# Patient Record
Sex: Female | Born: 1955 | Race: White | Hispanic: No | Marital: Single | Smoking: Never smoker
Health system: Southern US, Community
[De-identification: ages and names within clinical notes are randomized; demographics above are authoritative.]

## PROBLEM LIST (undated history)

## (undated) DIAGNOSIS — G4700 Insomnia, unspecified: Secondary | ICD-10-CM

## (undated) DIAGNOSIS — M8580 Other specified disorders of bone density and structure, unspecified site: Secondary | ICD-10-CM

## (undated) DIAGNOSIS — G039 Meningitis, unspecified: Secondary | ICD-10-CM

## (undated) DIAGNOSIS — E119 Type 2 diabetes mellitus without complications: Secondary | ICD-10-CM

## (undated) DIAGNOSIS — I10 Essential (primary) hypertension: Secondary | ICD-10-CM

## (undated) DIAGNOSIS — L409 Psoriasis, unspecified: Secondary | ICD-10-CM

## (undated) DIAGNOSIS — B20 Human immunodeficiency virus [HIV] disease: Secondary | ICD-10-CM

## (undated) DIAGNOSIS — R7303 Prediabetes: Secondary | ICD-10-CM

## (undated) DIAGNOSIS — E559 Vitamin D deficiency, unspecified: Secondary | ICD-10-CM

## (undated) DIAGNOSIS — IMO0002 Reserved for concepts with insufficient information to code with codable children: Secondary | ICD-10-CM

## (undated) DIAGNOSIS — Z21 Asymptomatic human immunodeficiency virus [HIV] infection status: Secondary | ICD-10-CM

## (undated) HISTORY — DX: Insomnia, unspecified: G47.00

## (undated) HISTORY — DX: Asymptomatic human immunodeficiency virus (hiv) infection status: Z21

## (undated) HISTORY — PX: HERNIA REPAIR: SHX51

## (undated) HISTORY — DX: Type 2 diabetes mellitus without complications: E11.9

## (undated) HISTORY — DX: Vitamin D deficiency, unspecified: E55.9

## (undated) HISTORY — DX: Human immunodeficiency virus (HIV) disease: B20

## (undated) HISTORY — DX: Psoriasis, unspecified: L40.9

## (undated) HISTORY — DX: Other specified disorders of bone density and structure, unspecified site: M85.80

## (undated) HISTORY — DX: Reserved for concepts with insufficient information to code with codable children: IMO0002

## (undated) HISTORY — DX: Essential (primary) hypertension: I10

## (undated) HISTORY — PX: CHOLECYSTECTOMY: SHX55

## (undated) HISTORY — DX: Meningitis, unspecified: G03.9

---

## 1989-06-28 ENCOUNTER — Encounter (INDEPENDENT_AMBULATORY_CARE_PROVIDER_SITE_OTHER): Payer: Self-pay | Admitting: *Deleted

## 1989-06-28 LAB — CONVERTED CEMR LAB
CD4 Count: 560 microliters
CD4 T Cell Abs: 560

## 1991-02-23 HISTORY — PX: CHOLECYSTECTOMY: SHX55

## 1997-06-24 ENCOUNTER — Encounter: Admission: RE | Admit: 1997-06-24 | Discharge: 1997-06-24 | Payer: Self-pay | Admitting: Infectious Diseases

## 1997-08-19 ENCOUNTER — Encounter: Admission: RE | Admit: 1997-08-19 | Discharge: 1997-08-19 | Payer: Self-pay | Admitting: Infectious Diseases

## 1997-10-16 ENCOUNTER — Encounter: Admission: RE | Admit: 1997-10-16 | Discharge: 1997-10-16 | Payer: Self-pay | Admitting: Infectious Diseases

## 1997-10-30 ENCOUNTER — Encounter: Admission: RE | Admit: 1997-10-30 | Discharge: 1997-10-30 | Payer: Self-pay | Admitting: Infectious Diseases

## 1997-12-25 ENCOUNTER — Ambulatory Visit (HOSPITAL_COMMUNITY): Admission: RE | Admit: 1997-12-25 | Discharge: 1997-12-25 | Payer: Self-pay | Admitting: Infectious Diseases

## 1997-12-25 ENCOUNTER — Encounter: Admission: RE | Admit: 1997-12-25 | Discharge: 1997-12-25 | Payer: Self-pay | Admitting: Infectious Diseases

## 1998-02-05 ENCOUNTER — Encounter: Admission: RE | Admit: 1998-02-05 | Discharge: 1998-02-05 | Payer: Self-pay | Admitting: Infectious Diseases

## 1998-03-24 ENCOUNTER — Encounter: Admission: RE | Admit: 1998-03-24 | Discharge: 1998-03-24 | Payer: Self-pay | Admitting: Infectious Diseases

## 1998-05-21 ENCOUNTER — Ambulatory Visit (HOSPITAL_COMMUNITY): Admission: RE | Admit: 1998-05-21 | Discharge: 1998-05-21 | Payer: Self-pay | Admitting: Infectious Diseases

## 1998-05-21 ENCOUNTER — Other Ambulatory Visit: Admission: RE | Admit: 1998-05-21 | Discharge: 1998-05-21 | Payer: Self-pay | Admitting: Infectious Diseases

## 1998-05-21 ENCOUNTER — Encounter: Admission: RE | Admit: 1998-05-21 | Discharge: 1998-05-21 | Payer: Self-pay | Admitting: Infectious Diseases

## 1998-06-30 ENCOUNTER — Encounter: Admission: RE | Admit: 1998-06-30 | Discharge: 1998-06-30 | Payer: Self-pay | Admitting: Infectious Diseases

## 1998-09-16 ENCOUNTER — Ambulatory Visit (HOSPITAL_COMMUNITY): Admission: RE | Admit: 1998-09-16 | Discharge: 1998-09-16 | Payer: Self-pay | Admitting: Infectious Diseases

## 1998-09-16 ENCOUNTER — Encounter: Admission: RE | Admit: 1998-09-16 | Discharge: 1998-09-16 | Payer: Self-pay | Admitting: Infectious Diseases

## 1998-10-15 ENCOUNTER — Encounter: Admission: RE | Admit: 1998-10-15 | Discharge: 1998-10-15 | Payer: Self-pay | Admitting: Infectious Diseases

## 1998-12-31 ENCOUNTER — Ambulatory Visit (HOSPITAL_COMMUNITY): Admission: RE | Admit: 1998-12-31 | Discharge: 1998-12-31 | Payer: Self-pay | Admitting: Infectious Diseases

## 1998-12-31 ENCOUNTER — Encounter: Admission: RE | Admit: 1998-12-31 | Discharge: 1998-12-31 | Payer: Self-pay | Admitting: Infectious Diseases

## 1999-01-12 ENCOUNTER — Encounter: Admission: RE | Admit: 1999-01-12 | Discharge: 1999-01-12 | Payer: Self-pay | Admitting: Infectious Diseases

## 1999-05-04 ENCOUNTER — Ambulatory Visit (HOSPITAL_COMMUNITY): Admission: RE | Admit: 1999-05-04 | Discharge: 1999-05-04 | Payer: Self-pay | Admitting: Infectious Diseases

## 1999-05-04 ENCOUNTER — Encounter: Admission: RE | Admit: 1999-05-04 | Discharge: 1999-05-04 | Payer: Self-pay | Admitting: Infectious Diseases

## 1999-06-10 ENCOUNTER — Encounter: Admission: RE | Admit: 1999-06-10 | Discharge: 1999-06-10 | Payer: Self-pay | Admitting: Infectious Diseases

## 1999-06-23 ENCOUNTER — Encounter: Admission: RE | Admit: 1999-06-23 | Discharge: 1999-06-23 | Payer: Self-pay

## 1999-07-07 ENCOUNTER — Ambulatory Visit (HOSPITAL_BASED_OUTPATIENT_CLINIC_OR_DEPARTMENT_OTHER): Admission: RE | Admit: 1999-07-07 | Discharge: 1999-07-07 | Payer: Self-pay

## 1999-07-17 ENCOUNTER — Inpatient Hospital Stay (HOSPITAL_COMMUNITY): Admission: AD | Admit: 1999-07-17 | Discharge: 1999-07-19 | Payer: Self-pay

## 1999-07-17 ENCOUNTER — Encounter (INDEPENDENT_AMBULATORY_CARE_PROVIDER_SITE_OTHER): Payer: Self-pay

## 1999-08-31 ENCOUNTER — Ambulatory Visit (HOSPITAL_COMMUNITY): Admission: RE | Admit: 1999-08-31 | Discharge: 1999-08-31 | Payer: Self-pay | Admitting: Infectious Diseases

## 1999-08-31 ENCOUNTER — Encounter: Admission: RE | Admit: 1999-08-31 | Discharge: 1999-08-31 | Payer: Self-pay | Admitting: Infectious Diseases

## 1999-09-14 ENCOUNTER — Encounter: Admission: RE | Admit: 1999-09-14 | Discharge: 1999-09-14 | Payer: Self-pay | Admitting: Infectious Diseases

## 1999-12-08 ENCOUNTER — Ambulatory Visit (HOSPITAL_COMMUNITY): Admission: RE | Admit: 1999-12-08 | Discharge: 1999-12-08 | Payer: Self-pay | Admitting: Infectious Diseases

## 1999-12-08 ENCOUNTER — Encounter: Admission: RE | Admit: 1999-12-08 | Discharge: 1999-12-08 | Payer: Self-pay | Admitting: Infectious Diseases

## 1999-12-28 ENCOUNTER — Encounter: Admission: RE | Admit: 1999-12-28 | Discharge: 1999-12-28 | Payer: Self-pay | Admitting: Infectious Diseases

## 2000-04-25 ENCOUNTER — Ambulatory Visit (HOSPITAL_COMMUNITY): Admission: RE | Admit: 2000-04-25 | Discharge: 2000-04-25 | Payer: Self-pay | Admitting: Infectious Diseases

## 2000-04-25 ENCOUNTER — Encounter: Admission: RE | Admit: 2000-04-25 | Discharge: 2000-04-25 | Payer: Self-pay | Admitting: Infectious Diseases

## 2000-05-09 ENCOUNTER — Encounter: Admission: RE | Admit: 2000-05-09 | Discharge: 2000-05-09 | Payer: Self-pay | Admitting: Internal Medicine

## 2000-08-24 ENCOUNTER — Ambulatory Visit (HOSPITAL_COMMUNITY): Admission: RE | Admit: 2000-08-24 | Discharge: 2000-08-24 | Payer: Self-pay | Admitting: Infectious Diseases

## 2000-08-24 ENCOUNTER — Encounter: Admission: RE | Admit: 2000-08-24 | Discharge: 2000-08-24 | Payer: Self-pay | Admitting: Infectious Diseases

## 2000-09-12 ENCOUNTER — Encounter: Admission: RE | Admit: 2000-09-12 | Discharge: 2000-09-12 | Payer: Self-pay | Admitting: Infectious Diseases

## 2000-11-07 ENCOUNTER — Encounter: Admission: RE | Admit: 2000-11-07 | Discharge: 2000-11-07 | Payer: Self-pay | Admitting: Infectious Diseases

## 2001-01-30 ENCOUNTER — Encounter: Admission: RE | Admit: 2001-01-30 | Discharge: 2001-01-30 | Payer: Self-pay | Admitting: Infectious Diseases

## 2001-01-30 ENCOUNTER — Ambulatory Visit (HOSPITAL_COMMUNITY): Admission: RE | Admit: 2001-01-30 | Discharge: 2001-01-30 | Payer: Self-pay | Admitting: Infectious Diseases

## 2001-03-13 ENCOUNTER — Encounter: Admission: RE | Admit: 2001-03-13 | Discharge: 2001-03-13 | Payer: Self-pay | Admitting: Infectious Diseases

## 2001-04-17 ENCOUNTER — Encounter: Admission: RE | Admit: 2001-04-17 | Discharge: 2001-04-17 | Payer: Self-pay | Admitting: Infectious Diseases

## 2001-06-26 ENCOUNTER — Encounter: Admission: RE | Admit: 2001-06-26 | Discharge: 2001-06-26 | Payer: Self-pay | Admitting: Infectious Diseases

## 2001-06-26 ENCOUNTER — Ambulatory Visit (HOSPITAL_COMMUNITY): Admission: RE | Admit: 2001-06-26 | Discharge: 2001-06-26 | Payer: Self-pay | Admitting: Infectious Diseases

## 2001-07-17 ENCOUNTER — Encounter: Admission: RE | Admit: 2001-07-17 | Discharge: 2001-07-17 | Payer: Self-pay | Admitting: Infectious Diseases

## 2001-08-29 ENCOUNTER — Encounter: Admission: RE | Admit: 2001-08-29 | Discharge: 2001-08-29 | Payer: Self-pay | Admitting: Internal Medicine

## 2001-09-18 ENCOUNTER — Encounter: Admission: RE | Admit: 2001-09-18 | Discharge: 2001-09-18 | Payer: Self-pay | Admitting: Infectious Diseases

## 2001-12-11 ENCOUNTER — Ambulatory Visit (HOSPITAL_COMMUNITY): Admission: RE | Admit: 2001-12-11 | Discharge: 2001-12-11 | Payer: Self-pay | Admitting: Infectious Diseases

## 2001-12-11 ENCOUNTER — Encounter: Admission: RE | Admit: 2001-12-11 | Discharge: 2001-12-11 | Payer: Self-pay | Admitting: Infectious Diseases

## 2001-12-25 ENCOUNTER — Encounter: Admission: RE | Admit: 2001-12-25 | Discharge: 2001-12-25 | Payer: Self-pay | Admitting: Infectious Diseases

## 2002-04-12 ENCOUNTER — Encounter: Admission: RE | Admit: 2002-04-12 | Discharge: 2002-04-12 | Payer: Self-pay | Admitting: Infectious Diseases

## 2002-04-30 ENCOUNTER — Encounter: Admission: RE | Admit: 2002-04-30 | Discharge: 2002-04-30 | Payer: Self-pay | Admitting: Infectious Diseases

## 2002-07-06 ENCOUNTER — Encounter: Admission: RE | Admit: 2002-07-06 | Discharge: 2002-07-06 | Payer: Self-pay | Admitting: Infectious Diseases

## 2002-07-06 ENCOUNTER — Encounter: Payer: Self-pay | Admitting: Infectious Diseases

## 2002-07-17 ENCOUNTER — Encounter: Admission: RE | Admit: 2002-07-17 | Discharge: 2002-07-17 | Payer: Self-pay | Admitting: Infectious Diseases

## 2002-07-17 ENCOUNTER — Encounter (INDEPENDENT_AMBULATORY_CARE_PROVIDER_SITE_OTHER): Payer: Self-pay | Admitting: Infectious Diseases

## 2002-07-30 ENCOUNTER — Encounter: Admission: RE | Admit: 2002-07-30 | Discharge: 2002-07-30 | Payer: Self-pay | Admitting: Infectious Diseases

## 2002-08-18 ENCOUNTER — Inpatient Hospital Stay (HOSPITAL_COMMUNITY): Admission: EM | Admit: 2002-08-18 | Discharge: 2002-08-20 | Payer: Self-pay

## 2002-10-30 ENCOUNTER — Encounter (INDEPENDENT_AMBULATORY_CARE_PROVIDER_SITE_OTHER): Payer: Self-pay | Admitting: Infectious Diseases

## 2002-10-30 ENCOUNTER — Ambulatory Visit (HOSPITAL_COMMUNITY): Admission: RE | Admit: 2002-10-30 | Discharge: 2002-10-30 | Payer: Self-pay | Admitting: Infectious Diseases

## 2002-10-30 ENCOUNTER — Encounter: Admission: RE | Admit: 2002-10-30 | Discharge: 2002-10-30 | Payer: Self-pay | Admitting: Infectious Diseases

## 2002-11-19 ENCOUNTER — Encounter: Admission: RE | Admit: 2002-11-19 | Discharge: 2002-11-19 | Payer: Self-pay | Admitting: Infectious Diseases

## 2003-03-01 ENCOUNTER — Encounter: Admission: RE | Admit: 2003-03-01 | Discharge: 2003-03-01 | Payer: Self-pay | Admitting: Infectious Diseases

## 2003-03-18 ENCOUNTER — Encounter: Admission: RE | Admit: 2003-03-18 | Discharge: 2003-03-18 | Payer: Self-pay | Admitting: Infectious Diseases

## 2003-07-08 ENCOUNTER — Encounter: Admission: RE | Admit: 2003-07-08 | Discharge: 2003-07-08 | Payer: Self-pay | Admitting: Infectious Diseases

## 2003-07-22 ENCOUNTER — Encounter: Admission: RE | Admit: 2003-07-22 | Discharge: 2003-07-22 | Payer: Self-pay | Admitting: Infectious Diseases

## 2003-11-18 ENCOUNTER — Ambulatory Visit: Payer: Self-pay | Admitting: Infectious Diseases

## 2003-11-18 ENCOUNTER — Ambulatory Visit (HOSPITAL_COMMUNITY): Admission: RE | Admit: 2003-11-18 | Discharge: 2003-11-18 | Payer: Self-pay | Admitting: Infectious Diseases

## 2003-12-16 ENCOUNTER — Ambulatory Visit: Payer: Self-pay | Admitting: Infectious Diseases

## 2004-01-15 ENCOUNTER — Ambulatory Visit: Payer: Self-pay | Admitting: Infectious Diseases

## 2004-03-16 ENCOUNTER — Ambulatory Visit (HOSPITAL_COMMUNITY): Admission: RE | Admit: 2004-03-16 | Discharge: 2004-03-16 | Payer: Self-pay | Admitting: Infectious Diseases

## 2004-03-16 ENCOUNTER — Ambulatory Visit: Payer: Self-pay | Admitting: Infectious Diseases

## 2004-04-06 ENCOUNTER — Ambulatory Visit: Payer: Self-pay | Admitting: Infectious Diseases

## 2004-06-23 ENCOUNTER — Ambulatory Visit: Payer: Self-pay | Admitting: Infectious Diseases

## 2004-07-21 ENCOUNTER — Ambulatory Visit: Payer: Self-pay | Admitting: Infectious Diseases

## 2004-07-21 ENCOUNTER — Ambulatory Visit (HOSPITAL_COMMUNITY): Admission: RE | Admit: 2004-07-21 | Discharge: 2004-07-21 | Payer: Self-pay | Admitting: Infectious Diseases

## 2004-08-10 ENCOUNTER — Ambulatory Visit: Payer: Self-pay | Admitting: Infectious Diseases

## 2004-12-14 ENCOUNTER — Ambulatory Visit: Payer: Self-pay | Admitting: Infectious Diseases

## 2004-12-14 ENCOUNTER — Ambulatory Visit (HOSPITAL_COMMUNITY): Admission: RE | Admit: 2004-12-14 | Discharge: 2004-12-14 | Payer: Self-pay | Admitting: Infectious Diseases

## 2004-12-14 ENCOUNTER — Encounter (INDEPENDENT_AMBULATORY_CARE_PROVIDER_SITE_OTHER): Payer: Self-pay | Admitting: *Deleted

## 2004-12-14 LAB — CONVERTED CEMR LAB
CD4 Count: 930 microliters
HIV 1 RNA Quant: 49 copies/mL

## 2004-12-28 ENCOUNTER — Ambulatory Visit: Payer: Self-pay | Admitting: Infectious Diseases

## 2005-05-31 ENCOUNTER — Encounter (INDEPENDENT_AMBULATORY_CARE_PROVIDER_SITE_OTHER): Payer: Self-pay | Admitting: *Deleted

## 2005-05-31 ENCOUNTER — Encounter: Admission: RE | Admit: 2005-05-31 | Discharge: 2005-05-31 | Payer: Self-pay | Admitting: Infectious Diseases

## 2005-05-31 ENCOUNTER — Ambulatory Visit: Payer: Self-pay | Admitting: Infectious Diseases

## 2005-05-31 LAB — CONVERTED CEMR LAB
CD4 Count: 760 microliters
HIV 1 RNA Quant: 49 copies/mL

## 2005-06-21 ENCOUNTER — Ambulatory Visit: Payer: Self-pay | Admitting: Infectious Diseases

## 2005-11-01 ENCOUNTER — Encounter: Admission: RE | Admit: 2005-11-01 | Discharge: 2005-11-01 | Payer: Self-pay | Admitting: Infectious Diseases

## 2005-11-01 ENCOUNTER — Ambulatory Visit: Payer: Self-pay | Admitting: Infectious Diseases

## 2005-11-01 ENCOUNTER — Encounter (INDEPENDENT_AMBULATORY_CARE_PROVIDER_SITE_OTHER): Payer: Self-pay | Admitting: *Deleted

## 2005-11-01 LAB — CONVERTED CEMR LAB
CD4 Count: 730 microliters
HIV 1 RNA Quant: 91 copies/mL

## 2005-11-22 ENCOUNTER — Ambulatory Visit: Payer: Self-pay | Admitting: Infectious Diseases

## 2005-11-24 DIAGNOSIS — B20 Human immunodeficiency virus [HIV] disease: Secondary | ICD-10-CM | POA: Insufficient documentation

## 2005-11-25 DIAGNOSIS — B372 Candidiasis of skin and nail: Secondary | ICD-10-CM | POA: Insufficient documentation

## 2005-11-25 DIAGNOSIS — M549 Dorsalgia, unspecified: Secondary | ICD-10-CM | POA: Insufficient documentation

## 2005-11-25 DIAGNOSIS — E669 Obesity, unspecified: Secondary | ICD-10-CM | POA: Insufficient documentation

## 2006-04-18 ENCOUNTER — Ambulatory Visit: Payer: Self-pay | Admitting: Infectious Diseases

## 2006-04-18 ENCOUNTER — Encounter: Admission: RE | Admit: 2006-04-18 | Discharge: 2006-04-18 | Payer: Self-pay | Admitting: Infectious Diseases

## 2006-04-18 ENCOUNTER — Encounter (INDEPENDENT_AMBULATORY_CARE_PROVIDER_SITE_OTHER): Payer: Self-pay | Admitting: *Deleted

## 2006-04-18 LAB — CONVERTED CEMR LAB
ALT: 17 units/L (ref 0–35)
AST: 15 units/L (ref 0–37)
Albumin: 4.3 g/dL (ref 3.5–5.2)
Alkaline Phosphatase: 114 units/L (ref 39–117)
BUN: 18 mg/dL (ref 6–23)
Basophils Absolute: 0 10*3/uL (ref 0.0–0.1)
Basophils Relative: 0 % (ref 0–1)
Bilirubin Urine: NEGATIVE
CD4 % Helper T Cell: 45 %
CD4 Count: 790 microliters
CO2: 28 meq/L (ref 19–32)
Calcium: 9.2 mg/dL (ref 8.4–10.5)
Chloride: 103 meq/L (ref 96–112)
Cholesterol: 200 mg/dL (ref 0–200)
Creatinine, Ser: 0.71 mg/dL (ref 0.40–1.20)
Eosinophils Absolute: 0.2 10*3/uL (ref 0.0–0.7)
Eosinophils Relative: 3 % (ref 0–5)
Glucose, Bld: 104 mg/dL — ABNORMAL HIGH (ref 70–99)
HCT: 40.2 % (ref 36.0–46.0)
HDL: 59 mg/dL (ref >39)
HIV 1 RNA Quant: 93 copies/mL — ABNORMAL HIGH (ref <50)
HIV-1 RNA Quant, Log: 1.97 — ABNORMAL HIGH (ref <1.70)
Hemoglobin, Urine: NEGATIVE
Hemoglobin: 12.7 g/dL (ref 12.0–15.0)
Ketones, ur: NEGATIVE mg/dL
LDL Cholesterol: 131 mg/dL — ABNORMAL HIGH (ref 0–99)
Leukocytes, UA: NEGATIVE
Lymphocytes Relative: 27 % (ref 12–46)
Lymphs Abs: 1.8 10*3/uL (ref 0.7–3.3)
MCHC: 31.6 g/dL (ref 30.0–36.0)
MCV: 92.8 fL (ref 78.0–100.0)
Monocytes Absolute: 0.5 10*3/uL (ref 0.2–0.7)
Monocytes Relative: 8 % (ref 3–11)
Neutro Abs: 4.3 10*3/uL (ref 1.7–7.7)
Neutrophils Relative %: 62 % (ref 43–77)
Nitrite: NEGATIVE
Pap Smear: NORMAL
Platelets: 298 10*3/uL (ref 150–400)
Potassium: 4.7 meq/L (ref 3.5–5.3)
Protein, ur: NEGATIVE mg/dL
RBC: 4.33 M/uL (ref 3.87–5.11)
RDW: 13.3 % (ref 11.5–14.0)
Sodium: 139 meq/L (ref 135–145)
Specific Gravity, Urine: 1.002 — ABNORMAL LOW (ref 1.005–1.03)
Total Bilirubin: 0.3 mg/dL (ref 0.3–1.2)
Total CHOL/HDL Ratio: 3.4
Total Protein: 7.3 g/dL (ref 6.0–8.3)
Triglycerides: 50 mg/dL (ref <150)
Urine Glucose: NEGATIVE mg/dL
Urobilinogen, UA: 0.2 (ref 0.0–1.0)
VLDL: 10 mg/dL (ref 0–40)
WBC: 6.9 10*3/uL (ref 4.0–10.5)
pH: 7 (ref 5.0–8.0)

## 2006-05-01 ENCOUNTER — Encounter (INDEPENDENT_AMBULATORY_CARE_PROVIDER_SITE_OTHER): Payer: Self-pay | Admitting: *Deleted

## 2006-05-02 ENCOUNTER — Ambulatory Visit: Payer: Self-pay | Admitting: Infectious Diseases

## 2006-08-08 ENCOUNTER — Ambulatory Visit: Payer: Self-pay | Admitting: Infectious Diseases

## 2006-11-11 ENCOUNTER — Encounter: Admission: RE | Admit: 2006-11-11 | Discharge: 2006-11-11 | Payer: Self-pay | Admitting: Internal Medicine

## 2006-11-11 ENCOUNTER — Ambulatory Visit: Payer: Self-pay | Admitting: Internal Medicine

## 2006-11-11 LAB — CONVERTED CEMR LAB
ALT: 19 units/L (ref 0–35)
AST: 16 units/L (ref 0–37)
Albumin: 3.7 g/dL (ref 3.5–5.2)
Alkaline Phosphatase: 100 units/L (ref 39–117)
BUN: 16 mg/dL (ref 6–23)
Basophils Absolute: 0 10*3/uL (ref 0.0–0.1)
Basophils Relative: 0 % (ref 0–1)
CO2: 24 meq/L (ref 19–32)
Calcium: 8.9 mg/dL (ref 8.4–10.5)
Chloride: 104 meq/L (ref 96–112)
Creatinine, Ser: 0.62 mg/dL (ref 0.40–1.20)
Eosinophils Absolute: 0.2 10*3/uL (ref 0.0–0.7)
Eosinophils Relative: 2 % (ref 0–5)
Glucose, Bld: 130 mg/dL — ABNORMAL HIGH (ref 70–99)
HCT: 39.3 % (ref 36.0–46.0)
HIV 1 RNA Quant: <50 copies/mL (ref <50)
HIV-1 RNA Quant, Log: <1.7 (ref <1.70)
Hemoglobin: 12.6 g/dL (ref 12.0–15.0)
Lymphocytes Relative: 21 % (ref 12–46)
Lymphs Abs: 1.7 10*3/uL (ref 0.7–3.3)
MCHC: 32.1 g/dL (ref 30.0–36.0)
MCV: 92 fL (ref 78.0–100.0)
Monocytes Absolute: 0.6 10*3/uL (ref 0.2–0.7)
Monocytes Relative: 7 % (ref 3–11)
Neutro Abs: 5.5 10*3/uL (ref 1.7–7.7)
Neutrophils Relative %: 69 % (ref 43–77)
Platelets: 280 10*3/uL (ref 150–400)
Potassium: 4.3 meq/L (ref 3.5–5.3)
RBC: 4.27 M/uL (ref 3.87–5.11)
RDW: 14.5 % — ABNORMAL HIGH (ref 11.5–14.0)
Sodium: 140 meq/L (ref 135–145)
Total Bilirubin: 0.3 mg/dL (ref 0.3–1.2)
Total Protein: 6.7 g/dL (ref 6.0–8.3)
WBC: 7.9 10*3/uL (ref 4.0–10.5)

## 2006-11-28 ENCOUNTER — Ambulatory Visit: Payer: Self-pay | Admitting: Infectious Disease

## 2006-11-28 DIAGNOSIS — L408 Other psoriasis: Secondary | ICD-10-CM | POA: Insufficient documentation

## 2006-12-02 ENCOUNTER — Encounter: Payer: Self-pay | Admitting: Infectious Disease

## 2006-12-22 ENCOUNTER — Encounter: Payer: Self-pay | Admitting: Infectious Disease

## 2007-02-06 ENCOUNTER — Encounter (INDEPENDENT_AMBULATORY_CARE_PROVIDER_SITE_OTHER): Payer: Self-pay | Admitting: *Deleted

## 2007-02-21 ENCOUNTER — Encounter: Payer: Self-pay | Admitting: Infectious Disease

## 2007-02-23 DIAGNOSIS — IMO0002 Reserved for concepts with insufficient information to code with codable children: Secondary | ICD-10-CM

## 2007-02-23 DIAGNOSIS — R87619 Unspecified abnormal cytological findings in specimens from cervix uteri: Secondary | ICD-10-CM

## 2007-02-23 HISTORY — DX: Unspecified abnormal cytological findings in specimens from cervix uteri: R87.619

## 2007-02-23 HISTORY — DX: Reserved for concepts with insufficient information to code with codable children: IMO0002

## 2007-05-16 ENCOUNTER — Encounter: Admission: RE | Admit: 2007-05-16 | Discharge: 2007-05-16 | Payer: Self-pay | Admitting: Infectious Disease

## 2007-05-16 ENCOUNTER — Ambulatory Visit: Payer: Self-pay | Admitting: Infectious Disease

## 2007-05-16 LAB — CONVERTED CEMR LAB
ALT: 25 units/L (ref 0–35)
AST: 23 units/L (ref 0–37)
Albumin: 4.3 g/dL (ref 3.5–5.2)
Alkaline Phosphatase: 121 units/L — ABNORMAL HIGH (ref 39–117)
BUN: 11 mg/dL (ref 6–23)
CO2: 25 meq/L (ref 19–32)
Calcium: 9.3 mg/dL (ref 8.4–10.5)
Chloride: 104 meq/L (ref 96–112)
Cholesterol: 173 mg/dL (ref 0–200)
Creatinine, Ser: 0.58 mg/dL (ref 0.40–1.20)
Glucose, Bld: 103 mg/dL — ABNORMAL HIGH (ref 70–99)
HCT: 39.3 % (ref 36.0–46.0)
HDL: 49 mg/dL (ref >39)
HIV 1 RNA Quant: 156 copies/mL — ABNORMAL HIGH (ref <50)
HIV-1 RNA Quant, Log: 2.19 — ABNORMAL HIGH (ref <1.70)
Hemoglobin: 13 g/dL (ref 12.0–15.0)
LDL Cholesterol: 114 mg/dL — ABNORMAL HIGH (ref 0–99)
MCHC: 33.1 g/dL (ref 30.0–36.0)
MCV: 92.7 fL (ref 78.0–100.0)
Platelets: 310 10*3/uL (ref 150–400)
Potassium: 4.7 meq/L (ref 3.5–5.3)
RBC: 4.24 M/uL (ref 3.87–5.11)
RDW: 14.3 % (ref 11.5–15.5)
Sodium: 142 meq/L (ref 135–145)
Total Bilirubin: 0.3 mg/dL (ref 0.3–1.2)
Total CHOL/HDL Ratio: 3.5
Total Protein: 6.9 g/dL (ref 6.0–8.3)
Triglycerides: 48 mg/dL (ref <150)
VLDL: 10 mg/dL (ref 0–40)
WBC: 7.3 10*3/uL (ref 4.0–10.5)

## 2007-05-29 ENCOUNTER — Ambulatory Visit: Payer: Self-pay | Admitting: Infectious Disease

## 2007-06-09 ENCOUNTER — Encounter: Payer: Self-pay | Admitting: Infectious Disease

## 2007-06-09 ENCOUNTER — Ambulatory Visit: Payer: Self-pay | Admitting: Infectious Disease

## 2007-06-20 DIAGNOSIS — R87612 Low grade squamous intraepithelial lesion on cytologic smear of cervix (LGSIL): Secondary | ICD-10-CM | POA: Insufficient documentation

## 2007-06-23 ENCOUNTER — Encounter: Payer: Self-pay | Admitting: Infectious Disease

## 2007-06-30 ENCOUNTER — Ambulatory Visit: Payer: Self-pay | Admitting: *Deleted

## 2007-06-30 ENCOUNTER — Other Ambulatory Visit: Admission: RE | Admit: 2007-06-30 | Discharge: 2007-06-30 | Payer: Self-pay | Admitting: Obstetrics & Gynecology

## 2007-06-30 ENCOUNTER — Encounter: Payer: Self-pay | Admitting: Infectious Disease

## 2007-07-14 ENCOUNTER — Ambulatory Visit: Payer: Self-pay | Admitting: Gynecology

## 2007-11-20 ENCOUNTER — Ambulatory Visit: Payer: Self-pay | Admitting: Infectious Disease

## 2007-11-20 LAB — CONVERTED CEMR LAB
ALT: 37 units/L — ABNORMAL HIGH (ref 0–35)
AST: 31 units/L (ref 0–37)
Albumin: 4.6 g/dL (ref 3.5–5.2)
Alkaline Phosphatase: 130 units/L — ABNORMAL HIGH (ref 39–117)
BUN: 14 mg/dL (ref 6–23)
Basophils Absolute: 0 10*3/uL (ref 0.0–0.1)
Basophils Relative: 1 % (ref 0–1)
CO2: 26 meq/L (ref 19–32)
Calcium: 9.6 mg/dL (ref 8.4–10.5)
Chloride: 102 meq/L (ref 96–112)
Cholesterol: 191 mg/dL (ref 0–200)
Creatinine, Ser: 0.9 mg/dL (ref 0.40–1.20)
Eosinophils Absolute: 0.1 10*3/uL (ref 0.0–0.7)
Eosinophils Relative: 2 % (ref 0–5)
Glucose, Bld: 108 mg/dL — ABNORMAL HIGH (ref 70–99)
HCT: 41.8 % (ref 36.0–46.0)
HDL: 49 mg/dL (ref >39)
HIV 1 RNA Quant: 141 copies/mL — ABNORMAL HIGH (ref <50)
HIV-1 RNA Quant, Log: 2.15 — ABNORMAL HIGH (ref <1.70)
Hemoglobin: 14 g/dL (ref 12.0–15.0)
Hep A Total Ab: NEGATIVE
Hgb A1c MFr Bld: 5.2 %
LDL Cholesterol: 129 mg/dL — ABNORMAL HIGH (ref 0–99)
Lymphocytes Relative: 29 % (ref 12–46)
Lymphs Abs: 1.8 10*3/uL (ref 0.7–4.0)
MCHC: 33.5 g/dL (ref 30.0–36.0)
MCV: 95 fL (ref 78.0–100.0)
Monocytes Absolute: 0.3 10*3/uL (ref 0.1–1.0)
Monocytes Relative: 6 % (ref 3–12)
Neutro Abs: 3.8 10*3/uL (ref 1.7–7.7)
Neutrophils Relative %: 62 % (ref 43–77)
Platelets: 318 10*3/uL (ref 150–400)
Potassium: 4.3 meq/L (ref 3.5–5.3)
RBC: 4.4 M/uL (ref 3.87–5.11)
RDW: 13.4 % (ref 11.5–15.5)
Sodium: 140 meq/L (ref 135–145)
Total Bilirubin: 0.4 mg/dL (ref 0.3–1.2)
Total CHOL/HDL Ratio: 3.9
Total Protein: 7.5 g/dL (ref 6.0–8.3)
Triglycerides: 63 mg/dL (ref <150)
VLDL: 13 mg/dL (ref 0–40)
WBC: 6.1 10*3/uL (ref 4.0–10.5)

## 2007-12-05 ENCOUNTER — Ambulatory Visit: Payer: Self-pay | Admitting: Infectious Diseases

## 2008-01-26 ENCOUNTER — Ambulatory Visit: Payer: Self-pay | Admitting: Obstetrics and Gynecology

## 2008-01-26 ENCOUNTER — Encounter (INDEPENDENT_AMBULATORY_CARE_PROVIDER_SITE_OTHER): Payer: Self-pay | Admitting: Family Medicine

## 2008-03-07 ENCOUNTER — Ambulatory Visit (HOSPITAL_COMMUNITY): Admission: RE | Admit: 2008-03-07 | Discharge: 2008-03-07 | Payer: Self-pay | Admitting: Obstetrics & Gynecology

## 2008-03-07 IMAGING — MG MM DIGITAL SCREENING BILAT
5 series · 5 of 5 positions shown · non-contrast
Comparison: none

DG SCREEN MAMMOGRAM BILATERAL
Bilateral CC and MLO view(s) were taken.
Technologist: NATLVLDAD.(NATLVLDAD)(M)

DIGITAL SCREENING MAMMOGRAM WITH CAD:
There are scattered fibroglandular densities.  No masses or malignant type calcifications are 
identified.

[R CC]
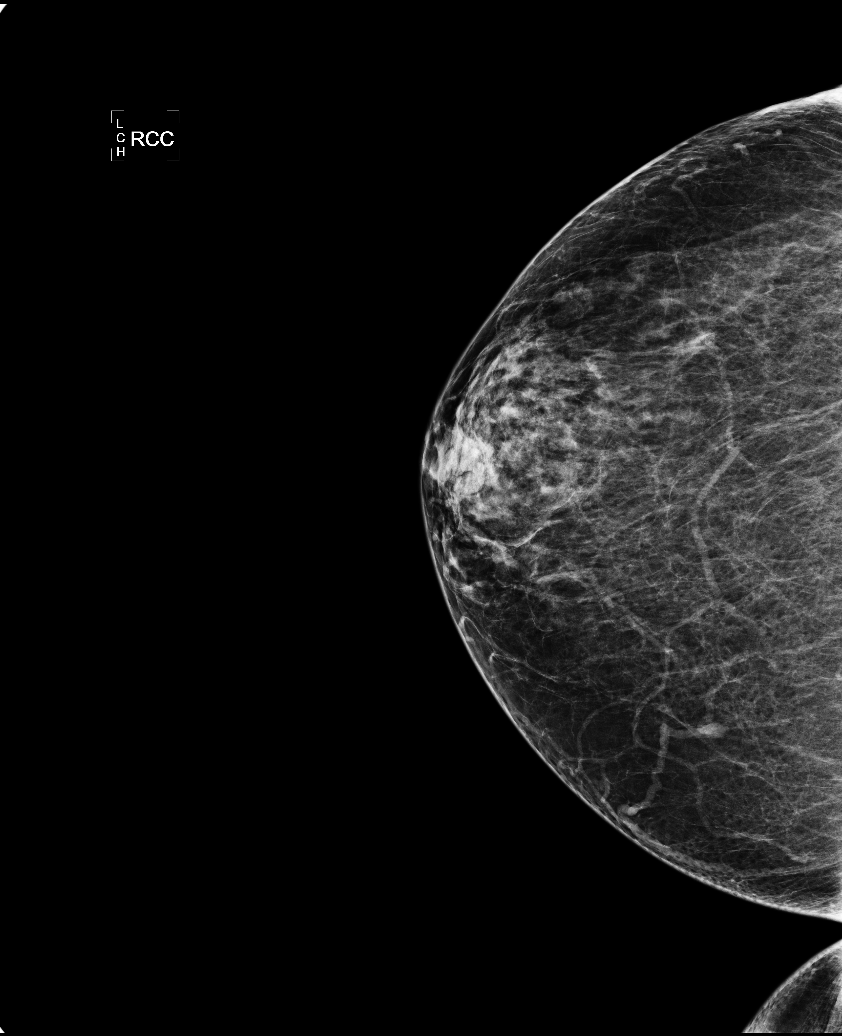

[R MLO (1 of 2)]
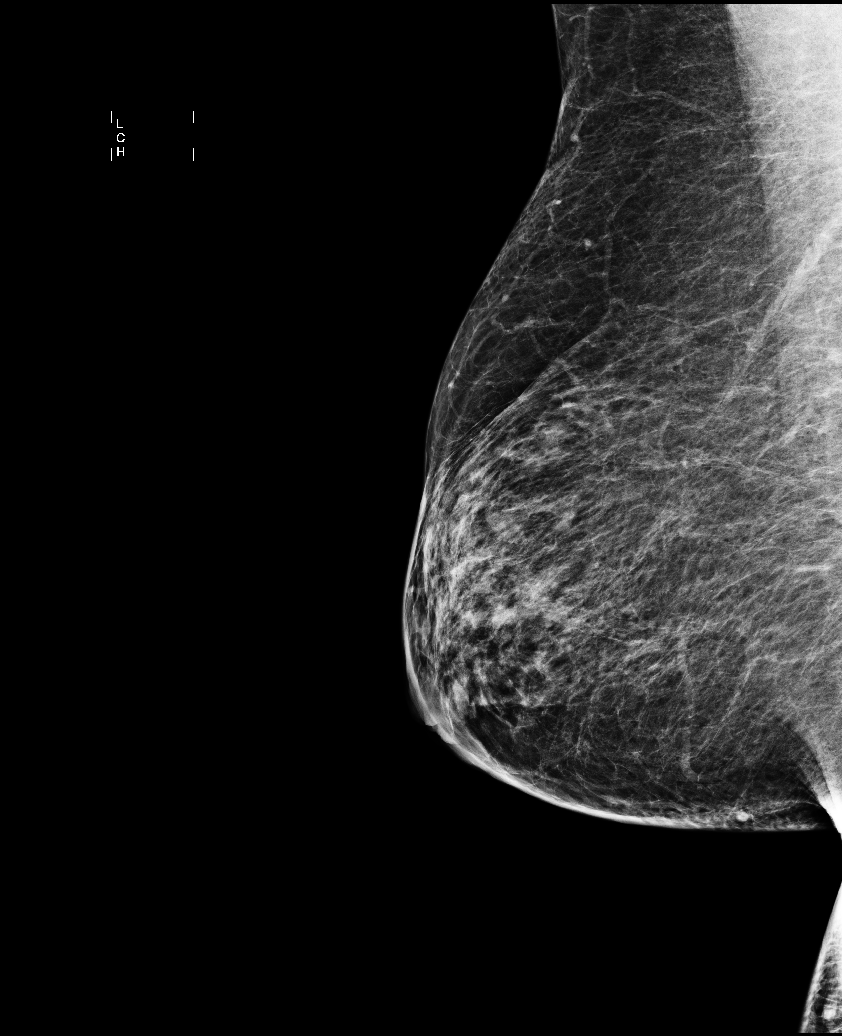

[L CC]
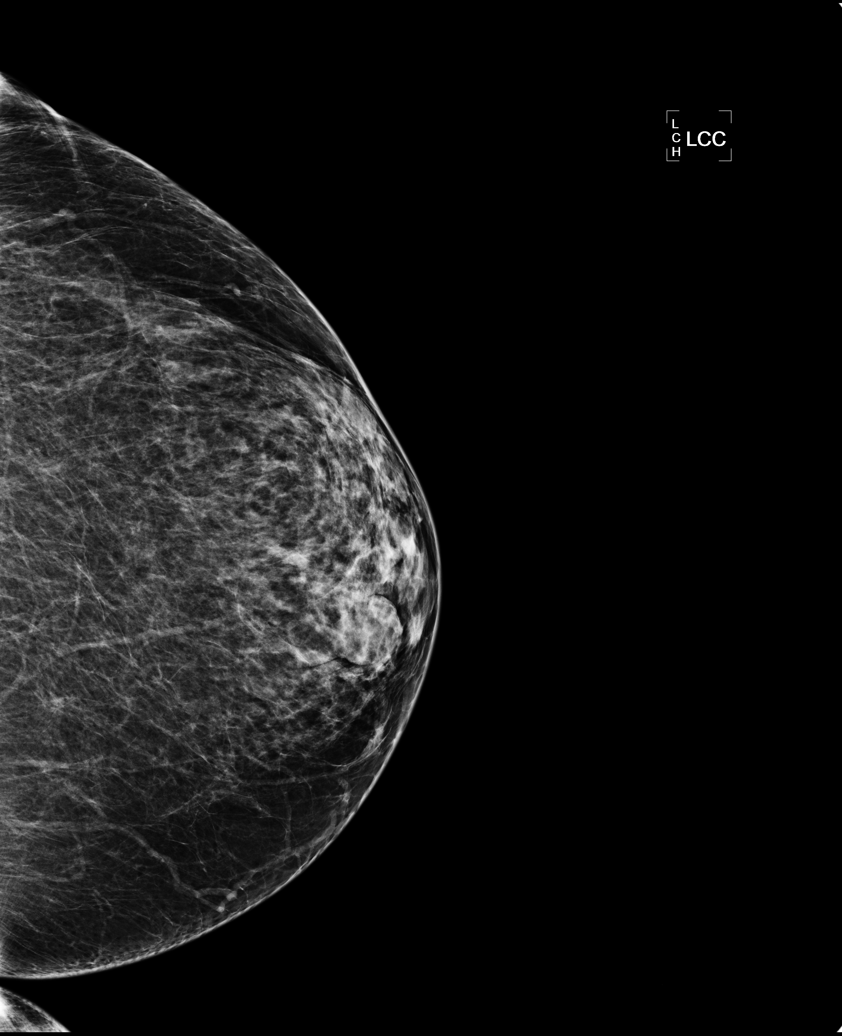

[L MLO]
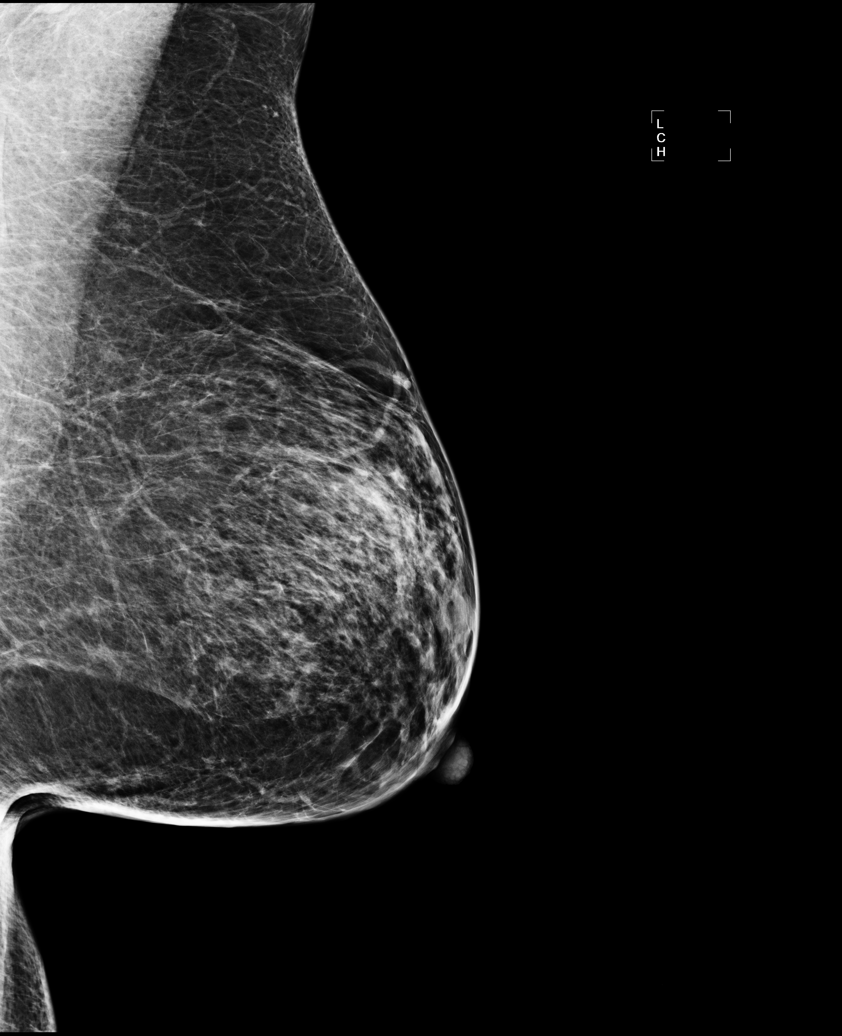

[R MLO (2 of 2)]
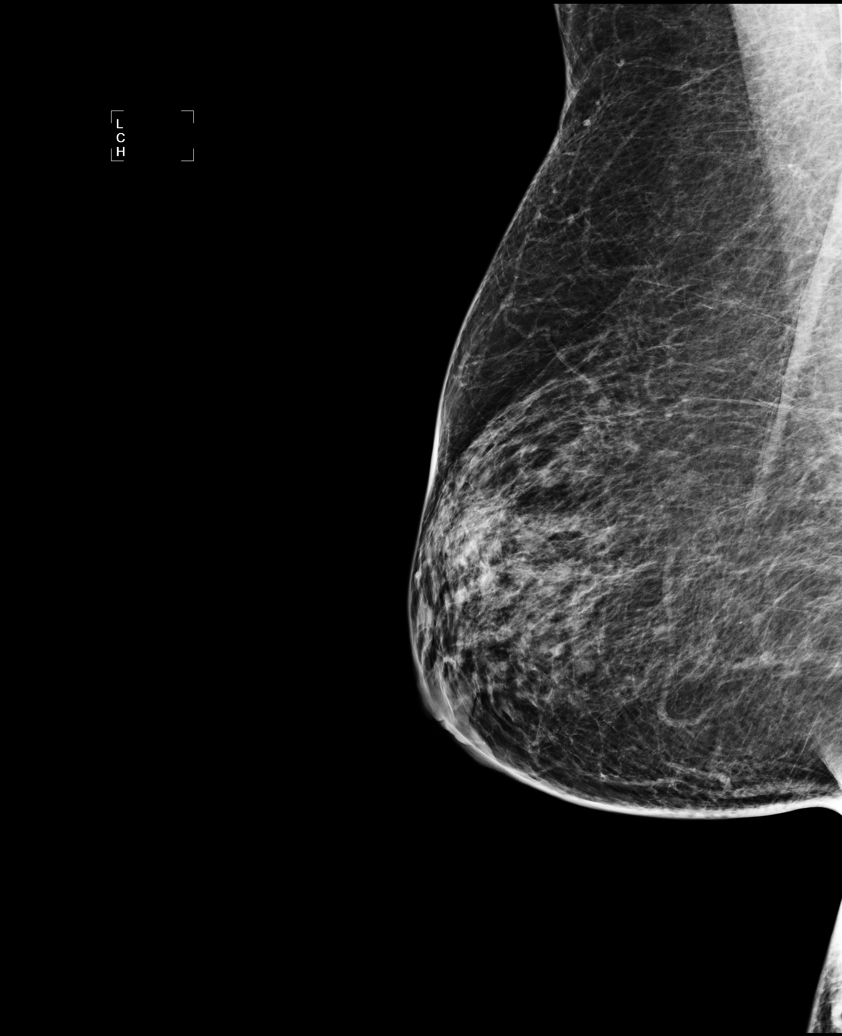

[5 of 5 positions shown; findings below may reference images not displayed]

IMPRESSION: No specific mammographic evidence of malignancy.  Next screening mammogram is recommended in one 
year.

ASSESSMENT: Negative - BI-RADS 1

Screening mammogram in 1 year.
ANALYZED BY COMPUTER AIDED DETECTION. , THIS PROCEDURE WAS A DIGITAL MAMMOGRAM.

## 2008-05-28 ENCOUNTER — Ambulatory Visit: Payer: Self-pay | Admitting: Infectious Diseases

## 2008-05-28 LAB — CONVERTED CEMR LAB
ALT: 44 units/L — ABNORMAL HIGH (ref 0–35)
AST: 36 units/L (ref 0–37)
Albumin: 4.4 g/dL (ref 3.5–5.2)
Alkaline Phosphatase: 97 units/L (ref 39–117)
BUN: 8 mg/dL (ref 6–23)
Basophils Absolute: 0 10*3/uL (ref 0.0–0.1)
Basophils Relative: 1 % (ref 0–1)
CO2: 27 meq/L (ref 19–32)
Calcium: 9.5 mg/dL (ref 8.4–10.5)
Chloride: 101 meq/L (ref 96–112)
Creatinine, Ser: 0.76 mg/dL (ref 0.40–1.20)
Eosinophils Absolute: 0.1 10*3/uL (ref 0.0–0.7)
Eosinophils Relative: 2 % (ref 0–5)
GFR calc Af Amer: >60 mL/min (ref >60)
GFR calc non Af Amer: >60 mL/min (ref >60)
Glucose, Bld: 90 mg/dL (ref 70–99)
HCT: 39.3 % (ref 36.0–46.0)
HIV 1 RNA Quant: 180 copies/mL — ABNORMAL HIGH (ref <48)
HIV-1 RNA Quant, Log: 2.26 — ABNORMAL HIGH (ref <1.68)
Hemoglobin: 12.8 g/dL (ref 12.0–15.0)
Lymphocytes Relative: 26 % (ref 12–46)
Lymphs Abs: 1.2 10*3/uL (ref 0.7–4.0)
MCHC: 32.6 g/dL (ref 30.0–36.0)
MCV: 93.8 fL (ref 78.0–100.0)
Monocytes Absolute: 0.2 10*3/uL (ref 0.1–1.0)
Monocytes Relative: 5 % (ref 3–12)
Neutro Abs: 3.1 10*3/uL (ref 1.7–7.7)
Neutrophils Relative %: 67 % (ref 43–77)
Platelets: 271 10*3/uL (ref 150–400)
Potassium: 4.7 meq/L (ref 3.5–5.3)
RBC: 4.19 M/uL (ref 3.87–5.11)
RDW: 13.1 % (ref 11.5–15.5)
Sodium: 138 meq/L (ref 135–145)
Total Bilirubin: 0.4 mg/dL (ref 0.3–1.2)
Total Protein: 7 g/dL (ref 6.0–8.3)
WBC: 4.7 10*3/uL (ref 4.0–10.5)

## 2008-06-11 ENCOUNTER — Ambulatory Visit: Payer: Self-pay | Admitting: Infectious Diseases

## 2008-06-11 LAB — CONVERTED CEMR LAB
Cholesterol, target level: 200 mg/dL
HDL goal, serum: 40 mg/dL
LDL Goal: 160 mg/dL

## 2008-06-27 ENCOUNTER — Encounter: Payer: Self-pay | Admitting: Infectious Disease

## 2008-07-05 ENCOUNTER — Telehealth: Payer: Self-pay | Admitting: Infectious Diseases

## 2008-08-15 ENCOUNTER — Encounter (INDEPENDENT_AMBULATORY_CARE_PROVIDER_SITE_OTHER): Payer: Self-pay | Admitting: *Deleted

## 2008-08-15 ENCOUNTER — Encounter: Payer: Self-pay | Admitting: Obstetrics & Gynecology

## 2008-08-15 ENCOUNTER — Ambulatory Visit: Payer: Self-pay | Admitting: Obstetrics and Gynecology

## 2008-08-15 LAB — CONVERTED CEMR LAB: Pap Smear: NEGATIVE

## 2009-02-24 ENCOUNTER — Ambulatory Visit: Payer: Self-pay | Admitting: Infectious Diseases

## 2009-02-24 LAB — CONVERTED CEMR LAB
ALT: 27 units/L (ref 0–35)
AST: 24 units/L (ref 0–37)
Albumin: 4.4 g/dL (ref 3.5–5.2)
Alkaline Phosphatase: 105 units/L (ref 39–117)
BUN: 13 mg/dL (ref 6–23)
Basophils Absolute: 0 10*3/uL (ref 0.0–0.1)
Basophils Relative: 0 % (ref 0–1)
CO2: 26 meq/L (ref 19–32)
Calcium: 9.5 mg/dL (ref 8.4–10.5)
Chloride: 101 meq/L (ref 96–112)
Cholesterol: 201 mg/dL — ABNORMAL HIGH (ref 0–200)
Creatinine, Ser: 0.68 mg/dL (ref 0.40–1.20)
Eosinophils Absolute: 0.1 10*3/uL (ref 0.0–0.7)
Eosinophils Relative: 3 % (ref 0–5)
Glucose, Bld: 103 mg/dL — ABNORMAL HIGH (ref 70–99)
HCT: 38.6 % (ref 36.0–46.0)
HDL: 61 mg/dL (ref >39)
HIV 1 RNA Quant: 108 copies/mL — ABNORMAL HIGH (ref <48)
HIV-1 RNA Quant, Log: 2.03 — ABNORMAL HIGH (ref <1.68)
Hemoglobin: 12.9 g/dL (ref 12.0–15.0)
LDL Cholesterol: 125 mg/dL — ABNORMAL HIGH (ref 0–99)
Lymphocytes Relative: 26 % (ref 12–46)
Lymphs Abs: 1.3 10*3/uL (ref 0.7–4.0)
MCHC: 33.4 g/dL (ref 30.0–36.0)
MCV: 95.3 fL (ref >=78.0)
Monocytes Absolute: 0.4 10*3/uL (ref 0.1–1.0)
Monocytes Relative: 7 % (ref 3–12)
Neutro Abs: 3.2 10*3/uL (ref 1.7–7.7)
Neutrophils Relative %: 64 % (ref 43–77)
Platelets: 270 10*3/uL (ref 150–400)
Potassium: 4.3 meq/L (ref 3.5–5.3)
RBC: 4.05 M/uL (ref 3.87–5.11)
RDW: 13.1 % (ref 11.5–15.5)
Sodium: 142 meq/L (ref 135–145)
Total Bilirubin: 0.4 mg/dL (ref 0.3–1.2)
Total CHOL/HDL Ratio: 3.3
Total Protein: 7.2 g/dL (ref 6.0–8.3)
Triglycerides: 75 mg/dL (ref <150)
VLDL: 15 mg/dL (ref 0–40)
WBC: 5 10*3/uL (ref 4.0–10.5)

## 2009-03-10 ENCOUNTER — Ambulatory Visit (HOSPITAL_COMMUNITY): Admission: RE | Admit: 2009-03-10 | Discharge: 2009-03-10 | Payer: Self-pay | Admitting: Infectious Diseases

## 2009-03-10 IMAGING — MG MM DIGITAL SCREENING
5 series · 5 of 5 positions shown · non-contrast
Comparison: Prior studies.

DG SCREEN MAMMOGRAM BILATERAL
Bilateral CC and MLO view(s) were taken.
Technologist: TIGER, RT, RM

DIGITAL SCREENING MAMMOGRAM WITH CAD:

[R CC]
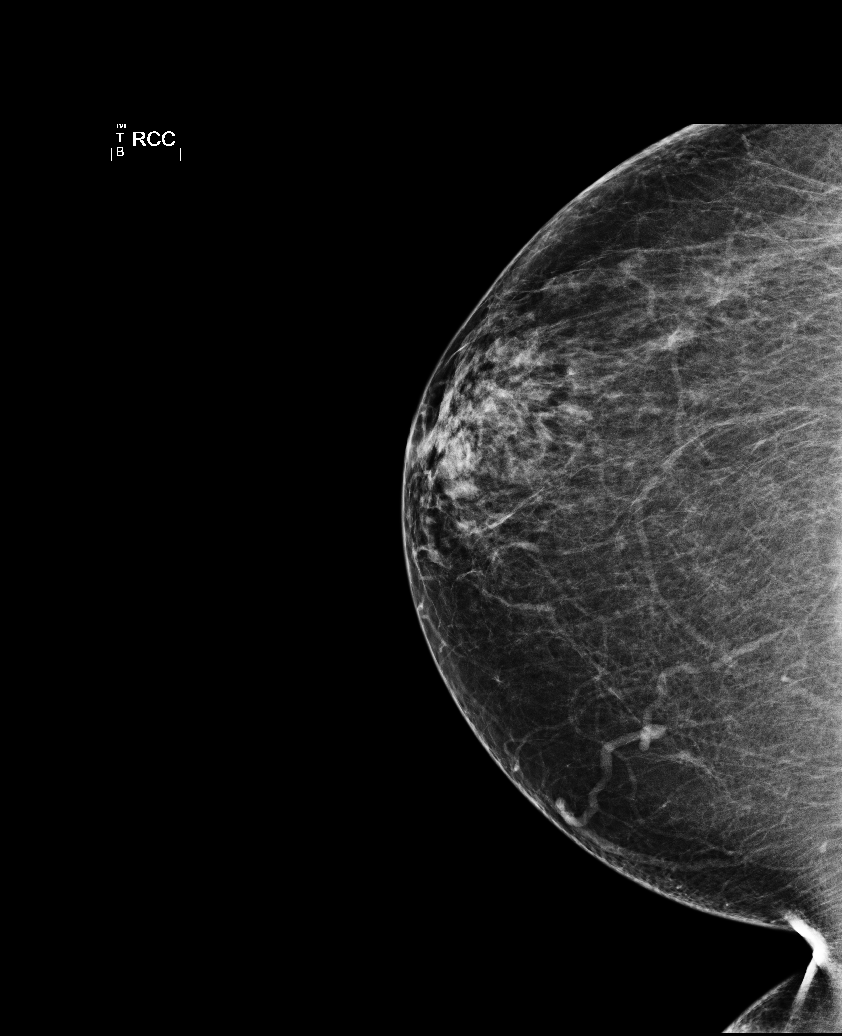

[R MLO]
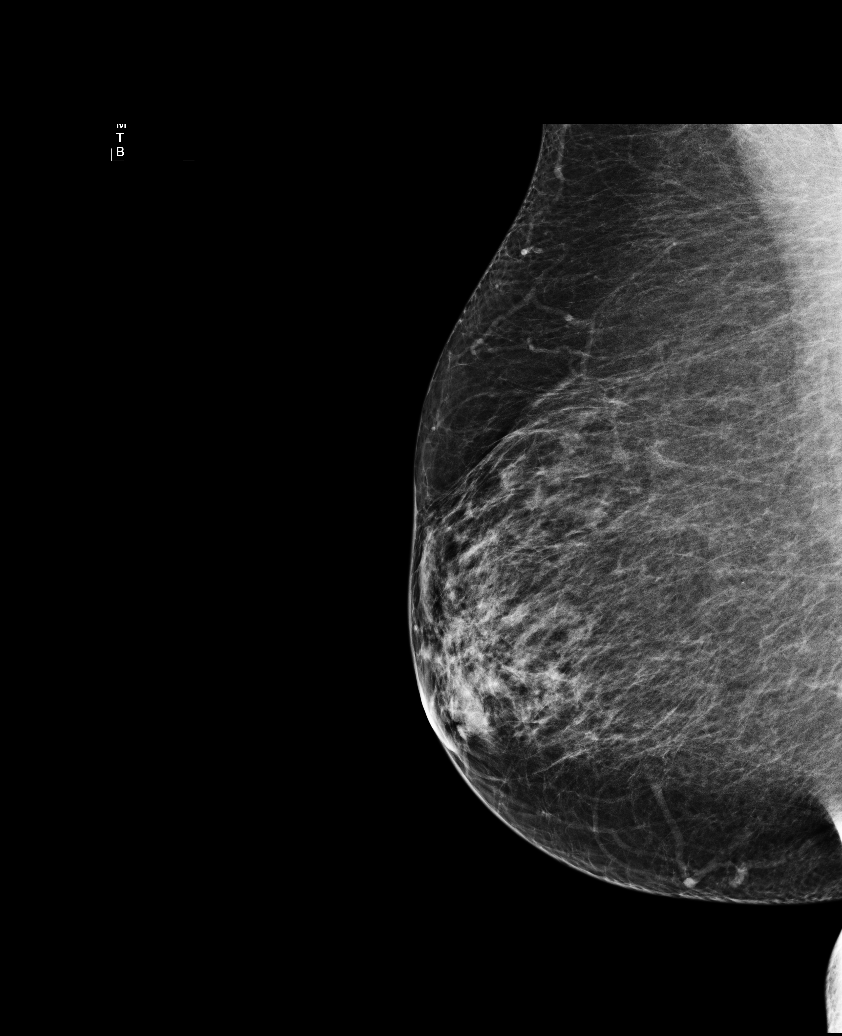

[L CC]
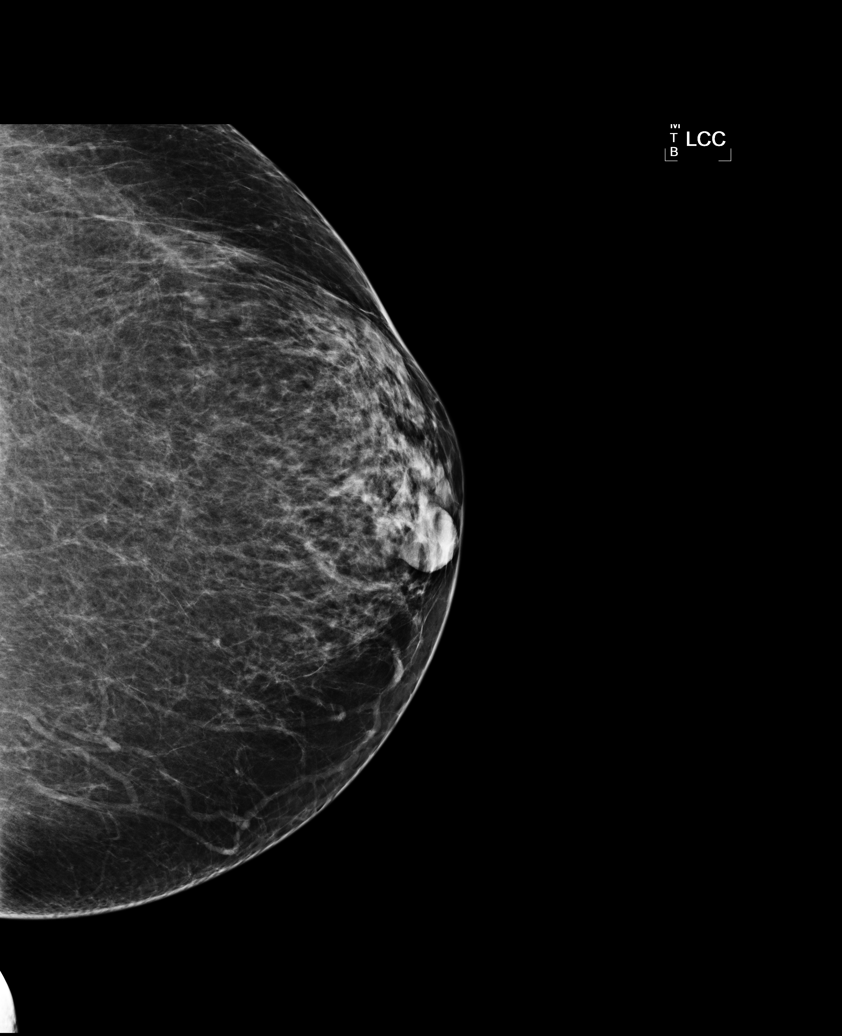

[L MLO (1 of 2)]
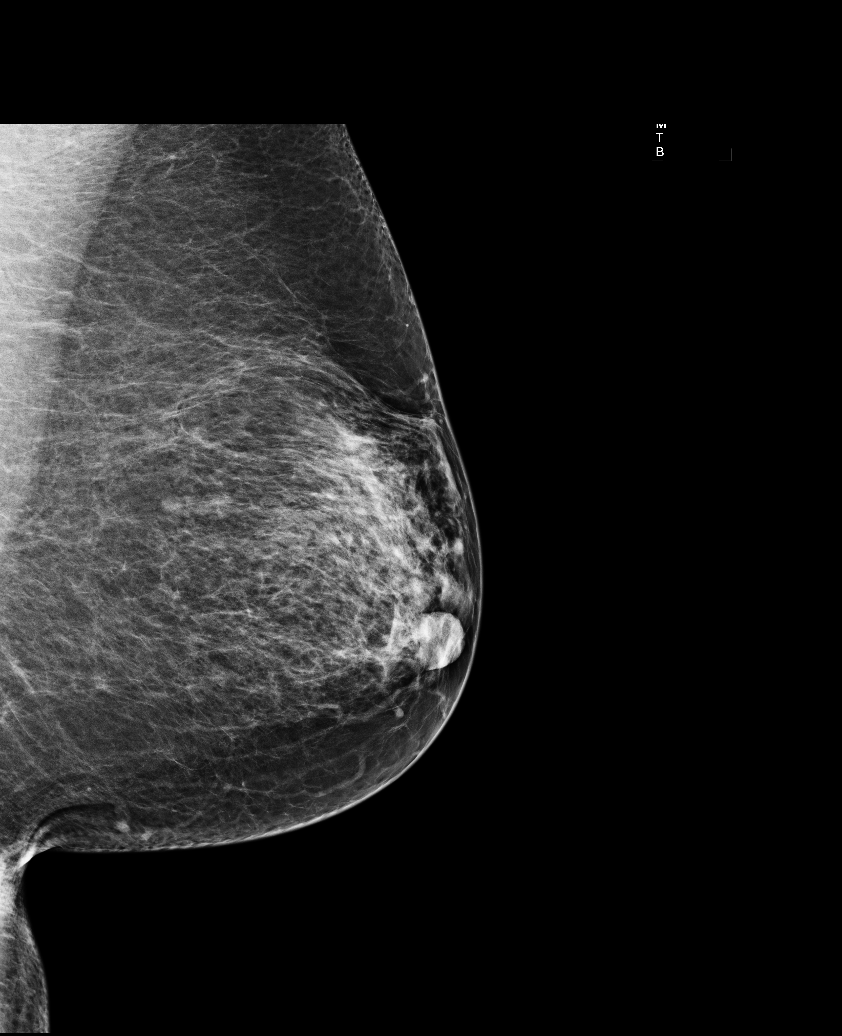

[L MLO (2 of 2)]
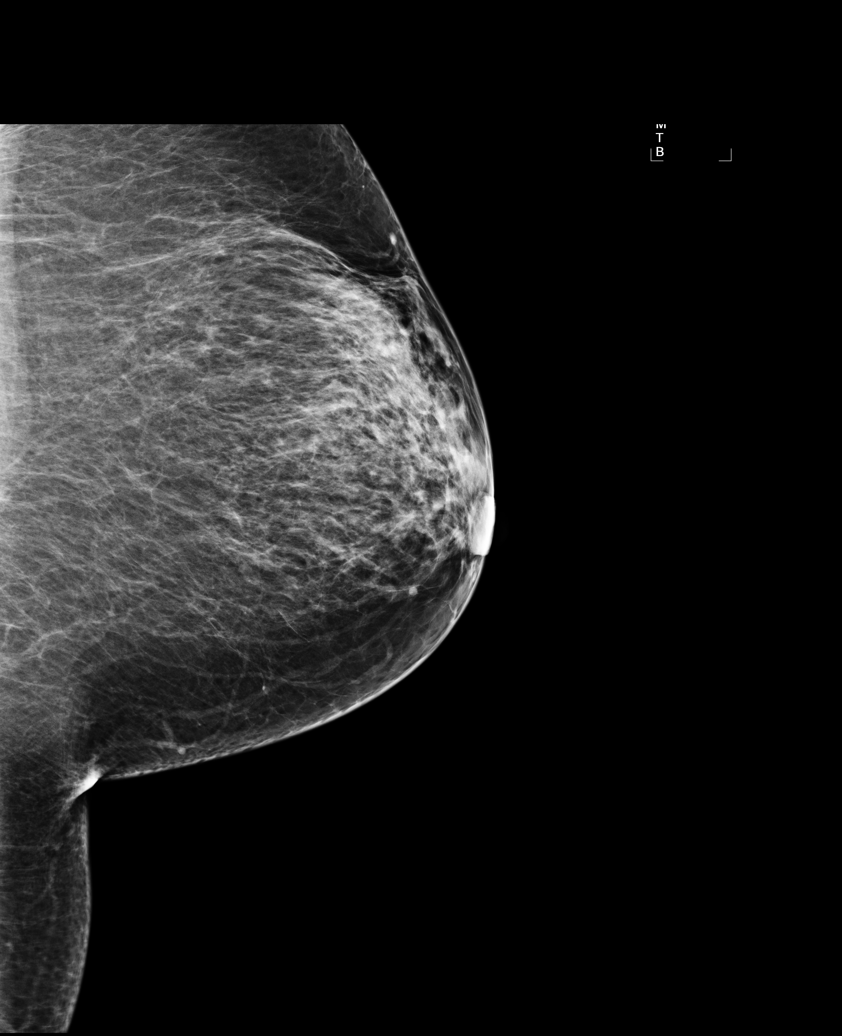

[5 of 5 positions shown; findings below may reference images not displayed]

There are scattered fibroglandular densities.  There is no dominant mass, architectural distortion 
or calcification to suggest malignancy.

Images were processed with CAD.
IMPRESSION: No mammographic evidence of malignancy.  Suggest yearly screening mammography.

A result letter of this screening mammogram will be mailed directly to the patient.

ASSESSMENT: Negative - BI-RADS 1

Screening mammogram in 1 year.
,

## 2009-03-12 ENCOUNTER — Ambulatory Visit: Payer: Self-pay | Admitting: Infectious Diseases

## 2009-08-20 ENCOUNTER — Ambulatory Visit: Payer: Self-pay | Admitting: Obstetrics & Gynecology

## 2009-08-20 ENCOUNTER — Encounter (INDEPENDENT_AMBULATORY_CARE_PROVIDER_SITE_OTHER): Payer: Self-pay | Admitting: *Deleted

## 2009-08-20 LAB — CONVERTED CEMR LAB: Pap Smear: NEGATIVE

## 2009-08-29 ENCOUNTER — Encounter (INDEPENDENT_AMBULATORY_CARE_PROVIDER_SITE_OTHER): Payer: Self-pay | Admitting: *Deleted

## 2009-10-17 ENCOUNTER — Ambulatory Visit: Payer: Self-pay | Admitting: Internal Medicine

## 2009-10-17 DIAGNOSIS — L0291 Cutaneous abscess, unspecified: Secondary | ICD-10-CM | POA: Insufficient documentation

## 2009-10-17 DIAGNOSIS — L0390 Cellulitis, unspecified: Secondary | ICD-10-CM | POA: Insufficient documentation

## 2010-03-02 ENCOUNTER — Ambulatory Visit
Admission: RE | Admit: 2010-03-02 | Discharge: 2010-03-02 | Payer: Self-pay | Source: Home / Self Care | Attending: Infectious Diseases | Admitting: Infectious Diseases

## 2010-03-02 ENCOUNTER — Encounter: Payer: Self-pay | Admitting: Infectious Diseases

## 2010-03-02 LAB — CONVERTED CEMR LAB
ALT: 21 units/L (ref 0–35)
AST: 19 units/L (ref 0–37)
Albumin: 4.4 g/dL (ref 3.5–5.2)
Alkaline Phosphatase: 137 units/L — ABNORMAL HIGH (ref 39–117)
BUN: 18 mg/dL (ref 6–23)
Basophils Absolute: 0 10*3/uL (ref 0.0–0.1)
Basophils Relative: 1 % (ref 0–1)
CO2: 29 meq/L (ref 19–32)
Calcium: 9.6 mg/dL (ref 8.4–10.5)
Chloride: 103 meq/L (ref 96–112)
Cholesterol: 216 mg/dL — ABNORMAL HIGH (ref 0–200)
Creatinine, Ser: 0.76 mg/dL (ref 0.40–1.20)
Eosinophils Absolute: 0.2 10*3/uL (ref 0.0–0.7)
Eosinophils Relative: 4 % (ref 0–5)
Glucose, Bld: 96 mg/dL (ref 70–99)
HCT: 38.5 % (ref 36.0–46.0)
HDL: 69 mg/dL (ref >39)
HIV 1 RNA Quant: <20 copies/mL (ref <20)
HIV-1 RNA Quant, Log: <1.3 (ref <1.30)
Hemoglobin: 12.4 g/dL (ref 12.0–15.0)
LDL Cholesterol: 137 mg/dL — ABNORMAL HIGH (ref 0–99)
Lymphocytes Relative: 30 % (ref 12–46)
Lymphs Abs: 2 10*3/uL (ref 0.7–4.0)
MCHC: 32.2 g/dL (ref 30.0–36.0)
MCV: 92.8 fL (ref 78.0–100.0)
Monocytes Absolute: 0.4 10*3/uL (ref 0.1–1.0)
Monocytes Relative: 6 % (ref 3–12)
Neutro Abs: 4 10*3/uL (ref 1.7–7.7)
Neutrophils Relative %: 60 % (ref 43–77)
Platelets: 274 10*3/uL (ref 150–400)
Potassium: 4.9 meq/L (ref 3.5–5.3)
RBC: 4.15 M/uL (ref 3.87–5.11)
RDW: 13.8 % (ref 11.5–15.5)
Sodium: 140 meq/L (ref 135–145)
Total Bilirubin: 0.3 mg/dL (ref 0.3–1.2)
Total CHOL/HDL Ratio: 3.1
Total Protein: 7.4 g/dL (ref 6.0–8.3)
Triglycerides: 50 mg/dL (ref <150)
VLDL: 10 mg/dL (ref 0–40)
WBC: 6.6 10*3/uL (ref 4.0–10.5)

## 2010-03-09 LAB — T-HELPER CELL (CD4) - (RCID CLINIC ONLY)
CD4 % Helper T Cell: 43 % (ref 33–55)
CD4 T Cell Abs: 840 uL (ref 400–2700)

## 2010-03-12 ENCOUNTER — Ambulatory Visit (HOSPITAL_COMMUNITY)
Admission: RE | Admit: 2010-03-12 | Discharge: 2010-03-12 | Payer: Self-pay | Source: Home / Self Care | Attending: Infectious Diseases | Admitting: Infectious Diseases

## 2010-03-12 IMAGING — MG MM DIGITAL SCREENING
5 series · 5 of 5 positions shown · non-contrast
Comparison: none

DG SCREEN MAMMOGRAM BILATERAL
Bilateral CC and MLO view(s) were taken.
Technologist: EDUCATIONORGANIZATION.(EDUCATIONORGANIZATION)(M)

DIGITAL SCREENING MAMMOGRAM WITH CAD:
There are scattered fibroglandular densities.  No masses or malignant type calcifications are 
identified.  Compared with prior studies.
Images were processed with CAD.

[R CC]
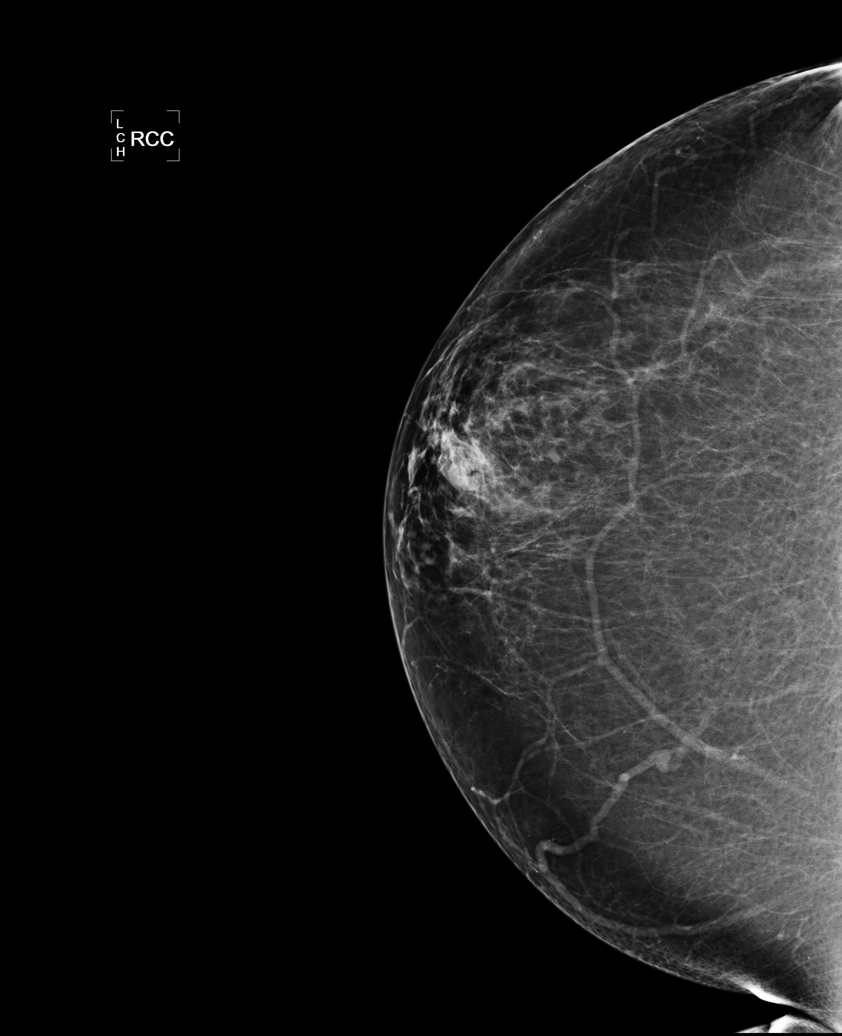

[R MLO (1 of 2)]
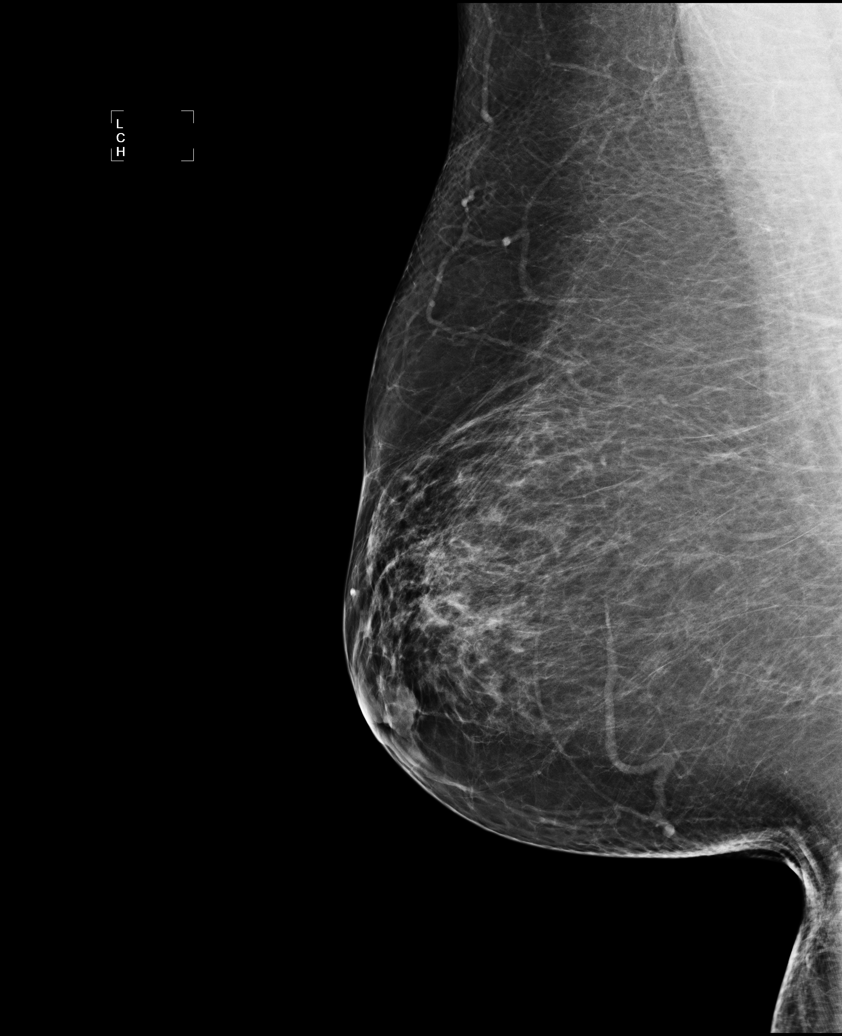

[L CC]
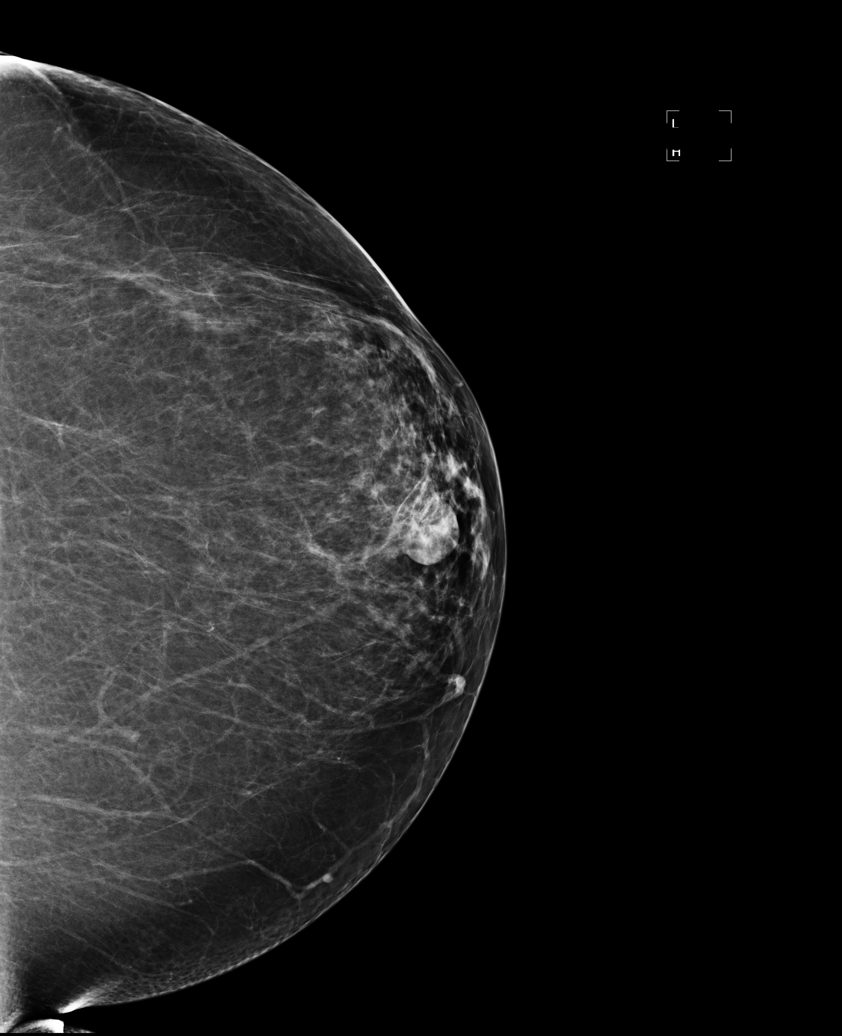

[L MLO]
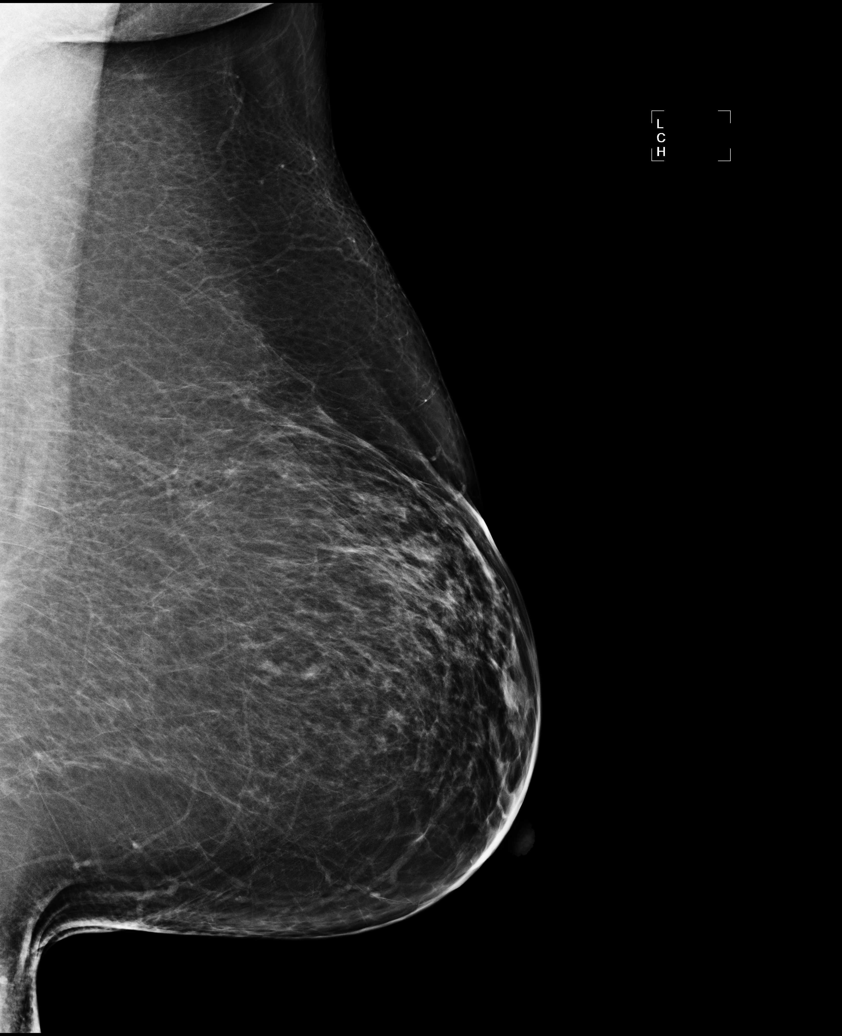

[R MLO (2 of 2)]
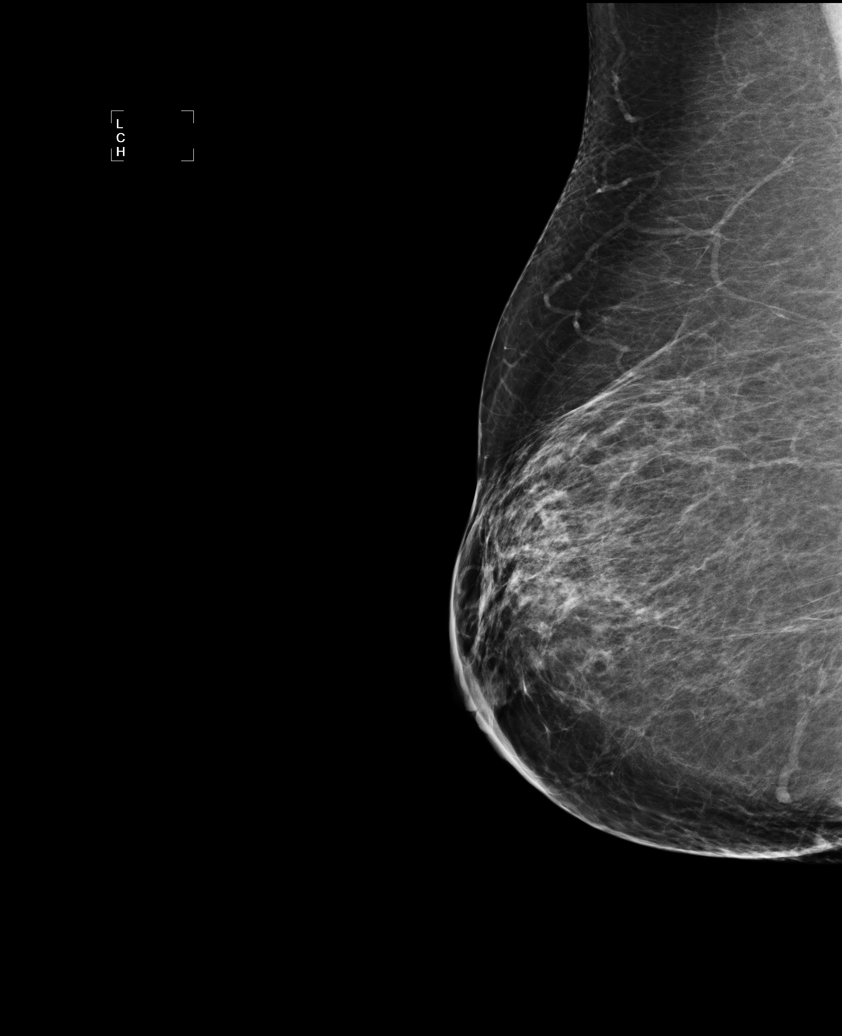

[5 of 5 positions shown; findings below may reference images not displayed]

IMPRESSION: No specific mammographic evidence of malignancy.  Next screening mammogram is recommended in one 
year.

A result letter of this screening mammogram will be mailed directly to the patient.

ASSESSMENT: Negative - BI-RADS 1

Screening mammogram in 1 year.
,

## 2010-03-16 ENCOUNTER — Ambulatory Visit
Admission: RE | Admit: 2010-03-16 | Discharge: 2010-03-16 | Payer: Self-pay | Source: Home / Self Care | Attending: Infectious Diseases | Admitting: Infectious Diseases

## 2010-03-24 NOTE — Letter (Signed)
Summary: Letter of Care  Letter of Care   Imported By: America Brown 12/14/2006 13:11:49  _____________________________________________________________________  External Attachment:    Type:   Image     Comment:   External Document

## 2010-03-24 NOTE — Miscellaneous (Signed)
Summary: [facility] Medications  [facility] Medications   Imported By: Florinda Marker 07/08/2008 14:04:53  _____________________________________________________________________  External Attachment:    Type:   Image     Comment:   External Document

## 2010-03-24 NOTE — Assessment & Plan Note (Signed)
Summary: FU/VS   Visit Type:  Follow-up Primary Provider:  Sampson Goon  CC:  6 month follow up visit and Lipid Management.  History of Present Illness: 55 yo pleasant woman with well controlled HIV doing great.  No intercurrent illnesses since last seen in Oct 2009.  Had pap and mmg done since then both normal  Lipid Management History:      Positive NCEP/ATP III risk factors include hypertension.  Negative NCEP/ATP III risk factors include female age less than 38 years old, no history of early menopause without estrogen hormone replacement, non-diabetic, no family history for ischemic heart disease, non-tobacco-user status, no ASHD (atherosclerotic heart disease), no prior stroke/TIA, no peripheral vascular disease, and no history of aortic aneurysm.     Preventive Screening-Counseling & Management     Alcohol drinks/day: rarely     Smoking Status: never     Caffeine use/day: occassional ice tea     Does Patient Exercise: yes     Type of exercise: treadmill and walking     Exercise (avg: min/session): 30-60     Times/week: 5     Seat Belt Use: yes   Prior Medication List:  ATRIPLA 600-200-300 MG TABS (EFAVIRENZ-EMTRICITAB-TENOFOVIR) One pill a day CLOBETASOL PROPIONATE 0.05 %  OINT (CLOBETASOL PROPIONATE) Apply ointment every day as needed CLOBETASOL PROPIONATE 0.05 %  SOLN (CLOBETASOL PROPIONATE) Apply every day as needed   Current Allergies (reviewed today): ! CODEINE Past History:  Past Medical History:    HIV disease - dxed 55 - infected workplace exposure.  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  NO OIs    Cellulitis in legs - post traumatic    Cholecystectomy     Abnormal fasting glucose     Hyperlipidemia    Hypertension - ? white coat.  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)  Risk Factors:    Alcohol Use: rarely (06/11/2008)    >5 drinks/d w/in last 3 months: N/A    Caffeine Use: occassional ice tea (06/11/2008)    Diet: N/A    Exercise: yes (06/11/2008)  Risk Factors:    Smoking Status: never (06/11/2008)    Packs/Day: N/A    Cigars/wk: N/A    Pipe Use/wk: N/A    Cans of tobacco/wk: N/A    Passive Smoke Exposure: no (05/02/2006)  Review of Systems       11 systems reviewed and negative except per HPI   Vital Signs:  Patient profile:   55 year old female Height:      66 inches (167.64 cm) Weight:      167.6 pounds (76.18 kg) BMI:     27.15 Temp:     96.6 degrees F (35.89 degrees C) oral Pulse rate:   60 / minute BP sitting:   125 / 67  (right arm)  Vitals Entered By: Baxter Hire) (June 11, 2008 8:54 AM) CC: 6 month follow up visit, Lipid Management Is Patient Diabetic? No Pain Assessment Patient in pain? no      Nutritional Status BMI of 25 - 29 = overweight Nutritional Status Detail good  Have you ever been in a relationship where you felt threatened, hurt or afraid?No   Does patient need assistance? Functional Status  Self care Ambulation Normal        Medication Adherence: 06/11/2008   Adherence to medications reviewed with patient. Counseling to provide adequate adherence provided    Prevention For Positives: 06/11/2008   Safe sex practices discussed with patient. Condoms offered.                            Physical Exam  General:  alert, well-developed, and well-nourished.   Head:  normocephalic.   Mouth:  good dentition.   Neck:  supple.   Lungs:  normal respiratory effort and normal breath sounds.   Heart:  normal rate and regular rhythm.   Skin:  no rashes.   Psych:  Oriented X3 and memory intact for recent and remote.     Impression & Recommendations:  Problem # 1:  HIV DISEASE (ICD-042) Doing great. Cont atripl.a refill given. SHe is not sexually active. Discussed HIV  prevention, disclosure of HIV status to partners, and use of condoms for all genital contact with the patient.  Diagnostics Reviewed:  HIV: HIV positive - not AIDS (12/05/2007)   CD4: 510 (05/29/2008)   CD4 %: 45 (04/18/2006) WBC: 4.7 (05/28/2008)   Hgb: 12.8 (05/28/2008)   HCT: 39.3 (05/28/2008)   Platelets: 271 (05/28/2008) HIV-1 RNA: 180 (05/28/2008)   HBSAg: No (04/18/2006)  Problem # 2:  HYPERTENSION, ESSENTIAL NOS (ICD-401.9) Assessment: Improved BP normal.  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 Patient Level II (10272) Future Orders: T-CD4 ([id number]) ... 03/08/2009 T-HIV Viral Load [phone]) ... 03/08/2009 T-CBC w/Diff ([id number]) ... 03/08/2009 T-Comprehensive Metabolic Panel [phone]) ... 03/08/2009 T-RPR (Syphilis) [phone]) ... 03/08/2009 T-Lipid Profile [phone]) ... 03/08/2009  Lipid Assessment/Plan:      Based on NCEP/ATP III, the patient's risk factor category is "0-1 risk factors".  From this information, the patient's calculated lipid goals are as follows: Total cholesterol goal is 200; LDL cholesterol goal is 160; HDL cholesterol goal is 40; Triglyceride goal is 150.  Her LDL cholesterol goal has been met.    Patient Instructions: 1)  Please schedule a follow-up appointment in 9 months.  Prescriptions: CLOBETASOL PROPIONATE 0.05 %  OINT (CLOBETASOL PROPIONATE) Apply ointment every day as needed  #60 x 11   Entered and Authorized by:   Clydie Braun MD   Signed by:   Clydie Braun MD on 06/11/2008   Method used:   Handwritten   RxID:   [id number] ATRIPLA 600-200-300 MG TABS (EFAVIRENZ-EMTRICITAB-TENOFOVIR) One pill a day  #90 x 12   Entered and Authorized by:   Clydie Braun MD   Signed by:   Clydie Braun MD on  06/11/2008   Method used:   Print then Give to Patient   RxID:   [id number]

## 2010-03-24 NOTE — Miscellaneous (Signed)
Summary: [facility] Flowsheet update - PAP smears  Clinical Lists Changes  Observations: Added new observation of PAP SMEAR: NEGATIVE (08/20/2009 11:18) Added new observation of LAST PAP DAT: 08/20/2009 (08/20/2009 11:18) Added new observation of PAP SMEAR: NEGATIVE (08/15/2008 11:18) Added new observation of LAST PAP DAT: 08/15/2008 (08/15/2008 11:18)

## 2010-03-24 NOTE — Assessment & Plan Note (Signed)
Summary: transfer from VD/tkk   Visit Type:  Follow-up PCP:  Sampson Goon  Chief Complaint:  PATIENT NEW TO DR. /  WANT COPY OF LAB / NEED FORM FILLED OUT.  History of Present Illness: 55 yo WF with history HIV well controlled    Current Allergies: ! CODEINE  Past Medical History:    HIV disease - dxed 75 - infected workplace exposure.  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  NO OIs    Cellulitis in legs - post traumatic    Cholecystectomy     Abnormal fasting glucose     Hyperlipidemia    Hypertension - ? white coat.  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  PAP Smear History:    Date of Last PAP Smear:  06/30/2007   Review of Systems       11 systems reviewed and negative except per HPI    Vital Signs:  Patient Profile:   55 Years Old Female Height:     66 inches (167.64 cm) Weight:      177.5 pounds (80.68 kg) BMI:     28.75 Temp:     96.5 degrees F (35.83 degrees C) oral Pulse rate:   66 / minute BP sitting:   148 / 85  (left arm) Cuff size:   regular  Pt. in pain?   no  Vitals Entered By: Theotis Barrio NT II (December 05, 2007 9:09 AM)              Is Patient Diabetic? No Nutritional Status BMI of > 30 = obese  Have you ever been in a relationship where you felt threatened, hurt or afraid?No   Does patient need assistance? Functional Status Self care Ambulation Normal     Physical Exam  General:     alert and well-developed.   Head:     normocephalic.   Mouth:     good dentition.   Neck:     supple.   Lungs:     normal respiratory effort and normal breath sounds.   Heart:     normal rate, regular rhythm, and no murmur.     Abdomen:     soft, non-tender, no hepatomegaly, and no splenomegaly.   Msk:     normal ROM.   Extremities:     no cce Neurologic:     alert & oriented X3, cranial nerves II-XII intact, and strength normal in all extremities.   Skin:     no rashes.   Psych:     Oriented X3, memory intact for recent and remote, and normally interactive.      Impression & Recommendations:  Problem # 1:  HIV DISEASE (ICD-042) Doing great on her atripla.  Continue current meds and f/u in 6 months Her updated medication list for this problem includes:    Atripla 600-200-300 Mg Tabs (Efavirenz-emtricitab-tenofovir) ..... One pill a day   Problem # 2:  HYPERTENSION, ESSENTIAL NOS (ICD-401.9) I suspect white coat hypertension since she takes it at home and it is always  normal.  Will follow for now.  Problem # 3:  PREVENTIVE HEALTH CARE (ICD-V70.0) up to date on vaccines, pap and colonoscopy  Problem # 4:  OBESITY (ICD-278.00) She has successfully lost 100 pounds over last year  on weight watchers.  COngratulated her on the success and I filled out a form so she can seek reimbursement from her insurance for flex spending.  Other Orders: Est. Patient Level II (14782)  Future Orders: T-CD4 ([id number]) ... 06/02/2008 T-HIV Viral Load [phone]) ... 06/02/2008 T-CBC w/Diff ([id number]) ... 06/02/2008 T-Comprehensive Metabolic Panel [id number]) ... 06/02/2008      Patient Instructions: 1)  Please schedule a follow-up appointment in 6 months.   ]

## 2010-03-24 NOTE — Assessment & Plan Note (Signed)
Summary: f/u [mkj]   Primary Provider:  Sampson Goon  CC:  follow-up visit.  History of Present Illness: 55 yo pleasant woman with well controlled HIV doing great.  No intercurrent illnesses since last seen in Oct 2009.  SHe is upto date on her pap and mmg. Declines colonoscopy.  Preventive Screening-Counseling & Management  Alcohol-Tobacco     Alcohol drinks/day: rarely     Smoking Status: never     Passive Smoke Exposure: no  Caffeine-Diet-Exercise     Caffeine use/day: occassional ice tea     Does Patient Exercise: yes     Type of exercise: treadmill and walking     Exercise (avg: min/session): 30-60     Times/week: 5  Safety-Violence-Falls     Seat Belt Use: yes   Updated Prior Medication List: ATRIPLA 600-200-300 MG TABS (EFAVIRENZ-EMTRICITAB-TENOFOVIR) One pill a day CLOBETASOL PROPIONATE 0.05 %  OINT (CLOBETASOL PROPIONATE) Apply ointment every day as needed CLOBETASOL PROPIONATE 0.05 %  SOLN (CLOBETASOL PROPIONATE) Apply every day as needed  Current Allergies (reviewed today): ! CODEINE Past History:  Past Medical History: Last updated: 12/05/2007 HIV disease - dxed 1990 - infected workplace exposure.  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  NO OIs Cellulitis in legs - post traumatic Cholecystectomy  Abnormal fasting glucose  Hyperlipidemia Hypertension - ? white coat.  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[employer]  Risk Factors: Alcohol Use: rarely (03/12/2009) Caffeine Use: occassional ice tea (03/12/2009) Exercise: yes (03/12/2009)  Risk Factors: Smoking Status: never (03/12/2009) Passive Smoke Exposure: no (03/12/2009)  Review of Systems       11 systems reviewed and negative except per HPI   Vital Signs:  Patient profile:   55 year old female Height:      66  inches (167.64 cm) Weight:      199.2 pounds (90.55 kg) BMI:     32.27 Temp:     97.2 degrees F (36.22 degrees C) oral Pulse rate:   73 / minute BP sitting:   126 / 70  (right arm)  Vitals Entered By: Baxter Hire) (March 12, 2009 10:04 AM) CC: follow-up visit Is Patient Diabetic? No Pain Assessment Patient in pain? no      Nutritional Status BMI of > 30 = obese Nutritional Status Detail appetite is good per patient  Does patient need assistance? Functional Status Self care Ambulation Normal   Physical Exam  General:  alert.      Impression & Recommendations:  Problem # 1:  HIV DISEASE (ICD-042)  Doing great. WIll reassign to Dr Ninetta Lights and f/u one year.  Diagnostics Reviewed:  HIV: HIV positive - not AIDS (12/05/2007)   CD4: 580 (02/25/2009)   CD4 %: 45 (04/18/2006) WBC: 5.0 (02/24/2009)   Hgb: 12.9 (02/24/2009)   HCT: 38.6 (02/24/2009)   Platelets: 270 (02/24/2009) HIV-1 RNA: 108 (02/24/2009)   HBSAg: No (04/18/2006)  Problem # 2:  PREVENTIVE HEALTH CARE (ICD-V70.0) Up todate on mmg and pap.    Problem # 3:  ABNORMAL GLUCOSE NEC (ICD-790.29)  aic was wnl.  Will remove this as problem  Labs Reviewed: Creat: 0.68 (02/24/2009)     Problem # 4:  HYPERTENSION, ESSENTIAL NOS (ICD-401.9) BO decent on last several check .  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 Patient Level IV (16109) Future Orders: T-CD4SP ([facility]) (CD4SP) ... 03/07/2010 T-HIV Viral Load [phone]) ... 03/07/2010 T-CBC w/Diff ([id number]) ... 03/07/2010 T-Comprehensive Metabolic Panel [id number]) ... 03/07/2010 T-Lipid  Profile [id number]) ... 03/07/2010  Patient Instructions: 1)  Follow up with Dr Ninetta Lights in 12 months. 2)  Call for sooner apptment if needed.  Prescriptions: CLOBETASOL PROPIONATE 0.05 %  SOLN (CLOBETASOL PROPIONATE) Apply every day as needed  #2 x 12   Entered and Authorized by:   Clydie Braun MD   Signed by:   Clydie Braun MD on 03/12/2009   Method used:   Print then Give to Patient   RxID:   [id number] CLOBETASOL PROPIONATE 0.05 %  SOLN (CLOBETASOL PROPIONATE) Apply every day as needed  #1 x 12   Entered and Authorized by:   Clydie Braun MD   Signed by:   Clydie Braun MD on 03/12/2009   Method used:   Print then Give to Patient   RxID:   [id number] ATRIPLA 600-200-300 MG TABS (EFAVIRENZ-EMTRICITAB-TENOFOVIR) One pill a day  #90 x 12   Entered and Authorized by:   Clydie Braun MD   Signed by:   Clydie Braun MD on 03/12/2009   Method used:   Print then Give to Patient   RxID:   [id number]  Process Orders Check Orders Results:     [facility]: ABN not required for this insurance Tests Sent for requisitioning (March 12, 2009 12:43 PM):     03/07/2010: [facility] -- T-HIV Viral Load [phone] (signed)     03/07/2010: [facility] -- T-CBC w/Diff [[id number]] (signed)     03/07/2010: [facility] -- T-Comprehensive Metabolic Panel [[id number]] (signed)     03/07/2010: [facility] -- T-Lipid Profile [phone] (signed)    Influenza Immunization History:    Influenza # 1:  Historical (12/06/2008)

## 2010-03-24 NOTE — Miscellaneous (Signed)
Summary: RW - HIV/AIDS Status  Clinical Lists Changes  Observations: Added new observation of YEARAIDSPOS: HIV2008, c (02/21/2007 11:30)                                                               Hep C Result:  No

## 2010-03-24 NOTE — Miscellaneous (Signed)
Summary: Orders Update - labs  Clinical Lists Changes  Orders: Added new Test order of T-RPR (Syphilis) ([id number]) - Signed 

## 2010-03-24 NOTE — Progress Notes (Signed)
Summary: Change ointment to solution, 64-month supply, mail-order  Phone Note Call from Patient Call back at [facility] Phone [phone]   Caller: Patient Reason for Call: Refill Medication Summary of Call: Pt. would appreciate the rx for clobetasol propionate 0.05% ointment be changed to 0.005% solution with a 64-month supply using [employer], [employer].  [name]  Jul 05, 2008 3:17 PM  Initial call taken by: Clydie Braun MD,  Jul 08, 2008 3:31 PM  Follow-up for Phone Call        I have refilled and sent to the pharmacy listed on her refill list. Thanks Theodoro Grist Follow-up by: Clydie Braun MD,  Jul 08, 2008 3:23 PM      Prescriptions: CLOBETASOL PROPIONATE 0.05 %  SOLN (CLOBETASOL PROPIONATE) Apply every day as needed  #1 x 6   Entered and Authorized by:   Clydie Braun MD   Signed by:   Clydie Braun MD on 07/08/2008   Method used:   Print then Give to Patient   RxID:   [id number]   Appended Document: Change ointment to solution, 51-month supply, mail-order pharmacy notified as well as patient.

## 2010-03-24 NOTE — Progress Notes (Signed)
Summary: OV 05/02/2006  OV 05/02/2006   Imported By: Tomasita Morrow RN 05/04/2006 09:59:31  _____________________________________________________________________  External Attachment:    Type:   Image     Comment:   External Document

## 2010-03-24 NOTE — Progress Notes (Signed)
Summary: Office Visit  Office Visit   Imported By: Randon Goldsmith 08/17/2006 13:10:49  _____________________________________________________________________  External Attachment:    Type:   Image     Comment:   External Document

## 2010-03-24 NOTE — Assessment & Plan Note (Signed)
Summary: [facility]   Chief Complaint:  check-up.  Current Allergies (reviewed today): ! CODEINE    Risk Factors:  Tobacco use:  never Passive smoke exposure:  no Caffeine use:  2 drinks per day Alcohol use:  no Exercise:  yes    Times per week:  5    Type:  treadmill, 30 min.  PAP Smear History:    Date of Last PAP Smear:  04/18/2006    Vital Signs:  Patient Profile:   55 Years Old Female Weight:      264.4 pounds (120.18 kg) Temp:     97.7 degrees F (36.50 degrees C) oral Pulse rate:   78 / minute BP sitting:   150 / 86  (left arm)  Pt. in pain?   no  Vitals Entered By: [name] (August 08, 2006 3:04 PM)              Is Patient Diabetic? No Nutritional Status Detail appetite "way too good"  Have you ever been in a relationship where you felt threatened, hurt or afraid?No   Does patient need assistance? Functional Status Self care Ambulation Normal     Impression & Recommendations:  Problem # 1:  HIV DISEASE (ICD-042) Assessment: Unchanged  Her updated medication list for this problem includes:    Atripla 600-200-300 Mg Tabs (Efavirenz-emtricitab-tenofovir) ..... One pill a day  Orders: No Charge Patient Arrived ([id number])   Here to discuss options for new doctor and to say goodbye. Will refer to new [facility] docs in fall.  Please see scanned note from dictation [phone]

## 2010-03-24 NOTE — Miscellaneous (Signed)
Summary: Lab Rpt: CD4  Clinical Lists Changes  Observations: Added new observation of CD4 COUNT: 790 microliters (04/18/2006 15:51) Added new observation of CD4 %: 45 % (04/18/2006 15:51)

## 2010-03-24 NOTE — Assessment & Plan Note (Signed)
Summary: Waiting on PAP SMEAR results   Vital Signs:  Patient Profile:   55 Years Old Female CC:      PAP Smear Visit LMP:     05/24/2007  Vitals Entered By: [name] (June 09, 2007 3:48 PM)  Menstrual History: LMP (date): 05/24/2007 LMP - Character: normal               Last PAP Date 06/09/2007                                                           Hep C Result:  No                                     Medical History/Family History    Infection History  Patient has been diagnosed with the following opportunistic infections: Previous Viral Load: 560 Previous CD4: 560   Prior Medications: ATRIPLA 600-200-300 MG TABS (EFAVIRENZ-EMTRICITAB-TENOFOVIR) One pill a day CLOBETASOL PROPIONATE 0.05 %  OINT (CLOBETASOL PROPIONATE) Apply ointment every day as needed CLOBETASOL PROPIONATE 0.05 %  SOLN (CLOBETASOL PROPIONATE) Apply every day as needed Current Allergies: ! CODEINE   Orders Added: 1)  T-Pap Smear [88150] 2)  Est. Patient Level I [16109]    Prescriptions: CLOBETASOL PROPIONATE 0.05 %  SOLN (CLOBETASOL PROPIONATE) Apply every day as needed  #75 x 4   Entered by:   [name]   Authorized by:   Acey Lav MD   Signed by:   [name] on 06/09/2007   Method used:   Print then Give to Patient   RxID:   [id number]  ]

## 2010-03-24 NOTE — Letter (Signed)
Summary: OV  OV   Imported By: Dorice Lamas 05/10/2006 12:37:54  _____________________________________________________________________  External Attachment:    Type:   Image     Comment:   External Document

## 2010-03-24 NOTE — Miscellaneous (Signed)
Summary: [facility]PS  SHPS   Imported By: Randon Goldsmith 03/17/2007 12:11:47  _____________________________________________________________________  External Attachment:    Type:   Image     Comment:   External Document

## 2010-03-24 NOTE — Assessment & Plan Note (Signed)
Summary: CHECKUP/ SB.   Chief Complaint:  f/u.  History of Present Illness: 55 year old Caucasian lady with HIV, obesity, borderline blood sugars, borderline BP, presents to clinic today. She had been fully supressed on atripla, and still claims 100% adherence. Her most recent viral load was 156 and cd4 count of 770  . SHe has lost >40 pound of weight through weight watcher's program.  BP is much better at 122 systolic today. She is not sexually active. She denies depression.. She otherwise has no complaints today. She is widowed and not sexually active at present.    Current Allergies (reviewed today): ! CODEINE  Past Medical History:    HIV disease    Cellulitis:     Cholecystectomy    Abnormal fasting glucose    Hyperlipidemia    Hypertension    Risk Factors: Tobacco use:  never Passive smoke exposure:  no Drug use:  no Caffeine use:  2 drinks per day Alcohol use:  no Exercise:  yes    Times per week:  5    Type:  treadmill, 30 min.  PAP Smear History:    Date of Last PAP Smear:  04/18/2006   Review of Systems       The patient complains of weight loss.  The patient denies anorexia, fever, chest pain, dyspnea on exhertion, prolonged cough, hemoptysis, abdominal pain, melena, hematochezia, and muscle weakness.     Vital Signs:  Patient Profile:   55 Years Old Female Height:     66 inches (167.64 cm) Weight:      219.19 pounds (99.63 kg) BMI:     35.51 Temp:     97.1 degrees F (36.17 degrees C) oral Pulse rate:   73 / minute BP sitting:   123 / 78  (left arm)  Pt. in pain?   no  Vitals Entered By: Starleen Arms CMA (May 29, 2007 9:34 AM)              Is Patient Diabetic? No Nutritional Status BMI of > 30 = obese  Does patient need assistance? Functional Status Self care Ambulation Normal Comments no missed doses     Physical Exam  General:     alert, well-developed, well-nourished, and overweight-appearing.   Head:     normocephalic.     Eyes:     vision grossly intact.   Mouth:     pharynx pink and moist, no erythema, and no exudates.   Lungs:     normal respiratory effort, normal breath sounds, no crackles, and no wheezes.   Heart:     normal rate, regular rhythm, no murmur, and no gallop.   Abdomen:     soft and non-tender.      Impression & Recommendations:  Problem # 1:  HIV DISEASE (ICD-042) Seems highly adherent. Her updated medication list for this problem includes:    Atripla 600-200-300 Mg Tabs (Efavirenz-emtricitab-tenofovir) ..... One pill a day  Orders: Est. Patient Level IV (04540)  Future Orders: T-CBC w/Diff ([id number]) ... 10/09/2007 T-CD4 (95621-30865) ... 10/09/2007 T-HIV Viral Load [phone]) ... 10/09/2007 T-Lipid Profile [phone]) ... 10/09/2007 T-Hgb A1C (in-house) [phone]) ... 10/09/2007 T-Hepatitis A Antibody ([id number]) ... 10/09/2007   Problem # 2:  HYPERTENSION, ESSENTIAL NOS (ICD-401.9) Much improved with weight loss. Orders: Est. Patient Level IV (56387)  Future Orders: T-Hgb A1C (in-house) (56433IR) ... 10/09/2007   Problem # 3:  ABNORMAL GLUCOSE NEC (ICD-790.29) Still with fasting glucose of 103. suspect she is preDM/DM. She  wishes to continue with weight loss efforts Orders: Est. Patient Level IV (54270)   Problem # 4:  PREVENTIVE HEALTH CARE (ICD-V70.0) Her LDL is not at goal for diabetic. AGain will let her continue her weight loss goals and then if proves to be diabetic will institue pravachol Check hep a ab next visit. Denise to check pap smear status. Orders: Est. Patient Level IV (62376)    Patient Instructions: 1)  Please schedule a follow-up appointment in 5 months. 2)  Come in fasting for your labs in August.    ]

## 2010-03-24 NOTE — Letter (Signed)
Summary: Results Follow-up Letter  [facility]  [address]   [place], [place] [address]   Phone: [phone]  Fax: [phone]        Jun 23, 2007  [address] [place], [place]  [address]  Dear [name],   The following are the results of your recent test(s):  Test     Result     Pap Smear    Normal_XXX__  Not Normal_____       Comments:  Dr. Daiva Eves has referred you for follow-up of this abnormal PAP smear.  An appointment has been made for you at [facility] [place], [place] [facility].  Apppointment information:    Thursday, Jul 20, 2007   12:45 PM   Please be there at 12:30 for registration    GYN Clinic @ [facility]   [address]   Tel. # [phone]  If you have insurance please be prepared to pay you co-pay at registration.  If you are unable to keep this scheduled appointment, please call the number above to cancel or reschedule.  Thank you.  Sincerely,    [name] [name]  Appended Document: Results Follow-up Letter Wrong information placed in letter.  It was NOT sent to the patient.  This PAP smeat was abnormal not normal as was checked in the letter.  A new letter will be typed and sent to the patient.

## 2010-03-24 NOTE — Miscellaneous (Signed)
Summary: HIPAA Restrictions  HIPAA Restrictions   Imported By: Florinda Marker 11/21/2007 14:04:10  _____________________________________________________________________  External Attachment:    Type:   Image     Comment:   External Document

## 2010-03-24 NOTE — Assessment & Plan Note (Signed)
Summary: fukam   Vital Signs:  Patient Profile:   55 Years Old Female LMP:     04/06/2006 Weight:      262.0 pounds (119.09 kg) Temp:     97.7 degrees F (36.50 degrees C) Pulse rate:   84 / minute BP sitting:   157 / 97  (left arm)  Pt. in pain?   no  Vitals Entered By: [name] (May 02, 2006 9:18 AM)  Menstrual History: LMP (date): 04/06/2006 LMP - Character: normal              Nutritional Status Obese Nutritional Status Detail appetite, "too good"  Have you ever been in a relationship where you felt threatened, hurt or afraid?No   Does patient need assistance? Functional Status Self care Ambulation Normal  Prescriptions: ATRIPLA 600-200-300 MG TABS (EFAVIRENZ-EMTRICITAB-TENOFOVIR) One pill a day  #100 x 12   Entered and Authorized by:   Lenn Sink MD   Signed by:   Lenn Sink MD on 05/02/2006   Method used:   Print then Give to Patient   RxID:   [id number]    Chief Complaint:  f/u visit.  Prior Medications: ATRIPLA 600-200-300 MG TABS (EFAVIRENZ-EMTRICITAB-TENOFOVIR) One pill a day Current Allergies: ! CODEINE    Risk Factors:  Tobacco use:  never Passive smoke exposure:  no Caffeine use:  2 drinks per day Alcohol use:  no Exercise:  yes    Times per week:  5    Type:  treadmill, 30 min.      Impression & Recommendations:  Problem # 1:  HIV DISEASE (ICD-042)  Her updated medication list for this problem includes:    Atripla 600-200-300 Mg Tabs (Efavirenz-emtricitab-tenofovir) ..... One pill a day  Orders: Est. Patient Level III (14782)  Future Orders: T-Comprehensive Metabolic Panel ([id number]) ... 10/24/2006 T-CBC w/Diff ([id number]) ... 10/24/2006 T-CD4 (84132-44010) ... 10/24/2006 T-HIV Viral Load [phone]) ... 10/24/2006    Patient Instructions: 1)  Please schedule a follow-up appointment in 6 months. 2)  see scanned dictation from [phone]  ]

## 2010-03-24 NOTE — Letter (Signed)
Summary: Results Follow-up Letter  [facility]  [address]   [place], [place] [address]   Phone: [phone]  Fax: [phone]       Jun 23, 2007  [address] [place], [place]  [address]  Dear [name],   The following are the results of your recent test(s):  Test     Result     Pap Smear    Normal_______  Not Normal  XXX___       Comments:  Dr. Daiva Eves has referred you for follow-up of this abnormal PAP smear.  An appointment has been made for you at [facility] [place] [place] [facility].  Appointment information:    Thursday, Jul 20, 2007   12:45 PM   Please be there at 12:30 PM for registration.   GYN Clinic at [facility]   [address]   Tel. # [phone]  If you have insurance please be prepared to pay your co-pay at registration.  If you are unable to keep this scheduled appointment, please call the number above to cancel or reschedule.  Thank you.  Sincerely,    [name] [name]

## 2010-03-24 NOTE — Miscellaneous (Signed)
Summary: clinical update/ryan white  Clinical Lists Changes  Observations: Added new observation of INFECTDIS MD: Daiva Eves MD (02/06/2007 8:22) Added new observation of PCTFPL: 528.85  (02/06/2007 8:22) Added new observation of HOUSEINCOME: 04540  (02/06/2007 8:22) Added new observation of MARITAL STAT: Widowed  (02/06/2007 8:22) Added new observation of FINASSESSDT: 11/28/2006  (02/06/2007 8:22) Added new observation of YEARLYEXPEN: 1909  (02/06/2007 8:22)

## 2010-03-24 NOTE — Assessment & Plan Note (Signed)
Summary: rash, chills   Primary Provider:  Sampson Goon  CC:  pt. c/o rash on abdomen that burns since yesterday.  History of Present Illness: Pt noted some redness around her umbilical area yesterday and today it spread.  It burns and is warm to the touch.  no drainage.  She has felt chilled  Preventive Screening-Counseling & Management  Alcohol-Tobacco     Alcohol drinks/day: rarely     Smoking Status: never     Passive Smoke Exposure: no  Caffeine-Diet-Exercise     Caffeine use/day: occassional ice tea     Does Patient Exercise: yes     Type of exercise: treadmill and walking     Exercise (avg: min/session): 30-60     Times/week: 5  Safety-Violence-Falls     Seat Belt Use: yes      Sexual History:  currently monogamous.        Drug Use:  no.    Comments: pt. declined condoms   Updated Prior Medication List: ATRIPLA 600-200-300 MG TABS (EFAVIRENZ-EMTRICITAB-TENOFOVIR) One pill a day CLOBETASOL PROPIONATE 0.05 %  OINT (CLOBETASOL PROPIONATE) Apply ointment every day as needed CLOBETASOL PROPIONATE 0.05 %  SOLN (CLOBETASOL PROPIONATE) Apply every day as needed DOXYCYCLINE HYCLATE 100 MG CAPS (DOXYCYCLINE HYCLATE) Take 1 capsule by mouth two times a day  Current Allergies (reviewed today): ! CODEINE Past History:  Past Medical History: Last updated: 12/05/2007 HIV disease - dxed 1991 - infected workplace exposure.  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  NO OIs Cellulitis in legs - post traumatic Cholecystectomy  Abnormal fasting glucose  Hyperlipidemia Hypertension - ? white coat.  When she checks it at home it is 120/76  Social History: Sexual History:  currently monogamous  Review of Systems  The patient denies anorexia and abdominal pain.    Vital Signs:  Patient profile:   55 year old female Height:      66 inches (167.64 cm) Weight:      204.2 pounds (92.82 kg) BMI:     33.08 Temp:     98.0 degrees F (36.67 degrees C) oral Pulse rate:   70 /  minute BP sitting:   129 / 84  (left arm)  Vitals Entered By: Wendall Mola CMA Duncan Dull) (October 17, 2009 3:19 PM) CC: pt. c/o rash on abdomen that burns since yesterday Is Patient Diabetic? No Pain Assessment Patient in pain? yes     Location: abdomen Intensity: 4 Type: burning Onset of pain  Constant Nutritional Status BMI of > 30 = obese Nutritional Status Detail appetite "normal"  Have you ever been in a relationship where you felt threatened, hurt or afraid?No   Does patient need assistance? Functional Status Self care Ambulation Normal Comments no missed doses of meds per patient   Physical Exam  General:  alert, well-developed, well-nourished, and well-hydrated.   Head:  normocephalic and atraumatic.   Abdomen:  soft and non-tender.  erythema around umbilicus no purulent drainage.  area is warm to the touch   Impression & Recommendations:  Problem # 1:  CELLULITIS AND ABSCESS OF UNSPECIFIED SITE (ICD-682.9) will treat with ceftriaxone 1 gm IM then doxycycline 100mg  two times a day for 10 days pt to go to Ed if infection spreads or if she develops a high fever Her updated medication list for this problem includes:    Doxycycline Hyclate 100 Mg Caps (Doxycycline hyclate) .Marland Kitchen... Take 1 capsule by mouth two times a day  Orders: Est. Patient Level III (14782)  Rocephin  250mg  (Z6109)  Medications Added to Medication List This Visit: 1)  Doxycycline Hyclate 100 Mg Caps (Doxycycline hyclate) .... Take 1 capsule by mouth two times a day  Other Orders: Admin of Therapeutic Inj  intramuscular or subcutaneous (60454) Prescriptions: DOXYCYCLINE HYCLATE 100 MG CAPS (DOXYCYCLINE HYCLATE) Take 1 capsule by mouth two times a day  #20 x 0   Entered and Authorized by:   Yisroel Ramming MD   Signed by:   Yisroel Ramming MD on 10/17/2009   Method used:   Print then Give to Patient   RxID:   [id number]    Medication Administration  Injection # 1:    Medication:  Rocephin  250mg     Diagnosis: CELLULITIS AND ABSCESS OF UNSPECIFIED SITE (ICD-682.9)    Route: IM    Site: LUOQ gluteus    Exp Date: 12/24/2011    Lot #: [id number]    Mfr: Sandoz    Comments: pt. given 1 gram    Patient tolerated injection without complications    Given by: Wendall Mola CMA Duncan Dull) (October 17, 2009 3:52 PM)  Orders Added: 1)  Est. Patient Level III [84696] 2)  Rocephin  250mg  [J0696] 3)  Admin of Therapeutic Inj  intramuscular or subcutaneous [29528]

## 2010-03-24 NOTE — Assessment & Plan Note (Signed)
Summary: FU FOR [facility] PAT[facility]/NBS   Chief Complaint:  routine 042 ck.  History of Present Illness: 55 year old Caucasian lady with HIV, obesity, borderline blood sugars, borderline BP, presents to clinic today. She is fully suppressed on her atripla <50 with cd4 count of 700. She was a bit hypertensive today in clinic and I offered her to iniate an anti-hypertensive but she preferred to continue with her diet and exercise program that she has just begun in last few weeks. She otherwise has no complaints today.  Current Allergies (reviewed today): ! CODEINE   Family History:    no hx of early Cad  Social History:    Never Smoked    Drug use-no    Regular exercise-yes   Risk Factors:  Tobacco use:  never Passive smoke exposure:  no Drug use:  no Caffeine use:  2 drinks per day Alcohol use:  no Exercise:  yes    Times per week:  5    Type:  treadmill, 30 min.  PAP Smear History:    Date of Last PAP Smear:  04/18/2006   Review of Systems  The patient denies anorexia, fever, weight loss, vision loss, chest pain, syncope, dyspnea on exhertion, peripheral edema, prolonged cough, abdominal pain, melena, severe indigestion/heartburn, and hematuria.     Vital Signs:  Patient Profile:   55 Years Old Female Weight:      258.2 pounds (117.36 kg) Temp:     97.3 degrees F (36.28 degrees C) oral Pulse rate:   80 / minute BP sitting:   150 / 97  (left arm)  Pt. in pain?   no  Vitals Entered By: Krystal Eaton Duncan Dull) (November 28, 2006 9:00 AM)              Is Patient Diabetic? No  Have you ever been in a relationship where you felt threatened, hurt or afraid?No   Does patient need assistance? Functional Status Self care Ambulation Normal     Physical Exam  General:     alert, well-developed, well-nourished, and overweight-appearing.   Head:     normocephalic.   Eyes:     vision grossly intact.   Ears:     no external deformities.   Nose:     no  external deformity.   Mouth:     pharynx pink and moist, no erythema, and no exudates.   Lungs:     normal respiratory effort, normal breath sounds, no crackles, and no wheezes.   Heart:     normal rate, regular rhythm, no murmur, and no gallop.   Abdomen:     soft and non-tender.   Extremities:     No clubbing, cyanosis, edema, or deformity noted with normal full range of motion of all joints.   Neurologic:     alert & oriented X3.      Impression & Recommendations:  Problem # 1:  HIV DISEASE (ICD-042)  Her updated medication list for this problem includes:    Atripla 600-200-300 Mg Tabs (Efavirenz-emtricitab-tenofovir) ..... One pill a day  Orders: T-RPR (Syphilis) [phone]) T-Comprehensive Metabolic Panel [phone]) T-Lipid Profile [phone]) T-CBC No Diff ([id number]) T-CD4 (28413-24401) T-HIV Viral Load (02725-36644) Est. Patient Level IV (03474)   Problem # 2:  PSORIASIS NEC (ICD-696.1)  Orders: Est. Patient Level IV (25956)   Problem # 3:  HYPERTENSION, ESSENTIAL NOS (ICD-401.9) AS above patient wishes to continue her diet and exdrcise for now before starting anti-HTN medication.  Orders:H T-Comprehensive Metabolic  Panel [phone]) T-Lipid Profile [phone]) T-CBC No Diff (30865-78469) Est. Patient Level IV (62952)  Orders: T-Comprehensive Metabolic Panel ([id number]) T-Lipid Profile (27253-66440) T-CBC No Diff (85027-10000) Est. Patient Level IV (34742)   Problem # 4:  ABNORMAL GLUCOSE NEC (ICD-790.29) Recheck whether or not this was fasting. My suspicion is that she has developed DM II. Add glycosylated hemoglobin tonext lab. Orders: Hemoglobin A1C (83036)   Medications Added to Medication List This Visit: 1)  Clobetasol Propionate 0.05 % Oint (Clobetasol propionate) .... Apply ointment every day as needed 2)  Clobetasol Propionate 0.05 % Soln (Clobetasol propionate) .... Apply every day as  needed     Prescriptions: ATRIPLA 600-200-300 MG TABS (EFAVIRENZ-EMTRICITAB-TENOFOVIR) One pill a day  #100 x 12   Entered and Authorized by:   Acey Lav MD   Signed by:   Paulette Blanch Dam MD on 11/28/2006   Method used:   Print then Give to Patient   RxID:   [id number] CLOBETASOL PROPIONATE 0.05 %  SOLN (CLOBETASOL PROPIONATE) Apply every day as needed  #25 x 11   Entered and Authorized by:   Acey Lav MD   Signed by:   Paulette Blanch Dam MD on 11/28/2006   Method used:   Print then Give to Patient   RxID:   [id number] CLOBETASOL PROPIONATE 0.05 %  OINT (CLOBETASOL PROPIONATE) Apply ointment every day as needed  #60 x 11   Entered and Authorized by:   Acey Lav MD   Signed by:   Paulette Blanch Dam MD on 11/28/2006   Method used:   Print then Give to Patient   RxID:   [id number]  ]

## 2010-03-26 NOTE — Assessment & Plan Note (Signed)
Summary: F/U OV/DR [id number] PT/VS   Primary Provider:  Sampson Goon  CC:  follow-up visit, lab results, Dr. Tresa Garter. pt. c/o mild sorethroat x 1 week, and trouble sleeping.  History of Present Illness: 55 yo F with HIV+ since May 1991. Has been doing well since. On atripla for at least 5 years. CD4 840 and VL <20, CHol 216 (03-02-10). SHe feels great and is without complaint. Had mammo that was nl (03-12-10). Has PAP sched for June 2012.   Preventive Screening-Counseling & Management  Alcohol-Tobacco     Alcohol drinks/day: rarely     Alcohol type: spirits     Smoking Status: never     Passive Smoke Exposure: no  Caffeine-Diet-Exercise     Caffeine use/day: occassional ice tea     Does Patient Exercise: yes     Type of exercise: treadmill and walking     Exercise (avg: min/session): 30-60     Times/week: 5  Safety-Violence-Falls     Seat Belt Use: yes      Sexual History:  currently monogamous.        Drug Use:  no.    Comments: pt. declined condoms   Updated Prior Medication List: ATRIPLA 600-200-300 MG TABS (EFAVIRENZ-EMTRICITAB-TENOFOVIR) One pill a day CLOBETASOL PROPIONATE 0.05 %  OINT (CLOBETASOL PROPIONATE) Apply ointment every day as needed CLOBETASOL PROPIONATE 0.05 %  SOLN (CLOBETASOL PROPIONATE) Apply every day as needed  Current Allergies (reviewed today): ! CODEINE Past History:  Past medical, surgical, family and social histories (including risk factors) reviewed, and no changes noted (except as noted below).  Past Medical History: Reviewed history from 12/05/2007 and no changes required. HIV disease - dxed 67 - infected workplace exposure.  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  NO OIs Cellulitis in legs - post traumatic Cholecystectomy  Abnormal fasting glucose  Hyperlipidemia Hypertension - ? white coat.  When she checks it at home it is 120/76  Current Medications (verified): 1)  Atripla 600-200-300 Mg Tabs (Efavirenz-Emtricitab-Tenofovir) ....  One Pill A Day 2)  Clobetasol Propionate 0.05 %  Oint (Clobetasol Propionate) .... Apply Ointment Every Day As Needed 3)  Clobetasol Propionate 0.05 %  Soln (Clobetasol Propionate) .... Apply Every Day As Needed 4)  Sleep Aid 25 Mg Tabs (Doxylamine Succinate (Sleep)) .Marland Kitchen.. 1-2 At Bedtime As Needed Insomnia  Allergies (verified): 1)  ! Codeine   Family History: Reviewed history from 11/28/2006 and no changes required. no hx of early Cad  Social History: Reviewed history from 12/05/2007 and no changes required. Never Smoked Drug use-no Regular exercise-yes Works as Insurance account manager - [employer]  Review of Systems  The patient denies headaches.         gained 35# since last visit. "likes to eat, likes to cook". no CP. slight sore throat.   Vital Signs:  Patient profile:   55 year old female Height:      66 inches (167.64 cm) Weight:      239.12 pounds (108.69 kg) BMI:     38.73 Temp:     97.7 degrees F (36.50 degrees C) oral Pulse rate:   83 / minute BP sitting:   148 / 85  (left arm)  Vitals Entered By: Wendall Mola CMA Duncan Dull) (March 16, 2010 1:47 PM) CC: follow-up visit, lab results, Dr. Tresa Garter. pt. c/o mild sorethroat x 1 week, trouble sleeping Is Patient Diabetic? No Pain Assessment Patient in pain? no      Nutritional Status BMI of > 30 = obese  Nutritional Status Detail appetite "good"  Have you ever been in a relationship where you felt threatened, hurt or afraid?No   Does patient need assistance? Functional Status Self care Ambulation Normal Comments no missed doses of meds. per pt.   Physical Exam  General:  well-developed, well-nourished, well-hydrated, and overweight-appearing.   Eyes:  pupils equal, pupils round, and pupils reactive to light.   Mouth:  pharynx pink and moist and no exudates.   Neck:  no masses.   Lungs:  normal respiratory effort and normal breath sounds.   Heart:  normal rate, regular rhythm, and no murmur.   Abdomen:  soft,  non-tender, and normal bowel sounds.     Impression & Recommendations:  Problem # 1:  HIV DISEASE (ICD-042) she is doing very well. will add RPR to her labs. She is taking meds well. Will await results of her PAP. She is goign to work on losing wt. Hopefully this will improve her BP as well. WIll continue to monitor this. she will return to clinic in 1 year.   Problem # 2:  OBESITY (ICD-278.00) a note is signed for to attend wt watchers. she will watch diet, exercise, try to improve BP and Chol.   Medications Added to Medication List This Visit: 1)  Sleep Aid 25 Mg Tabs (Doxylamine succinate (sleep)) .Marland Kitchen.. 1-2 at bedtime as needed insomnia  Other Orders: Est. Patient Level III ([address]) Est. Patient Level IV (60454) T-RPR (Syphilis) (09811-91478) Future Orders: T-CD4SP ([facility]) (CD4SP) ... 12/11/2010 T-HIV Viral Load [id number]) ... 12/11/2010 T-Comprehensive Metabolic Panel [phone]) ... 12/11/2010 T-CBC w/Diff ([id number]) ... 12/11/2010 T-RPR (Syphilis) [phone]) ... 12/11/2010 T-Lipid Profile [phone]) ... 12/11/2010  Prescriptions: CLOBETASOL PROPIONATE 0.05 %  OINT (CLOBETASOL PROPIONATE) Apply ointment every day as needed  #60 x 11   Entered and Authorized by:   Johny Sax MD   Signed by:   Johny Sax MD on 03/16/2010   Method used:   Faxed to ...       [facility] Environmental education officer)       [address] [address]       [place], [place]  [address]       Ph: [phone]       Fax: [phone]   RxID:   [id number] ATRIPLA 600-200-300 MG TABS (EFAVIRENZ-EMTRICITAB-TENOFOVIR) One pill a day  #90 x 12   Entered and Authorized by:   Johny Sax MD   Signed by:   Johny Sax MD on 03/16/2010   Method used:   Faxed to ...       [facility] education [facility])       [address] [address]       [place], [place]  [address]       Ph: [phone]       Fax: [phone]   RxID:   [id number] SLEEP AID 25 MG TABS (DOXYLAMINE SUCCINATE (SLEEP)) 1-2 at bedtime as needed  insomnia  #30 x 3   Entered and Authorized by:   Johny Sax MD   Signed by:   Johny Sax MD on 03/16/2010   Method used:   Faxed to ...       [facility] education officer)       [address] [address]       [place], [place]  [address]       Ph: [phone]       Fax: [phone]   RxID:   [id number]    Immunization History:  Influenza Immunization History:    Influenza:  historical (12/03/2009)  Medication Adherence: 03/16/2010   Adherence to medications reviewed with patient. Counseling to provide adequate adherence provided   Prevention For Positives: 03/16/2010   Safe sex practices discussed with patient. Condoms offered.

## 2010-05-10 LAB — T-HELPER CELL (CD4) - (RCID CLINIC ONLY)
CD4 % Helper T Cell: 45 % (ref 33–55)
CD4 T Cell Abs: 580 uL (ref 400–2700)

## 2010-06-03 LAB — T-HELPER CELL (CD4) - (RCID CLINIC ONLY)
CD4 % Helper T Cell: 45 % (ref 33–55)
CD4 T Cell Abs: 510 uL (ref 400–2700)

## 2010-07-01 ENCOUNTER — Ambulatory Visit (INDEPENDENT_AMBULATORY_CARE_PROVIDER_SITE_OTHER): Payer: 59

## 2010-07-01 ENCOUNTER — Telehealth: Payer: Self-pay | Admitting: *Deleted

## 2010-07-01 ENCOUNTER — Inpatient Hospital Stay (INDEPENDENT_AMBULATORY_CARE_PROVIDER_SITE_OTHER)
Admission: RE | Admit: 2010-07-01 | Discharge: 2010-07-01 | Disposition: A | Discharge status: Home / Self Care | Payer: 59 | Source: Ambulatory Visit | Attending: Family Medicine | Admitting: Family Medicine

## 2010-07-01 DIAGNOSIS — S60229A Contusion of unspecified hand, initial encounter: Secondary | ICD-10-CM

## 2010-07-01 DIAGNOSIS — S60219A Contusion of unspecified wrist, initial encounter: Secondary | ICD-10-CM

## 2010-07-01 IMAGING — CR DG HAND COMPLETE 3+V*L*
3 series · 3 of 3 positions shown · non-contrast
Comparison: None.

CLINICAL DATA: History of injury from fall.  Injury 3 days
previously.  Pain involving the palm area in the first metacarpal
region.  Soft tissue swelling.

LEFT HAND - COMPLETE 3+ VIEW

[view not recorded (1 of 3)]
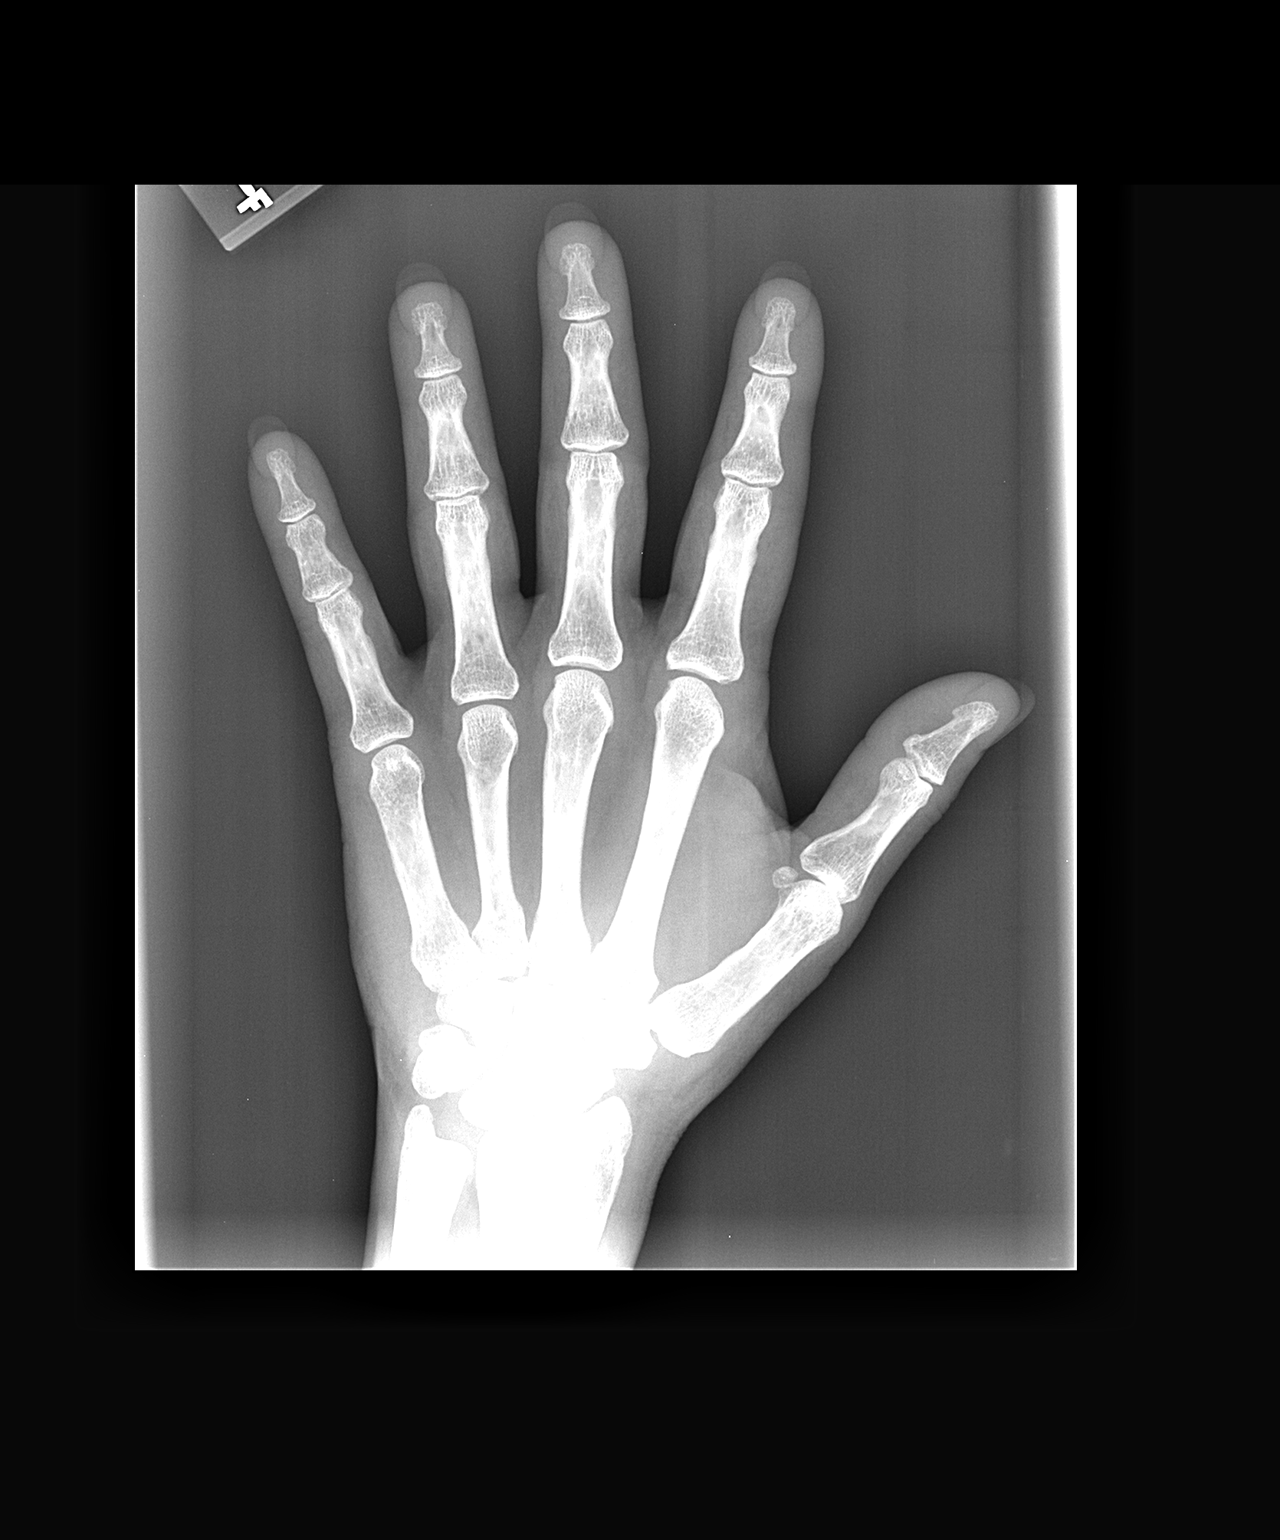

[view not recorded (2 of 3)]
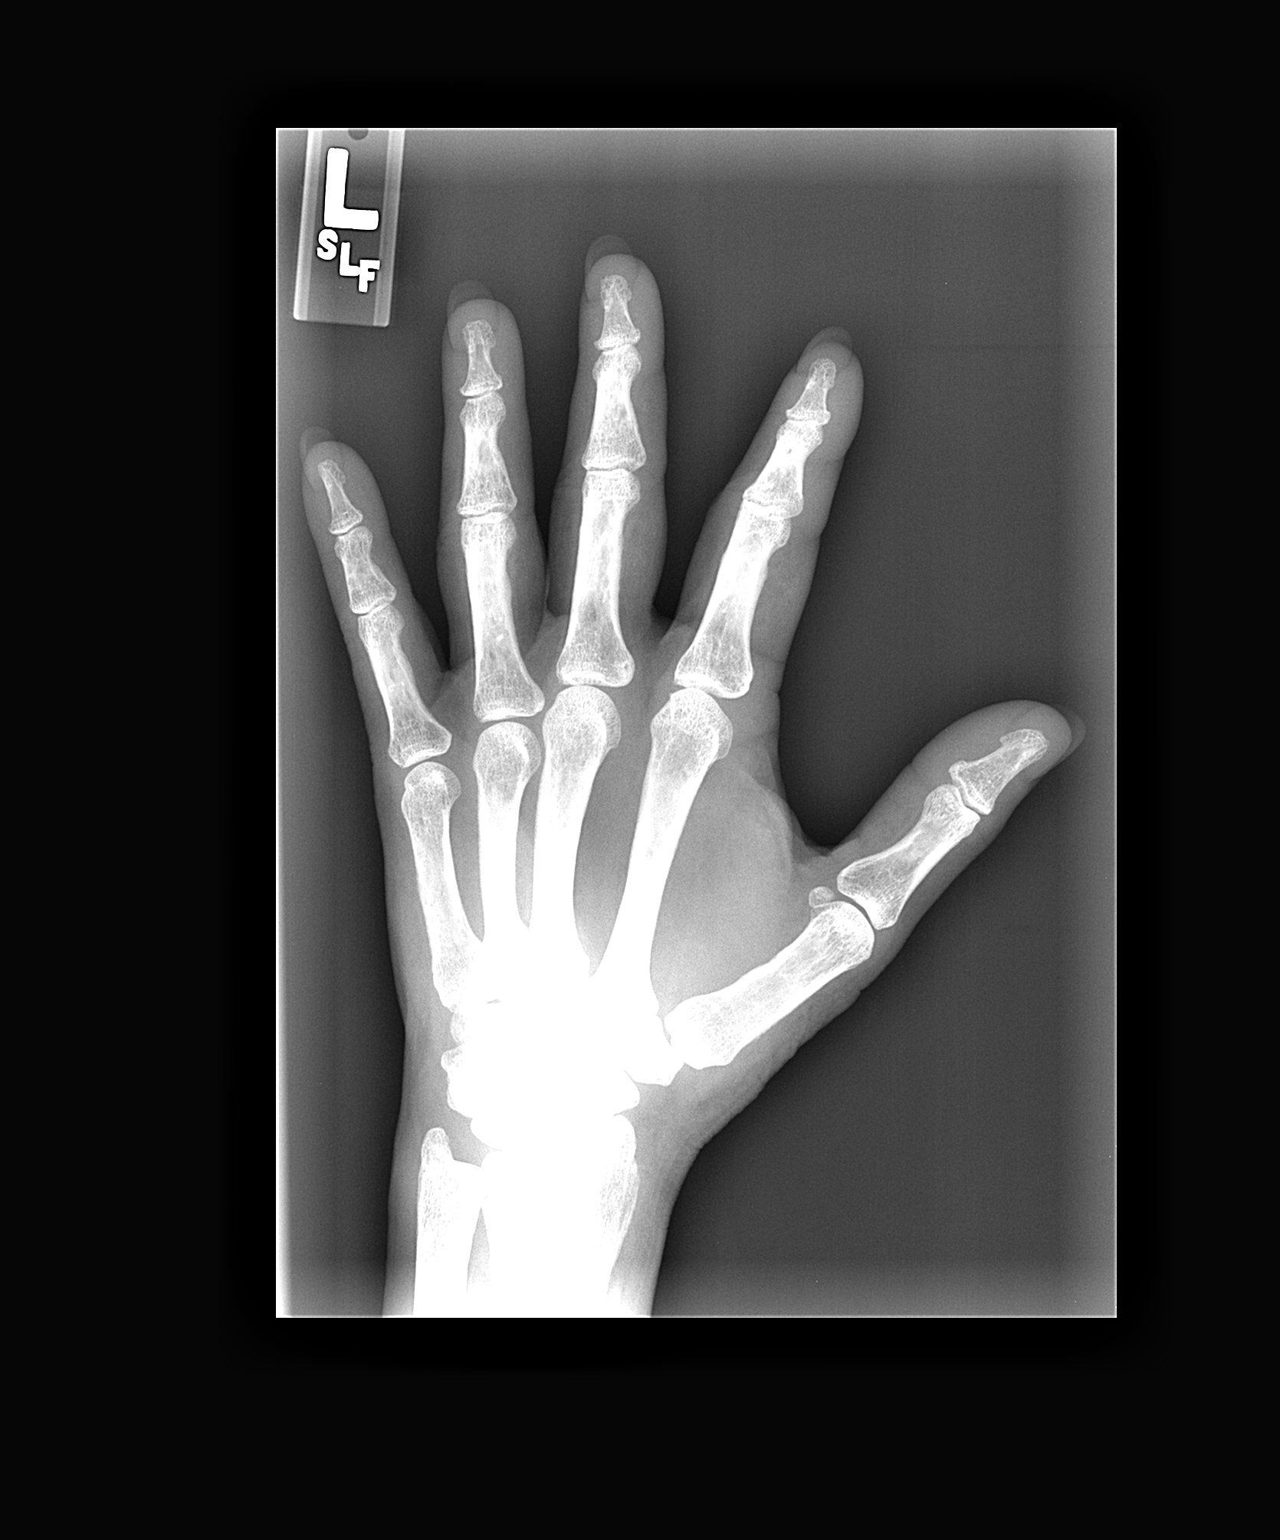

[view not recorded (3 of 3)]
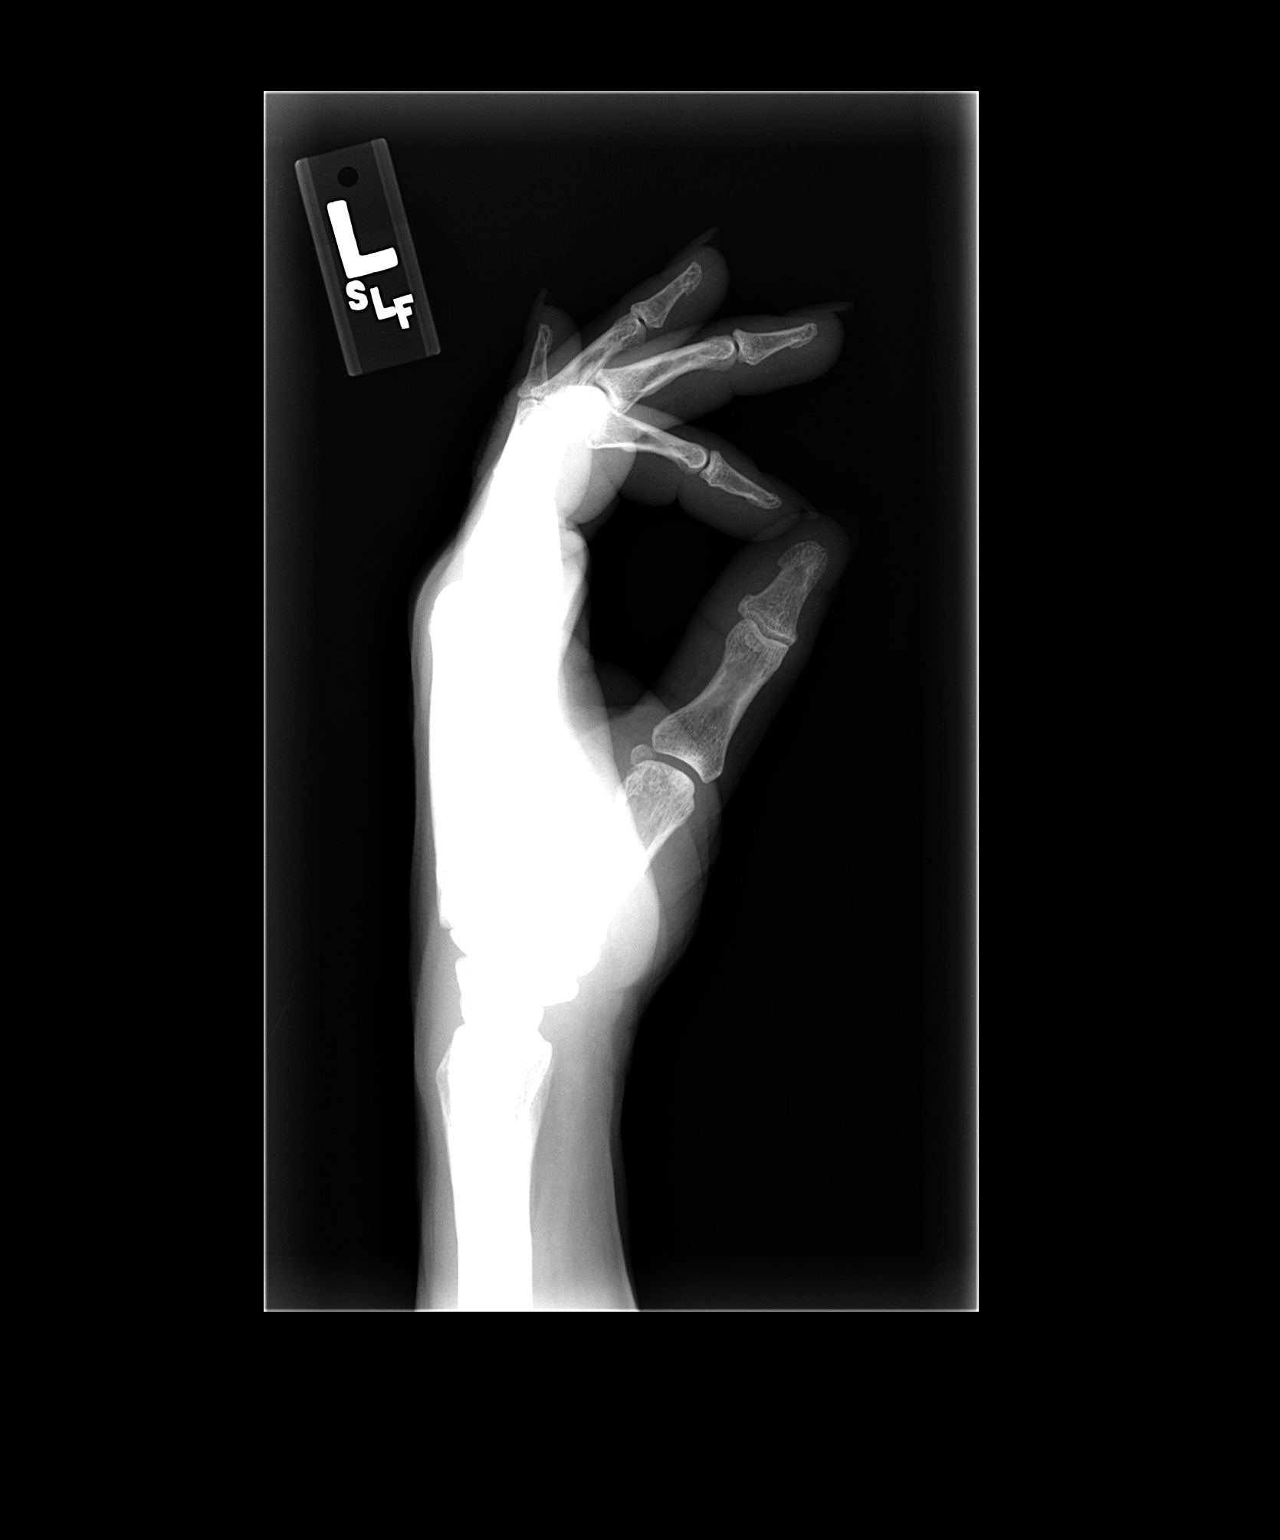

[3 of 3 positions shown; findings below may reference images not displayed]

FINDINGS: Alignment is normal.  Joint spaces are preserved.  No
fracture or dislocation is evident.  No soft tissue lesions are
seen.
IMPRESSION: No fracture or dislocation.

## 2010-07-01 NOTE — Telephone Encounter (Signed)
Message left by pt indicating fall on wrist 06/28/10.  Painful and bruised.  Wanting to know what to do.  RN returned call and advised pt to go to [facility] for evaluation of the problem.  Pt verbalized understanding and stated that the would go to [facility].  Jennet Maduro, RN

## 2010-07-07 NOTE — Group Therapy Note (Signed)
NAME:  [name], [name] NO.:  [id number]   MEDICAL RECORD NO.:  [id number]          PATIENT TYPE:  WOC   LOCATION:  [facility]                   FACILITY:  [facility]   PHYSICIAN:  Ginger Carne, MD DATE OF BIRTH:  03-17-1955   DATE OF SERVICE:  07/14/2007                                  CLINIC NOTE   Ms. Gail Edwards returns today for followup after colposcopy performed for low-  grade squamous intraepithelial lesion on Jun 30, 2007.  At that time, the  colposcopic findings were satisfactory.  ECC demonstrates benign  endocervical squamous mucosa.   At this point, the patient will be followed with 53-month Pap smears x2  and then after if all is normal yearly exam.  The patient satisfied with  all questions answered appropriately and will follow up on the above  regimen.           ______________________________  Ginger Carne, MD     SHB/MEDQ  D:  07/14/2007  T:  07/14/2007  Job:  [id number]

## 2010-07-07 NOTE — Group Therapy Note (Signed)
NAME:  Gail Edwards, GULLATT                ACCOUNT NO.:  [id number]   MEDICAL RECORD NO.:  [id number]          PATIENT TYPE:  WOC   LOCATION:  [facility]                   FACILITY:  [facility]   PHYSICIAN:  Odie Sera, D.O.    DATE OF BIRTH:  [birth year]   DATE OF SERVICE:  01/26/2008                                  CLINIC NOTE   REASON FOR VISIT:  Followup Pap smear after colposcopy.   HISTORY OF PRESENT ILLNESS:  Mr. Kerri Perches had a low-grade squamous  intraepithelial lesion Pap in April 2009.  Colposcopy was performed on  Jun 30, 2007.  Colposcopy results revealed no areas of acetowhite  epithelium and endocervical curettage revealed benign endocervical  mucosa.  She presents today for her 31-month followup Pap smear.   PHYSICAL FINDINGS:  VITAL SIGNS:  Blood pressure 118/69, heart rate 67,  temp 96.7, weight 177 pounds.  She is 5 feet and 6 inches tall.  GU:  Her external genitalia is moderately atrophic.  The vaginal mucosa  is normal at discharge.  Cervix is easily seen and appears to be  nulliparous and this is consistent with her history of a previous C-  section.  A Pap smear was collected.   ASSESSMENT AND PLAN:  Low-grade squamous intraepithelial lesion cervical  Pap smear with normal colposcopy.  Pap smear was repeated today at a 6-  month interval from her colposcopy.  I discussed the current plan with  the patient to include; if this Pap smear is normal, repeat one in 6  months.  If this Pap smear is abnormal, she will proceed to another  colposcopy.  We will contact the patient by telephone with her results  in approximately 2 weeks.  The patient is to make an appointment for  another Pap smear in 6 months on her way out today.           ______________________________  Odie Sera, D.O.     MC/MEDQ  D:  01/26/2008  T:  01/26/2008  Job:  [id number]

## 2010-07-10 NOTE — Discharge Summary (Signed)
NAME:  Gail Edwards, Gail Edwards                          ACCOUNT NO.:  [id number]   MEDICAL RECORD NO.:  [id number]                   PATIENT TYPE:  INP   LOCATION:  5007                                 FACILITY:  [facility]   PHYSICIAN:  Alvester Morin, M.D.               DATE OF BIRTH:  May 27, 1955   DATE OF ADMISSION:  08/18/2002  DATE OF DISCHARGE:  08/20/2002                                 DISCHARGE SUMMARY   DISCHARGE DIAGNOSES:  1. Right lower extremity cellulitis with lymphangitis.  2. Saphenous vein superficial thrombosis on the right lower extremity.  3. Human immunodeficiency virus positive.  4. History of psoriasis.  5. History of zoster.  6. Lipodystrophy.  7. Status post cholecystectomy.   DISCHARGE MEDICATIONS:  1. Keflex 500 mg p.o. q.i.d. x1 week.  2. V______ 300 mg at bedtime.  3. Sustiva 600 mg at bedtime.  4. Epivir 200 mg at bedtime.  5. Vicodin 5/500 one or two tablets p.r.n. for pain.   CONDITION ON DISCHARGE:  The patient was discharged in good condition.  She  was instructed to keep her leg elevated as much as possible, to watch  closely the erythema and edema subside, to abstain from flying to [place]  for at least a week, and to report back to the emergency room or the [facility] if symptoms worsen.   FOLLOWUP:  Follow up with Dr. Wayland Salinas as previously scheduled in  the outpatient clinic.   PROCEDURES:  No procedures were done.   CONSULTATIONS:  No consultations were obtained.   LABORATORY AND ACCESSORY DATA:  Please see H&P for admission labs.   HISTORY OF PRESENT ILLNESS:  This is a 55 year old white female with a  history of HIV, and a 24 hour history of right lower extremity ankle  swelling which increased over the past 24 hours and with erythema, warmth,  and pain.  Ten days prior to admission, the patient extracted a small piece  of glass from the sole of her foot with a non-sterilized sewing needle.  She  had a  prior episode of cellulitis in the past in 1994.  Never smoked  tobacco, occasional alcohol, denies IV drug use.   REVIEW OF SYSTEMS:  Positive for fever, chills, intentional 23 pound weight  loss, occasional headaches.   PHYSICAL EXAMINATION:  VITAL SIGNS:  Temperature 99.2, pulse 102, blood  pressure 150/86, respirations 20, saturating 100% on room air.  GENERAL:  Cushingoid body habitus, no apparent distress.  LUNGS:  Clear.  HEART:  Regular with a 2/6 systolic murmur best heard at the right upper  sternal border.  ABDOMEN:  Obese, soft, nontender.  EXTREMITIES:  The right lower extremity with +1 edema, redness, erythema up  to the knee with lymphangitis up to the middle thigh.  Pulses were good  bilaterally.   ADMISSION LABORATORY DATA:  She had normal BMET, normal  CBC.  Lower  extremity Doppler showed superficial thrombophlebitis of the right greater  saphenous vein.  No profound thrombophlebitis.   HOSPITAL COURSE:  PROBLEM #1 -  CELLULITIS:  The patient was admitted to the  hospital.  She was placed on intravenous antibiotics on third generation  cephalosporins.  We felt that this cellulitis was pretty obviously gram  positive related episode with bouts of _____ cocci related episode.  The leg  was elevated and she was treated symptomatically for the pain.  By the  second day of admission, the erythema was already get better, lymphangitis  disappeared, and she was complaining of less pain.  The patient postponed a  trip to [facility] and she is going to continue using oral antibiotics,  Keflex by mouth for a week at home, and she is going to report back in case  there is any worsening of her symptoms.  PROBLEM #2 -  HUMAN IMMUNODEFICIENCY VIRUS:  Last CD4 count and viral load  level were all within acceptable range.  Dr. Roxan Hockey is following these  very closely.  We continued her medications while in the hospital.     Lonia Blood, M.D.                          Alvester Morin, M.D.    SL/MEDQ  D:  08/20/2002  T:  08/20/2002  Job:  [id number]   cc:   Rockey Situ. Flavia Shipper., M.D.  [address]  [place]  [place] [address]  Fax: [phone]   [name]    cc:   Rockey Situ. Flavia Shipper., M.D.  [address]  [place]  [place] [address]  Fax: [phone]   [name]

## 2010-07-10 NOTE — Op Note (Signed)
[place]  Patient:    CROSBY, BEVAN                       MRN: [id number] Proc. Date: 07/17/99 Adm. Date:  [id number] Disc. 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  OPERATION PERFORMED:  Repair of ventral incisional hernia with mesh.  SURGEON:  Milus Mallick, M.D.  ASSISTANT:  Adolph Pollack, M.D.  ANESTHESIA:  General endotracheal.  DESCRIPTION OF PROCEDURE:  Under adequate general endotracheal anesthesia, the patients abdomen was prepped and draped in the usual fashion.  There was a palpable mass to the right of the umbilicus that was the size of a lemon.  The patient was moderately obese and had a thick panniculus.  There was a transverse scar approximately four inches in length below the umbilicus which was the site of previous repair of ventral incisional hernia.  An incision was made through the previous scar and extended to about six inches in length. Bleeders were electrocoagulated.  Using Bovie electrocoagulation, the hernia sac was encountered.  It was found, however, that this was a much more extensive hernia than was appreciated preoperatively.  There were multiple sacs and multiple defects, several of which were incarcerated with omentum.  A Balfour self-retaining retractor was inserted into the wound.  The incision was deepened circumferentially around the entire process until fascia was exposed that was normal.  We then dissected all of the hernia sacs out for a total of approximately four different sacs that did not communicate with each other but communicated with the peritoneal cavity.  The entire area was then excised by excising the fascia all around this entire process.   The omentum that was adherent to the sacs was divided over Kelly clamps which were ligated with 3-0 Vicryl.  The specimen was then removed from the operative field. This left the patient with a defect that measured 8 cm in vertical direction and 10 cm in horizontal direction.  It was repaired by covering the defect with the omentum that was present and then inserting a portion of Marlex mesh that measured approximately 12 cm x 12 cm into the peritoneal cavity and placed in a position which was posterior to the abdominal wall.  It was then sewed in place with interrupted vertical mattress sutures of 0 Prolene.  A secure closure was felt to be present.  Hemostasis ascertained.  Bleeders were electrocoagulated.  A 10 mm Jackson-Pratt drain was placed in the wound through the inferior flap and sewed to the skin with 3-0 nylon.  The umbilicus was fixed down to the fascia with a single suture of 4-0 Vicryl.  The skin was reapproximated with the generic skin stapler 35-W.  Sterile dressing was applied.  Estimated blood loss for this procedure was approximately 150 cc. The patient tolerated the procedure well and left the operating room in satisfactory condition. DD:  07/17/99 TD:  07/22/99 Job: [id number]

## 2010-09-25 ENCOUNTER — Ambulatory Visit: Payer: 59 | Admitting: Physician Assistant

## 2010-10-02 ENCOUNTER — Ambulatory Visit: Payer: 59 | Admitting: Obstetrics & Gynecology

## 2010-10-16 ENCOUNTER — Ambulatory Visit (INDEPENDENT_AMBULATORY_CARE_PROVIDER_SITE_OTHER): Payer: 59 | Admitting: Obstetrics and Gynecology

## 2010-10-16 ENCOUNTER — Encounter: Payer: Self-pay | Admitting: Obstetrics and Gynecology

## 2010-10-16 VITALS — BP 147/95 | HR 86 | Temp 97.0°F | Ht 66.0 in | Wt 244.9 lb

## 2010-10-16 DIAGNOSIS — B20 Human immunodeficiency virus [HIV] disease: Secondary | ICD-10-CM

## 2010-10-16 DIAGNOSIS — Z21 Asymptomatic human immunodeficiency virus [HIV] infection status: Secondary | ICD-10-CM

## 2010-10-16 DIAGNOSIS — Z124 Encounter for screening for malignant neoplasm of cervix: Secondary | ICD-10-CM

## 2010-10-16 NOTE — Progress Notes (Signed)
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 I've told her after 3 Pap smears normally we go 3 years between Pap smears but in her case I think it would be better to start every other year. As no complaints no bleeding for several years. Her medications and not changed the only thing she takes is a crypt of once a day for her HIV. Examination the external genitalia was normal BUS within normal limits vagina was clean with loss of rotations. Cervix is clean the signs of atrophy Pap smear was taken. The uterus and adnexa could not be well outlined because of the habitus of the patient.

## 2010-11-17 LAB — T-HELPER CELL (CD4) - (RCID CLINIC ONLY)
CD4 % Helper T Cell: 48
CD4 T Cell Abs: 770

## 2010-11-23 LAB — T-HELPER CELL (CD4) - (RCID CLINIC ONLY)
CD4 % Helper T Cell: 43
CD4 T Cell Abs: 730

## 2010-12-03 LAB — T-HELPER CELL (CD4) - (RCID CLINIC ONLY)
CD4 % Helper T Cell: 43
CD4 T Cell Abs: 700

## 2011-02-22 ENCOUNTER — Other Ambulatory Visit: Payer: 59

## 2011-02-24 ENCOUNTER — Other Ambulatory Visit: Payer: Self-pay | Admitting: Infectious Diseases

## 2011-02-24 DIAGNOSIS — Z79899 Other long term (current) drug therapy: Secondary | ICD-10-CM

## 2011-02-24 DIAGNOSIS — Z113 Encounter for screening for infections with a predominantly sexual mode of transmission: Secondary | ICD-10-CM

## 2011-02-24 DIAGNOSIS — B20 Human immunodeficiency virus [HIV] disease: Secondary | ICD-10-CM

## 2011-02-25 ENCOUNTER — Other Ambulatory Visit: Payer: Self-pay | Admitting: Infectious Diseases

## 2011-02-25 ENCOUNTER — Other Ambulatory Visit (INDEPENDENT_AMBULATORY_CARE_PROVIDER_SITE_OTHER): Payer: 59

## 2011-02-25 DIAGNOSIS — Z79899 Other long term (current) drug therapy: Secondary | ICD-10-CM

## 2011-02-25 DIAGNOSIS — Z21 Asymptomatic human immunodeficiency virus [HIV] infection status: Secondary | ICD-10-CM

## 2011-02-25 DIAGNOSIS — Z113 Encounter for screening for infections with a predominantly sexual mode of transmission: Secondary | ICD-10-CM

## 2011-02-25 DIAGNOSIS — B20 Human immunodeficiency virus [HIV] disease: Secondary | ICD-10-CM

## 2011-02-25 LAB — CBC WITH DIFFERENTIAL/PLATELET
Basophils Absolute: 0 10*3/uL (ref 0.0–0.1)
Basophils Relative: 0 % (ref 0–1)
Eosinophils Absolute: 0.2 10*3/uL (ref 0.0–0.7)
Eosinophils Relative: 3 % (ref 0–5)
HCT: 40.4 % (ref 36.0–46.0)
Hemoglobin: 13.1 g/dL (ref 12.0–15.0)
Lymphocytes Relative: 26 % (ref 12–46)
Lymphs Abs: 1.8 10*3/uL (ref 0.7–4.0)
MCH: 30 pg (ref 26.0–34.0)
MCHC: 32.4 g/dL (ref 30.0–36.0)
MCV: 92.4 fL (ref 78.0–100.0)
Monocytes Absolute: 0.5 10*3/uL (ref 0.1–1.0)
Monocytes Relative: 7 % (ref 3–12)
Neutro Abs: 4.4 10*3/uL (ref 1.7–7.7)
Neutrophils Relative %: 64 % (ref 43–77)
Platelets: 283 10*3/uL (ref 150–400)
RBC: 4.37 MIL/uL (ref 3.87–5.11)
RDW: 14 % (ref 11.5–15.5)
WBC: 6.9 10*3/uL (ref 4.0–10.5)

## 2011-02-25 LAB — COMPREHENSIVE METABOLIC PANEL
ALT: 20 U/L (ref 0–35)
AST: 19 U/L (ref 0–37)
Albumin: 4.6 g/dL (ref 3.5–5.2)
Alkaline Phosphatase: 131 U/L — ABNORMAL HIGH (ref 39–117)
BUN: 19 mg/dL (ref 6–23)
CO2: 29 mEq/L (ref 19–32)
Calcium: 9.5 mg/dL (ref 8.4–10.5)
Chloride: 104 mEq/L (ref 96–112)
Creat: 0.8 mg/dL (ref 0.50–1.10)
Glucose, Bld: 105 mg/dL — ABNORMAL HIGH (ref 70–99)
Potassium: 4.5 mEq/L (ref 3.5–5.3)
Sodium: 140 mEq/L (ref 135–145)
Total Bilirubin: 0.3 mg/dL (ref 0.3–1.2)
Total Protein: 7.2 g/dL (ref 6.0–8.3)

## 2011-02-25 LAB — LIPID PANEL
Cholesterol: 230 mg/dL — ABNORMAL HIGH (ref 0–200)
HDL: 82 mg/dL (ref >39)
LDL Cholesterol: 140 mg/dL — ABNORMAL HIGH (ref 0–99)
Total CHOL/HDL Ratio: 2.8 Ratio
Triglycerides: 41 mg/dL (ref <150)
VLDL: 8 mg/dL (ref 0–40)

## 2011-02-25 LAB — URINALYSIS, ROUTINE W REFLEX MICROSCOPIC
Bilirubin Urine: NEGATIVE
Glucose, UA: NEGATIVE mg/dL
Hgb urine dipstick: NEGATIVE
Ketones, ur: NEGATIVE mg/dL
Leukocytes, UA: NEGATIVE
Nitrite: NEGATIVE
Protein, ur: NEGATIVE mg/dL
Specific Gravity, Urine: 1.006 (ref 1.005–1.030)
Urobilinogen, UA: 0.2 mg/dL (ref 0.0–1.0)
pH: 6.5 (ref 5.0–8.0)

## 2011-02-25 LAB — RPR

## 2011-02-26 LAB — GC/CHLAMYDIA PROBE AMP, URINE
Chlamydia, Swab/Urine, PCR: NEGATIVE
GC Probe Amp, Urine: NEGATIVE

## 2011-02-26 LAB — T-HELPER CELL (CD4) - (RCID CLINIC ONLY)
CD4 % Helper T Cell: 42 % (ref 33–55)
CD4 T Cell Abs: 780 uL (ref 400–2700)

## 2011-03-01 LAB — HIV-1 RNA QUANT-NO REFLEX-BLD
HIV 1 RNA Quant: <20 copies/mL (ref <20)
HIV-1 RNA Quant, Log: <1.3 {Log} (ref <1.30)

## 2011-03-08 ENCOUNTER — Ambulatory Visit (INDEPENDENT_AMBULATORY_CARE_PROVIDER_SITE_OTHER): Payer: 59 | Admitting: Infectious Diseases

## 2011-03-08 ENCOUNTER — Encounter: Payer: Self-pay | Admitting: Infectious Diseases

## 2011-03-08 DIAGNOSIS — Z118 Encounter for screening for other infectious and parasitic diseases: Secondary | ICD-10-CM

## 2011-03-08 DIAGNOSIS — E669 Obesity, unspecified: Secondary | ICD-10-CM

## 2011-03-08 DIAGNOSIS — Z79899 Other long term (current) drug therapy: Secondary | ICD-10-CM

## 2011-03-08 DIAGNOSIS — R87612 Low grade squamous intraepithelial lesion on cytologic smear of cervix (LGSIL): Secondary | ICD-10-CM

## 2011-03-08 DIAGNOSIS — Z23 Encounter for immunization: Secondary | ICD-10-CM

## 2011-03-08 DIAGNOSIS — L408 Other psoriasis: Secondary | ICD-10-CM

## 2011-03-08 DIAGNOSIS — B20 Human immunodeficiency virus [HIV] disease: Secondary | ICD-10-CM

## 2011-03-08 MED ORDER — EFAVIRENZ-EMTRICITAB-TENOFOVIR 600-200-300 MG PO TABS: 1.0000 | ORAL_TABLET | Freq: Every day | ORAL | Status: DC

## 2011-03-08 MED ORDER — CLOBETASOL PROPIONATE 0.05 % EX SOLN: 1.0000 "application " | Freq: Two times a day (BID) | CUTANEOUS | Status: DC

## 2011-03-08 NOTE — Progress Notes (Signed)
Addended by: Wendall Mola A on: 03/08/2011 09:30 AM   Modules accepted: Orders

## 2011-03-08 NOTE — Assessment & Plan Note (Signed)
Refill her topical steroid .

## 2011-03-08 NOTE — Assessment & Plan Note (Addendum)
She is doing very well. Has significant lipodystrophy. PNVX updated.  Is offered condoms (refused), will rtc 1 year. Has regular PAP and mammo scheduled. meds refilled.

## 2011-03-08 NOTE — Progress Notes (Signed)
  Subjective:    Patient ID: Gail Edwards, female    DOB: August 06, 1955, 56 y.o.   MRN: [id number]  HPI 56 yo F with HIV+ since May 1991. Has been doing well since. On atripla for at least 5 years. CD4 780 and VL <20, Chol 230 (02-25-11). (03-12-10). Has PAP (10-16-10) nl. Mammo 03-05-10 (nl).   Without complaints.     Review of Systems  Constitutional: Negative for appetite change and unexpected weight change.  Gastrointestinal: Negative for diarrhea and constipation.  Genitourinary: Negative for difficulty urinating.       Objective:   Physical Exam  Constitutional: She appears well-developed and well-nourished.  HENT:  Head: Normocephalic.  Mouth/Throat: No oropharyngeal exudate.  Eyes: EOM are normal. Pupils are equal, round, and reactive to light.  Neck: Neck supple.  Cardiovascular: Normal rate, regular rhythm and normal heart sounds.   Pulmonary/Chest: Effort normal and breath sounds normal.  Abdominal: Soft. Bowel sounds are normal. There is no tenderness. There is no rebound.  Lymphadenopathy:    She has no cervical adenopathy.          Assessment & Plan:

## 2011-03-08 NOTE — Assessment & Plan Note (Signed)
Signed note for her to start going to [employer]

## 2011-03-08 NOTE — Assessment & Plan Note (Signed)
Has yearly exam, states her last was NL.

## 2011-08-16 ENCOUNTER — Encounter: Payer: Self-pay | Admitting: *Deleted

## 2011-08-16 ENCOUNTER — Other Ambulatory Visit: Payer: Self-pay | Admitting: Infectious Diseases

## 2011-08-16 DIAGNOSIS — Z1231 Encounter for screening mammogram for malignant neoplasm of breast: Secondary | ICD-10-CM

## 2011-09-08 ENCOUNTER — Ambulatory Visit (HOSPITAL_COMMUNITY)
Admission: RE | Admit: 2011-09-08 | Discharge: 2011-09-08 | Disposition: A | Discharge status: Home / Self Care | Payer: 59 | Source: Ambulatory Visit | Attending: Infectious Diseases | Admitting: Infectious Diseases

## 2011-09-08 DIAGNOSIS — Z1231 Encounter for screening mammogram for malignant neoplasm of breast: Secondary | ICD-10-CM | POA: Insufficient documentation

## 2011-10-18 ENCOUNTER — Encounter: Payer: Self-pay | Admitting: Obstetrics & Gynecology

## 2011-10-18 ENCOUNTER — Ambulatory Visit (INDEPENDENT_AMBULATORY_CARE_PROVIDER_SITE_OTHER): Payer: 59 | Admitting: Obstetrics & Gynecology

## 2011-10-18 VITALS — BP 166/93 | HR 91 | Temp 98.2°F | Ht 66.0 in | Wt 261.0 lb

## 2011-10-18 DIAGNOSIS — Z01419 Encounter for gynecological examination (general) (routine) without abnormal findings: Secondary | ICD-10-CM

## 2011-10-18 NOTE — Patient Instructions (Signed)
Pap Test A Pap test is a sampling of cells from a woman's cervix. The cervix is the opening between the vagina (birth canal) and the uterus (the bottom part of the womb). The cells are scraped from the cervix during a pelvic exam. These cells are then looked at under a microscope to see if the cells are normal or to see if a cancer is developing or there are changes that suggest a cancer will develop. Cervical dysplasia is a condition in which a woman has abnormal changes in the top layer of cells of her cervix. These changes are an early sign that cervical cancer may develop. Pap tests also look for the human papilloma virus (HPV) because it has 4 types that are responsible for 70% of cervical cancer. Infections can also be found during a Pap test such as bacteria, fungus, protozoa and viruses.  Cervical cancer is harder to treat and less likely to have a good outcome if left untreated. Catching the disease at an early stage leads to a better outcome. Since the Pap test was introduced 60 years ago, deaths from cervical cancer have decreased by 70%. Every woman should keep up to date with Pap tests. RISK FACTORS FOR CERVICAL CANCER INCLUDE:   Becoming sexually active before age 18.   Being the daughter of a woman who took diethylstilbestrol (DES) during pregnancy.   Having a sexual partner who has or has had cancer of the penis.   Having a sexual partner whose past partner had cervical cancer or cervical dysplasia (early cell changes which suggest a cancer may develop).   Having a weakened immune system. An example would be HIV or other immunodeficiency disorder.   Having had a sexually transmitted infection such as chlamydia, gonorrhea or HPV.   Having had an abnormal Pap or cancer of the vagina or vulva.   Having had more than one sexual partner.   A history of cervical cancer in a woman's sister or mother.   Not using condoms with new sexual partners.   Smoking.  WHO SHOULD HAVE PAP  TESTS  A Pap test is done to screen for cervical cancer.   The first Pap test should be done at age 21.   Between ages 21 and 29, Pap tests are repeated every 2 years.   Beginning at age 30, you are advised to have a Pap test every 3 years as long as your past 3 Pap tests have been normal.   Some women have medical problems that increase the chance of getting cervical cancer. Talk to your caregiver about these problems. It is especially important to talk to your caregiver if a new problem develops soon after your last Pap test. In these cases, your caregiver may recommend more frequent screening and Pap tests.   The above recommendations are the same for women who have or have not gotten the vaccine for HPV (Human Papillomavirus).   If you had a hysterectomy for a problem that was not a cancer or a condition that could lead to cancer, then you no longer need Pap tests. However, even if you no longer need a Pap test, a regular exam is a good idea to make sure no other problems are starting.    If you are between ages 65 and 70, and you have had normal Pap tests going back 10 years, you no longer need Pap tests. However, even if you no longer need a Pap test, a regular exam is a good idea   to make sure no other problems are starting.    If you have had past treatment for cervical cancer or a condition that could lead to cancer, you need Pap tests and screening for cancer for at least 20 years after your treatment.   If Pap tests have been discontinued, risk factors (such as a new sexual partner) need to be re-assessed to determine if screening should be resumed.   Some women may need screenings more often if they are at high risk for cervical cancer.  PREPARATION FOR A PAP TEST A Pap test should be performed during the weeks before the start of menstruation. Women should not douche or have sexual intercourse for 24 hours before the test. No vaginal creams, diaphragms, or tampons should be  used for 24 hours before the test. To minimize discomfort, a woman should empty her bladder just before the exam. TAKING THE PAP TEST The caregiver will perform a pelvic exam. A metal or plastic instrument (speculum) is placed in the vagina. This is done before your caregiver does a bimanual exam of your internal female organs. This instrument allows your caregiver to see the inside of the vagina and look at the cervix. A small, sterile brush is used to take a sample of cells from the internal opening of the cervix. A small wooden spatula is used to scrape the outside of the cervix. Neither of these two methods to collect cells will cause you pain. These two scrapings are placed on a glass slide or in a small bottle filled with a special liquid. The cells are looked at later under a microscope in a lab. A specialist will look at these cells and determine if the cells are normal. RESULTS OF YOUR PAP TEST  A healthy Pap test shows no abnormal cells or evidence of inflammation.   The presence of abnormally growing cells on the surface of the cervix may be reported as an abnormal Pap test. Different categories of findings are used to describe your Pap test. Your caregiver will go over the importance of these findings with you. The caregiver will then determine what follow-up is needed or when you should have your next pap test.   If you have had two or more abnormal Pap tests:   You may be asked to have a colposcopy. This is a test in which the cervix is viewed with a special lighted microscope.   A cervical tissue sample (biopsy) may also be needed. This involves taking a small tissue sample from the cervix. The sample is looked at under a microscope to find the cause of the abnormal cells. Make sure you find out the results of the Pap test. If you have not received the results within two weeks, contact your caregiver's office for the results. Do not assume everything is normal if you have not heard from  your caregiver or medical facility. It is important to follow up on all of your test results.  Document Released: 05/01/2002 Document Revised: 01/28/2011 Document Reviewed: 02/02/2011 [employer] Patient Information 2012 [employer]. 

## 2011-10-19 NOTE — Progress Notes (Signed)
Patient ID: Gail Edwards, female   DOB: [birth year], 56 y.o.   MRN: [id number]  Chief Complaint  Patient presents with  . Gynecologic Exam    HPI Gail Edwards is a 56 y.o. female. Pt presents for annual GYN exam.  No GYN problems at present.   HPI  Past Medical History  Diagnosis Date  . HIV infection   . Abnormal Pap smear 2009    [id number]    Past Surgical History  Procedure Date  . Cesarean section   . Cholecystectomy   . Hernia repair     Family History  Problem Relation Age of Onset  . Diabetes Mother   . Cancer Father     brain    Social History History  Substance Use Topics  . Smoking status: Never Smoker   . Smokeless tobacco: Never Used  . Alcohol Use: No    Allergies  Allergen Reactions  . Codeine     Current Outpatient Prescriptions  Medication Sig Dispense Refill  . clobetasol (TEMOVATE) 0.05 % external solution Apply 1 application topically 2 (two) times daily. Disp 90 days  50 mL  12  . efavirenz-emtrictabine-tenofovir (ATRIPLA) 600-200-300 MG per tablet Take 1 tablet by mouth daily.  90 tablet  12    Review of Systems Review of Systems  Mammogram earlier this summer  Blood pressure 166/93, pulse 91, temperature 98.2 F (36.8 C), temperature source Oral, height 5\' 6"  (1.676 m), weight 261 lb (118.389 kg).  Physical Exam Physical Exam Lungs: CTA CV: RRR Abd: obese, NT, ND GU: EGBUS: no lesions Vagina: no blood in vault Cervix: no lesion; no mucopurulent d/c. PAP obtained Uterus: small, mobile Adnexa: no masses; difficult to assess due to body habitus  Rectal: no masses  Assessment    Annual GYN exam- no problems identified    Plan    F/u PAP and HPV F/u cx F/u 1 year or sooner prn  Daesha Insco L. Harraway-Smith, M.D., [facility], [name] 10/19/2011, 11:32 AM

## 2011-11-09 ENCOUNTER — Other Ambulatory Visit: Payer: 59

## 2011-11-09 DIAGNOSIS — Z79899 Other long term (current) drug therapy: Secondary | ICD-10-CM

## 2011-11-09 DIAGNOSIS — B20 Human immunodeficiency virus [HIV] disease: Secondary | ICD-10-CM

## 2011-11-09 DIAGNOSIS — Z118 Encounter for screening for other infectious and parasitic diseases: Secondary | ICD-10-CM

## 2011-11-09 LAB — LIPID PANEL
Cholesterol: 181 mg/dL (ref 0–200)
HDL: 56 mg/dL (ref >39)
LDL Cholesterol: 116 mg/dL — ABNORMAL HIGH (ref 0–99)
Total CHOL/HDL Ratio: 3.2 Ratio
Triglycerides: 46 mg/dL (ref <150)
VLDL: 9 mg/dL (ref 0–40)

## 2011-11-09 LAB — COMPREHENSIVE METABOLIC PANEL
ALT: 19 U/L (ref 0–35)
AST: 14 U/L (ref 0–37)
Albumin: 4.1 g/dL (ref 3.5–5.2)
Alkaline Phosphatase: 146 U/L — ABNORMAL HIGH (ref 39–117)
BUN: 12 mg/dL (ref 6–23)
CO2: 27 mEq/L (ref 19–32)
Calcium: 9.4 mg/dL (ref 8.4–10.5)
Chloride: 104 mEq/L (ref 96–112)
Creat: 0.82 mg/dL (ref 0.50–1.10)
Glucose, Bld: 113 mg/dL — ABNORMAL HIGH (ref 70–99)
Potassium: 4.7 mEq/L (ref 3.5–5.3)
Sodium: 140 mEq/L (ref 135–145)
Total Bilirubin: 0.3 mg/dL (ref 0.3–1.2)
Total Protein: 6.7 g/dL (ref 6.0–8.3)

## 2011-11-09 LAB — CBC
HCT: 38.6 % (ref 36.0–46.0)
Hemoglobin: 12.8 g/dL (ref 12.0–15.0)
MCH: 28.8 pg (ref 26.0–34.0)
MCHC: 33.2 g/dL (ref 30.0–36.0)
MCV: 86.7 fL (ref 78.0–100.0)
Platelets: 332 10*3/uL (ref 150–400)
RBC: 4.45 MIL/uL (ref 3.87–5.11)
RDW: 13.8 % (ref 11.5–15.5)
WBC: 6.9 10*3/uL (ref 4.0–10.5)

## 2011-11-10 LAB — HIV-1 RNA QUANT-NO REFLEX-BLD
HIV 1 RNA Quant: <20 copies/mL (ref <20)
HIV-1 RNA Quant, Log: <1.3 {Log} (ref <1.30)

## 2011-11-10 LAB — T-HELPER CELL (CD4) - (RCID CLINIC ONLY)
CD4 % Helper T Cell: 41 % (ref 33–55)
CD4 T Cell Abs: 760 uL (ref 400–2700)

## 2011-11-10 LAB — RPR

## 2011-11-23 ENCOUNTER — Ambulatory Visit: Payer: 59 | Admitting: Infectious Diseases

## 2011-11-25 ENCOUNTER — Encounter: Payer: Self-pay | Admitting: Infectious Diseases

## 2011-11-25 ENCOUNTER — Ambulatory Visit (INDEPENDENT_AMBULATORY_CARE_PROVIDER_SITE_OTHER): Payer: 59 | Admitting: Infectious Diseases

## 2011-11-25 VITALS — BP 168/112 | HR 91 | Temp 97.6°F | Wt 266.0 lb

## 2011-11-25 DIAGNOSIS — Z113 Encounter for screening for infections with a predominantly sexual mode of transmission: Secondary | ICD-10-CM

## 2011-11-25 DIAGNOSIS — Z79899 Other long term (current) drug therapy: Secondary | ICD-10-CM

## 2011-11-25 DIAGNOSIS — E669 Obesity, unspecified: Secondary | ICD-10-CM

## 2011-11-25 DIAGNOSIS — B20 Human immunodeficiency virus [HIV] disease: Secondary | ICD-10-CM

## 2011-11-25 DIAGNOSIS — I10 Essential (primary) hypertension: Secondary | ICD-10-CM | POA: Insufficient documentation

## 2011-11-25 MED ORDER — EFAVIRENZ-EMTRICITAB-TENOFOVIR 600-200-300 MG PO TABS: 1.0000 | ORAL_TABLET | Freq: Every day | ORAL | Status: DC

## 2011-11-25 MED ORDER — CLOBETASOL PROPIONATE 0.05 % EX SOLN: 1.0000 "application " | Freq: Two times a day (BID) | CUTANEOUS | Status: DC

## 2011-11-25 NOTE — Assessment & Plan Note (Signed)
She is doing very well. Will cont her current meds. Offered condoms/refused. Got flu shot at [facility]. rtc 6 months

## 2011-11-25 NOTE — Assessment & Plan Note (Signed)
She is asx, normal Cr. Hopefully wt loss will help. Again, she has her BP checked at work and it is normal. Will watch, probably "wihte coat hypertension".

## 2011-11-25 NOTE — Assessment & Plan Note (Addendum)
She is going to go to [employer] and start yoga. Her personal spending acct form is signed.

## 2011-11-25 NOTE — Progress Notes (Signed)
  Subjective:    Patient ID: Gail Edwards, female    DOB: Jul 18, 1955, 56 y.o.   MRN: [id number]  HPI 56 yo F with HIV+ since May 1991. Has been doing well since. On atripla for at least 5 years. No problems with meds. Had PAP recently, nl. Gyn told that she is to have every other year.   HIV 1 RNA Quant (copies/mL)  Date Value  11/09/2011 <20   02/25/2011 <20   03/02/2010 <20 copies/mL      CD4 T Cell Abs (cmm)  Date Value  11/09/2011 760   02/25/2011 780   03/02/2010 840    States she had biometric screen at work and had 120/70.    Review of Systems  Constitutional: Negative for appetite change and unexpected weight change.  Respiratory: Negative for chest tightness and shortness of breath.   Cardiovascular: Negative for chest pain and leg swelling.  Gastrointestinal: Negative for diarrhea and constipation.  Genitourinary: Negative for dysuria.  Neurological: Negative for headaches.       Objective:   Physical Exam  Constitutional: She appears well-developed and well-nourished.  HENT:  Mouth/Throat: No oropharyngeal exudate.  Eyes: EOM are normal. Pupils are equal, round, and reactive to light.  Neck: Neck supple.  Cardiovascular: Normal rate, regular rhythm and normal heart sounds.   Pulmonary/Chest: Effort normal and breath sounds normal.  Abdominal: Soft. Bowel sounds are normal. There is no tenderness. There is no rebound.  Musculoskeletal: She exhibits no edema.  Lymphadenopathy:    She has no cervical adenopathy.          Assessment & Plan:

## 2012-05-15 ENCOUNTER — Other Ambulatory Visit: Payer: 59

## 2012-05-29 ENCOUNTER — Ambulatory Visit (INDEPENDENT_AMBULATORY_CARE_PROVIDER_SITE_OTHER): Payer: 59 | Admitting: Infectious Diseases

## 2012-05-29 ENCOUNTER — Encounter: Payer: Self-pay | Admitting: Infectious Diseases

## 2012-05-29 VITALS — BP 136/89 | HR 80 | Temp 97.6°F | Ht 66.0 in | Wt 251.0 lb

## 2012-05-29 DIAGNOSIS — R7309 Other abnormal glucose: Secondary | ICD-10-CM

## 2012-05-29 DIAGNOSIS — I10 Essential (primary) hypertension: Secondary | ICD-10-CM

## 2012-05-29 DIAGNOSIS — B20 Human immunodeficiency virus [HIV] disease: Secondary | ICD-10-CM

## 2012-05-29 DIAGNOSIS — G4700 Insomnia, unspecified: Secondary | ICD-10-CM

## 2012-05-29 DIAGNOSIS — R739 Hyperglycemia, unspecified: Secondary | ICD-10-CM | POA: Insufficient documentation

## 2012-05-29 MED ORDER — TEMAZEPAM 15 MG PO CAPS: 15.0000 mg | ORAL_CAPSULE | Freq: Every evening | ORAL | Status: DC | PRN

## 2012-05-29 NOTE — Progress Notes (Signed)
  Subjective:    Patient ID: Gail Edwards, female    DOB: 1955-11-16, 57 y.o.   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 Has been doing well since. On atripla. No problems with meds. Had PAP nl (10-18-11).  Physically feels well, mentally has been taxed- her son/daughter in law, 53 year old have moved back in after their 47 month old died. Husband has been out of work due to fall. Has been having difficulty with finances as well (he may loose his job).  Having trouble sleeping, worrying. Has taken ambien before, would like benzo. CD4 854, HIV RNA und (05-17-12) Glc 123   HIV 1 RNA Quant (copies/mL)  Date Value  11/09/2011 <20   02/25/2011 <20   03/02/2010 <20 copies/mL      CD4 T Cell Abs (cmm)  Date Value  11/09/2011 760   02/25/2011 780   03/02/2010 840      Review of Systems  Constitutional: Negative for appetite change and unexpected weight change.  Gastrointestinal: Negative for diarrhea and constipation.  Genitourinary: Negative for difficulty urinating.  Neurological: Negative for numbness.  Psychiatric/Behavioral: Positive for sleep disturbance.       Objective:   Physical Exam  Constitutional: She appears well-developed and well-nourished.  HENT:  Mouth/Throat: No oropharyngeal exudate.  Eyes: EOM are normal. Pupils are equal, round, and reactive to light.  Neck: Neck supple.  Cardiovascular: Normal rate, regular rhythm and normal heart sounds.   Pulmonary/Chest: Effort normal and breath sounds normal.  Abdominal: Soft. Bowel sounds are normal. There is no tenderness. There is no rebound.  Lymphadenopathy:    She has no cervical adenopathy.          Assessment & Plan:

## 2012-05-29 NOTE — Assessment & Plan Note (Signed)
She is doing very well. She will get her pap and mammo repeat this year (she will set up). i offered to set her up for colonoscopy but she defers. Will see her back in 6 months.

## 2012-05-29 NOTE — Assessment & Plan Note (Signed)
Will try restoril.

## 2012-05-29 NOTE — Assessment & Plan Note (Signed)
Will cont to watch, she is aware of diet and exercise.

## 2012-05-29 NOTE — Assessment & Plan Note (Signed)
BP good today off medications. wil continue to monitor.

## 2012-06-15 ENCOUNTER — Encounter: Payer: Self-pay | Admitting: Infectious Diseases

## 2012-09-20 ENCOUNTER — Other Ambulatory Visit: Payer: Self-pay | Admitting: Infectious Diseases

## 2012-09-20 DIAGNOSIS — Z1231 Encounter for screening mammogram for malignant neoplasm of breast: Secondary | ICD-10-CM

## 2012-10-20 ENCOUNTER — Ambulatory Visit (HOSPITAL_COMMUNITY)
Admission: RE | Admit: 2012-10-20 | Discharge: 2012-10-20 | Disposition: A | Discharge status: Home / Self Care | Payer: 59 | Source: Ambulatory Visit | Attending: Infectious Diseases | Admitting: Infectious Diseases

## 2012-10-20 DIAGNOSIS — Z1231 Encounter for screening mammogram for malignant neoplasm of breast: Secondary | ICD-10-CM | POA: Insufficient documentation

## 2012-11-29 ENCOUNTER — Other Ambulatory Visit: Payer: Self-pay | Admitting: Infectious Diseases

## 2012-11-29 DIAGNOSIS — B20 Human immunodeficiency virus [HIV] disease: Secondary | ICD-10-CM

## 2012-12-06 ENCOUNTER — Encounter: Payer: Self-pay | Admitting: Infectious Diseases

## 2012-12-06 ENCOUNTER — Other Ambulatory Visit: Payer: Self-pay | Admitting: *Deleted

## 2012-12-06 ENCOUNTER — Ambulatory Visit (INDEPENDENT_AMBULATORY_CARE_PROVIDER_SITE_OTHER): Payer: 59 | Admitting: Infectious Diseases

## 2012-12-06 VITALS — BP 157/89 | HR 90 | Temp 98.0°F | Ht 66.0 in | Wt 271.0 lb

## 2012-12-06 DIAGNOSIS — E669 Obesity, unspecified: Secondary | ICD-10-CM

## 2012-12-06 DIAGNOSIS — R7309 Other abnormal glucose: Secondary | ICD-10-CM

## 2012-12-06 DIAGNOSIS — R87612 Low grade squamous intraepithelial lesion on cytologic smear of cervix (LGSIL): Secondary | ICD-10-CM

## 2012-12-06 DIAGNOSIS — G4700 Insomnia, unspecified: Secondary | ICD-10-CM

## 2012-12-06 DIAGNOSIS — B20 Human immunodeficiency virus [HIV] disease: Secondary | ICD-10-CM

## 2012-12-06 DIAGNOSIS — R739 Hyperglycemia, unspecified: Secondary | ICD-10-CM

## 2012-12-06 DIAGNOSIS — I10 Essential (primary) hypertension: Secondary | ICD-10-CM

## 2012-12-06 DIAGNOSIS — E785 Hyperlipidemia, unspecified: Secondary | ICD-10-CM

## 2012-12-06 MED ORDER — TEMAZEPAM 15 MG PO CAPS: 15.0000 mg | ORAL_CAPSULE | Freq: Every evening | ORAL | Status: DC | PRN

## 2012-12-06 MED ORDER — EFAVIRENZ-EMTRICITAB-TENOFOVIR 600-200-300 MG PO TABS: 1.0000 | ORAL_TABLET | Freq: Every day | ORAL | Status: DC

## 2012-12-06 NOTE — Assessment & Plan Note (Signed)
Last exam normal, her Gyn only wants to do every 2 years. Will allow (we discussed that this is off guidelines).

## 2012-12-06 NOTE — Assessment & Plan Note (Signed)
Encouraged her to diet and exercise.  

## 2012-12-06 NOTE — Assessment & Plan Note (Signed)
Encouraged diet and exercise.  

## 2012-12-06 NOTE — Assessment & Plan Note (Signed)
restoril refilled.  

## 2012-12-06 NOTE — Assessment & Plan Note (Signed)
She is doing very well. Getting her labs at work. Offered/refused condoms. She will get flu shot at work. atripla refilled. Offered/refused colonoscopy. Will see her back in 6 months.

## 2012-12-06 NOTE — Progress Notes (Signed)
  Subjective:    Patient ID: Gail Edwards, female    DOB: 1955/05/04, 57 y.o.   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 Has been doing well since. On atripla. No problems with meds. Had PAP nl (10-18-11).  Physically feels well, mentally has been better.  CD4 901, HIV RNA (11-21-12)  GLC 124 Chol 211, LDL 132. Would like refill of sleeping rx.  Has not had PAP this year, would like to do every 2 years. Prev were normal.  Has had mammo this year. Getting flu at work. Defers colon  Review of Systems  Constitutional: Negative for fever, chills, appetite change and unexpected weight change.  Gastrointestinal: Negative for diarrhea and constipation.  Genitourinary: Negative for difficulty urinating and menstrual problem.       Ammenorrheic       Objective:   Physical Exam  Constitutional: She appears well-developed and well-nourished.  HENT:  Mouth/Throat: No oropharyngeal exudate.  Eyes: EOM are normal. Pupils are equal, round, and reactive to light.  Neck: Neck supple.  Cardiovascular: Normal rate, regular rhythm and normal heart sounds.   Pulmonary/Chest: Effort normal and breath sounds normal.  Abdominal: Soft. Bowel sounds are normal. There is no tenderness. There is no rebound.  Musculoskeletal: She exhibits no edema.  Lymphadenopathy:    She has no cervical adenopathy.          Assessment & Plan:

## 2012-12-06 NOTE — Assessment & Plan Note (Signed)
Encourage diet and exercise. 

## 2012-12-08 ENCOUNTER — Encounter: Payer: Self-pay | Admitting: *Deleted

## 2012-12-28 ENCOUNTER — Other Ambulatory Visit: Payer: Self-pay

## 2012-12-29 ENCOUNTER — Encounter: Payer: Self-pay | Admitting: Infectious Diseases

## 2013-02-20 ENCOUNTER — Telehealth: Payer: Self-pay | Admitting: *Deleted

## 2013-02-20 NOTE — Telephone Encounter (Signed)
Left message requesting pt call [facility] for annual exam and pap smear appt.

## 2013-03-13 ENCOUNTER — Telehealth: Payer: Self-pay | Admitting: Licensed Clinical Social Worker

## 2013-03-13 NOTE — Telephone Encounter (Signed)
Patient needs lab orders on prescription paper for upcoming appointment.

## 2013-06-11 ENCOUNTER — Ambulatory Visit (INDEPENDENT_AMBULATORY_CARE_PROVIDER_SITE_OTHER): Payer: 59 | Admitting: Infectious Diseases

## 2013-06-11 ENCOUNTER — Telehealth: Payer: Self-pay | Admitting: *Deleted

## 2013-06-11 ENCOUNTER — Encounter: Payer: Self-pay | Admitting: Infectious Diseases

## 2013-06-11 VITALS — BP 179/111 | HR 101 | Temp 98.0°F | Ht 66.0 in | Wt 270.0 lb

## 2013-06-11 DIAGNOSIS — Z23 Encounter for immunization: Secondary | ICD-10-CM

## 2013-06-11 DIAGNOSIS — R739 Hyperglycemia, unspecified: Secondary | ICD-10-CM

## 2013-06-11 DIAGNOSIS — E785 Hyperlipidemia, unspecified: Secondary | ICD-10-CM

## 2013-06-11 DIAGNOSIS — E669 Obesity, unspecified: Secondary | ICD-10-CM

## 2013-06-11 DIAGNOSIS — I10 Essential (primary) hypertension: Secondary | ICD-10-CM

## 2013-06-11 DIAGNOSIS — R7309 Other abnormal glucose: Secondary | ICD-10-CM

## 2013-06-11 DIAGNOSIS — B20 Human immunodeficiency virus [HIV] disease: Secondary | ICD-10-CM

## 2013-06-11 DIAGNOSIS — G4700 Insomnia, unspecified: Secondary | ICD-10-CM

## 2013-06-11 MED ORDER — EFAVIRENZ-EMTRICITAB-TENOFOVIR 600-200-300 MG PO TABS: 1.0000 | ORAL_TABLET | Freq: Every day | ORAL | Status: DC

## 2013-06-11 MED ORDER — TEMAZEPAM 15 MG PO CAPS: 15.0000 mg | ORAL_CAPSULE | Freq: Every evening | ORAL | Status: DC | PRN

## 2013-06-11 MED ORDER — CLOBETASOL PROPIONATE 0.05 % EX SOLN: 1.0000 "application " | Freq: Two times a day (BID) | CUTANEOUS | Status: DC

## 2013-06-11 NOTE — Telephone Encounter (Signed)
Notified patient of appt with [facility] for PCP with Dr. Ihor GullyJarella on 06/18/13 at 10:00 pm. Office note faxed. Wendall MolaJacqueline Spruha Weight

## 2013-06-11 NOTE — Assessment & Plan Note (Signed)
She is aware she needs to lose wt. She will meet with PCP to see if we can improve this.

## 2013-06-11 NOTE — Assessment & Plan Note (Signed)
She is hep B S Ab (-), was vaccinated in 2003. Will repeat her series. She is doing very well. Will PAP later this year. Still defers colonoscopy. Offered/refused condoms. See her back in 6 months.

## 2013-06-11 NOTE — Progress Notes (Signed)
   Subjective:    Patient ID: Gail Edwards, female    DOB: 1955/11/08, 58 y.o.   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 Has been doing well since. On atripla. No problems with meds. Had PAP nl (10-18-11), she wants to do every other year.  Worried about her insurance co-pay going up due to her wt. Wants to get PCP. Also worried about her BP today.  CD4 957, HIV RNA und, Chol 230 (LDL 151), Glc 126 (05-29-13).  Hep B S Ab negative.  Feels well. Has started walking on treadmill 20 min 3-4/week. Diet is irregular.   Review of Systems  Constitutional: Negative for appetite change and unexpected weight change.  Respiratory: Negative for shortness of breath.   Cardiovascular: Negative for chest pain.  Gastrointestinal: Negative for diarrhea and constipation.  Genitourinary: Negative for difficulty urinating.  Neurological: Negative for dizziness and headaches.       Objective:   Physical Exam  Constitutional: She appears well-developed and well-nourished.  HENT:  Mouth/Throat: No oropharyngeal exudate.  Eyes: EOM are normal. Pupils are equal, round, and reactive to light.  Neck: Neck supple.  Cardiovascular: Normal rate, regular rhythm and normal heart sounds.   Pulmonary/Chest: Effort normal and breath sounds normal.  Abdominal: Soft. Bowel sounds are normal. She exhibits no distension. There is no tenderness.  Musculoskeletal: She exhibits no edema.  Lymphadenopathy:    She has no cervical adenopathy.          Assessment & Plan:

## 2013-06-11 NOTE — Addendum Note (Signed)
Addended by: Wendall MolaOCKERHAM, Tari Lecount A on: 06/11/2013 09:56 AM   Modules accepted: Orders

## 2013-06-11 NOTE — Assessment & Plan Note (Signed)
Will get PCP.

## 2013-06-11 NOTE — Assessment & Plan Note (Signed)
I rechecked her BP- 138/81.  WIll get her in with PCP.

## 2013-06-11 NOTE — Assessment & Plan Note (Signed)
Will refill her restoril.  

## 2013-06-11 NOTE — Assessment & Plan Note (Signed)
Again, will meet with PCP. Would consider low dose lipitor.

## 2013-06-21 ENCOUNTER — Other Ambulatory Visit: Payer: Self-pay

## 2013-06-21 DIAGNOSIS — G4700 Insomnia, unspecified: Secondary | ICD-10-CM

## 2013-06-21 DIAGNOSIS — B20 Human immunodeficiency virus [HIV] disease: Secondary | ICD-10-CM

## 2013-06-21 MED ORDER — TEMAZEPAM 15 MG PO CAPS: 15.0000 mg | ORAL_CAPSULE | Freq: Every evening | ORAL | Status: DC | PRN

## 2013-06-21 MED ORDER — EFAVIRENZ-EMTRICITAB-TENOFOVIR 600-200-300 MG PO TABS: 1.0000 | ORAL_TABLET | Freq: Every day | ORAL | Status: DC

## 2013-06-21 NOTE — Telephone Encounter (Signed)
Patient calling stating Restoril and Stribild were not received by pharmacy .   She will need a 90 day supply on Restoril .  Medications resubmitted to pharmacy.    Laurell Josephsammy K Ayaat Jansma, RN

## 2013-06-26 ENCOUNTER — Other Ambulatory Visit: Payer: Self-pay | Admitting: Licensed Clinical Social Worker

## 2013-06-26 DIAGNOSIS — G4700 Insomnia, unspecified: Secondary | ICD-10-CM

## 2013-06-26 MED ORDER — TEMAZEPAM 15 MG PO CAPS: 15.0000 mg | ORAL_CAPSULE | Freq: Every evening | ORAL | Status: DC | PRN

## 2013-06-27 ENCOUNTER — Other Ambulatory Visit: Payer: Self-pay | Admitting: *Deleted

## 2013-06-27 DIAGNOSIS — G4700 Insomnia, unspecified: Secondary | ICD-10-CM

## 2013-06-27 MED ORDER — TEMAZEPAM 15 MG PO CAPS: 15.0000 mg | ORAL_CAPSULE | Freq: Every evening | ORAL | Status: DC | PRN

## 2013-07-02 ENCOUNTER — Encounter: Payer: Self-pay | Admitting: Infectious Diseases

## 2013-09-17 ENCOUNTER — Other Ambulatory Visit: Payer: Self-pay | Admitting: Infectious Diseases

## 2013-09-17 DIAGNOSIS — Z1231 Encounter for screening mammogram for malignant neoplasm of breast: Secondary | ICD-10-CM

## 2013-10-01 ENCOUNTER — Other Ambulatory Visit: Payer: Self-pay | Admitting: Internal Medicine

## 2013-10-01 ENCOUNTER — Other Ambulatory Visit (HOSPITAL_COMMUNITY)
Admission: RE | Admit: 2013-10-01 | Discharge: 2013-10-01 | Disposition: A | Discharge status: Home / Self Care | Payer: 59 | Source: Ambulatory Visit | Attending: Internal Medicine | Admitting: Internal Medicine

## 2013-10-01 DIAGNOSIS — Z01419 Encounter for gynecological examination (general) (routine) without abnormal findings: Secondary | ICD-10-CM | POA: Insufficient documentation

## 2013-10-03 LAB — CYTOLOGY - PAP

## 2013-10-19 ENCOUNTER — Encounter: Payer: Self-pay | Admitting: Infectious Diseases

## 2013-10-26 ENCOUNTER — Ambulatory Visit (HOSPITAL_COMMUNITY)
Admission: RE | Admit: 2013-10-26 | Discharge: 2013-10-26 | Disposition: A | Discharge status: Home / Self Care | Payer: 59 | Source: Ambulatory Visit | Attending: Infectious Diseases | Admitting: Infectious Diseases

## 2013-10-26 DIAGNOSIS — Z1231 Encounter for screening mammogram for malignant neoplasm of breast: Secondary | ICD-10-CM | POA: Insufficient documentation

## 2013-12-12 ENCOUNTER — Ambulatory Visit: Payer: 59 | Admitting: Infectious Diseases

## 2013-12-17 ENCOUNTER — Ambulatory Visit: Payer: 59 | Admitting: Infectious Diseases

## 2013-12-17 ENCOUNTER — Encounter: Payer: Self-pay | Admitting: Infectious Diseases

## 2013-12-17 ENCOUNTER — Ambulatory Visit (INDEPENDENT_AMBULATORY_CARE_PROVIDER_SITE_OTHER): Payer: 59 | Admitting: Infectious Diseases

## 2013-12-17 VITALS — BP 169/94 | HR 92 | Temp 97.6°F | Wt 242.0 lb

## 2013-12-17 DIAGNOSIS — I10 Essential (primary) hypertension: Secondary | ICD-10-CM

## 2013-12-17 DIAGNOSIS — Z23 Encounter for immunization: Secondary | ICD-10-CM

## 2013-12-17 DIAGNOSIS — G4700 Insomnia, unspecified: Secondary | ICD-10-CM

## 2013-12-17 DIAGNOSIS — B20 Human immunodeficiency virus [HIV] disease: Secondary | ICD-10-CM

## 2013-12-17 DIAGNOSIS — E669 Obesity, unspecified: Secondary | ICD-10-CM

## 2013-12-17 MED ORDER — TEMAZEPAM 15 MG PO CAPS: 15.0000 mg | ORAL_CAPSULE | Freq: Every evening | ORAL | Status: DC | PRN

## 2013-12-17 MED ORDER — EFAVIRENZ-EMTRICITAB-TENOFOVIR 600-200-300 MG PO TABS
1.0000 | ORAL_TABLET | Freq: Every day | ORAL | Status: DC
Start: 2013-12-17 — End: 2014-11-25

## 2013-12-17 NOTE — Assessment & Plan Note (Signed)
Will refill her restoril.  

## 2013-12-17 NOTE — Progress Notes (Signed)
   Subjective:    Patient ID: Gail Edwards, female    DOB: 1955/08/01, 58 y.o.   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 Has been doing well since. On atripla. No problems with meds. Had PAP nl (Sept 2015), she wants to do every other year.  Has gotten IM at [facility], PCP.  CD4 753, HIV RNA und, Chol 195 (LDL 123) (12-05-13).  Has lost some wt. No sx from high bp. On no anti-htn rx.   Review of Systems  Constitutional: Negative for appetite change and unexpected weight change.  Respiratory: Negative for shortness of breath.   Cardiovascular: Negative for chest pain.  Gastrointestinal: Negative for diarrhea and constipation.  Genitourinary: Negative for difficulty urinating.  Neurological: Negative for headaches.       Objective:   Physical Exam  Constitutional: She appears well-developed and well-nourished.  HENT:  Mouth/Throat: No oropharyngeal exudate.  Eyes: EOM are normal. Pupils are equal, round, and reactive to light.  Neck: Neck supple.  Cardiovascular: Normal rate and regular rhythm.   Pulmonary/Chest: Effort normal and breath sounds normal.  Abdominal: Soft. Bowel sounds are normal. There is no tenderness.  Lymphadenopathy:    She has no cervical adenopathy.          Assessment & Plan:

## 2013-12-17 NOTE — Assessment & Plan Note (Signed)
Is doing very well. Has gotten flu shot. Hep b #2. Offered/refused condoms. Will see her back in 1 year.

## 2013-12-17 NOTE — Assessment & Plan Note (Signed)
Asx. Will f/u with her PCP

## 2013-12-17 NOTE — Addendum Note (Signed)
Addended by: Wendall MolaOCKERHAM, Kennedey Digilio A on: 12/17/2013 10:30 AM   Modules accepted: Orders

## 2013-12-17 NOTE — Assessment & Plan Note (Addendum)
Working on diet and exercise. Has lost 30#!

## 2013-12-21 ENCOUNTER — Telehealth: Payer: Self-pay | Admitting: *Deleted

## 2013-12-21 DIAGNOSIS — G4700 Insomnia, unspecified: Secondary | ICD-10-CM

## 2013-12-21 MED ORDER — TEMAZEPAM 15 MG PO CAPS: 15.0000 mg | ORAL_CAPSULE | Freq: Every evening | ORAL | Status: DC | PRN

## 2013-12-21 NOTE — Telephone Encounter (Signed)
Rx already filled on 10/262015.  Pharmacy did not receive the 12/17/13 phone in rx.  Restoril is limited to a 2375-month rx.  Changed number of refills to 1 with a 90-day quantity.

## 2013-12-24 ENCOUNTER — Encounter: Payer: Self-pay | Admitting: Infectious Diseases

## 2013-12-27 ENCOUNTER — Encounter: Payer: Self-pay | Admitting: Infectious Diseases

## 2014-05-02 ENCOUNTER — Other Ambulatory Visit: Payer: Self-pay | Admitting: *Deleted

## 2014-05-02 DIAGNOSIS — Z113 Encounter for screening for infections with a predominantly sexual mode of transmission: Secondary | ICD-10-CM

## 2014-08-22 ENCOUNTER — Telehealth: Payer: Self-pay | Admitting: *Deleted

## 2014-08-22 NOTE — Telephone Encounter (Signed)
Rx for labs sent to patient's home per her request. Gail Edwards, Sharae Zappulla M, RN

## 2014-09-23 ENCOUNTER — Other Ambulatory Visit (HOSPITAL_COMMUNITY): Payer: Self-pay | Admitting: Internal Medicine

## 2014-09-23 DIAGNOSIS — Z1231 Encounter for screening mammogram for malignant neoplasm of breast: Secondary | ICD-10-CM

## 2014-10-30 ENCOUNTER — Ambulatory Visit (HOSPITAL_COMMUNITY): Payer: 59

## 2014-11-13 ENCOUNTER — Ambulatory Visit (HOSPITAL_COMMUNITY)
Admission: RE | Admit: 2014-11-13 | Discharge: 2014-11-13 | Disposition: A | Discharge status: Home / Self Care | Payer: 59 | Source: Ambulatory Visit | Attending: Internal Medicine | Admitting: Internal Medicine

## 2014-11-13 DIAGNOSIS — Z1231 Encounter for screening mammogram for malignant neoplasm of breast: Secondary | ICD-10-CM | POA: Diagnosis present

## 2014-11-25 ENCOUNTER — Ambulatory Visit (INDEPENDENT_AMBULATORY_CARE_PROVIDER_SITE_OTHER): Payer: 59 | Admitting: Infectious Diseases

## 2014-11-25 ENCOUNTER — Encounter: Payer: Self-pay | Admitting: Infectious Diseases

## 2014-11-25 VITALS — BP 145/84 | HR 79 | Temp 97.5°F | Wt 241.0 lb

## 2014-11-25 DIAGNOSIS — R87612 Low grade squamous intraepithelial lesion on cytologic smear of cervix (LGSIL): Secondary | ICD-10-CM | POA: Diagnosis not present

## 2014-11-25 DIAGNOSIS — G4700 Insomnia, unspecified: Secondary | ICD-10-CM

## 2014-11-25 DIAGNOSIS — B20 Human immunodeficiency virus [HIV] disease: Secondary | ICD-10-CM | POA: Diagnosis not present

## 2014-11-25 DIAGNOSIS — L408 Other psoriasis: Secondary | ICD-10-CM | POA: Diagnosis not present

## 2014-11-25 DIAGNOSIS — I10 Essential (primary) hypertension: Secondary | ICD-10-CM

## 2014-11-25 DIAGNOSIS — E785 Hyperlipidemia, unspecified: Secondary | ICD-10-CM

## 2014-11-25 MED ORDER — CLOBETASOL PROPIONATE 0.05 % EX SOLN: 1.0000 "application " | Freq: Two times a day (BID) | CUTANEOUS | Status: DC

## 2014-11-25 MED ORDER — EFAVIRENZ-EMTRICITAB-TENOFOVIR 600-200-300 MG PO TABS: 1.0000 | ORAL_TABLET | Freq: Every day | ORAL | Status: DC

## 2014-11-25 MED ORDER — TEMAZEPAM 15 MG PO CAPS: 15.0000 mg | ORAL_CAPSULE | Freq: Every evening | ORAL | Status: DC | PRN

## 2014-11-25 NOTE — Assessment & Plan Note (Signed)
Borderline today. Asx.  Will f/u at next visit,  Also f/u with pcp

## 2014-11-25 NOTE — Assessment & Plan Note (Signed)
We spoke at length about updating her and her husbands ART (odefsy). She has concerns about taking with food.  Will defer til next year.  She has gotten flu shot.  She is doing very well.  Offered/refused condoms.  rtc 1 year.

## 2014-11-25 NOTE — Assessment & Plan Note (Signed)
encourage diet and exercise.  Will f/u with PCP

## 2014-11-25 NOTE — Assessment & Plan Note (Signed)
Will f/u with PCP, pcp directing her for q3 yr pap

## 2014-11-25 NOTE — Assessment & Plan Note (Signed)
Will refill her topical steriods

## 2014-11-25 NOTE — Progress Notes (Signed)
   Subjective:    Patient ID: Gail Edwards, female    DOB: 09-25-1955, 59 y.o.   MRN: [id number]  HPI  59 yo F with HIV+ since May 1991. Has been doing well since. On atripla. No problems with meds. Had PAP nl (10-01-13), PCP wants to do every 3 years. Mammo 11-13-14.  Has gotten IM at [facility], PCP.  CD4 755, HIV RNA und, Chol 212 (LDL 145) (11-01-14).  Has lost a little more wt. Feels great.    Review of Systems  Constitutional: Negative for appetite change and unexpected weight change.  Respiratory: Negative for shortness of breath.   Cardiovascular: Negative for chest pain and leg swelling.  Gastrointestinal: Negative for diarrhea and constipation.  Genitourinary: Negative for difficulty urinating and menstrual problem.  Neurological: Negative for headaches.       Objective:   Physical Exam  Constitutional: She appears well-developed and well-nourished.  HENT:  Mouth/Throat: No oropharyngeal exudate.  Eyes: EOM are normal. Pupils are equal, round, and reactive to light.  Neck: Neck supple.  Cardiovascular: Normal rate, regular rhythm and normal heart sounds.   Pulmonary/Chest: Effort normal and breath sounds normal.  Abdominal: Soft. Bowel sounds are normal. There is no tenderness.  Musculoskeletal: She exhibits no edema.  Lymphadenopathy:    She has no cervical adenopathy.          Assessment & Plan:

## 2014-12-03 ENCOUNTER — Other Ambulatory Visit: Payer: Self-pay | Admitting: Infectious Diseases

## 2014-12-03 DIAGNOSIS — G4700 Insomnia, unspecified: Secondary | ICD-10-CM

## 2014-12-03 MED ORDER — TEMAZEPAM 15 MG PO CAPS: 15.0000 mg | ORAL_CAPSULE | Freq: Every evening | ORAL | Status: DC | PRN

## 2015-09-23 ENCOUNTER — Other Ambulatory Visit: Payer: Self-pay | Admitting: Infectious Diseases

## 2015-09-23 DIAGNOSIS — Z1231 Encounter for screening mammogram for malignant neoplasm of breast: Secondary | ICD-10-CM

## 2015-10-10 ENCOUNTER — Other Ambulatory Visit: Payer: Self-pay | Admitting: Internal Medicine

## 2015-10-10 ENCOUNTER — Other Ambulatory Visit (HOSPITAL_COMMUNITY)
Admission: RE | Admit: 2015-10-10 | Discharge: 2015-10-10 | Disposition: A | Discharge status: Home / Self Care | Payer: 59 | Source: Ambulatory Visit | Attending: Internal Medicine | Admitting: Internal Medicine

## 2015-10-10 DIAGNOSIS — Z01419 Encounter for gynecological examination (general) (routine) without abnormal findings: Secondary | ICD-10-CM | POA: Insufficient documentation

## 2015-10-10 DIAGNOSIS — Z1151 Encounter for screening for human papillomavirus (HPV): Secondary | ICD-10-CM | POA: Diagnosis present

## 2015-10-13 LAB — CYTOLOGY - PAP

## 2015-10-28 ENCOUNTER — Telehealth: Payer: Self-pay | Admitting: *Deleted

## 2015-10-28 NOTE — Telephone Encounter (Signed)
Patient calling, requesting lab orders to be drawn at work Gail Edwards([employer]).  Asking for the lab slip to be mailed to her home address - confirmed. Seeing Dr. Ninetta LightsHatcher 10/11 [name], [name], RN

## 2015-11-14 ENCOUNTER — Ambulatory Visit
Admission: RE | Admit: 2015-11-14 | Discharge: 2015-11-14 | Disposition: A | Discharge status: Home / Self Care | Payer: 59 | Source: Ambulatory Visit | Attending: Infectious Diseases | Admitting: Infectious Diseases

## 2015-11-14 DIAGNOSIS — Z1231 Encounter for screening mammogram for malignant neoplasm of breast: Secondary | ICD-10-CM

## 2015-11-14 NOTE — Telephone Encounter (Signed)
Patient called to check if her lab request have been mailed to her as she works for [employer] and gets them there she usually has them written out and sent to her and brings the results with her to the visit. She has a visit 12/03/15. She advised she called earlier and spoke with Marcelino DusterMichelle and has not received them yet and was just checking. Advised her will check into it and give her a call as soon as I can or just drop them in the mail as soon as I verify what labs he wants for her to have drawn.

## 2015-11-14 NOTE — Telephone Encounter (Signed)
CBC, CMP, CD4, HIV RNA, lipids thanks

## 2015-12-03 ENCOUNTER — Other Ambulatory Visit: Payer: Self-pay | Admitting: *Deleted

## 2015-12-03 ENCOUNTER — Encounter: Payer: Self-pay | Admitting: Infectious Diseases

## 2015-12-03 ENCOUNTER — Ambulatory Visit (INDEPENDENT_AMBULATORY_CARE_PROVIDER_SITE_OTHER): Payer: 59 | Admitting: Infectious Diseases

## 2015-12-03 VITALS — BP 165/79 | HR 74 | Temp 98.1°F | Ht 66.0 in | Wt 227.0 lb

## 2015-12-03 DIAGNOSIS — B20 Human immunodeficiency virus [HIV] disease: Secondary | ICD-10-CM

## 2015-12-03 DIAGNOSIS — G4709 Other insomnia: Secondary | ICD-10-CM | POA: Diagnosis not present

## 2015-12-03 DIAGNOSIS — I10 Essential (primary) hypertension: Secondary | ICD-10-CM | POA: Diagnosis not present

## 2015-12-03 MED ORDER — CLOBETASOL PROPIONATE 0.05 % EX SOLN: 1.0000 "application " | Freq: Two times a day (BID) | CUTANEOUS | 12 refills | Status: DC

## 2015-12-03 MED ORDER — EMTRICITAB-RILPIVIR-TENOFOV AF 200-25-25 MG PO TABS: 1.0000 | ORAL_TABLET | Freq: Every day | ORAL | 3 refills | Status: DC

## 2015-12-03 MED ORDER — CLOBETASOL PROPIONATE 0.05 % EX OINT: 1.0000 "application " | TOPICAL_OINTMENT | Freq: Two times a day (BID) | CUTANEOUS | 12 refills | Status: DC

## 2015-12-03 MED ORDER — TEMAZEPAM 15 MG PO CAPS: 15.0000 mg | ORAL_CAPSULE | Freq: Every evening | ORAL | 1 refills | Status: DC | PRN

## 2015-12-03 NOTE — Assessment & Plan Note (Signed)
She is doing very well.  Will change her to odfesy.  vax up to date.  Check HIV RNA in 3 months with med change.  rtc 1 year.

## 2015-12-03 NOTE — Assessment & Plan Note (Signed)
Has PCP.  working on wt loss.

## 2015-12-03 NOTE — Assessment & Plan Note (Signed)
restoril refilled.

## 2015-12-03 NOTE — Progress Notes (Signed)
   Subjective:    Patient ID: Gail [facility], female    DOB: 03/17/1955, 60 y.o.   MRN: [id number]  HPI 60 yo F with HIV+ since May 1991. Has been doing well since.  Has gotten IM at [facility], PCP.  CD4 755 --> 637, HIV RNA und,  (11-25-15).   Has had health maintenance updates- Td 10/10/15, flu 11/21/15, PNVx 8/18, Mammo 9/22, Pap 8/18, Bone density 9/28.   Has been doing well.  Needs med refills.  Wants to change to odefsy.   Review of Systems  Constitutional: Negative for appetite change and unexpected weight change.  Respiratory: Negative for shortness of breath.   Gastrointestinal: Negative for constipation and diarrhea.  Genitourinary: Negative for difficulty urinating.       Objective:   Physical Exam  Constitutional: She appears well-developed and well-nourished.  HENT:  Mouth/Throat: No oropharyngeal exudate.  Eyes: EOM are normal. Pupils are equal, round, and reactive to light.  Neck: Neck supple.  Cardiovascular: Normal rate, regular rhythm and normal heart sounds.   Pulmonary/Chest: Effort normal and breath sounds normal.  Abdominal: Soft. Bowel sounds are normal. There is no tenderness. There is no rebound.  Musculoskeletal: She exhibits no edema.  Lymphadenopathy:    She has no cervical adenopathy.      Assessment & Plan:

## 2015-12-11 ENCOUNTER — Telehealth: Payer: Self-pay | Admitting: *Deleted

## 2015-12-11 ENCOUNTER — Other Ambulatory Visit: Payer: Self-pay | Admitting: *Deleted

## 2015-12-11 DIAGNOSIS — G4709 Other insomnia: Secondary | ICD-10-CM

## 2015-12-11 MED ORDER — TEMAZEPAM 15 MG PO CAPS: 15.0000 mg | ORAL_CAPSULE | Freq: Every evening | ORAL | 1 refills | Status: DC | PRN

## 2015-12-11 NOTE — Telephone Encounter (Signed)
Dr Ninetta LightsHatcher approved refill for Restoril.

## 2015-12-15 ENCOUNTER — Other Ambulatory Visit: Payer: Self-pay | Admitting: *Deleted

## 2015-12-15 DIAGNOSIS — G4709 Other insomnia: Secondary | ICD-10-CM

## 2015-12-15 MED ORDER — TEMAZEPAM 15 MG PO CAPS: 15.0000 mg | ORAL_CAPSULE | Freq: Every evening | ORAL | 2 refills | Status: DC | PRN

## 2015-12-15 MED ORDER — TEMAZEPAM 15 MG PO CAPS: 15.0000 mg | ORAL_CAPSULE | Freq: Every evening | ORAL | 1 refills | Status: DC | PRN

## 2015-12-16 ENCOUNTER — Encounter: Payer: Self-pay | Admitting: Internal Medicine

## 2016-02-26 ENCOUNTER — Encounter: Payer: Self-pay | Admitting: Infectious Diseases

## 2016-02-26 DIAGNOSIS — Z7689 Persons encountering health services in other specified circumstances: Secondary | ICD-10-CM | POA: Diagnosis not present

## 2016-09-14 ENCOUNTER — Encounter: Payer: Self-pay | Admitting: Infectious Diseases

## 2016-09-29 ENCOUNTER — Other Ambulatory Visit: Payer: Self-pay | Admitting: Infectious Diseases

## 2016-09-29 DIAGNOSIS — Z1231 Encounter for screening mammogram for malignant neoplasm of breast: Secondary | ICD-10-CM

## 2016-10-07 ENCOUNTER — Other Ambulatory Visit: Payer: Self-pay | Admitting: Infectious Diseases

## 2016-10-07 DIAGNOSIS — B20 Human immunodeficiency virus [HIV] disease: Secondary | ICD-10-CM

## 2016-10-13 ENCOUNTER — Other Ambulatory Visit: Payer: Self-pay | Admitting: Infectious Diseases

## 2016-10-13 DIAGNOSIS — B20 Human immunodeficiency virus [HIV] disease: Secondary | ICD-10-CM

## 2016-10-26 ENCOUNTER — Encounter: Payer: Self-pay | Admitting: Infectious Diseases

## 2016-10-27 ENCOUNTER — Encounter: Payer: Self-pay | Admitting: *Deleted

## 2016-11-09 ENCOUNTER — Encounter: Payer: Self-pay | Admitting: Infectious Diseases

## 2016-11-09 DIAGNOSIS — E559 Vitamin D deficiency, unspecified: Secondary | ICD-10-CM | POA: Diagnosis not present

## 2016-11-09 DIAGNOSIS — E785 Hyperlipidemia, unspecified: Secondary | ICD-10-CM | POA: Diagnosis not present

## 2016-11-09 DIAGNOSIS — I10 Essential (primary) hypertension: Secondary | ICD-10-CM | POA: Diagnosis not present

## 2016-11-16 ENCOUNTER — Ambulatory Visit
Admission: RE | Admit: 2016-11-16 | Discharge: 2016-11-16 | Disposition: A | Discharge status: Home / Self Care | Payer: 59 | Source: Ambulatory Visit | Attending: Infectious Diseases | Admitting: Infectious Diseases

## 2016-11-16 DIAGNOSIS — Z1231 Encounter for screening mammogram for malignant neoplasm of breast: Secondary | ICD-10-CM

## 2016-11-16 DIAGNOSIS — Z23 Encounter for immunization: Secondary | ICD-10-CM | POA: Diagnosis not present

## 2016-11-16 DIAGNOSIS — Z0000 Encounter for general adult medical examination without abnormal findings: Secondary | ICD-10-CM | POA: Diagnosis not present

## 2016-11-16 IMAGING — MG DIGITAL SCREENING BILATERAL MAMMOGRAM WITH CAD
7 series · 7 of 7 positions shown · non-contrast
Comparison: Previous exam(s).

CLINICAL DATA: Screening.

EXAM:
DIGITAL SCREENING BILATERAL MAMMOGRAM WITH CAD

[R MLO (1 of 3)]
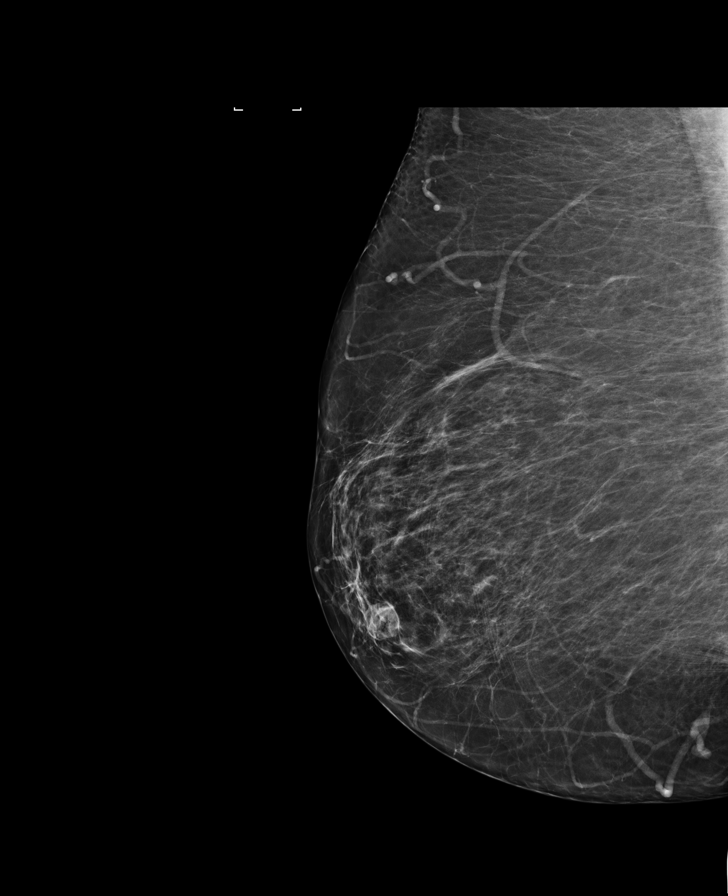

[L MLO (1 of 2)]
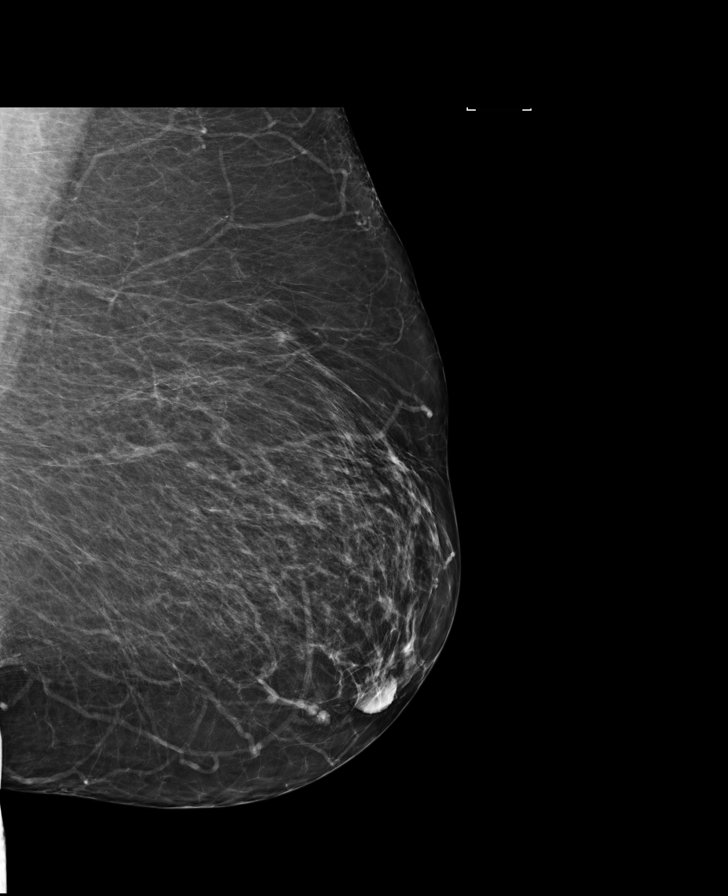

[L MLO (2 of 2)]
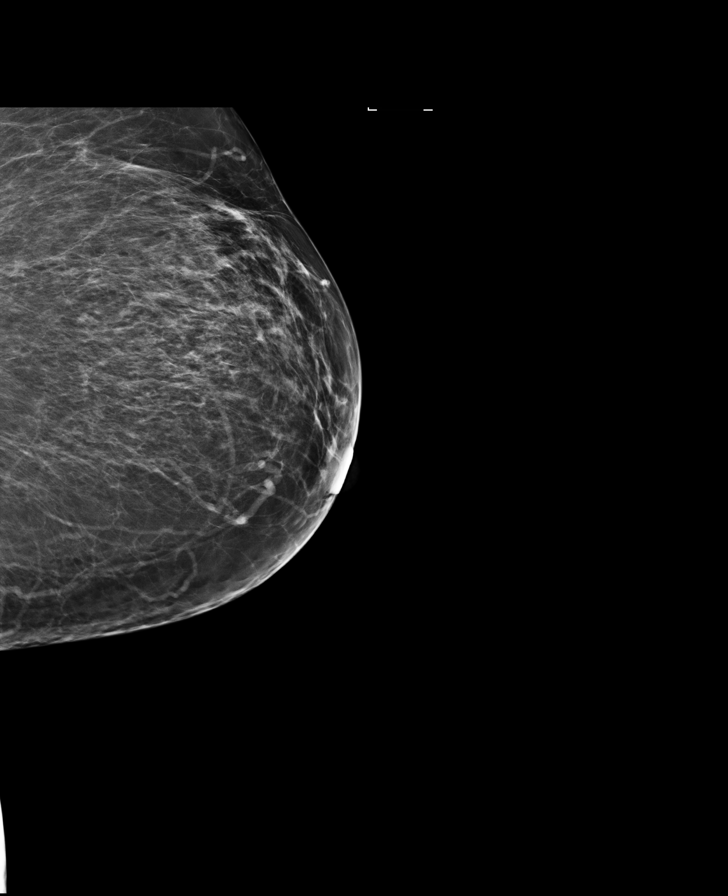

[L CC]
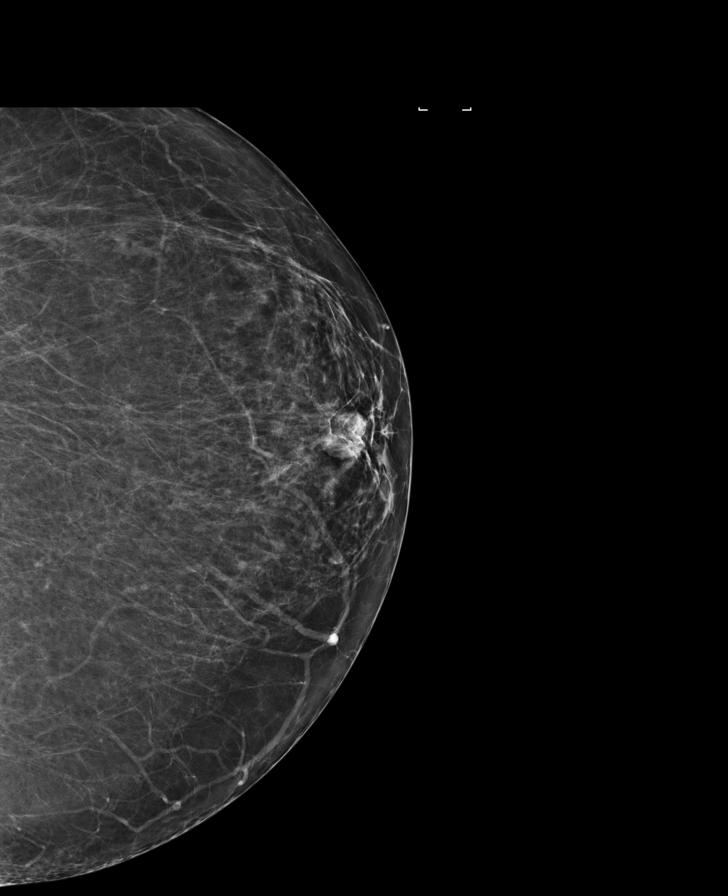

[R MLO (2 of 3)]
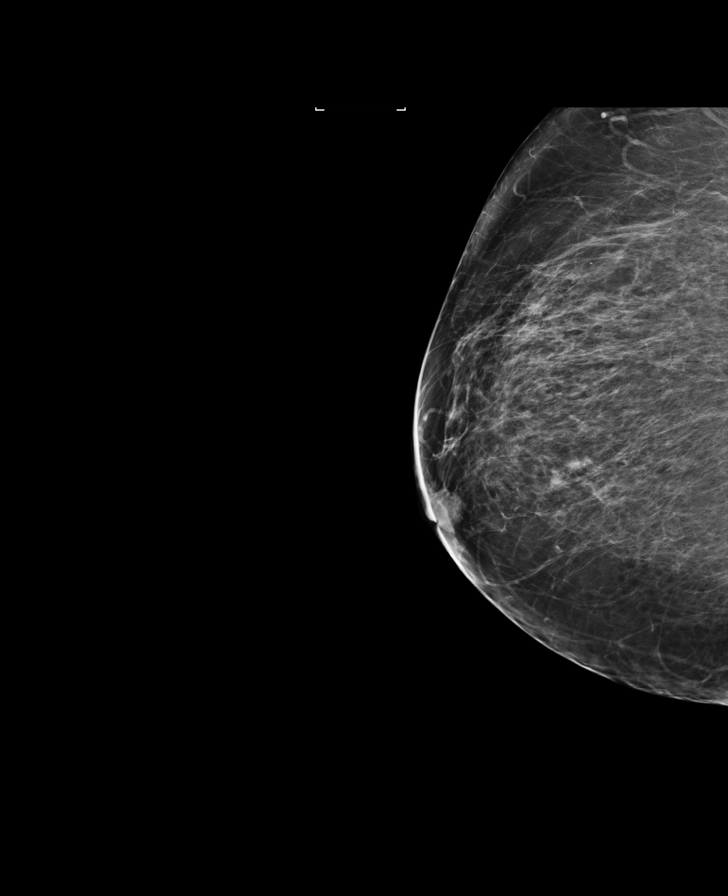

[R MLO (3 of 3)]
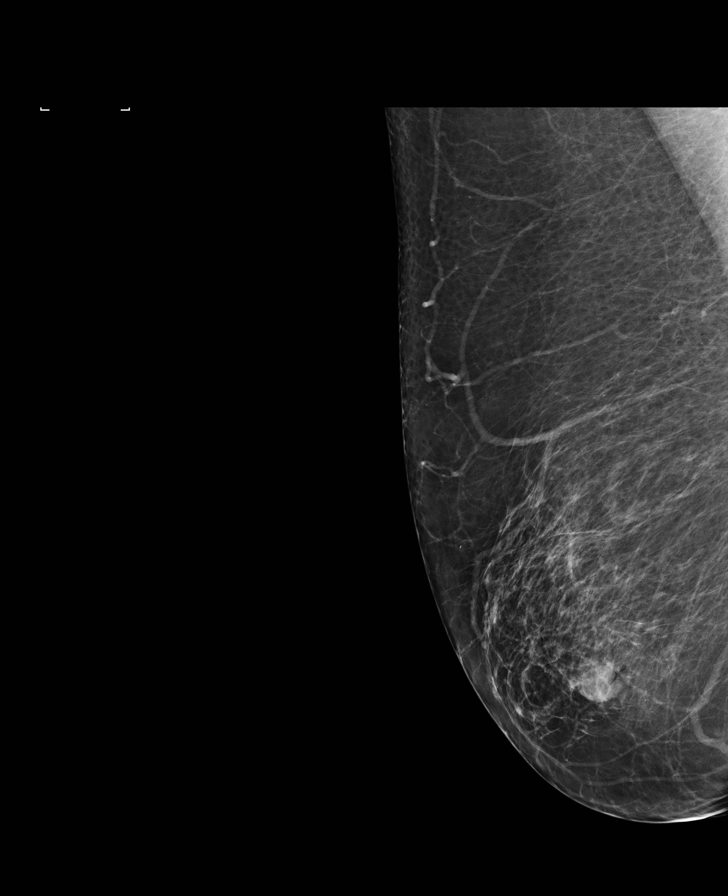

[R CC]
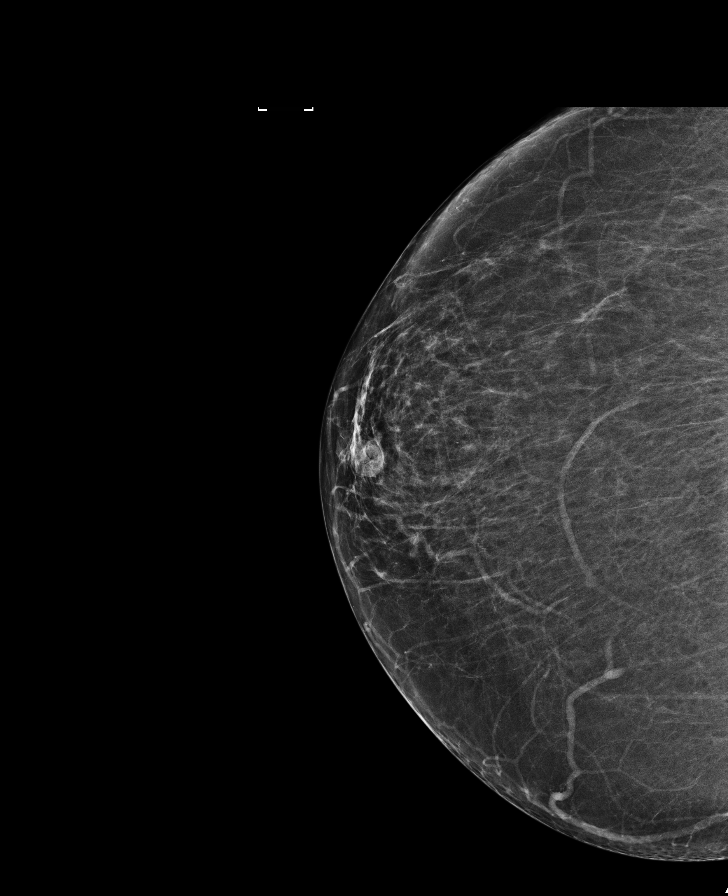

[7 of 7 positions shown; findings below may reference images not displayed]

ACR Breast Density Category b: There are scattered areas of
fibroglandular density.
FINDINGS: There are no findings suspicious for malignancy. Images were
processed with CAD.
IMPRESSION: No mammographic evidence of malignancy. A result letter of this
screening mammogram will be mailed directly to the patient.

RECOMMENDATION:
Screening mammogram in one year. (Code:[US])

BI-RADS CATEGORY  1: Negative.

## 2016-11-22 ENCOUNTER — Ambulatory Visit: Payer: 59 | Admitting: Infectious Diseases

## 2016-12-03 DIAGNOSIS — S42294A Other nondisplaced fracture of upper end of right humerus, initial encounter for closed fracture: Secondary | ICD-10-CM | POA: Diagnosis not present

## 2016-12-06 ENCOUNTER — Other Ambulatory Visit: Payer: Self-pay | Admitting: Orthopedic Surgery

## 2016-12-06 DIAGNOSIS — G8929 Other chronic pain: Secondary | ICD-10-CM

## 2016-12-06 DIAGNOSIS — M25511 Pain in right shoulder: Principal | ICD-10-CM

## 2016-12-06 DIAGNOSIS — S42294D Other nondisplaced fracture of upper end of right humerus, subsequent encounter for fracture with routine healing: Secondary | ICD-10-CM | POA: Diagnosis not present

## 2016-12-07 ENCOUNTER — Ambulatory Visit
Admission: RE | Admit: 2016-12-07 | Discharge: 2016-12-07 | Disposition: A | Discharge status: Home / Self Care | Payer: 59 | Source: Ambulatory Visit | Attending: Orthopedic Surgery | Admitting: Orthopedic Surgery

## 2016-12-07 ENCOUNTER — Telehealth: Payer: Self-pay | Admitting: *Deleted

## 2016-12-07 DIAGNOSIS — S42211A Unspecified displaced fracture of surgical neck of right humerus, initial encounter for closed fracture: Secondary | ICD-10-CM | POA: Diagnosis not present

## 2016-12-07 DIAGNOSIS — B20 Human immunodeficiency virus [HIV] disease: Secondary | ICD-10-CM

## 2016-12-07 DIAGNOSIS — G8929 Other chronic pain: Secondary | ICD-10-CM

## 2016-12-07 DIAGNOSIS — M25511 Pain in right shoulder: Principal | ICD-10-CM

## 2016-12-07 DIAGNOSIS — G4709 Other insomnia: Secondary | ICD-10-CM

## 2016-12-07 IMAGING — CT CT SHOULDER*R* W/O CM
2 series · 10 of 14 positions shown, 12 images · non-contrast
Comparison: None.

CLINICAL DATA: Proximal right humerus fracture due to a fall
[DATE]. Initial encounter.

EXAM:
CT OF THE UPPER RIGHT EXTREMITY WITHOUT CONTRAST
TECHNIQUE: Multidetector CT imaging of the upper right extremity was performed
according to the standard protocol.

[Series 2: soft tissue · axial · 0.49mm/px · z∈[-239,-75]mm · 5 of 124 slices shown]
[im 21/124  soft-tissue]
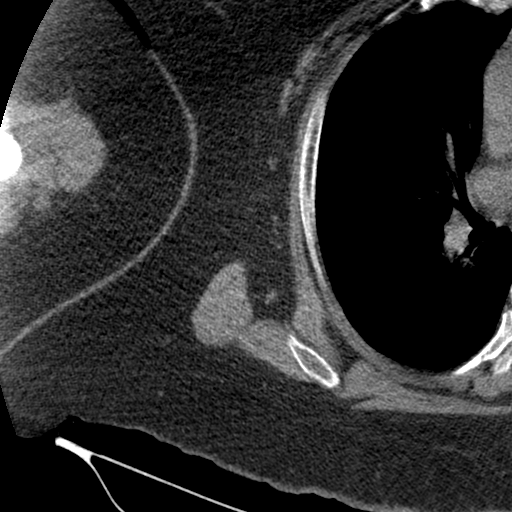
[im 42/124  soft-tissue]
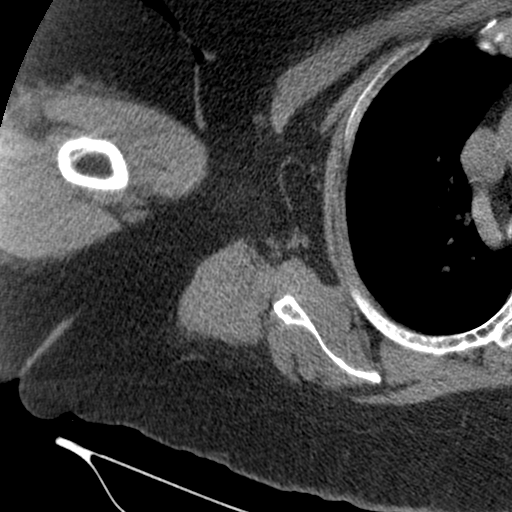
[im 62/124  soft-tissue]
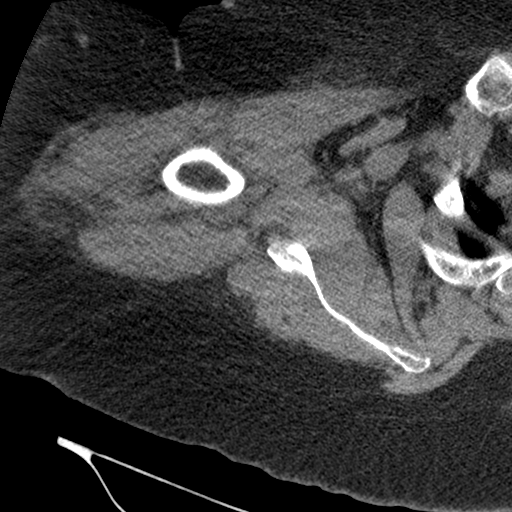
[im 83/124  soft-tissue]
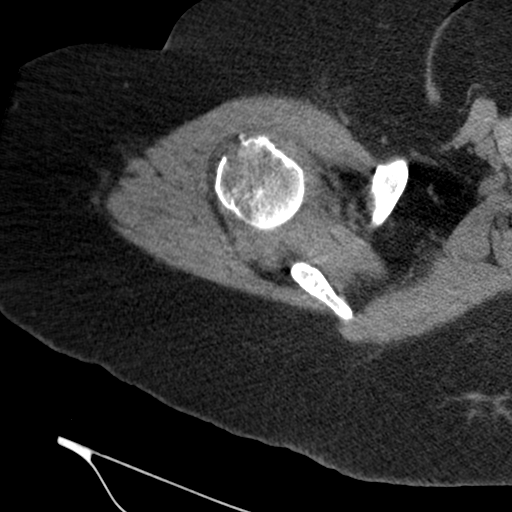
[im 103/124  soft-tissue]
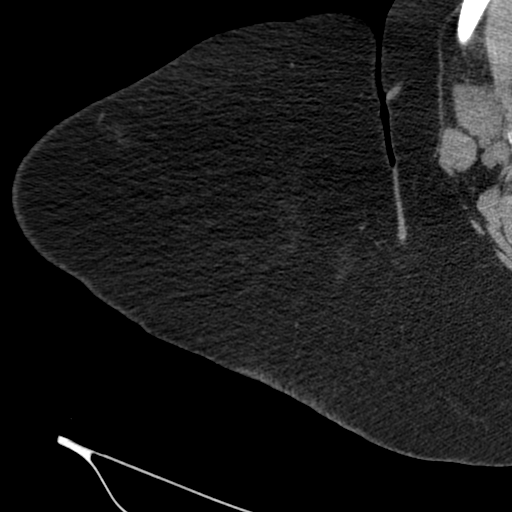

[Series 3: bone · axial · 0.49mm/px · z∈[-235,-75]mm · 5 of 122 slices shown, 7 images]
[im 21/122  soft-tissue]
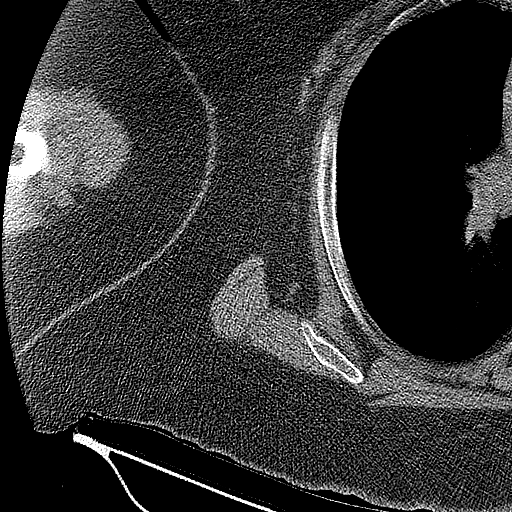
[im 21/122  bone]
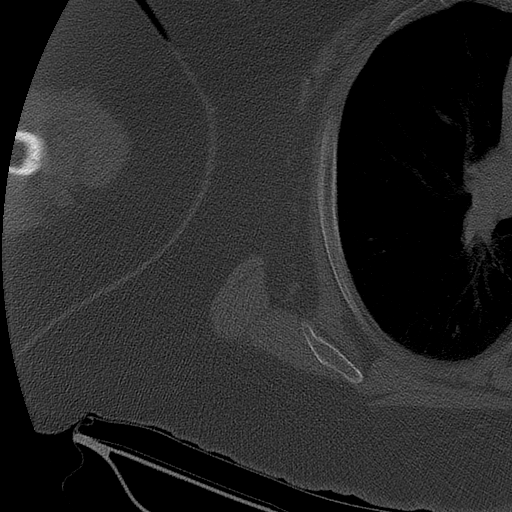
[im 41/122  bone]
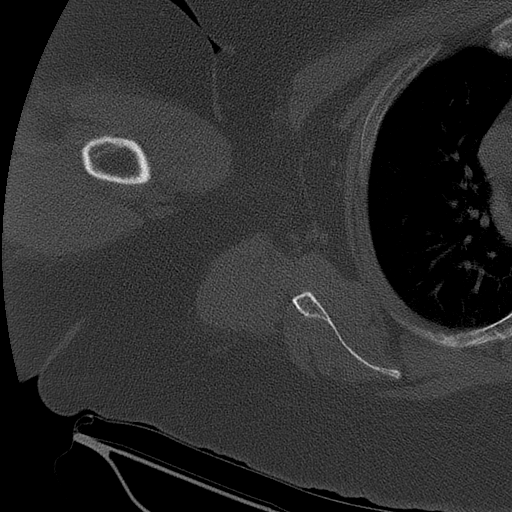
[im 61/122  bone]
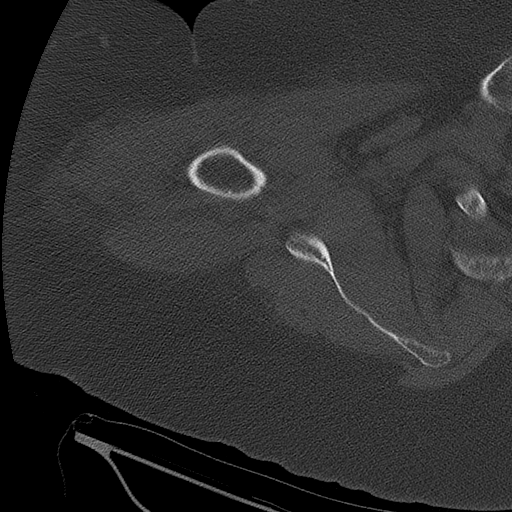
[im 81/122  bone]
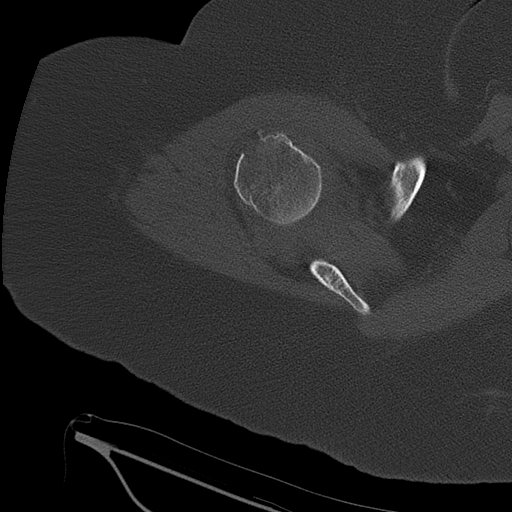
[im 101/122  soft-tissue]
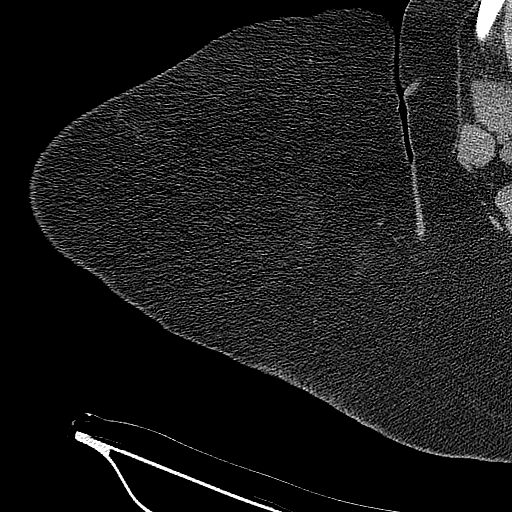
[im 101/122  bone]
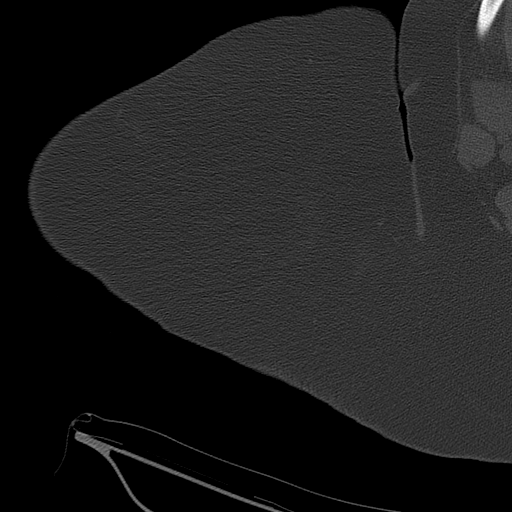

[10 of 14 positions shown; findings below may reference images not displayed]

FINDINGS: Bones/Joint/Cartilage

The patient has a transverse, mildly comminuted surgical neck
fracture of the right humerus. The distal fragment shows slight
medial displacement and minimal impaction. The fracture extends into
the greater tuberosity which is a separate fragment. The greater
tuberosity demonstrates superior displacement of approximately
cm and slight lateral displacement. The lesser tuberosity is spared.
No other fracture is identified. The acromioclavicular joint is
intact with mild to moderate degenerative change is seen.

Ligaments

Suboptimally assessed by CT.

Muscles and Tendons

Musculature about the shoulder is preserved. No rotator cuff tear is
identified.

Soft tissues

Imaged lung parenchyma is clear.
IMPRESSION: Minimally impacted and displaced transverse surgical neck fracture
of the right humerus extends into the greater tuberosity which is a
separate fragment with mild displacement as described above.

Intact acromioclavicular joint with mild to moderate degenerative
change seen.

The rotator cuff appears intact.

## 2016-12-07 MED ORDER — EMTRICITAB-RILPIVIR-TENOFOV AF 200-25-25 MG PO TABS: 1.0000 | ORAL_TABLET | Freq: Every day | ORAL | 5 refills | Status: DC

## 2016-12-07 MED ORDER — TEMAZEPAM 15 MG PO CAPS: 15.0000 mg | ORAL_CAPSULE | Freq: Every evening | ORAL | 0 refills | Status: DC | PRN

## 2016-12-07 NOTE — Telephone Encounter (Signed)
Rescheduled patient's appointment due to broken shoulder.  Patient needs refills for Odefsey and temazepam.

## 2016-12-08 ENCOUNTER — Ambulatory Visit: Payer: 59 | Admitting: Infectious Diseases

## 2016-12-10 ENCOUNTER — Encounter (HOSPITAL_BASED_OUTPATIENT_CLINIC_OR_DEPARTMENT_OTHER): Payer: Self-pay | Admitting: *Deleted

## 2016-12-10 NOTE — H&P (Signed)
[facility] [address]   [address] [place], [place] [id number] [phone]  A Division of [facility]  RE: Gail [facility], Gail Edwards                                  [id number]         DOB: 08-Feb-1956 INITIAL EVALUATION 12/06/2016  Reason for visit:  Right shoulder injury.   HPI: She is 61 years old.  She suffered a fall on 12/03/2016.  She was seen in urgent care and referred to me.  She has a proximal humerus fracture with significant displacement.  She has a history of pre-diabetes, obesity and she is HIV positive.  Pain has been well controlled.  She has been in a sling.    OBJECTIVE: The patient is a well appearing female, in no apparent distress.  Pain with range of motion.  She is neurovascularly intact.    IMAGES: X-rays show a four-part proximal humerus fracture with significant valgus impaction of the humeral head.    ASSESSMENT/PLAN:  We had a long talk about her options.  I think she will have poor function if we allow it to heal where it is.  I think likely she will be amenable to open reduction and internal fixation with this.  I would like to obtain a CAT scan to confirm that, and then we will set her up for that surgery versus arthroplasty if needed.  I discussed the possibility of intraoperative conversion to arthroplasty.      Jewel Baizeimothy D.  Eulah PontMurphy, M.D.  Electronically verified by Jewel Baizeimothy D. Eulah PontMurphy, M.D. TDM:pmw Cc:  Lorenda Ishiharaupashree Varadarajan MD  fax [phone]  D 12/08/16 T 12/09/16

## 2016-12-13 ENCOUNTER — Encounter (HOSPITAL_BASED_OUTPATIENT_CLINIC_OR_DEPARTMENT_OTHER)
Admission: RE | Admit: 2016-12-13 | Discharge: 2016-12-13 | Disposition: A | Discharge status: Home / Self Care | Payer: 59 | Source: Ambulatory Visit | Attending: Orthopedic Surgery | Admitting: Orthopedic Surgery

## 2016-12-13 DIAGNOSIS — Z7984 Long term (current) use of oral hypoglycemic drugs: Secondary | ICD-10-CM | POA: Insufficient documentation

## 2016-12-13 DIAGNOSIS — E119 Type 2 diabetes mellitus without complications: Secondary | ICD-10-CM | POA: Insufficient documentation

## 2016-12-13 DIAGNOSIS — I10 Essential (primary) hypertension: Secondary | ICD-10-CM | POA: Diagnosis not present

## 2016-12-13 DIAGNOSIS — Z01812 Encounter for preprocedural laboratory examination: Secondary | ICD-10-CM | POA: Diagnosis not present

## 2016-12-13 LAB — BASIC METABOLIC PANEL
Anion gap: 7 (ref 5–15)
BUN: 18 mg/dL (ref 6–20)
CO2: 28 mmol/L (ref 22–32)
Calcium: 9 mg/dL (ref 8.9–10.3)
Chloride: 102 mmol/L (ref 101–111)
Creatinine, Ser: 0.84 mg/dL (ref 0.44–1.00)
GFR calc Af Amer: >60 mL/min (ref >60)
GFR calc non Af Amer: >60 mL/min (ref >60)
Glucose, Bld: 149 mg/dL — ABNORMAL HIGH (ref 65–99)
Potassium: 4.3 mmol/L (ref 3.5–5.1)
Sodium: 137 mmol/L (ref 135–145)

## 2016-12-13 NOTE — Progress Notes (Signed)
PT in for PAT appt. BMET drawn, Dr Sherron AlesK. Jackson in to review pt's anatomy and airway. [facility] 10[facility] at [facility].

## 2016-12-17 ENCOUNTER — Encounter (HOSPITAL_BASED_OUTPATIENT_CLINIC_OR_DEPARTMENT_OTHER): Payer: Self-pay | Admitting: Emergency Medicine

## 2016-12-17 ENCOUNTER — Ambulatory Visit (HOSPITAL_BASED_OUTPATIENT_CLINIC_OR_DEPARTMENT_OTHER)
Admission: RE | Admit: 2016-12-17 | Discharge: 2016-12-17 | Disposition: A | Discharge status: Home / Self Care | Payer: 59 | Source: Ambulatory Visit | Attending: Orthopedic Surgery | Admitting: Orthopedic Surgery

## 2016-12-17 ENCOUNTER — Ambulatory Visit (HOSPITAL_COMMUNITY): Payer: 59

## 2016-12-17 ENCOUNTER — Ambulatory Visit (HOSPITAL_BASED_OUTPATIENT_CLINIC_OR_DEPARTMENT_OTHER): Payer: 59 | Admitting: Certified Registered"

## 2016-12-17 ENCOUNTER — Encounter (HOSPITAL_BASED_OUTPATIENT_CLINIC_OR_DEPARTMENT_OTHER)
Admission: RE | Disposition: A | Discharge status: Home / Self Care | Payer: Self-pay | Source: Ambulatory Visit | Attending: Orthopedic Surgery

## 2016-12-17 DIAGNOSIS — Z6841 Body Mass Index (BMI) 40.0 and over, adult: Secondary | ICD-10-CM | POA: Insufficient documentation

## 2016-12-17 DIAGNOSIS — W19XXXA Unspecified fall, initial encounter: Secondary | ICD-10-CM | POA: Diagnosis not present

## 2016-12-17 DIAGNOSIS — S42291A Other displaced fracture of upper end of right humerus, initial encounter for closed fracture: Secondary | ICD-10-CM

## 2016-12-17 DIAGNOSIS — E119 Type 2 diabetes mellitus without complications: Secondary | ICD-10-CM | POA: Insufficient documentation

## 2016-12-17 DIAGNOSIS — I10 Essential (primary) hypertension: Secondary | ICD-10-CM | POA: Insufficient documentation

## 2016-12-17 DIAGNOSIS — S42201A Unspecified fracture of upper end of right humerus, initial encounter for closed fracture: Secondary | ICD-10-CM | POA: Insufficient documentation

## 2016-12-17 DIAGNOSIS — B20 Human immunodeficiency virus [HIV] disease: Secondary | ICD-10-CM | POA: Diagnosis not present

## 2016-12-17 DIAGNOSIS — Z7984 Long term (current) use of oral hypoglycemic drugs: Secondary | ICD-10-CM | POA: Insufficient documentation

## 2016-12-17 DIAGNOSIS — Z419 Encounter for procedure for purposes other than remedying health state, unspecified: Secondary | ICD-10-CM

## 2016-12-17 DIAGNOSIS — G8918 Other acute postprocedural pain: Secondary | ICD-10-CM | POA: Diagnosis not present

## 2016-12-17 HISTORY — DX: Prediabetes: R73.03

## 2016-12-17 HISTORY — PX: FRACTURE SURGERY: SHX138

## 2016-12-17 HISTORY — PX: ORIF HUMERUS FRACTURE: SHX2126

## 2016-12-17 LAB — GLUCOSE, CAPILLARY
Glucose-Capillary: 97 mg/dL (ref 65–99)
Glucose-Capillary: 123 mg/dL — ABNORMAL HIGH (ref 65–99)

## 2016-12-17 IMAGING — RF DG C-ARM 61-120 MIN
1 series · 2 of 2 positions shown · non-contrast
Comparison: Right shoulder CT dated [DATE].

CLINICAL DATA: Right humerus fracture fixation.

EXAM:
RIGHT HUMERUS - 2+ VIEW; DG C-ARM 61-120 MIN

[Series 1: run · 2 of 2 slices shown]
[im 1/2]
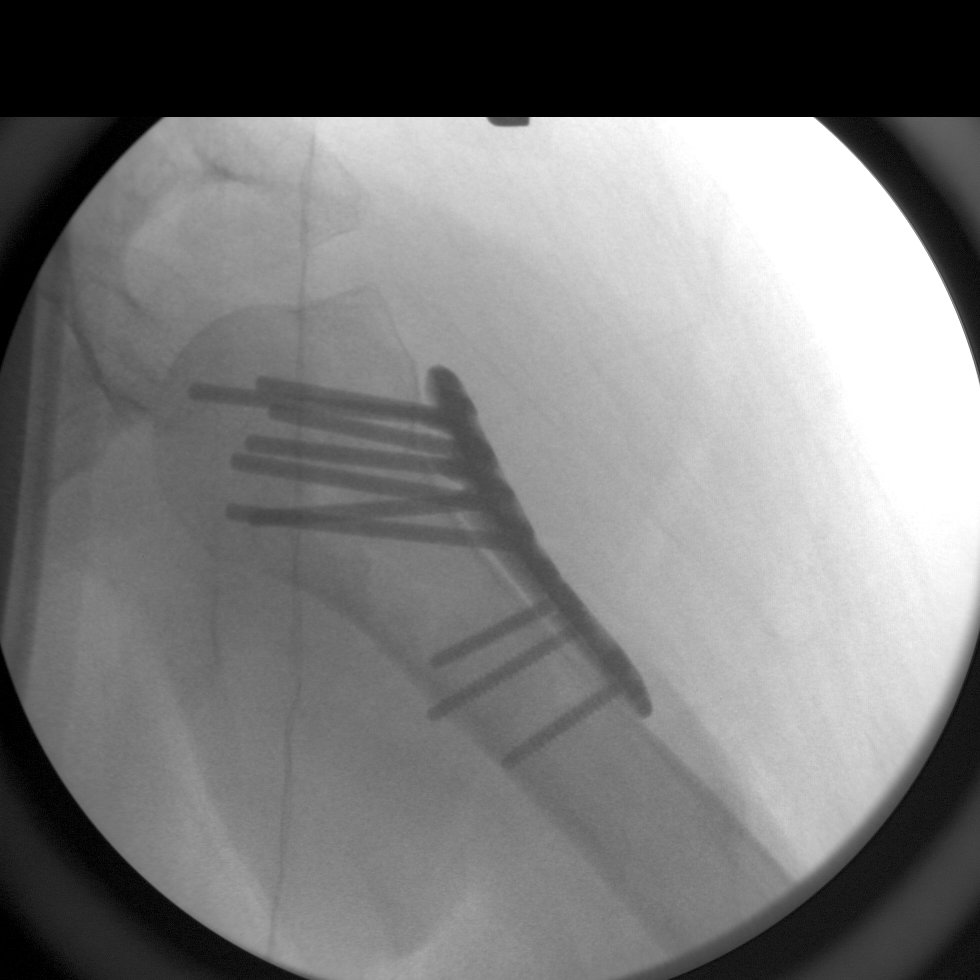
[im 2/2]
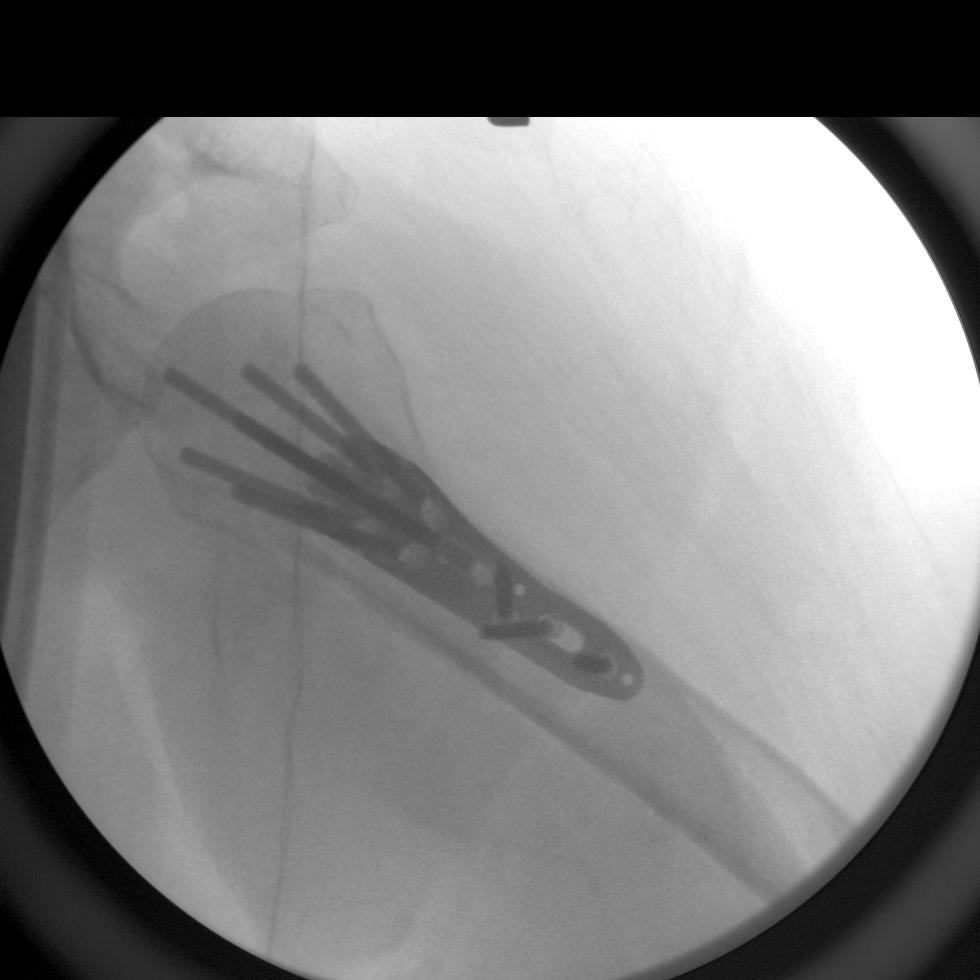

[2 of 2 positions shown; findings below may reference images not displayed]

FINDINGS: Two C-arm views of the proximal right humerus demonstrate screw and
plate fixation of the previously demonstrated comminuted surgical
neck and greater tuberosity fracture. Anatomic position and
alignment.
IMPRESSION: Anatomic position and alignment following hardware fixation of the
previously demonstrated proximal humerus fracture.

## 2016-12-17 SURGERY — OPEN REDUCTION INTERNAL FIXATION (ORIF) PROXIMAL HUMERUS FRACTURE
Anesthesia: General | Site: Arm Upper | Laterality: Right

## 2016-12-17 MED ORDER — DEXTROSE 5 % IV SOLN
3000.0000 mg | Freq: Once | INTRAVENOUS | Status: AC
  Administered 2016-12-17: 3000 mg via INTRAVENOUS

## 2016-12-17 MED ORDER — CEFAZOLIN SODIUM-DEXTROSE 2-4 GM/100ML-% IV SOLN: 2.0000 g | INTRAVENOUS | Status: DC

## 2016-12-17 MED ORDER — SUGAMMADEX SODIUM 200 MG/2ML IV SOLN
INTRAVENOUS | Status: DC | PRN
  Administered 2016-12-17: 200 mg via INTRAVENOUS

## 2016-12-17 MED ORDER — ACETAMINOPHEN 500 MG PO TABS
ORAL_TABLET | ORAL | Status: AC
  Filled 2016-12-17: qty 2

## 2016-12-17 MED ORDER — OXYCODONE-ACETAMINOPHEN 5-325 MG PO TABS: 1.0000 | ORAL_TABLET | ORAL | 0 refills | Status: DC | PRN

## 2016-12-17 MED ORDER — DEXAMETHASONE SODIUM PHOSPHATE 10 MG/ML IJ SOLN
INTRAMUSCULAR | Status: AC
  Filled 2016-12-17: qty 1

## 2016-12-17 MED ORDER — LACTATED RINGERS IV SOLN
INTRAVENOUS | Status: DC
  Administered 2016-12-17 (×2): via INTRAVENOUS

## 2016-12-17 MED ORDER — MIDAZOLAM HCL 2 MG/2ML IJ SOLN
1.0000 mg | INTRAMUSCULAR | Status: DC | PRN
  Administered 2016-12-17: 2 mg via INTRAVENOUS

## 2016-12-17 MED ORDER — CHLORHEXIDINE GLUCONATE 4 % EX LIQD: 60.0000 mL | Freq: Once | CUTANEOUS | Status: DC

## 2016-12-17 MED ORDER — SCOPOLAMINE 1 MG/3DAYS TD PT72: 1.0000 | MEDICATED_PATCH | Freq: Once | TRANSDERMAL | Status: DC | PRN

## 2016-12-17 MED ORDER — GABAPENTIN 300 MG PO CAPS
300.0000 mg | ORAL_CAPSULE | Freq: Once | ORAL | Status: AC
  Administered 2016-12-17: 300 mg via ORAL

## 2016-12-17 MED ORDER — PROPOFOL 10 MG/ML IV BOLUS
INTRAVENOUS | Status: DC | PRN
  Administered 2016-12-17: 200 mg via INTRAVENOUS

## 2016-12-17 MED ORDER — DOCUSATE SODIUM 100 MG PO CAPS: 100.0000 mg | ORAL_CAPSULE | Freq: Two times a day (BID) | ORAL | 0 refills | Status: DC

## 2016-12-17 MED ORDER — PROPOFOL 10 MG/ML IV BOLUS
INTRAVENOUS | Status: AC
Start: 2016-12-17 — End: ?
  Filled 2016-12-17: qty 20

## 2016-12-17 MED ORDER — MEPERIDINE HCL 25 MG/ML IJ SOLN: 6.2500 mg | INTRAMUSCULAR | Status: DC | PRN

## 2016-12-17 MED ORDER — FENTANYL CITRATE (PF) 100 MCG/2ML IJ SOLN
50.0000 ug | INTRAMUSCULAR | Status: DC | PRN
  Administered 2016-12-17: 100 ug via INTRAVENOUS

## 2016-12-17 MED ORDER — ACETAMINOPHEN 500 MG PO TABS
1000.0000 mg | ORAL_TABLET | Freq: Once | ORAL | Status: AC
  Administered 2016-12-17: 1000 mg via ORAL

## 2016-12-17 MED ORDER — DEXAMETHASONE SODIUM PHOSPHATE 4 MG/ML IJ SOLN
INTRAMUSCULAR | Status: DC | PRN
  Administered 2016-12-17: 10 mg via INTRAVENOUS

## 2016-12-17 MED ORDER — ONDANSETRON HCL 4 MG/2ML IJ SOLN
INTRAMUSCULAR | Status: DC | PRN
  Administered 2016-12-17: 4 mg via INTRAVENOUS

## 2016-12-17 MED ORDER — PROMETHAZINE HCL 25 MG/ML IJ SOLN: 6.2500 mg | INTRAMUSCULAR | Status: DC | PRN

## 2016-12-17 MED ORDER — ONDANSETRON HCL 4 MG PO TABS: 4.0000 mg | ORAL_TABLET | Freq: Three times a day (TID) | ORAL | 0 refills | Status: DC | PRN

## 2016-12-17 MED ORDER — BACLOFEN 10 MG PO TABS: 10.0000 mg | ORAL_TABLET | Freq: Three times a day (TID) | ORAL | 0 refills | Status: DC | PRN

## 2016-12-17 MED ORDER — GABAPENTIN 300 MG PO CAPS
ORAL_CAPSULE | ORAL | Status: AC
  Filled 2016-12-17: qty 1

## 2016-12-17 MED ORDER — HYDROMORPHONE HCL 1 MG/ML IJ SOLN: 0.2500 mg | INTRAMUSCULAR | Status: DC | PRN

## 2016-12-17 MED ORDER — LIDOCAINE 2% (20 MG/ML) 5 ML SYRINGE
INTRAMUSCULAR | Status: DC | PRN
  Administered 2016-12-17: 80 mg via INTRAVENOUS

## 2016-12-17 MED ORDER — MIDAZOLAM HCL 2 MG/2ML IJ SOLN
INTRAMUSCULAR | Status: AC
  Filled 2016-12-17: qty 2

## 2016-12-17 MED ORDER — ROPIVACAINE HCL 5 MG/ML IJ SOLN
INTRAMUSCULAR | Status: DC | PRN
  Administered 2016-12-17: 30 mL via PERINEURAL

## 2016-12-17 MED ORDER — CEFAZOLIN SODIUM-DEXTROSE 1-4 GM/50ML-% IV SOLN
INTRAVENOUS | Status: AC
  Filled 2016-12-17: qty 50

## 2016-12-17 MED ORDER — CEFAZOLIN SODIUM-DEXTROSE 2-4 GM/100ML-% IV SOLN
INTRAVENOUS | Status: AC
  Filled 2016-12-17: qty 100

## 2016-12-17 MED ORDER — OXYCODONE HCL 5 MG PO TABS: 5.0000 mg | ORAL_TABLET | Freq: Once | ORAL | Status: DC | PRN

## 2016-12-17 MED ORDER — EPHEDRINE SULFATE 50 MG/ML IJ SOLN
INTRAMUSCULAR | Status: DC | PRN
  Administered 2016-12-17 (×2): 10 mg via INTRAVENOUS

## 2016-12-17 MED ORDER — ONDANSETRON HCL 4 MG/2ML IJ SOLN
INTRAMUSCULAR | Status: AC
Start: 2016-12-17 — End: ?
  Filled 2016-12-17: qty 2

## 2016-12-17 MED ORDER — ROCURONIUM BROMIDE 100 MG/10ML IV SOLN
INTRAVENOUS | Status: DC | PRN
  Administered 2016-12-17: 40 mg via INTRAVENOUS

## 2016-12-17 MED ORDER — FENTANYL CITRATE (PF) 100 MCG/2ML IJ SOLN
INTRAMUSCULAR | Status: AC
  Filled 2016-12-17: qty 2

## 2016-12-17 MED ORDER — SUCCINYLCHOLINE CHLORIDE 20 MG/ML IJ SOLN
INTRAMUSCULAR | Status: DC | PRN
  Administered 2016-12-17: 120 mg via INTRAVENOUS

## 2016-12-17 MED ORDER — LACTATED RINGERS IV SOLN: INTRAVENOUS | Status: DC

## 2016-12-17 MED ORDER — OXYCODONE HCL 5 MG/5ML PO SOLN: 5.0000 mg | Freq: Once | ORAL | Status: DC | PRN

## 2016-12-17 SURGICAL SUPPLY — 73 items
BIT DRILL 3.2 (BIT) ×1
BIT DRILL 3.2XCALB NS DISP (BIT) ×1 IMPLANT
BIT DRILL CALIBRATED 2.7 (BIT) ×2 IMPLANT
BIT DRL 3.2XCALB NS DISP (BIT) ×1
BLADE CLIPPER SURG (BLADE) IMPLANT
BLADE SURG 15 STRL LF DISP TIS (BLADE) ×1 IMPLANT
BLADE SURG 15 STRL SS (BLADE) ×1
CHLORAPREP W/TINT 26ML (MISCELLANEOUS) ×2 IMPLANT
CLSR STERI-STRIP ANTIMIC 1/2X4 (GAUZE/BANDAGES/DRESSINGS) ×2 IMPLANT
DECANTER SPIKE VIAL GLASS SM (MISCELLANEOUS) IMPLANT
DRAPE C-ARM 42X72 X-RAY (DRAPES) ×2 IMPLANT
DRAPE IMP U-DRAPE 54X76 (DRAPES) ×2 IMPLANT
DRAPE INCISE IOBAN 66X45 STRL (DRAPES) ×2 IMPLANT
DRAPE OEC MINIVIEW 54X84 (DRAPES) IMPLANT
DRAPE U-SHAPE 47X51 STRL (DRAPES) ×2 IMPLANT
DRAPE U-SHAPE 76X120 STRL (DRAPES) ×4 IMPLANT
DRSG MEPILEX BORDER 4X8 (GAUZE/BANDAGES/DRESSINGS) ×2 IMPLANT
ELECT REM PT RETURN 9FT ADLT (ELECTROSURGICAL) ×2
ELECTRODE REM PT RTRN 9FT ADLT (ELECTROSURGICAL) ×1 IMPLANT
GAUZE SPONGE 4X4 12PLY STRL (GAUZE/BANDAGES/DRESSINGS) ×2 IMPLANT
GAUZE XEROFORM 1X8 LF (GAUZE/BANDAGES/DRESSINGS) ×2 IMPLANT
GLOVE BIO SURGEON STRL SZ 6.5 (GLOVE) ×2 IMPLANT
GLOVE BIO SURGEON STRL SZ7.5 (GLOVE) ×6 IMPLANT
GLOVE BIOGEL PI IND STRL 7.0 (GLOVE) ×1 IMPLANT
GLOVE BIOGEL PI IND STRL 8 (GLOVE) ×4 IMPLANT
GLOVE BIOGEL PI INDICATOR 7.0 (GLOVE) ×1
GLOVE BIOGEL PI INDICATOR 8 (GLOVE) ×4
GOWN STRL REUS W/ TWL LRG LVL3 (GOWN DISPOSABLE) ×2 IMPLANT
GOWN STRL REUS W/ TWL XL LVL3 (GOWN DISPOSABLE) ×1 IMPLANT
GOWN STRL REUS W/TWL LRG LVL3 (GOWN DISPOSABLE) ×2
GOWN STRL REUS W/TWL XL LVL3 (GOWN DISPOSABLE) ×1
K-WIRE 2X5 SS THRDED S3 (WIRE)
KWIRE 2X5 SS THRDED S3 (WIRE) IMPLANT
NS IRRIG 1000ML POUR BTL (IV SOLUTION) ×2 IMPLANT
PACK ARTHROSCOPY DSU (CUSTOM PROCEDURE TRAY) ×2 IMPLANT
PACK BASIN DAY SURGERY FS (CUSTOM PROCEDURE TRAY) ×2 IMPLANT
PEG LOCKING 3.2MMX46 (Peg) ×4 IMPLANT
PEG LOCKING 3.2X34 (Screw) ×2 IMPLANT
PEG LOCKING 3.2X36 (Screw) ×2 IMPLANT
PEG LOCKING 3.2X40 (Peg) ×4 IMPLANT
PEG LOCKING 3.2X50 (Screw) ×2 IMPLANT
PENCIL BUTTON HOLSTER BLD 10FT (ELECTRODE) ×2 IMPLANT
PLATE 3HOLE HUMERUS PROX RT (Plate) ×2 IMPLANT
RESTRAINT HEAD UNIVERSAL NS (MISCELLANEOUS) ×2 IMPLANT
SCREW T15 MD 3.5X24MM NS (Screw) ×2 IMPLANT
SCREW T15 MD 3.5X28MM NS (Screw) ×2 IMPLANT
SCREW T15 MD 3.5X30MM NS (Screw) ×2 IMPLANT
SLEEVE SCD COMPRESS KNEE MED (MISCELLANEOUS) ×2 IMPLANT
SLING ARM FOAM STRAP LRG (SOFTGOODS) ×2 IMPLANT
SLING ARM IMMOBILIZER LRG (SOFTGOODS) ×2 IMPLANT
SLING ARM IMMOBILIZER MED (SOFTGOODS) IMPLANT
SLING ARM MED ADULT FOAM STRAP (SOFTGOODS) IMPLANT
SLING ARM XL FOAM STRAP (SOFTGOODS) IMPLANT
SPONGE LAP 18X18 X RAY DECT (DISPOSABLE) ×4 IMPLANT
STAPLER VISISTAT 35W (STAPLE) IMPLANT
SUCTION FRAZIER HANDLE 10FR (MISCELLANEOUS)
SUCTION TUBE FRAZIER 10FR DISP (MISCELLANEOUS) IMPLANT
SUPPORT WRAP ARM LG (MISCELLANEOUS) IMPLANT
SUT ETHILON 3 0 PS 1 (SUTURE) IMPLANT
SUT FIBERWIRE #2 38 T-5 BLUE (SUTURE) ×6
SUT MNCRL AB 4-0 PS2 18 (SUTURE) ×2 IMPLANT
SUT MON AB 2-0 CT1 36 (SUTURE) ×2 IMPLANT
SUT RETRIEVER MED (INSTRUMENTS) IMPLANT
SUT VIC AB 0 CT1 27 (SUTURE) ×2
SUT VIC AB 0 CT1 27XBRD ANBCTR (SUTURE) ×2 IMPLANT
SUT VIC AB 3-0 SH 27 (SUTURE)
SUT VIC AB 3-0 SH 27X BRD (SUTURE) IMPLANT
SUT VICRYL 3-0 CR8 SH (SUTURE) IMPLANT
SUTURE FIBERWR #2 38 T-5 BLUE (SUTURE) ×3 IMPLANT
SYR BULB 3OZ (MISCELLANEOUS) ×2 IMPLANT
TOWEL OR 17X24 6PK STRL BLUE (TOWEL DISPOSABLE) ×2 IMPLANT
TOWEL OR NON WOVEN STRL DISP B (DISPOSABLE) ×2 IMPLANT
YANKAUER SUCT BULB TIP NO VENT (SUCTIONS) ×2 IMPLANT

## 2016-12-17 NOTE — Anesthesia Preprocedure Evaluation (Signed)
Anesthesia Evaluation  Patient identified by MRN, date of birth, ID band Patient awake    Reviewed: Allergy & Precautions, NPO status , Patient's Chart, lab work & pertinent test results  Airway Mallampati: II  TM Distance: >3 FB Neck ROM: Full    Dental no notable dental hx.    Pulmonary neg pulmonary ROS,    Pulmonary exam normal breath sounds clear to auscultation       Cardiovascular hypertension, Pt. on medications negative cardio ROS Normal cardiovascular exam Rhythm:Regular Rate:Normal     Neuro/Psych negative neurological ROS  negative psych ROS   GI/Hepatic negative GI ROS, Neg liver ROS,   Endo/Other  negative endocrine ROSdiabetes, Type 2, Oral Hypoglycemic AgentsMorbid obesity  Renal/GU negative Renal ROS  negative genitourinary   Musculoskeletal negative musculoskeletal ROS (+)   Abdominal (+) + obese,   Peds negative pediatric ROS (+)  Hematology negative hematology ROS (+) HIV,   Anesthesia Other Findings   Reproductive/Obstetrics negative OB ROS                             Anesthesia Physical Anesthesia Plan  ASA: III  Anesthesia Plan: General   Post-op Pain Management: GA combined w/ Regional for post-op pain   Induction: Intravenous  PONV Risk Score and Plan: 3 and Ondansetron, Dexamethasone and Midazolam  Airway Management Planned: Oral ETT  Additional Equipment:   Intra-op Plan:   Post-operative Plan: Extubation in OR  Informed Consent: I have reviewed the patients History and Physical, chart, labs and discussed the procedure including the risks, benefits and alternatives for the proposed anesthesia with the patient or authorized representative who has indicated his/her understanding and acceptance.   Dental advisory given  Plan Discussed with: CRNA  Anesthesia Plan Comments:         Anesthesia Quick Evaluation

## 2016-12-17 NOTE — Discharge Instructions (Signed)
Diet: As you were doing prior to hospitalization   Dressing:  Keep dressings on and dry until follow up.  Activity:  Increase activity slowly as tolerated, but follow the weight bearing instructions below.  The rules on driving is that you can not be taking narcotics while you drive, and you must feel in control of the vehicle.    Weight Bearing:   No weight bearing right arm.  Maintain sling at all times.    To prevent constipation: you may use a stool softener such as -  Colace (over the counter) 100 mg by mouth twice a day  Drink plenty of fluids (prune juice may be helpful) and high fiber foods Miralax (over the counter) for constipation as needed.    Itching:  If you experience itching with your medications, try taking only a single pain pill, or even half a pain pill at a time.  You can also use benadryl over the counter for itching or also to help with sleep.   Precautions:  If you experience chest pain or shortness of breath - call 911 immediately for transfer to the [facility] emergency department!!  If you develop a fever greater that 101 F, purulent drainage from wound, increased redness or drainage from wound, or calf pain -- Call the office at [phone]                                                 Follow- Up Appointment:  Please call for an appointment to be seen in 1-2 weeks [place] - [phone]    Post Anesthesia Home Care Instructions  Activity: Get plenty of rest for the remainder of the day. A responsible individual must stay with you for 24 hours following the procedure.  For the next 24 hours, DO NOT: -Drive a car -Advertising copywriter -Drink alcoholic beverages -Take any medication unless instructed by your physician -Make any legal decisions or sign important papers.  Meals: Start with liquid foods such as gelatin or soup. Progress to regular foods as tolerated. Avoid greasy, spicy, heavy foods. If nausea and/or vomiting occur, drink only clear  liquids until the nausea and/or vomiting subsides. Call your physician if vomiting continues.  Special Instructions/Symptoms: Your throat may feel dry or sore from the anesthesia or the breathing tube placed in your throat during surgery. If this causes discomfort, gargle with warm salt water. The discomfort should disappear within 24 hours.  If you had a scopolamine patch placed behind your ear for the management of post- operative nausea and/or vomiting:  1. The medication in the patch is effective for 72 hours, after which it should be removed.  Wrap patch in a tissue and discard in the trash. Wash hands thoroughly with soap and water. 2. You may remove the patch earlier than 72 hours if you experience unpleasant side effects which may include dry mouth, dizziness or visual disturbances. 3. Avoid touching the patch. Wash your hands with soap and water after contact with the patch.     Regional Anesthesia Blocks  1. Numbness or the inability to move the "blocked" extremity may last from 3-48 hours after placement. The length of time depends on the medication injected and your individual response to the medication. If the numbness is not going away after 48 hours, call your surgeon.  2. The extremity that is blocked will  need to be protected until the numbness is gone and the  Strength has returned. Because you cannot feel it, you will need to take extra care to avoid injury. Because it may be weak, you may have difficulty moving it or using it. You may not know what position it is in without looking at it while the block is in effect.  3. For blocks in the legs and feet, returning to weight bearing and walking needs to be done carefully. You will need to wait until the numbness is entirely gone and the strength has returned. You should be able to move your leg and foot normally before you try and bear weight or walk. You will need someone to be with you when you first try to ensure you do not  fall and possibly risk injury.  4. Bruising and tenderness at the needle site are common side effects and will resolve in a few days.  5. Persistent numbness or new problems with movement should be communicated to the surgeon or the [facility] [phone])/ [facility] [phone]).

## 2016-12-17 NOTE — Anesthesia Procedure Notes (Signed)
Anesthesia Regional Block: Interscalene brachial plexus block   Pre-Anesthetic Checklist: ,, timeout performed, Correct Patient, Correct Site, Correct Laterality, Correct Procedure, Correct Position, site marked, Risks and benefits discussed,  Surgical consent,  Pre-op evaluation,  At surgeon's request and post-op pain management  Laterality: Right  Prep: chloraprep       Needles:  Injection technique: Single-shot  Needle Type: Stimiplex     Needle Length: 9cm  Needle Gauge: 21     Additional Needles:   Procedures:,,,, ultrasound used (permanent image in chart),,,,  Narrative:  Start time: 12/17/2016 11:50 AM End time: 12/17/2016 11:55 AM Injection made incrementally with aspirations every 5 mL.  Performed by: Personally  Anesthesiologist: Anitra LauthMILLER, Greer Koeppen RAY

## 2016-12-17 NOTE — Anesthesia Postprocedure Evaluation (Signed)
Anesthesia Post Note  Patient: Gail MalesHelen B Coffie  Procedure(s) Performed: OPEN REDUCTION INTERNAL FIXATION (ORIF) RIGHT PROXIMAL HUMERUS FRACTURE (Right Arm Upper)     Patient location during evaluation: PACU Anesthesia Type: General Level of consciousness: awake and alert Pain management: pain level controlled Vital Signs Assessment: post-procedure vital signs reviewed and stable Respiratory status: spontaneous breathing, nonlabored ventilation and respiratory function stable Cardiovascular status: blood pressure returned to baseline and stable Postop Assessment: no apparent nausea or vomiting Anesthetic complications: no    Last Vitals:  Vitals:   12/17/16 1441 12/17/16 1445  BP: (!) 133/54 137/77  Pulse: (!) 102 (!) 103  Resp: (!) 21 (!) 21  Temp: 36.5 C   SpO2: 100% 97%    Last Pain:  Vitals:   12/17/16 1515  TempSrc:   PainSc: 0-No pain                 Lowella CurbWarren Ray Amiree No

## 2016-12-17 NOTE — Progress Notes (Signed)
Assisted Dr. Miller with right, interscalene  block. Side rails up, monitors on throughout procedure. See vital signs in flow sheet. Tolerated Procedure well.  

## 2016-12-17 NOTE — Anesthesia Procedure Notes (Signed)
Procedure Name: Intubation Date/Time: 12/17/2016 12:46 PM Performed by: Maryella Shivers Pre-anesthesia Checklist: Patient identified, Emergency Drugs available, Suction available and Patient being monitored Patient Re-evaluated:Patient Re-evaluated prior to induction Oxygen Delivery Method: Circle system utilized Preoxygenation: Pre-oxygenation with 100% oxygen Induction Type: IV induction Ventilation: Mask ventilation without difficulty Laryngoscope Size: Mac and 3 Grade View: Grade I Tube type: Oral Tube size: 7.0 mm Number of attempts: 1 Airway Equipment and Method: Stylet and Oral airway Placement Confirmation: ETT inserted through vocal cords under direct vision,  positive ETCO2 and breath sounds checked- equal and bilateral Secured at: 20 cm Tube secured with: Tape Dental Injury: Teeth and Oropharynx as per pre-operative assessment

## 2016-12-17 NOTE — Interval H&P Note (Signed)
History and Physical Interval Note:  12/17/2016 12:25 PM  Gail [facility]  has presented today for surgery, with the diagnosis of Unspecified fracture of upper end of unspecified humerus  S42.209  The various methods of treatment have been discussed with the patient and family. After consideration of risks, benefits and other options for treatment, the patient has consented to  Procedure(s): OPEN REDUCTION INTERNAL FIXATION (ORIF) RIGHT PROXIMAL HUMERUS FRACTURE (Right) as a surgical intervention .  The patient's history has been reviewed, patient examined, no change in status, stable for surgery.  I have reviewed the patient's chart and labs.  Questions were answered to the patient's satisfaction.     Eshan Trupiano D

## 2016-12-17 NOTE — Op Note (Signed)
12/17/2016  1:56 PM  PATIENT:  Gail [facility]    PRE-OPERATIVE DIAGNOSIS:  Unspecified fracture of upper end of unspecified humerus  S42.209  POST-OPERATIVE DIAGNOSIS:  Same  PROCEDURE:  OPEN REDUCTION INTERNAL FIXATION (ORIF) RIGHT PROXIMAL HUMERUS FRACTURE  SURGEON:  Ailish Prospero, Jewel BaizeIMOTHY D, MD  PHYSICIAN ASSISTANT: Aquilla HackerHenry Martensen, PA-C, he was present and scrubbed throughout the case, critical for completion in a timely fashion, and for retraction, instrumentation, and closure.   ANESTHESIA:   General  PREOPERATIVE INDICATIONS:  Gail [facility] is a  61 y.o. female with a diagnosis of Unspecified fracture of upper end of unspecified humerus  S42.209 who elected for surgical management.    The risks benefits and alternatives were discussed with the patient including but not limited to the risks of nonoperative treatment, versus surgical intervention including infection, bleeding, nerve injury, malunion, nonunion, the need for revision surgery, hardware prominence, hardware failure, the need for hardware removal, blood clots, cardiopulmonary complications, conversion to arthroplasty, morbidity, mortality, among others, and they were willing to proceed.  Predicted outcome is good, although there will be at least a six to nine month expected recovery.    OPERATIVE IMPLANTS: Biomet S3 locking plate  OPERATIVE FINDINGS: Displaced proximal humerus fracture  OPERATIVE PROCEDURE: The patient was brought to the operating room and placed in the supine position. General anesthesia was administered. IV antibiotics were given. She was placed in the beach chair position. All bony prominences were padded. The upper extremity was prepped and draped in usual sterile fashion. Deltopectoral incision was performed.  I exposed the fracture site, and placed deep retractors. I did not tenotomize the biceps tendon. This was left in place. I elevated a small portion of the deltoid off of the shaft, in order to gain  access for the plate. I placed supraspinatus and subscapularis stitches, and then reduced the head onto the shaft. This was maintained in satisfactory position.  I applied the plate and secured it into the sliding hole first. I confirmed position of the reduction and the plate with C-arm, and I placed a total of 2 guidewires into the appropriate position in the head. I was satisfied that the plate was distal appropriately, and then secured the plate proximally with smooth pegs, taking care to prevent penetration into the arch articular surface, using C-arm, as well as manual feel using a hand drill.  I then secured the plate distally using another cortical screw. Once complete fixation and reduction of been achieved, took final C-arm pictures, and irrigated the wounds copiously, and repaired the deltopectoral interval with Vicryl followed by Vicryl for the subcutaneous tissue with Monocryl and Steri-Strips for the skin. She was placed in a sling. She had a preoperative regional block as well. She tolerated the procedure well with no complications.   POST OPERATIVE PLAN: Sling full time, DVT px: Ambulation and foot pumps

## 2016-12-17 NOTE — Transfer of Care (Signed)
Immediate Anesthesia Transfer of Care Note  Patient: Gail MalesHelen B [facility]  Procedure(s) Performed: OPEN REDUCTION INTERNAL FIXATION (ORIF) RIGHT PROXIMAL HUMERUS FRACTURE (Right Arm Upper)  Patient Location: PACU  Anesthesia Type:GA combined with regional for post-op pain  Level of Consciousness: sedated  Airway & Oxygen Therapy: Patient Spontanous Breathing and Patient connected to face mask oxygen  Post-op Assessment: Report given to RN and Post -op Vital signs reviewed and stable  Post vital signs: Reviewed and stable  Last Vitals:  Vitals:   12/17/16 1155 12/17/16 1200  BP: (!) 88/44 (!) 101/51  Pulse: 76 83  Resp: 19 18  Temp:    SpO2: 100% 92%    Last Pain:  Vitals:   12/17/16 1200  TempSrc:   PainSc: 0-No pain         Complications: No apparent anesthesia complications

## 2016-12-21 ENCOUNTER — Encounter (HOSPITAL_BASED_OUTPATIENT_CLINIC_OR_DEPARTMENT_OTHER): Payer: Self-pay | Admitting: Orthopedic Surgery

## 2016-12-27 ENCOUNTER — Other Ambulatory Visit: Payer: Self-pay | Admitting: Infectious Diseases

## 2016-12-27 DIAGNOSIS — B20 Human immunodeficiency virus [HIV] disease: Secondary | ICD-10-CM

## 2016-12-29 DIAGNOSIS — E785 Hyperlipidemia, unspecified: Secondary | ICD-10-CM | POA: Diagnosis not present

## 2016-12-29 DIAGNOSIS — S42201D Unspecified fracture of upper end of right humerus, subsequent encounter for fracture with routine healing: Secondary | ICD-10-CM | POA: Diagnosis not present

## 2017-01-03 ENCOUNTER — Ambulatory Visit (INDEPENDENT_AMBULATORY_CARE_PROVIDER_SITE_OTHER): Payer: 59 | Admitting: Infectious Diseases

## 2017-01-03 ENCOUNTER — Encounter: Payer: Self-pay | Admitting: Infectious Diseases

## 2017-01-03 ENCOUNTER — Other Ambulatory Visit: Payer: Self-pay | Admitting: *Deleted

## 2017-01-03 DIAGNOSIS — G4700 Insomnia, unspecified: Secondary | ICD-10-CM | POA: Diagnosis not present

## 2017-01-03 DIAGNOSIS — R739 Hyperglycemia, unspecified: Secondary | ICD-10-CM | POA: Diagnosis not present

## 2017-01-03 DIAGNOSIS — I10 Essential (primary) hypertension: Secondary | ICD-10-CM | POA: Diagnosis not present

## 2017-01-03 DIAGNOSIS — E559 Vitamin D deficiency, unspecified: Secondary | ICD-10-CM | POA: Diagnosis not present

## 2017-01-03 DIAGNOSIS — B20 Human immunodeficiency virus [HIV] disease: Secondary | ICD-10-CM | POA: Diagnosis not present

## 2017-01-03 DIAGNOSIS — G4709 Other insomnia: Secondary | ICD-10-CM

## 2017-01-03 MED ORDER — CLOBETASOL PROPIONATE 0.05 % EX SOLN: 1.0000 "application " | Freq: Two times a day (BID) | CUTANEOUS | 12 refills | Status: DC

## 2017-01-03 MED ORDER — TEMAZEPAM 15 MG PO CAPS: 15.0000 mg | ORAL_CAPSULE | Freq: Every evening | ORAL | 2 refills | Status: DC | PRN

## 2017-01-03 MED ORDER — EMTRICITAB-RILPIVIR-TENOFOV AF 200-25-25 MG PO TABS: 1.0000 | ORAL_TABLET | Freq: Every day | ORAL | 4 refills | Status: DC

## 2017-01-03 NOTE — Assessment & Plan Note (Signed)
Encouraged her to lose wt, she may be able to come off metformin

## 2017-01-03 NOTE — Assessment & Plan Note (Addendum)
She is doing well. Has gotten flu shot.  Health maintenance up to date. Greatly appreciate PCP rtc in 1 year.
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
## 2017-01-03 NOTE — Assessment & Plan Note (Signed)
Now on metformin Food diary Going to gym.

## 2017-01-03 NOTE — Assessment & Plan Note (Signed)
Will refill restoril 

## 2017-01-03 NOTE — Assessment & Plan Note (Signed)
She will f/u with her PCP, markedly elevated today.

## 2017-01-04 ENCOUNTER — Other Ambulatory Visit: Payer: Self-pay | Admitting: Infectious Diseases

## 2017-01-04 DIAGNOSIS — B20 Human immunodeficiency virus [HIV] disease: Secondary | ICD-10-CM

## 2017-02-09 DIAGNOSIS — S42201D Unspecified fracture of upper end of right humerus, subsequent encounter for fracture with routine healing: Secondary | ICD-10-CM | POA: Diagnosis not present

## 2017-02-17 DIAGNOSIS — R531 Weakness: Secondary | ICD-10-CM | POA: Diagnosis not present

## 2017-02-17 DIAGNOSIS — M25611 Stiffness of right shoulder, not elsewhere classified: Secondary | ICD-10-CM | POA: Diagnosis not present

## 2017-02-17 DIAGNOSIS — M25511 Pain in right shoulder: Secondary | ICD-10-CM | POA: Diagnosis not present

## 2017-02-24 DIAGNOSIS — R531 Weakness: Secondary | ICD-10-CM | POA: Diagnosis not present

## 2017-02-24 DIAGNOSIS — M25511 Pain in right shoulder: Secondary | ICD-10-CM | POA: Diagnosis not present

## 2017-02-24 DIAGNOSIS — M25611 Stiffness of right shoulder, not elsewhere classified: Secondary | ICD-10-CM | POA: Diagnosis not present

## 2017-03-03 DIAGNOSIS — M25511 Pain in right shoulder: Secondary | ICD-10-CM | POA: Diagnosis not present

## 2017-03-03 DIAGNOSIS — R531 Weakness: Secondary | ICD-10-CM | POA: Diagnosis not present

## 2017-03-03 DIAGNOSIS — M25611 Stiffness of right shoulder, not elsewhere classified: Secondary | ICD-10-CM | POA: Diagnosis not present

## 2017-03-08 DIAGNOSIS — M25511 Pain in right shoulder: Secondary | ICD-10-CM | POA: Diagnosis not present

## 2017-03-08 DIAGNOSIS — R531 Weakness: Secondary | ICD-10-CM | POA: Diagnosis not present

## 2017-03-08 DIAGNOSIS — M25611 Stiffness of right shoulder, not elsewhere classified: Secondary | ICD-10-CM | POA: Diagnosis not present

## 2017-03-09 ENCOUNTER — Telehealth: Payer: Self-pay | Admitting: *Deleted

## 2017-03-09 DIAGNOSIS — G4709 Other insomnia: Secondary | ICD-10-CM

## 2017-03-09 MED ORDER — TEMAZEPAM 15 MG PO CAPS: 15.0000 mg | ORAL_CAPSULE | Freq: Every evening | ORAL | 1 refills | Status: DC | PRN

## 2017-03-09 NOTE — Telephone Encounter (Signed)
Requesting new Temazepam rx with refill.

## 2017-03-15 DIAGNOSIS — M25511 Pain in right shoulder: Secondary | ICD-10-CM | POA: Diagnosis not present

## 2017-03-15 DIAGNOSIS — R531 Weakness: Secondary | ICD-10-CM | POA: Diagnosis not present

## 2017-03-15 DIAGNOSIS — M25611 Stiffness of right shoulder, not elsewhere classified: Secondary | ICD-10-CM | POA: Diagnosis not present

## 2017-03-17 DIAGNOSIS — M25611 Stiffness of right shoulder, not elsewhere classified: Secondary | ICD-10-CM | POA: Diagnosis not present

## 2017-03-17 DIAGNOSIS — M25511 Pain in right shoulder: Secondary | ICD-10-CM | POA: Diagnosis not present

## 2017-03-17 DIAGNOSIS — R531 Weakness: Secondary | ICD-10-CM | POA: Diagnosis not present

## 2017-03-22 DIAGNOSIS — R531 Weakness: Secondary | ICD-10-CM | POA: Diagnosis not present

## 2017-03-22 DIAGNOSIS — M25511 Pain in right shoulder: Secondary | ICD-10-CM | POA: Diagnosis not present

## 2017-03-22 DIAGNOSIS — M25611 Stiffness of right shoulder, not elsewhere classified: Secondary | ICD-10-CM | POA: Diagnosis not present

## 2017-03-23 DIAGNOSIS — M25511 Pain in right shoulder: Secondary | ICD-10-CM | POA: Diagnosis not present

## 2017-03-24 DIAGNOSIS — R531 Weakness: Secondary | ICD-10-CM | POA: Diagnosis not present

## 2017-03-24 DIAGNOSIS — M25511 Pain in right shoulder: Secondary | ICD-10-CM | POA: Diagnosis not present

## 2017-03-24 DIAGNOSIS — M25611 Stiffness of right shoulder, not elsewhere classified: Secondary | ICD-10-CM | POA: Diagnosis not present

## 2017-03-29 DIAGNOSIS — M25611 Stiffness of right shoulder, not elsewhere classified: Secondary | ICD-10-CM | POA: Diagnosis not present

## 2017-03-29 DIAGNOSIS — R531 Weakness: Secondary | ICD-10-CM | POA: Diagnosis not present

## 2017-03-29 DIAGNOSIS — M25511 Pain in right shoulder: Secondary | ICD-10-CM | POA: Diagnosis not present

## 2017-03-31 DIAGNOSIS — M25511 Pain in right shoulder: Secondary | ICD-10-CM | POA: Diagnosis not present

## 2017-03-31 DIAGNOSIS — R531 Weakness: Secondary | ICD-10-CM | POA: Diagnosis not present

## 2017-03-31 DIAGNOSIS — M25611 Stiffness of right shoulder, not elsewhere classified: Secondary | ICD-10-CM | POA: Diagnosis not present

## 2017-04-05 DIAGNOSIS — M25611 Stiffness of right shoulder, not elsewhere classified: Secondary | ICD-10-CM | POA: Diagnosis not present

## 2017-04-05 DIAGNOSIS — S42201D Unspecified fracture of upper end of right humerus, subsequent encounter for fracture with routine healing: Secondary | ICD-10-CM | POA: Diagnosis not present

## 2017-04-05 DIAGNOSIS — M25511 Pain in right shoulder: Secondary | ICD-10-CM | POA: Diagnosis not present

## 2017-04-12 DIAGNOSIS — M25611 Stiffness of right shoulder, not elsewhere classified: Secondary | ICD-10-CM | POA: Diagnosis not present

## 2017-04-12 DIAGNOSIS — M25511 Pain in right shoulder: Secondary | ICD-10-CM | POA: Diagnosis not present

## 2017-04-12 DIAGNOSIS — H16141 Punctate keratitis, right eye: Secondary | ICD-10-CM | POA: Diagnosis not present

## 2017-04-12 DIAGNOSIS — H5203 Hypermetropia, bilateral: Secondary | ICD-10-CM | POA: Diagnosis not present

## 2017-04-12 DIAGNOSIS — S42201D Unspecified fracture of upper end of right humerus, subsequent encounter for fracture with routine healing: Secondary | ICD-10-CM | POA: Diagnosis not present

## 2017-04-14 DIAGNOSIS — Z0000 Encounter for general adult medical examination without abnormal findings: Secondary | ICD-10-CM | POA: Diagnosis not present

## 2017-04-14 DIAGNOSIS — S42201D Unspecified fracture of upper end of right humerus, subsequent encounter for fracture with routine healing: Secondary | ICD-10-CM | POA: Diagnosis not present

## 2017-04-14 DIAGNOSIS — M25611 Stiffness of right shoulder, not elsewhere classified: Secondary | ICD-10-CM | POA: Diagnosis not present

## 2017-04-14 DIAGNOSIS — E785 Hyperlipidemia, unspecified: Secondary | ICD-10-CM | POA: Diagnosis not present

## 2017-04-14 DIAGNOSIS — I10 Essential (primary) hypertension: Secondary | ICD-10-CM | POA: Diagnosis not present

## 2017-04-14 DIAGNOSIS — M25511 Pain in right shoulder: Secondary | ICD-10-CM | POA: Diagnosis not present

## 2017-04-19 DIAGNOSIS — M25611 Stiffness of right shoulder, not elsewhere classified: Secondary | ICD-10-CM | POA: Diagnosis not present

## 2017-04-19 DIAGNOSIS — S42201D Unspecified fracture of upper end of right humerus, subsequent encounter for fracture with routine healing: Secondary | ICD-10-CM | POA: Diagnosis not present

## 2017-04-19 DIAGNOSIS — M25511 Pain in right shoulder: Secondary | ICD-10-CM | POA: Diagnosis not present

## 2017-04-21 DIAGNOSIS — S42201D Unspecified fracture of upper end of right humerus, subsequent encounter for fracture with routine healing: Secondary | ICD-10-CM | POA: Diagnosis not present

## 2017-04-21 DIAGNOSIS — M25611 Stiffness of right shoulder, not elsewhere classified: Secondary | ICD-10-CM | POA: Diagnosis not present

## 2017-04-21 DIAGNOSIS — M25511 Pain in right shoulder: Secondary | ICD-10-CM | POA: Diagnosis not present

## 2017-04-26 DIAGNOSIS — M6281 Muscle weakness (generalized): Secondary | ICD-10-CM | POA: Diagnosis not present

## 2017-04-26 DIAGNOSIS — M25611 Stiffness of right shoulder, not elsewhere classified: Secondary | ICD-10-CM | POA: Diagnosis not present

## 2017-04-26 DIAGNOSIS — M25511 Pain in right shoulder: Secondary | ICD-10-CM | POA: Diagnosis not present

## 2017-04-27 DIAGNOSIS — M25511 Pain in right shoulder: Secondary | ICD-10-CM | POA: Diagnosis not present

## 2017-04-29 DIAGNOSIS — M25511 Pain in right shoulder: Secondary | ICD-10-CM | POA: Diagnosis not present

## 2017-04-29 DIAGNOSIS — M25611 Stiffness of right shoulder, not elsewhere classified: Secondary | ICD-10-CM | POA: Diagnosis not present

## 2017-04-29 DIAGNOSIS — S42201D Unspecified fracture of upper end of right humerus, subsequent encounter for fracture with routine healing: Secondary | ICD-10-CM | POA: Diagnosis not present

## 2017-05-03 DIAGNOSIS — M25611 Stiffness of right shoulder, not elsewhere classified: Secondary | ICD-10-CM | POA: Diagnosis not present

## 2017-05-03 DIAGNOSIS — M25511 Pain in right shoulder: Secondary | ICD-10-CM | POA: Diagnosis not present

## 2017-05-03 DIAGNOSIS — S42201D Unspecified fracture of upper end of right humerus, subsequent encounter for fracture with routine healing: Secondary | ICD-10-CM | POA: Diagnosis not present

## 2017-05-04 DIAGNOSIS — E785 Hyperlipidemia, unspecified: Secondary | ICD-10-CM | POA: Diagnosis not present

## 2017-05-05 DIAGNOSIS — S42201D Unspecified fracture of upper end of right humerus, subsequent encounter for fracture with routine healing: Secondary | ICD-10-CM | POA: Diagnosis not present

## 2017-05-05 DIAGNOSIS — M25611 Stiffness of right shoulder, not elsewhere classified: Secondary | ICD-10-CM | POA: Diagnosis not present

## 2017-05-05 DIAGNOSIS — M25511 Pain in right shoulder: Secondary | ICD-10-CM | POA: Diagnosis not present

## 2017-05-10 DIAGNOSIS — S42201D Unspecified fracture of upper end of right humerus, subsequent encounter for fracture with routine healing: Secondary | ICD-10-CM | POA: Diagnosis not present

## 2017-05-10 DIAGNOSIS — M25611 Stiffness of right shoulder, not elsewhere classified: Secondary | ICD-10-CM | POA: Diagnosis not present

## 2017-05-10 DIAGNOSIS — M25511 Pain in right shoulder: Secondary | ICD-10-CM | POA: Diagnosis not present

## 2017-05-12 DIAGNOSIS — M25511 Pain in right shoulder: Secondary | ICD-10-CM | POA: Diagnosis not present

## 2017-05-12 DIAGNOSIS — M25611 Stiffness of right shoulder, not elsewhere classified: Secondary | ICD-10-CM | POA: Diagnosis not present

## 2017-05-12 DIAGNOSIS — S42201D Unspecified fracture of upper end of right humerus, subsequent encounter for fracture with routine healing: Secondary | ICD-10-CM | POA: Diagnosis not present

## 2017-07-11 ENCOUNTER — Other Ambulatory Visit: Payer: Self-pay | Admitting: Infectious Diseases

## 2017-07-11 DIAGNOSIS — G4709 Other insomnia: Secondary | ICD-10-CM

## 2017-07-29 DIAGNOSIS — Z7689 Persons encountering health services in other specified circumstances: Secondary | ICD-10-CM | POA: Diagnosis not present

## 2017-10-03 ENCOUNTER — Other Ambulatory Visit: Payer: Self-pay | Admitting: Internal Medicine

## 2017-10-03 DIAGNOSIS — Z1231 Encounter for screening mammogram for malignant neoplasm of breast: Secondary | ICD-10-CM

## 2017-11-08 DIAGNOSIS — E785 Hyperlipidemia, unspecified: Secondary | ICD-10-CM | POA: Diagnosis not present

## 2017-11-08 DIAGNOSIS — I10 Essential (primary) hypertension: Secondary | ICD-10-CM | POA: Diagnosis not present

## 2017-11-15 DIAGNOSIS — Z23 Encounter for immunization: Secondary | ICD-10-CM | POA: Diagnosis not present

## 2017-11-17 ENCOUNTER — Ambulatory Visit
Admission: RE | Admit: 2017-11-17 | Discharge: 2017-11-17 | Disposition: A | Discharge status: Home / Self Care | Payer: 59 | Source: Ambulatory Visit | Attending: Internal Medicine | Admitting: Internal Medicine

## 2017-11-17 DIAGNOSIS — Z1231 Encounter for screening mammogram for malignant neoplasm of breast: Secondary | ICD-10-CM

## 2017-11-17 IMAGING — MG DIGITAL SCREENING BILATERAL MAMMOGRAM WITH CAD
6 series · 6 of 6 positions shown · non-contrast
Comparison: Previous exam(s).

CLINICAL DATA: Screening.

EXAM:
DIGITAL SCREENING BILATERAL MAMMOGRAM WITH CAD

[R CC]
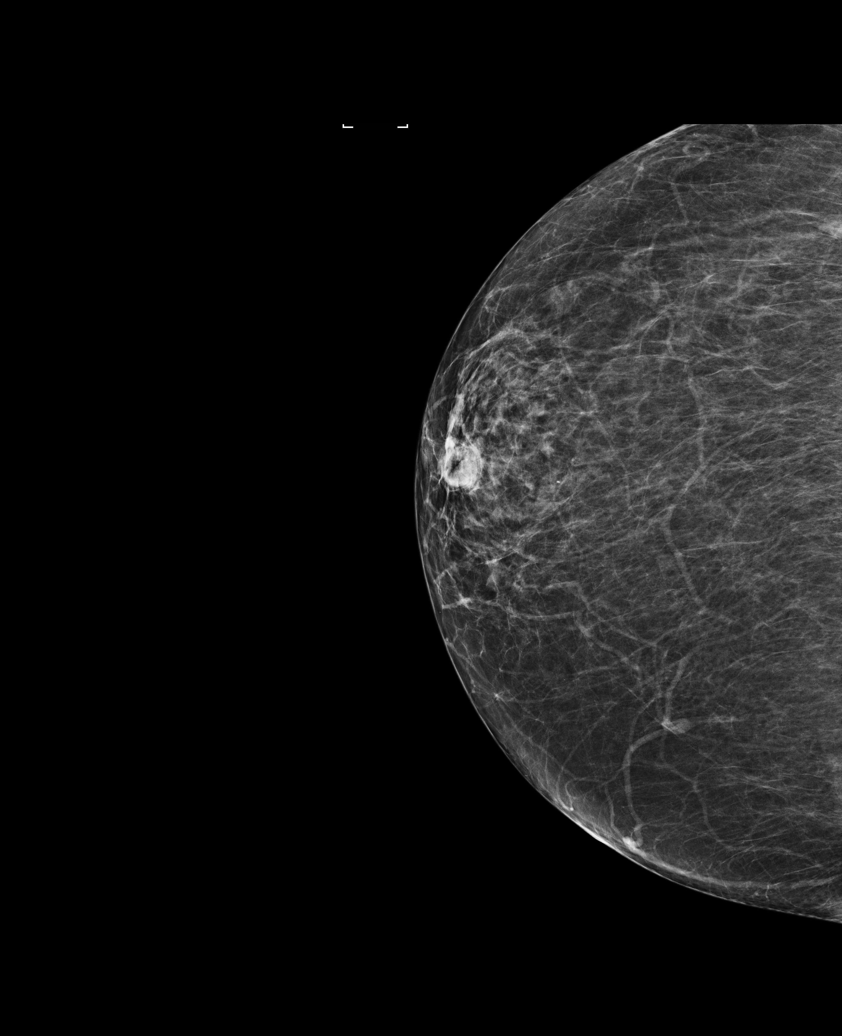

[L MLO (1 of 2)]
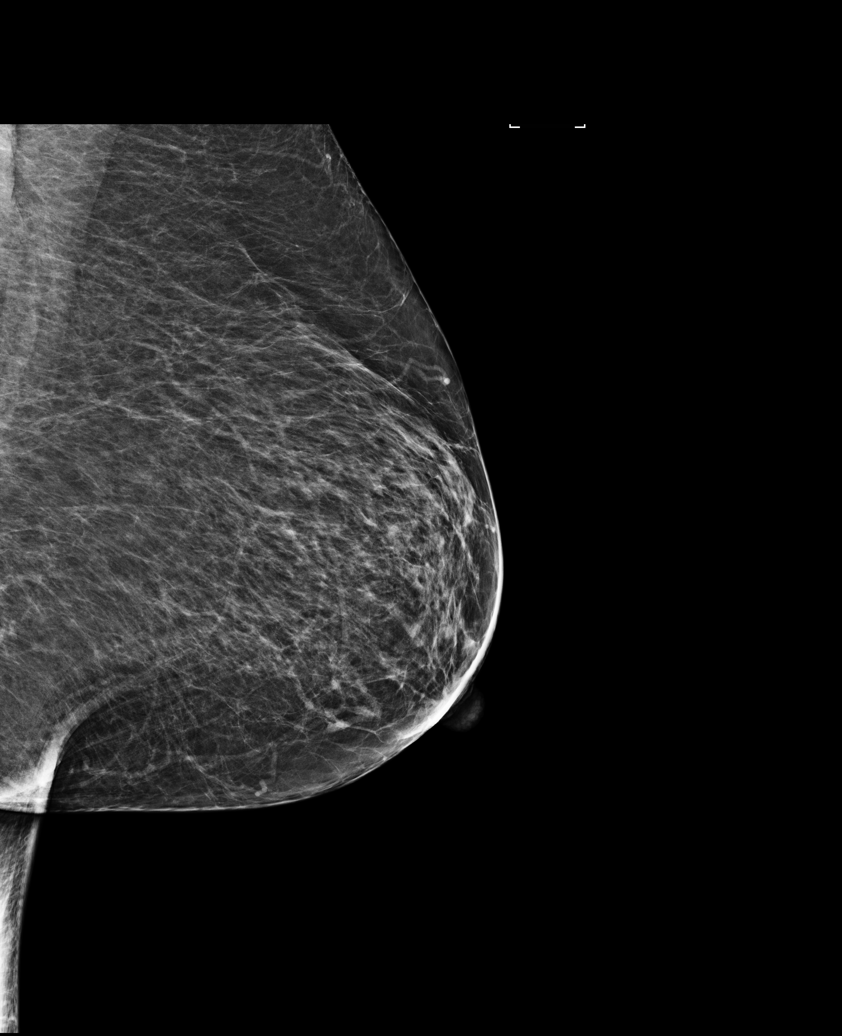

[R MLO (1 of 2)]
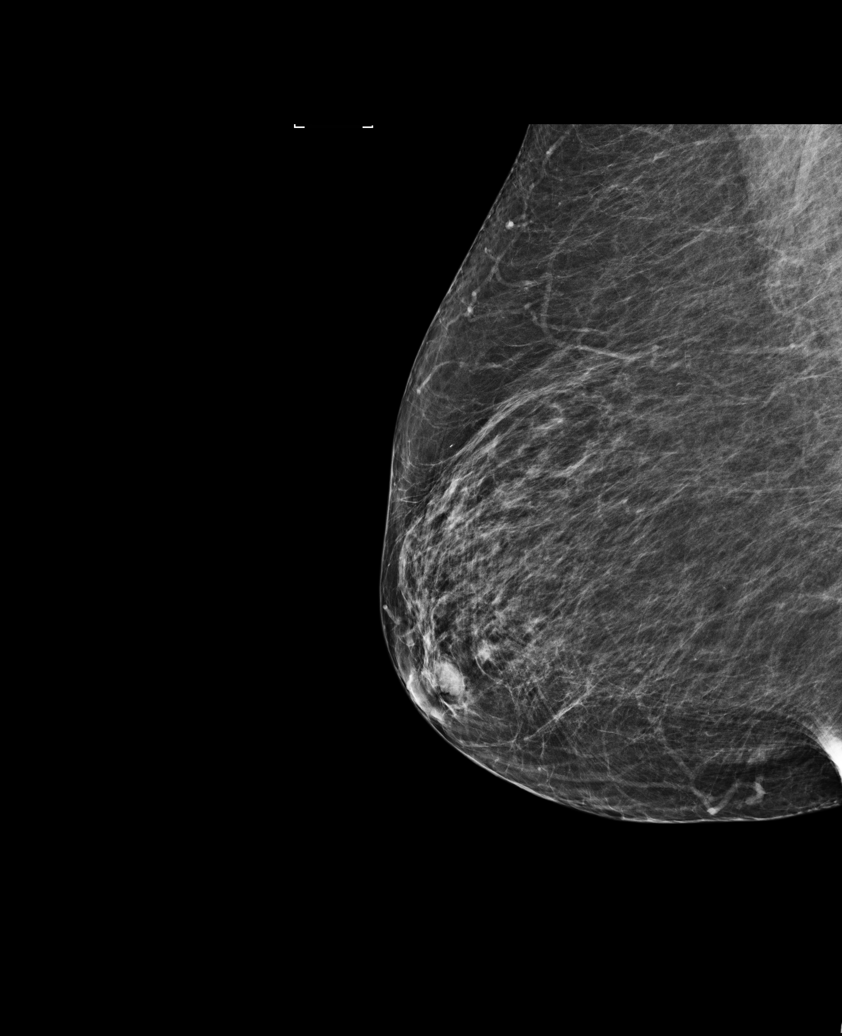

[L CC]
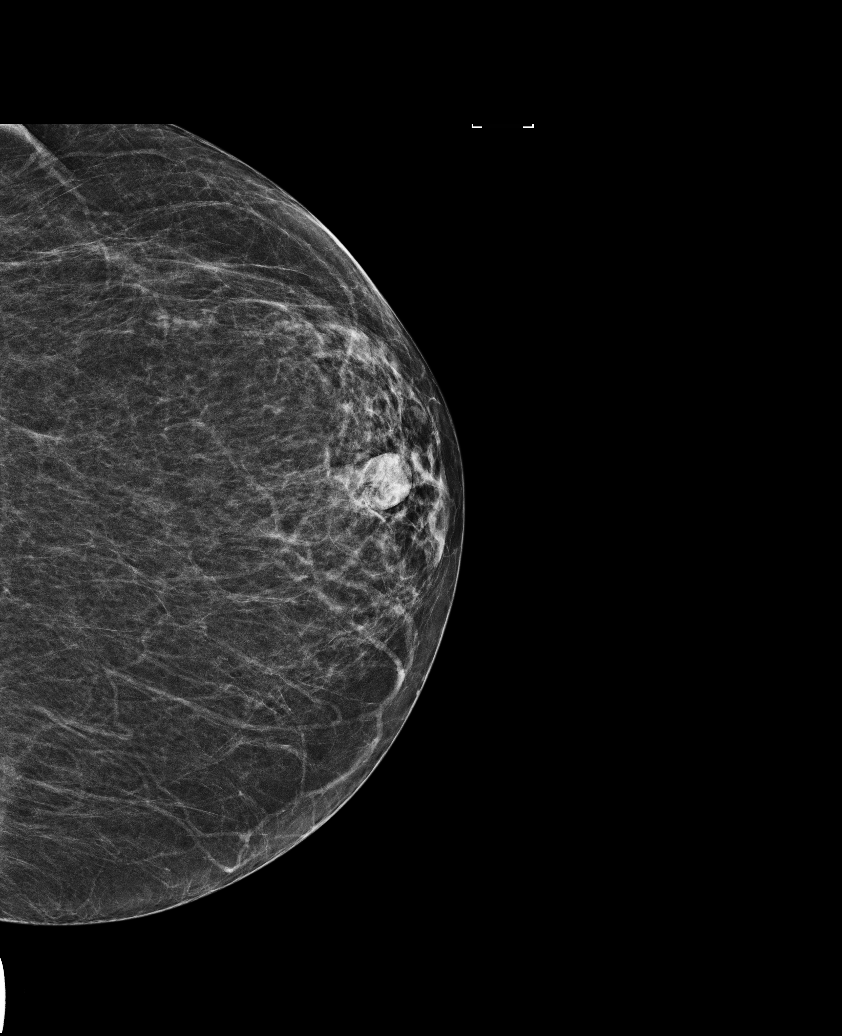

[R MLO (2 of 2)]
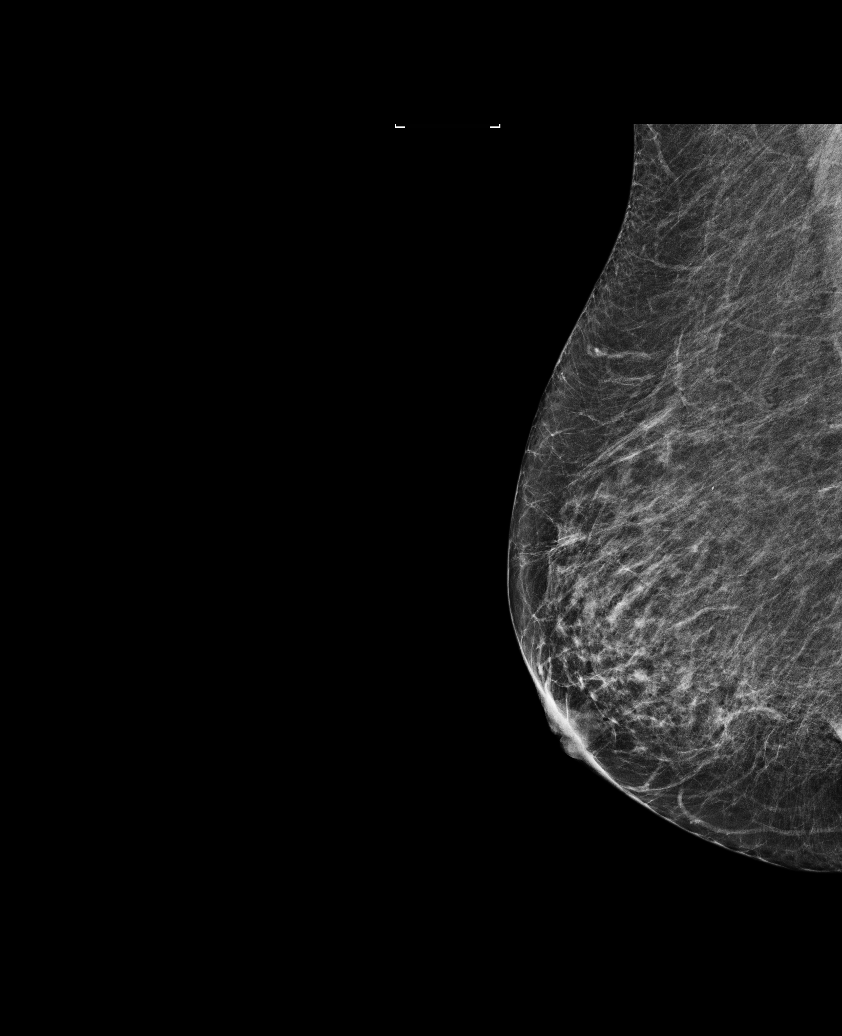

[L MLO (2 of 2)]
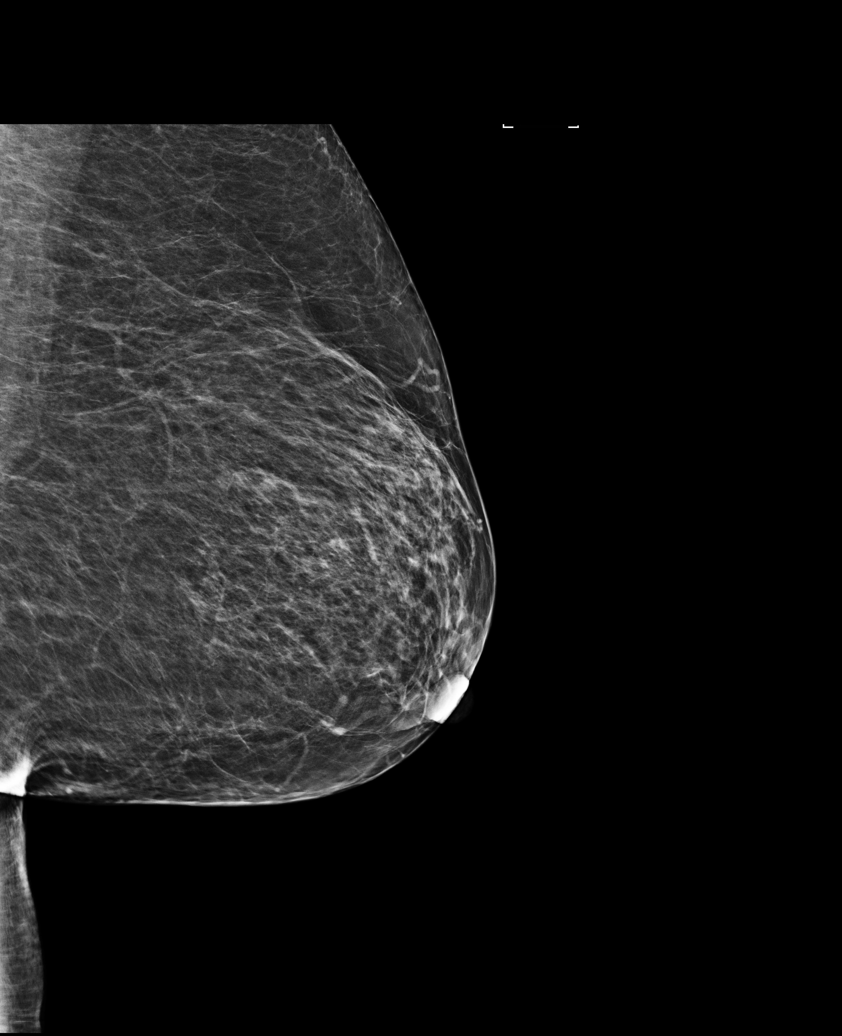

[6 of 6 positions shown; findings below may reference images not displayed]

ACR Breast Density Category b: There are scattered areas of
fibroglandular density.
FINDINGS: There are no findings suspicious for malignancy. Images were
processed with CAD.
IMPRESSION: No mammographic evidence of malignancy. A result letter of this
screening mammogram will be mailed directly to the patient.

RECOMMENDATION:
Screening mammogram in one year. (Code:[US])

BI-RADS CATEGORY  1: Negative.

## 2017-11-21 DIAGNOSIS — Z0000 Encounter for general adult medical examination without abnormal findings: Secondary | ICD-10-CM | POA: Diagnosis not present

## 2018-01-04 ENCOUNTER — Encounter: Payer: Self-pay | Admitting: Infectious Diseases

## 2018-01-04 ENCOUNTER — Ambulatory Visit (INDEPENDENT_AMBULATORY_CARE_PROVIDER_SITE_OTHER): Payer: 59 | Admitting: Infectious Diseases

## 2018-01-04 DIAGNOSIS — B20 Human immunodeficiency virus [HIV] disease: Secondary | ICD-10-CM | POA: Diagnosis not present

## 2018-01-04 DIAGNOSIS — G4709 Other insomnia: Secondary | ICD-10-CM | POA: Diagnosis not present

## 2018-01-04 DIAGNOSIS — R87612 Low grade squamous intraepithelial lesion on cytologic smear of cervix (LGSIL): Secondary | ICD-10-CM

## 2018-01-04 DIAGNOSIS — I10 Essential (primary) hypertension: Secondary | ICD-10-CM

## 2018-01-04 DIAGNOSIS — L408 Other psoriasis: Secondary | ICD-10-CM

## 2018-01-04 DIAGNOSIS — T148XXA Other injury of unspecified body region, initial encounter: Secondary | ICD-10-CM

## 2018-01-04 DIAGNOSIS — E7849 Other hyperlipidemia: Secondary | ICD-10-CM

## 2018-01-04 DIAGNOSIS — Z6836 Body mass index (BMI) 36.0-36.9, adult: Secondary | ICD-10-CM

## 2018-01-04 DIAGNOSIS — R739 Hyperglycemia, unspecified: Secondary | ICD-10-CM

## 2018-01-04 DIAGNOSIS — L659 Nonscarring hair loss, unspecified: Secondary | ICD-10-CM

## 2018-01-04 MED ORDER — TEMAZEPAM 15 MG PO CAPS: ORAL_CAPSULE | ORAL | 1 refills | Status: DC

## 2018-01-04 MED ORDER — EMTRICITAB-RILPIVIR-TENOFOV AF 200-25-25 MG PO TABS: 1.0000 | ORAL_TABLET | Freq: Every day | ORAL | 4 refills | Status: DC

## 2018-01-04 MED ORDER — CLOBETASOL PROPIONATE 0.05 % EX OINT: TOPICAL_OINTMENT | CUTANEOUS | 5 refills | Status: DC

## 2018-01-04 MED ORDER — CLOBETASOL PROPIONATE 0.05 % EX SOLN: 1.0000 "application " | Freq: Two times a day (BID) | CUTANEOUS | 12 refills | Status: DC

## 2018-01-04 NOTE — Assessment & Plan Note (Signed)
She has lost a sig amt of wt.  Will mark as resolved.

## 2018-01-04 NOTE — Assessment & Plan Note (Signed)
Will refill her restoril.  

## 2018-01-04 NOTE — Assessment & Plan Note (Signed)
Multiple possible causes- wt loss/vitamin deficiency, topical steroid use, age.  I encouraged her to take MVI

## 2018-01-04 NOTE — Assessment & Plan Note (Signed)
She is off metformin.  Will mark as resolved.

## 2018-01-04 NOTE — Progress Notes (Signed)
   Subjective:    Patient ID: Gail Edwards, female    DOB: 21-Jan-1956, 62 y.o.   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  Has gotten IM PCP at [facility].  CD4 710, HIV RNA und, Chol 215 (12-14-17).  Has had health maintenance updates- Td 10/10/15, flu 11/21/15, PNVx 8/18,  Mammo 11/15/2017 (normal)  Pap 10/10/15 Colonoscopy pending.  Bone density 11/20/15.  Had normal TSH 2018  Has been doing well. Her son is married and grandchild in Dec.  She is worried about hair loss. She has lost 100# via diet, exercise. Has begun to enjoy exercise and has hip pain if she does not walk.  On odefsy. Has been able to come off metformin. BP improved.  Worried about hair loss.   Review of Systems  Constitutional: Negative for appetite change and unexpected weight change.  Respiratory: Negative for shortness of breath.   Cardiovascular: Negative for chest pain.  Gastrointestinal: Negative for constipation and diarrhea.  Genitourinary: Negative for difficulty urinating.  Neurological: Negative for headaches.  Hematological: Bruises/bleeds easily.  Please see HPI. All other systems reviewed and negative.      Objective:   Physical Exam  Constitutional: She is oriented to person, place, and time. She appears well-developed and well-nourished.  HENT:  Mouth/Throat: No oropharyngeal exudate.  Eyes: Pupils are equal, round, and reactive to light. EOM are normal.  Neck: Normal range of motion. Neck supple.  Cardiovascular: Normal rate, regular rhythm and normal heart sounds.  Pulmonary/Chest: Effort normal and breath sounds normal.  Abdominal: Soft. Bowel sounds are normal. She exhibits no distension. There is no tenderness.  Musculoskeletal: She exhibits no edema.  Lymphadenopathy:    She has no cervical adenopathy.  Neurological: She is alert and oriented to person, place, and time.  Psychiatric: She has a normal mood and affect.      Assessment & Plan:

## 2018-01-04 NOTE — Assessment & Plan Note (Signed)
Will refil her clobetasols Not clear if this is related to her easy bruising.

## 2018-01-04 NOTE — Assessment & Plan Note (Signed)
She continues on statin LFTs normal.

## 2018-01-04 NOTE — Assessment & Plan Note (Signed)
No change in rx Will f/u with PCP Improved today (with wt loss?)

## 2018-01-04 NOTE — Assessment & Plan Note (Signed)
Has had normal pap in last year

## 2018-01-04 NOTE — Assessment & Plan Note (Signed)
She is doing well Gets labs at work Has had flu and shingles.  Will f/u with PCP for Colon (she is defering) U=U (she and husband both HIV+ but undetectable).  Will rtc in 1 year

## 2018-01-04 NOTE — Assessment & Plan Note (Signed)
We discussed possible referral to derm (she defers) This is very mild.  Will continue to watch.

## 2018-01-06 ENCOUNTER — Other Ambulatory Visit: Payer: Self-pay | Admitting: Infectious Diseases

## 2018-01-06 DIAGNOSIS — G4709 Other insomnia: Secondary | ICD-10-CM

## 2018-01-06 DIAGNOSIS — B20 Human immunodeficiency virus [HIV] disease: Secondary | ICD-10-CM

## 2018-01-06 MED ORDER — TEMAZEPAM 15 MG PO CAPS: ORAL_CAPSULE | ORAL | 1 refills | Status: DC

## 2018-01-06 MED ORDER — CLOBETASOL PROPIONATE 0.05 % EX OINT: TOPICAL_OINTMENT | CUTANEOUS | 5 refills | Status: DC

## 2018-01-06 MED ORDER — CLOBETASOL PROPIONATE 0.05 % EX SOLN: 1.0000 "application " | Freq: Two times a day (BID) | CUTANEOUS | 12 refills | Status: DC

## 2018-02-02 ENCOUNTER — Other Ambulatory Visit: Payer: Self-pay | Admitting: Infectious Diseases

## 2018-02-02 DIAGNOSIS — G4709 Other insomnia: Secondary | ICD-10-CM

## 2018-02-07 ENCOUNTER — Other Ambulatory Visit: Payer: Self-pay | Admitting: Behavioral Health

## 2018-02-07 DIAGNOSIS — G4709 Other insomnia: Secondary | ICD-10-CM

## 2018-02-07 MED ORDER — TEMAZEPAM 15 MG PO CAPS: ORAL_CAPSULE | ORAL | 1 refills | Status: DC

## 2018-04-25 ENCOUNTER — Other Ambulatory Visit: Payer: Self-pay | Admitting: Internal Medicine

## 2018-04-25 DIAGNOSIS — M8580 Other specified disorders of bone density and structure, unspecified site: Secondary | ICD-10-CM

## 2018-05-10 DIAGNOSIS — R110 Nausea: Secondary | ICD-10-CM | POA: Diagnosis not present

## 2018-06-08 ENCOUNTER — Other Ambulatory Visit: Payer: Self-pay | Admitting: Behavioral Health

## 2018-06-08 DIAGNOSIS — B20 Human immunodeficiency virus [HIV] disease: Secondary | ICD-10-CM

## 2018-06-08 MED ORDER — EMTRICITAB-RILPIVIR-TENOFOV AF 200-25-25 MG PO TABS: 1.0000 | ORAL_TABLET | Freq: Every day | ORAL | 1 refills | Status: DC

## 2018-06-12 ENCOUNTER — Other Ambulatory Visit: Payer: Self-pay | Admitting: Infectious Diseases

## 2018-06-12 DIAGNOSIS — G4709 Other insomnia: Secondary | ICD-10-CM

## 2018-06-27 ENCOUNTER — Other Ambulatory Visit: Payer: 59

## 2018-08-03 ENCOUNTER — Telehealth: Payer: Self-pay | Admitting: *Deleted

## 2018-08-03 NOTE — Telephone Encounter (Signed)
Received fax from [facility]. Patient will need to have her temazepam escribed with fingerprint to [facility] from now on.

## 2018-08-04 NOTE — Telephone Encounter (Signed)
Thank you :)

## 2018-08-09 ENCOUNTER — Other Ambulatory Visit: Payer: Self-pay

## 2018-08-09 ENCOUNTER — Ambulatory Visit
Admission: RE | Admit: 2018-08-09 | Discharge: 2018-08-09 | Disposition: A | Discharge status: Home / Self Care | Payer: 59 | Source: Ambulatory Visit | Attending: Internal Medicine | Admitting: Internal Medicine

## 2018-08-09 DIAGNOSIS — M8580 Other specified disorders of bone density and structure, unspecified site: Secondary | ICD-10-CM

## 2018-08-29 ENCOUNTER — Other Ambulatory Visit: Payer: Self-pay | Admitting: *Deleted

## 2018-08-29 DIAGNOSIS — B20 Human immunodeficiency virus [HIV] disease: Secondary | ICD-10-CM

## 2018-08-29 MED ORDER — ODEFSEY 200-25-25 MG PO TABS: 1.0000 | ORAL_TABLET | Freq: Every day | ORAL | 1 refills | Status: DC

## 2018-10-10 ENCOUNTER — Other Ambulatory Visit: Payer: Self-pay | Admitting: Internal Medicine

## 2018-10-10 DIAGNOSIS — Z1231 Encounter for screening mammogram for malignant neoplasm of breast: Secondary | ICD-10-CM

## 2018-11-06 ENCOUNTER — Telehealth: Payer: Self-pay

## 2018-11-06 NOTE — Telephone Encounter (Signed)
Patient is calling for lab orders.  She will need script with labs and mailed to her home address. I will forward script to Dr Johnnye Sima to sign.   Once it is signed I will mail to patient.    Laverle Patter, RN

## 2018-11-07 NOTE — Telephone Encounter (Signed)
Received signed order from Dr. Johnnye Sima for labs. Placed in outgoing mail for delivery to home per patient request. Called patient and made aware. Gail Edwards

## 2018-11-15 ENCOUNTER — Encounter: Payer: Self-pay | Admitting: Infectious Diseases

## 2018-11-27 ENCOUNTER — Other Ambulatory Visit: Payer: Self-pay

## 2018-11-27 ENCOUNTER — Ambulatory Visit
Admission: RE | Admit: 2018-11-27 | Discharge: 2018-11-27 | Disposition: A | Discharge status: Home / Self Care | Payer: 59 | Source: Ambulatory Visit | Attending: Internal Medicine | Admitting: Internal Medicine

## 2018-11-27 DIAGNOSIS — Z1231 Encounter for screening mammogram for malignant neoplasm of breast: Secondary | ICD-10-CM

## 2018-11-27 IMAGING — MG MM DIGITAL SCREENING BILAT W/ CAD
6 series · 6 of 6 positions shown · non-contrast
Comparison: Previous exam(s).

CLINICAL DATA: Screening.

EXAM:
DIGITAL SCREENING BILATERAL MAMMOGRAM WITH CAD

[L CC (1 of 2)]
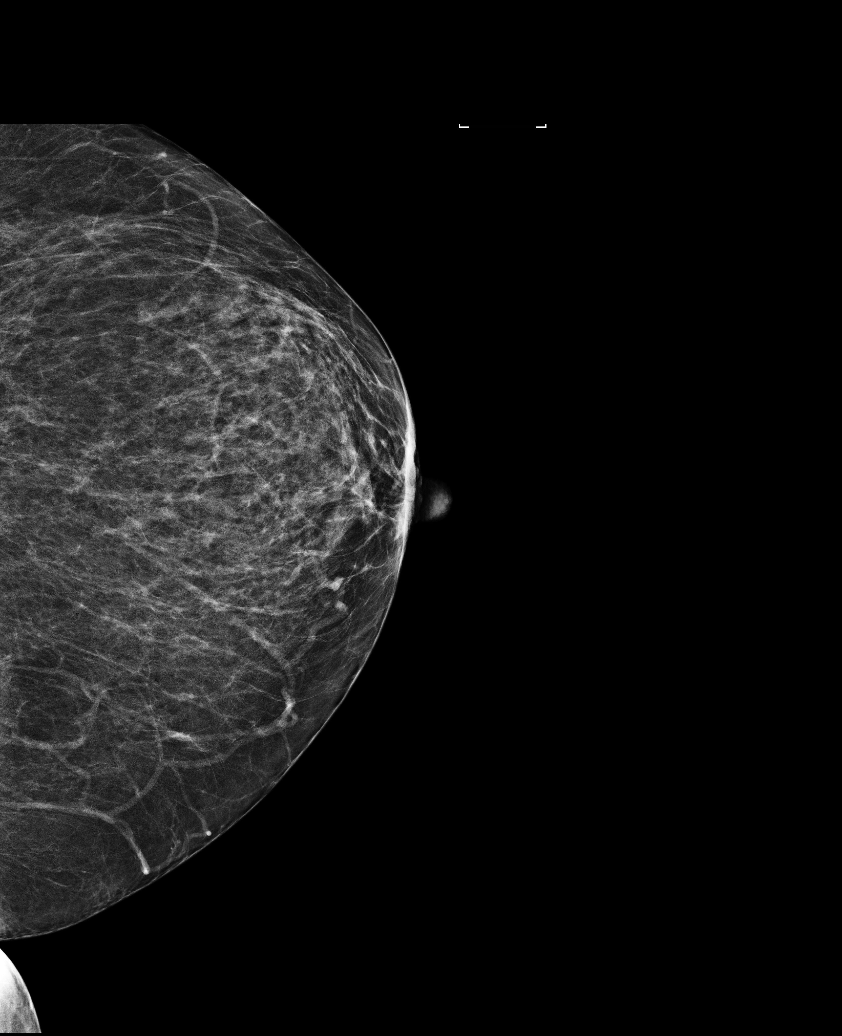

[R CC (1 of 2)]
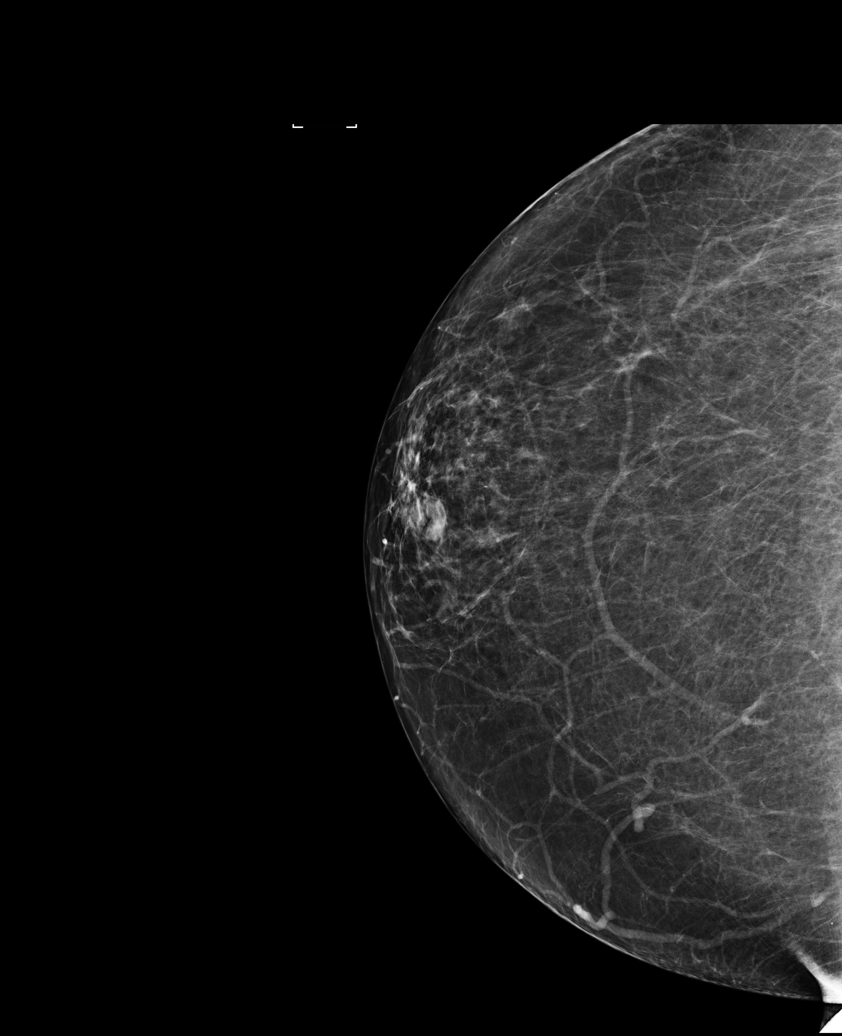

[L MLO]
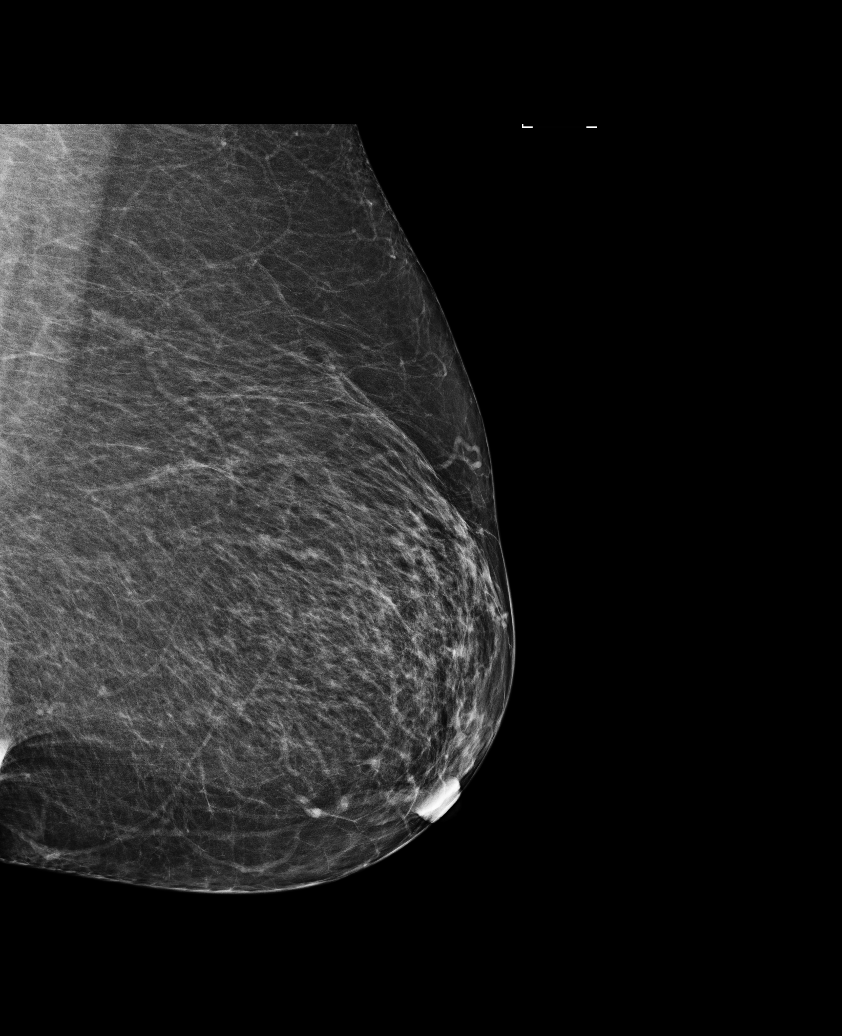

[R CC (2 of 2)]
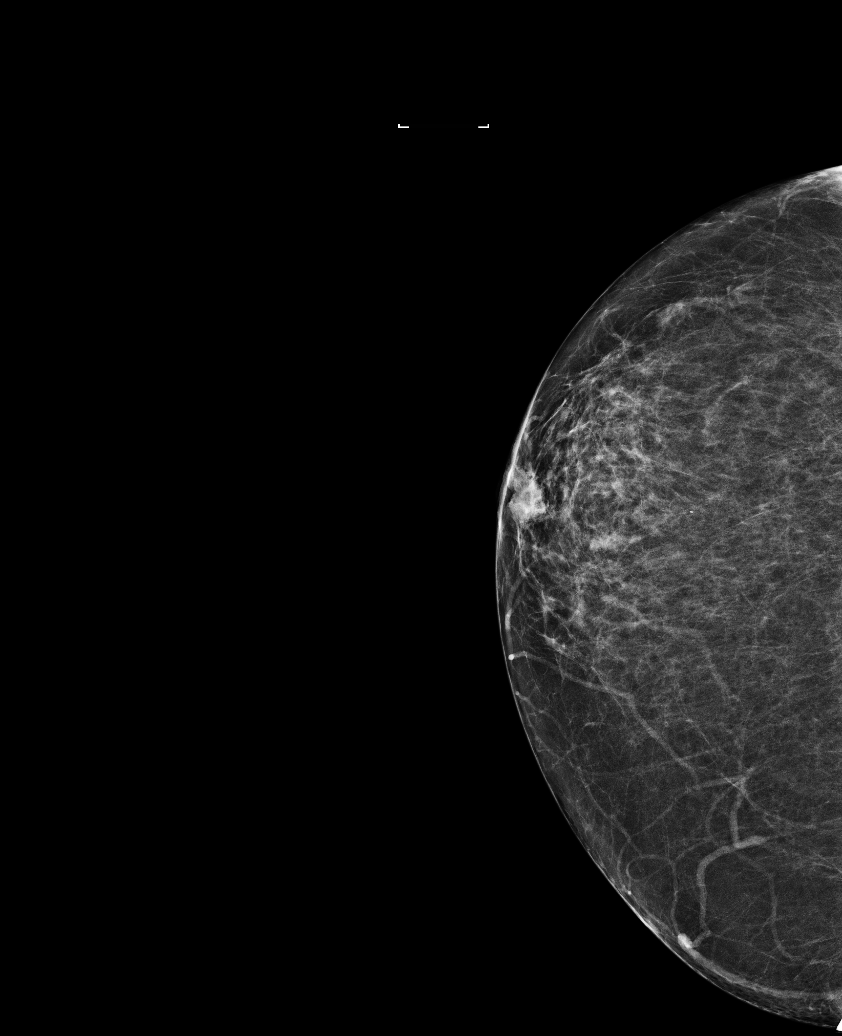

[R MLO]
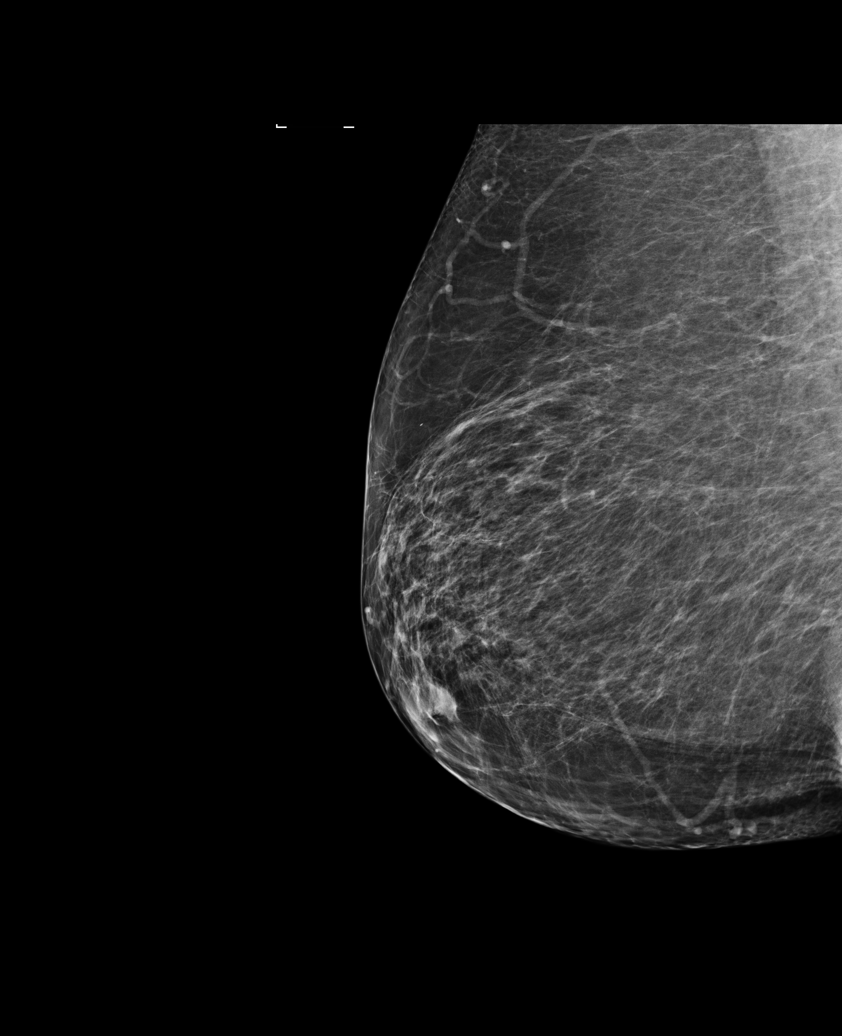

[L CC (2 of 2)]
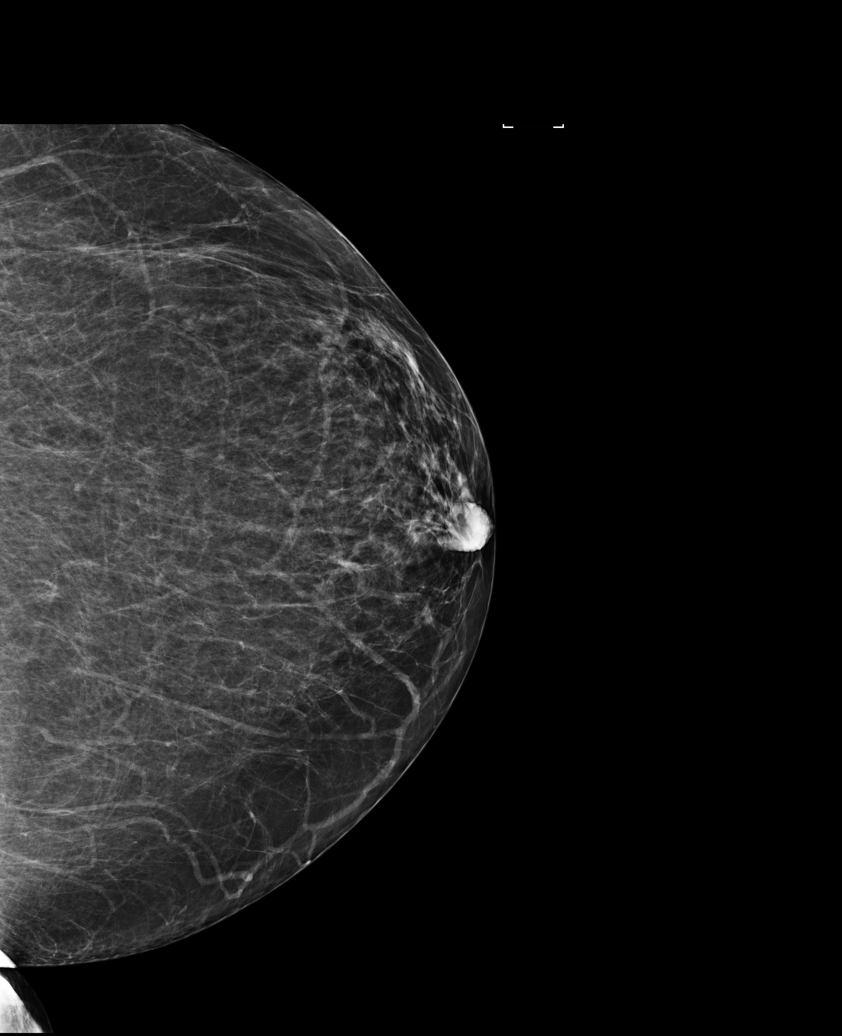

[6 of 6 positions shown; findings below may reference images not displayed]

ACR Breast Density Category b: There are scattered areas of
fibroglandular density.
FINDINGS: There are no findings suspicious for malignancy. Images were
processed with CAD.
IMPRESSION: No mammographic evidence of malignancy. A result letter of this
screening mammogram will be mailed directly to the patient.

RECOMMENDATION:
Screening mammogram in one year. (Code:[US])

BI-RADS CATEGORY  1: Negative.

## 2018-12-05 ENCOUNTER — Other Ambulatory Visit (HOSPITAL_COMMUNITY)
Admission: RE | Admit: 2018-12-05 | Discharge: 2018-12-05 | Disposition: A | Discharge status: Home / Self Care | Payer: 59 | Source: Ambulatory Visit | Attending: Internal Medicine | Admitting: Internal Medicine

## 2018-12-05 DIAGNOSIS — Z01419 Encounter for gynecological examination (general) (routine) without abnormal findings: Secondary | ICD-10-CM | POA: Insufficient documentation

## 2018-12-13 LAB — CYTOLOGY - PAP
Comment: NEGATIVE
Diagnosis: NEGATIVE
High risk HPV: NEGATIVE

## 2018-12-25 ENCOUNTER — Other Ambulatory Visit: Payer: Self-pay | Admitting: Infectious Diseases

## 2018-12-25 DIAGNOSIS — B20 Human immunodeficiency virus [HIV] disease: Secondary | ICD-10-CM

## 2019-01-05 ENCOUNTER — Other Ambulatory Visit: Payer: Self-pay

## 2019-01-05 ENCOUNTER — Ambulatory Visit: Payer: 59 | Admitting: Infectious Diseases

## 2019-01-05 ENCOUNTER — Encounter: Payer: Self-pay | Admitting: Infectious Diseases

## 2019-01-05 ENCOUNTER — Ambulatory Visit (INDEPENDENT_AMBULATORY_CARE_PROVIDER_SITE_OTHER): Payer: 59 | Admitting: Infectious Diseases

## 2019-01-05 DIAGNOSIS — E7849 Other hyperlipidemia: Secondary | ICD-10-CM | POA: Diagnosis not present

## 2019-01-05 DIAGNOSIS — I10 Essential (primary) hypertension: Secondary | ICD-10-CM | POA: Diagnosis not present

## 2019-01-05 DIAGNOSIS — G4709 Other insomnia: Secondary | ICD-10-CM

## 2019-01-05 DIAGNOSIS — R87612 Low grade squamous intraepithelial lesion on cytologic smear of cervix (LGSIL): Secondary | ICD-10-CM

## 2019-01-05 DIAGNOSIS — B20 Human immunodeficiency virus [HIV] disease: Secondary | ICD-10-CM | POA: Diagnosis not present

## 2019-01-05 MED ORDER — CLOBETASOL PROPIONATE 0.05 % EX SOLN: 1.0000 "application " | Freq: Two times a day (BID) | CUTANEOUS | 3 refills | Status: DC

## 2019-01-05 MED ORDER — TEMAZEPAM 15 MG PO CAPS: ORAL_CAPSULE | ORAL | 0 refills | Status: DC

## 2019-01-05 MED ORDER — ODEFSEY 200-25-25 MG PO TABS: 1.0000 | ORAL_TABLET | Freq: Every day | ORAL | 3 refills | Status: DC

## 2019-01-05 NOTE — Progress Notes (Signed)
Name: Gail Edwards MCN:[id number]  DOB: 1955-05-12  PCP: Lorenda Ishihara, MD    Virtual Visit via Telephone Note  I connected with Gail Edwards on 01/05/19 at  8:45 AM EST by telephone and verified that I am speaking with the correct person using two identifiers.   I discussed the limitations, risks, security and privacy concerns of performing an evaluation and management service by telephone and the availability of in person appointments. I also discussed with the patient that there may be a patient responsible charge related to this service. The patient expressed understanding and agreed to proceed.  CC: HIV follow up care. No concerns or complaints today.     History of Present Illness: Gail Edwards is a 63 y.o. female with very well controlled HIV disease on Odefsey with viral load in September 2020 < 20 / CD4 697.   She has been doing very well and no significant illnesses since last office visit with Dr. Ninetta Edwards. She is in care with a primary care provider and attends physicals annually. She had a pap smear in October of this year that yielded normal cytology and negative for HPV. Mammogram is up to date. She has received her flu shot.   Biggest thing for her is the adjustment back to working from home. 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   She has a history of requiring Metformin in the past but is off of it now in the setting of significant weight loss. She is eating well and sleeping well. No unexpected weight changes.   Medical/surgical/social/family history have been updated during today's visit.     Observations/Objective: Roman is in good spirits on the phone today. She is pleasant. There is no concern for shortness of breath.         Assessment and Plan: Problem List Items Addressed This Visit      Unprioritized   Human immunodeficiency virus (HIV) disease (HCC) (Chronic)    She has had long term excellent control. She is taking her Odefsey correctly and no  drug interactions observed. Will refill x 12 months with 90day supply request from patient. She will call 8m before her next appointment to request Rx for labs from Dr. Ninetta Edwards. She is very interested in video visit next time.       Relevant Medications   emtricitabine-rilpivir-tenofovir AF (ODEFSEY) 200-25-25 MG TABS tablet   clobetasol (TEMOVATE) 0.05 % external solution   HTN (hypertension) (Chronic)    Reports to be under very good control. Managed by her PCP       PAP SMER CERV W/LW GRADE SQUAMOUS INTRAEPITH LES    Normal pap consecutively x 3 occurrences. Normal pelvic exam. Normal bimanual exam.  Discussed recommended screening interval for women living with HIV disease meant to be lifelong and at an interval of Q1-3 years - given the above findings she can safely change to q30yr interval. She will discuss with her PCP regarding recommendations.        Hyperlipidemia    Blood sugar on fasting labs 117. She is working with her PCP on this. She had a hgb A1C recently through wellness program at work and reported to be not in the diabetic range.           Follow Up Instructions: Follow up labs in 1 year with video visit scheduled November 2021 with Dr. Ninetta Edwards.    I discussed the assessment and treatment plan with the patient. The patient was provided an opportunity to ask  questions and all were answered. The patient agreed with the plan and demonstrated an understanding of the instructions.   The patient was advised to call back or seek an in-person evaluation if the symptoms worsen or if the condition fails to improve as anticipated.  I provided 13 minutes of non-face-to-face time during this encounter.   Janene Madeira, MSN, NP-C [facility] [email or web address] Pager: [phone] Office: [phone] [place]: [phone]

## 2019-01-05 NOTE — Assessment & Plan Note (Signed)
She has had long term excellent control. She is taking her Odefsey correctly and no drug interactions observed. Will refill x 12 months with 90day supply request from patient. She will call 6m before her next appointment to request Rx for labs from Dr. Johnnye Sima. She is very interested in video visit next time.

## 2019-01-05 NOTE — Patient Instructions (Signed)
It was very nice to talk to you on the phone today.  I am glad to hear that you are staying well and keeping healthy.    Your medication is working perfectly for you and you are doing a great job taking it as prescribed  Please continue your [place] with food everyday.   Vaccines Recommended:   Annual flu shot  Pneumonia booster at 47 years  Recommended Next Office Visit:   1 year - scheduled for video visit. Please call for prescription to get labs checked prior to your viist.   Please continue to be well, stay away from folks that are sick, wash her hands frequently, do not touch her face or mouth and continue to take your medications.

## 2019-01-05 NOTE — Assessment & Plan Note (Signed)
Normal pap consecutively x 3 occurrences. Normal pelvic exam. Normal bimanual exam.  Discussed recommended screening interval for women living with HIV disease meant to be lifelong and at an interval of Q1-3 years - given the above findings she can safely change to q56yr interval. She will discuss with her PCP regarding recommendations.

## 2019-01-05 NOTE — Assessment & Plan Note (Signed)
Reports to be under very good control. Managed by her PCP

## 2019-01-05 NOTE — Addendum Note (Signed)
Addended by: Mauricio Po D on: 01/05/2019 10:31 AM   Modules accepted: Orders

## 2019-01-05 NOTE — Assessment & Plan Note (Signed)
Blood sugar on fasting labs 117. She is working with her PCP on this. She had a hgb A1C recently through wellness program at work and reported to be not in the diabetic range.

## 2019-02-23 DIAGNOSIS — S7292XA Unspecified fracture of left femur, initial encounter for closed fracture: Secondary | ICD-10-CM

## 2019-02-23 HISTORY — DX: Unspecified fracture of left femur, initial encounter for closed fracture: S72.92XA

## 2019-04-06 ENCOUNTER — Other Ambulatory Visit: Payer: Self-pay

## 2019-04-06 DIAGNOSIS — G4709 Other insomnia: Secondary | ICD-10-CM

## 2019-04-06 MED ORDER — TEMAZEPAM 15 MG PO CAPS
ORAL_CAPSULE | ORAL | 0 refills | Status: DC
Start: 2019-04-06 — End: 2019-04-12

## 2019-04-12 ENCOUNTER — Other Ambulatory Visit: Payer: Self-pay | Admitting: Infectious Diseases

## 2019-04-12 DIAGNOSIS — G4709 Other insomnia: Secondary | ICD-10-CM

## 2019-04-12 MED ORDER — TEMAZEPAM 15 MG PO CAPS: ORAL_CAPSULE | ORAL | 0 refills | Status: DC

## 2019-05-03 ENCOUNTER — Ambulatory Visit: Payer: 59 | Attending: Internal Medicine

## 2019-05-03 DIAGNOSIS — Z23 Encounter for immunization: Secondary | ICD-10-CM

## 2019-05-03 NOTE — Progress Notes (Signed)
   Covid-19 Vaccination Clinic  Name:  Gail Edwards    MRN: [id number] DOB: 1955/07/07  05/03/2019  Gail Edwards was observed post Covid-19 immunization for 15 minutes without incident. She was provided with Vaccine Information Sheet and instruction to access the V-Safe system.   Gail Edwards was instructed to call 911 with any severe reactions post vaccine: Marland Kitchen Difficulty breathing  . Swelling of face and throat  . A fast heartbeat  . A bad rash all over body  . Dizziness and weakness   Immunizations Administered    Name Date Dose VIS Date Route   Pfizer COVID-19 Vaccine 05/03/2019  8:32 AM 0.3 mL 02/02/2019 Intramuscular   Manufacturer: [employer], [employer]   Lot: [id number]   NDC: [address]-7

## 2019-05-28 ENCOUNTER — Ambulatory Visit: Payer: 59 | Attending: Internal Medicine

## 2019-05-28 DIAGNOSIS — Z23 Encounter for immunization: Secondary | ICD-10-CM

## 2019-05-28 NOTE — Progress Notes (Signed)
   Covid-19 Vaccination Clinic  Name:  Gail Edwards    MRN: [id number] DOB: 1956-02-17  05/28/2019  [name] was observed post Covid-19 immunization for 15 minutes without incident. She was provided with Vaccine Information Sheet and instruction to access the V-Safe system.   [name] was instructed to call 911 with any severe reactions post vaccine: Marland Kitchen Difficulty breathing  . Swelling of face and throat  . A fast heartbeat  . A bad rash all over body  . Dizziness and weakness   Immunizations Administered    Name Date Dose VIS Date Route   Pfizer COVID-19 Vaccine 05/28/2019  8:36 AM 0.3 mL 02/02/2019 Intramuscular   Manufacturer: [employer], [employer]   Lot: [id number]   NDC: [address]-7

## 2019-06-26 ENCOUNTER — Telehealth: Payer: Self-pay | Admitting: Infectious Diseases

## 2019-06-26 ENCOUNTER — Other Ambulatory Visit: Payer: Self-pay | Admitting: Infectious Diseases

## 2019-06-26 DIAGNOSIS — G4709 Other insomnia: Secondary | ICD-10-CM

## 2019-06-26 MED ORDER — TEMAZEPAM 15 MG PO CAPS: ORAL_CAPSULE | ORAL | 0 refills | Status: DC

## 2019-06-26 NOTE — Telephone Encounter (Signed)
Done, thanks

## 2019-06-26 NOTE — Telephone Encounter (Signed)
Dr. Ninetta Lights, the patient is requesting a refill for her Restoril rx.  Please authorize.

## 2019-07-25 ENCOUNTER — Other Ambulatory Visit: Payer: Self-pay

## 2019-07-25 DIAGNOSIS — G4709 Other insomnia: Secondary | ICD-10-CM

## 2019-07-25 MED ORDER — TEMAZEPAM 15 MG PO CAPS: ORAL_CAPSULE | ORAL | 0 refills | Status: DC

## 2019-07-25 NOTE — Progress Notes (Signed)
Called medication in for patient.  P: [phone] Lorenso Courier, CMA

## 2019-08-16 ENCOUNTER — Other Ambulatory Visit: Payer: Self-pay

## 2019-08-16 ENCOUNTER — Encounter (HOSPITAL_COMMUNITY): Payer: Self-pay | Admitting: Emergency Medicine

## 2019-08-16 ENCOUNTER — Emergency Department (HOSPITAL_COMMUNITY): Payer: 59

## 2019-08-16 ENCOUNTER — Emergency Department (HOSPITAL_COMMUNITY)
Admission: EM | Admit: 2019-08-16 | Discharge: 2019-08-16 | Disposition: A | Discharge status: Home / Self Care | Payer: 59 | Attending: Emergency Medicine | Admitting: Emergency Medicine

## 2019-08-16 DIAGNOSIS — W010XXA Fall on same level from slipping, tripping and stumbling without subsequent striking against object, initial encounter: Secondary | ICD-10-CM | POA: Diagnosis not present

## 2019-08-16 DIAGNOSIS — S00501A Unspecified superficial injury of lip, initial encounter: Secondary | ICD-10-CM | POA: Insufficient documentation

## 2019-08-16 DIAGNOSIS — S20211A Contusion of right front wall of thorax, initial encounter: Secondary | ICD-10-CM | POA: Diagnosis not present

## 2019-08-16 DIAGNOSIS — S8992XA Unspecified injury of left lower leg, initial encounter: Secondary | ICD-10-CM | POA: Diagnosis present

## 2019-08-16 DIAGNOSIS — Y999 Unspecified external cause status: Secondary | ICD-10-CM | POA: Diagnosis not present

## 2019-08-16 DIAGNOSIS — S00531A Contusion of lip, initial encounter: Secondary | ICD-10-CM | POA: Diagnosis not present

## 2019-08-16 DIAGNOSIS — B20 Human immunodeficiency virus [HIV] disease: Secondary | ICD-10-CM | POA: Diagnosis not present

## 2019-08-16 DIAGNOSIS — S72402A Unspecified fracture of lower end of left femur, initial encounter for closed fracture: Secondary | ICD-10-CM | POA: Insufficient documentation

## 2019-08-16 DIAGNOSIS — Y92009 Unspecified place in unspecified non-institutional (private) residence as the place of occurrence of the external cause: Secondary | ICD-10-CM | POA: Insufficient documentation

## 2019-08-16 DIAGNOSIS — R7303 Prediabetes: Secondary | ICD-10-CM | POA: Insufficient documentation

## 2019-08-16 DIAGNOSIS — I10 Essential (primary) hypertension: Secondary | ICD-10-CM | POA: Insufficient documentation

## 2019-08-16 DIAGNOSIS — Y939 Activity, unspecified: Secondary | ICD-10-CM | POA: Insufficient documentation

## 2019-08-16 DIAGNOSIS — W19XXXA Unspecified fall, initial encounter: Secondary | ICD-10-CM

## 2019-08-16 DIAGNOSIS — Z7984 Long term (current) use of oral hypoglycemic drugs: Secondary | ICD-10-CM | POA: Diagnosis not present

## 2019-08-16 DIAGNOSIS — E785 Hyperlipidemia, unspecified: Secondary | ICD-10-CM | POA: Insufficient documentation

## 2019-08-16 IMAGING — CR DG CHEST 2V
2 series · 2 of 2 positions shown · non-contrast
Comparison: None.

CLINICAL DATA: LEFT femur pain

EXAM:
CHEST - 2 VIEW

[w chest lat]
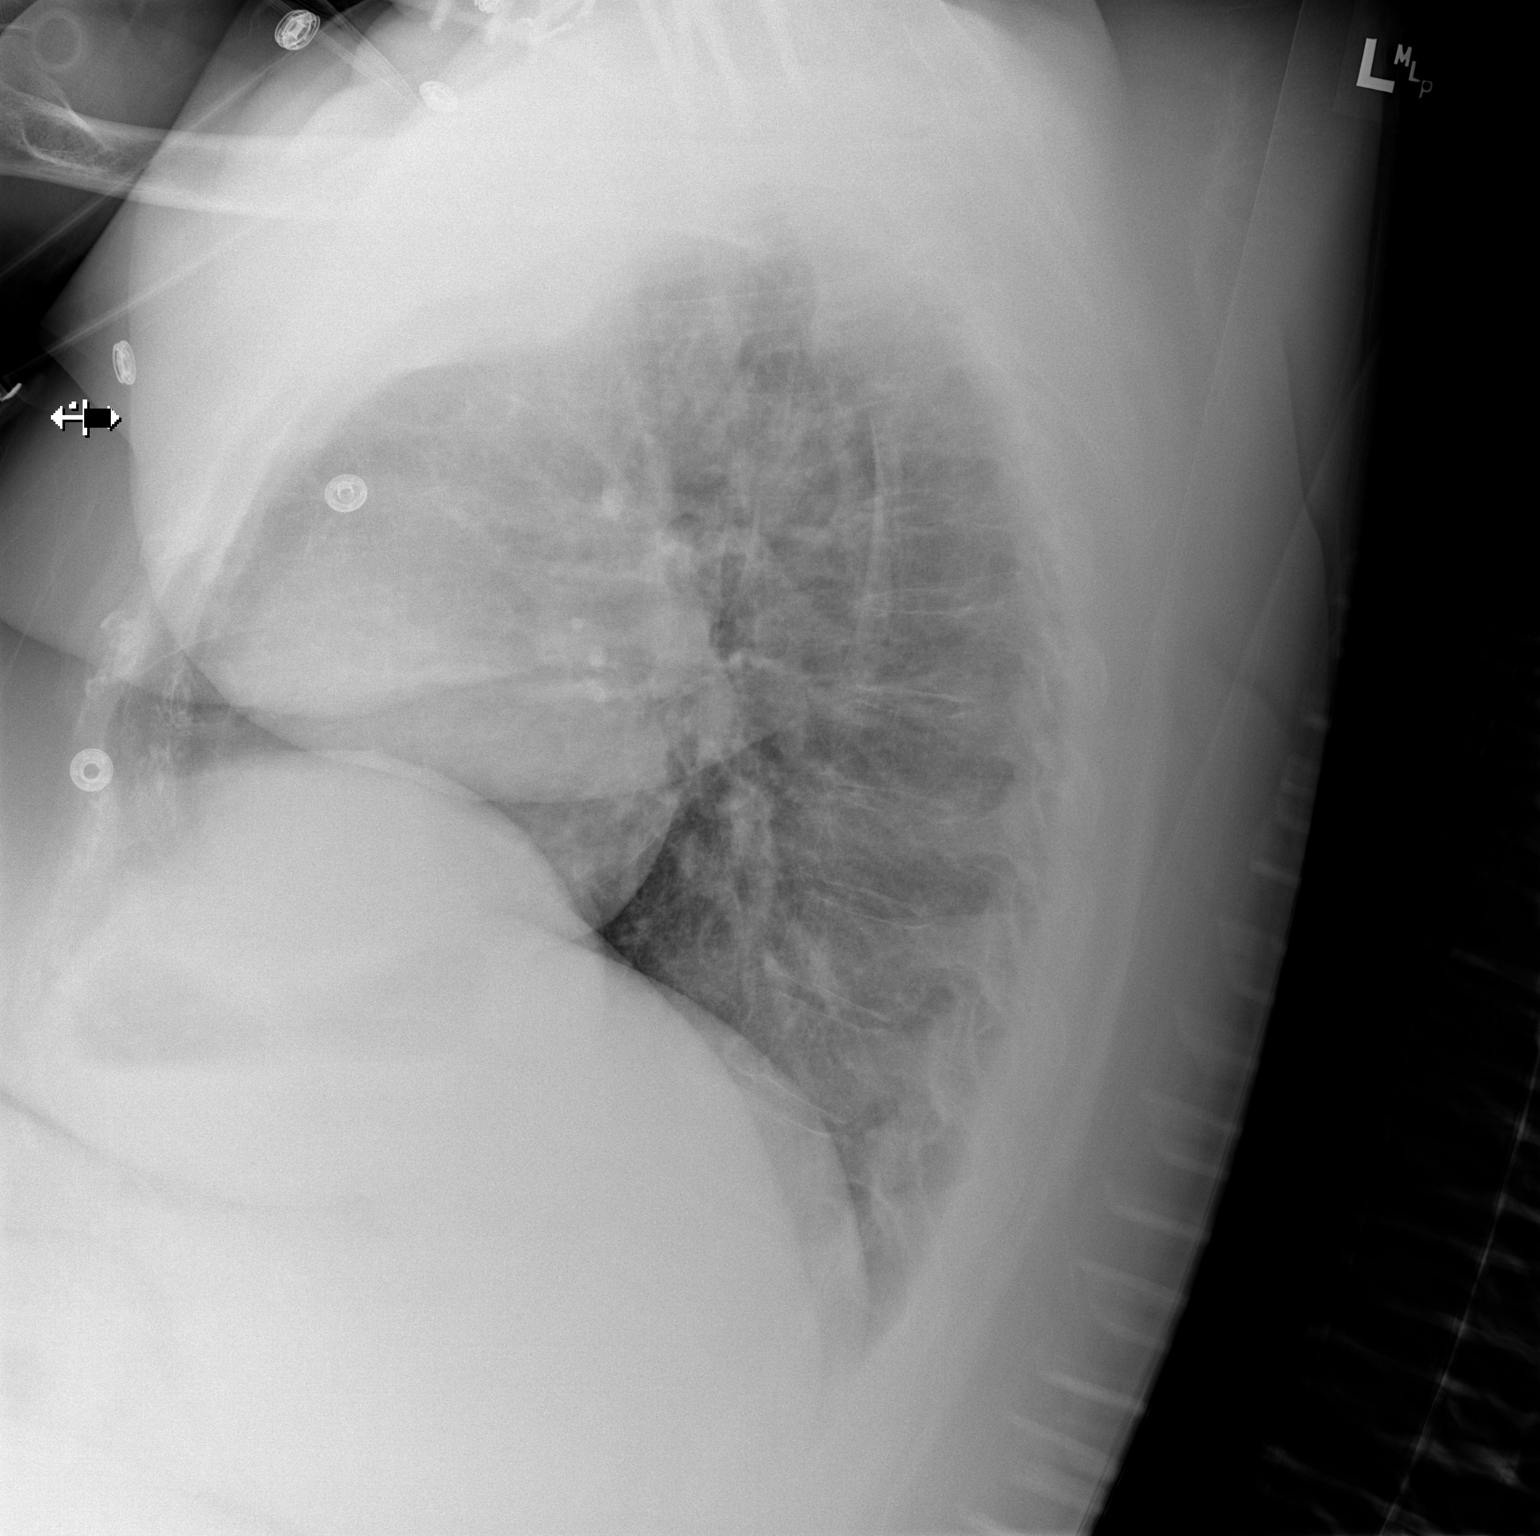

[x chest ap]
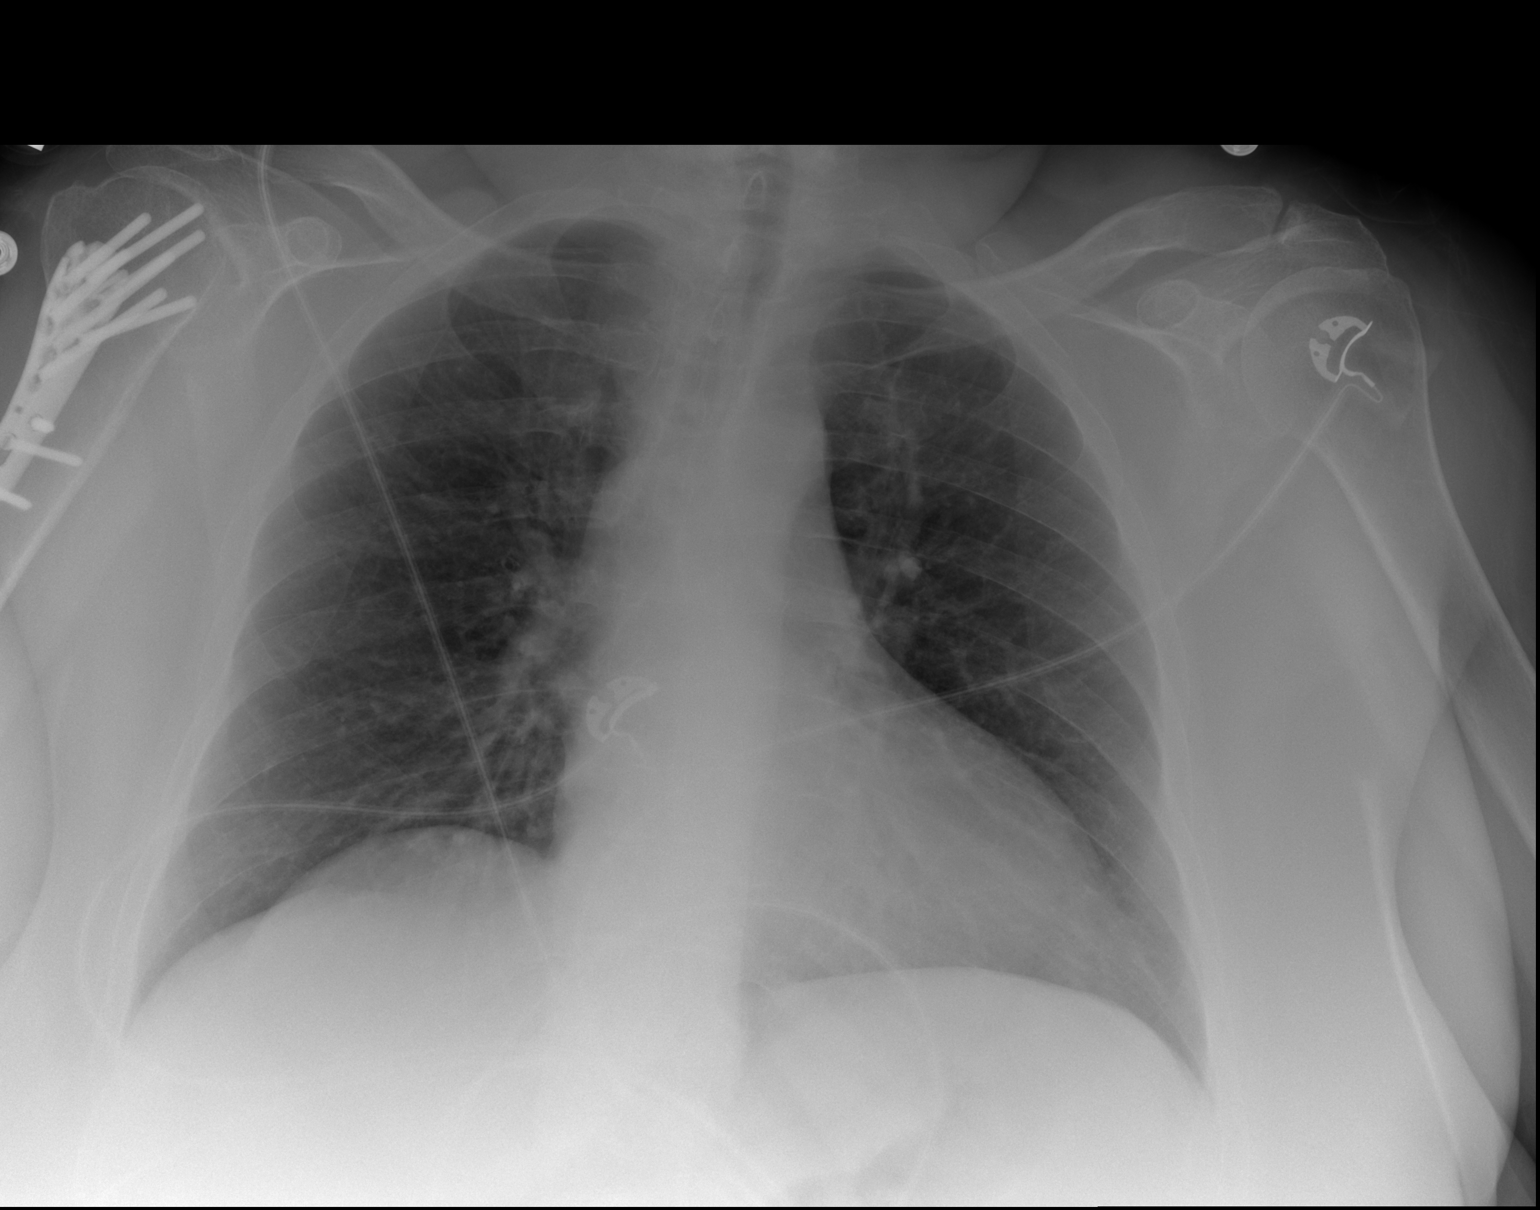

[2 of 2 positions shown; findings below may reference images not displayed]

FINDINGS: Normal mediastinum and cardiac silhouette. Normal pulmonary
vasculature. No evidence of effusion, infiltrate, or pneumothorax.
No acute bony abnormality. Insert fixation of RIGHT humerus
IMPRESSION: No active cardiopulmonary disease.

## 2019-08-16 IMAGING — CR DG KNEE 1-2V PORT*L*
2 series · 2 of 2 positions shown · non-contrast
Comparison: No prior.

CLINICAL DATA: Fall.

EXAM:
PORTABLE LEFT KNEE - 1-2 VIEW

[x knee lat left]
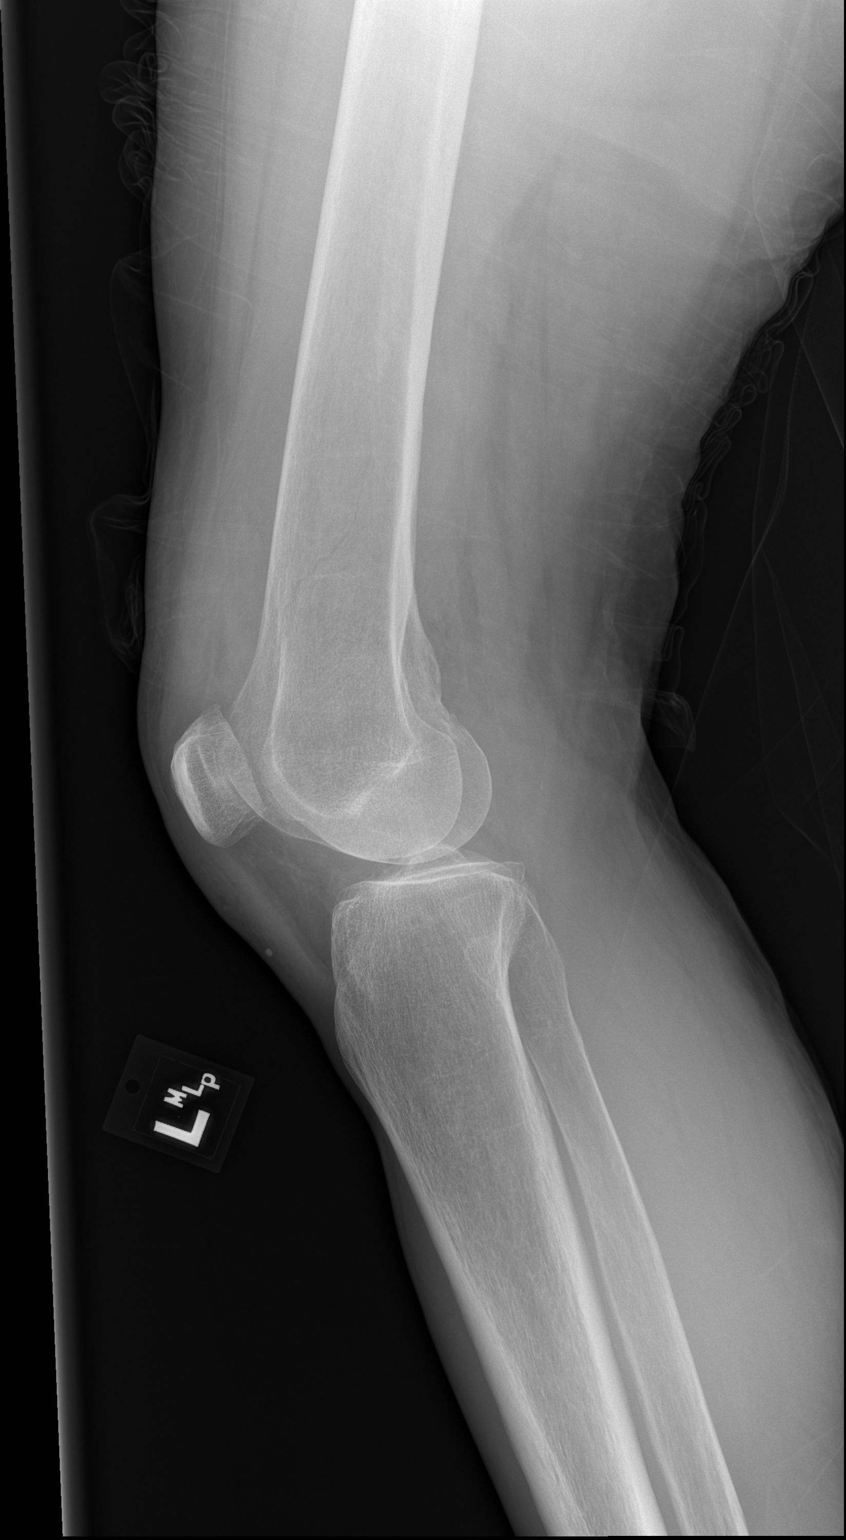

[x knee ap left]
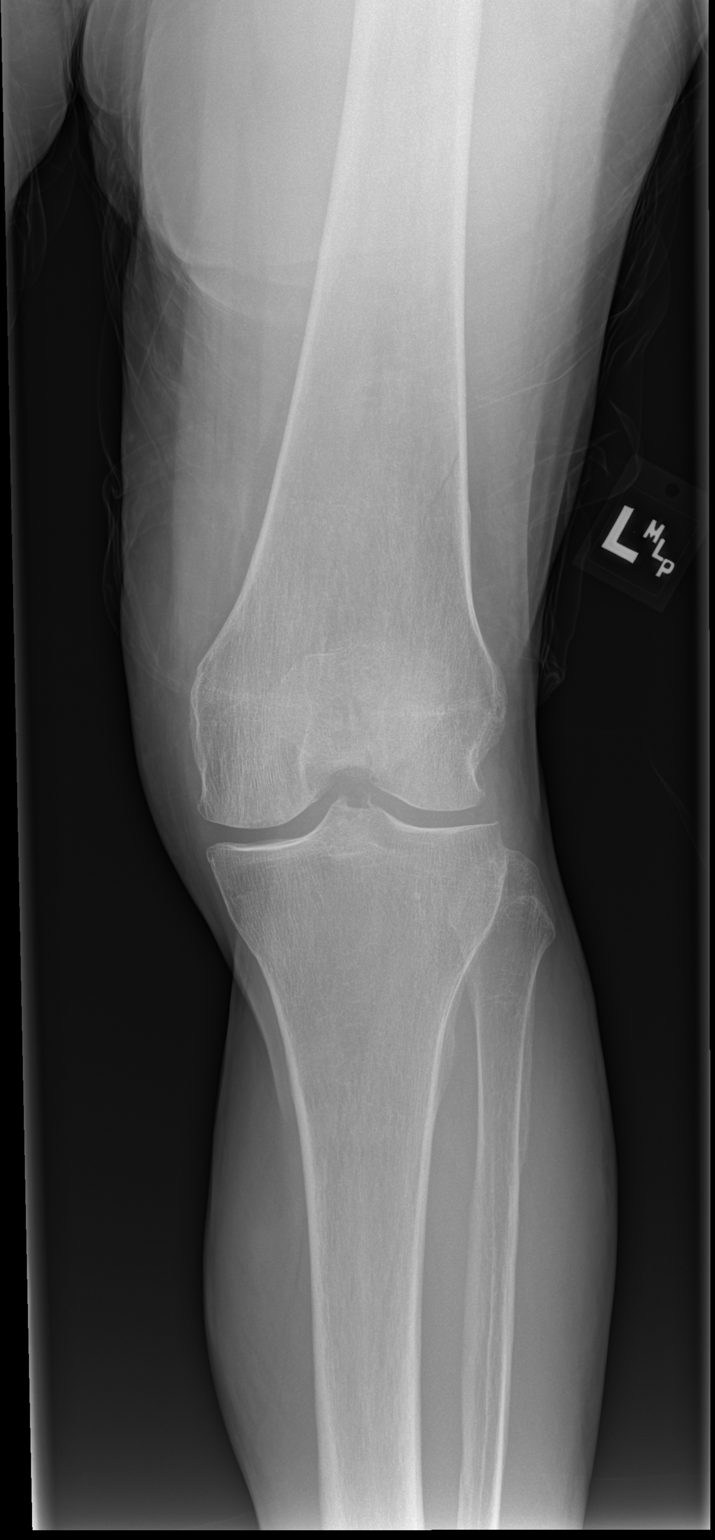

[2 of 2 positions shown; findings below may reference images not displayed]

FINDINGS: Nondisplaced fracture of the distal left femoral metaphysis noted.
Tibia is intact. Mild tricompartment degenerative change.
IMPRESSION: Nondisplaced fracture of the distal left femoral metaphysis.

## 2019-08-16 IMAGING — CR DG FEMUR 2+V*L*
5 series · 5 of 5 positions shown · non-contrast
Comparison: None.

CLINICAL DATA: Fall.  Pain.

EXAM:
LEFT FEMUR 2 VIEWS

[x femur proximal ap left]
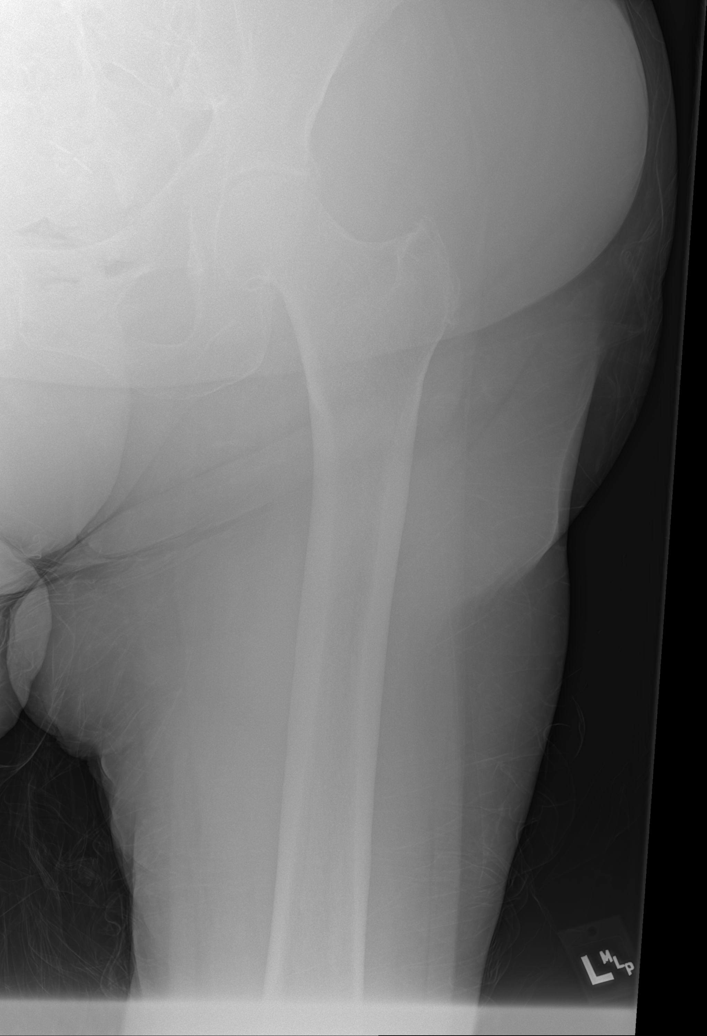

[x femur distal ap left]
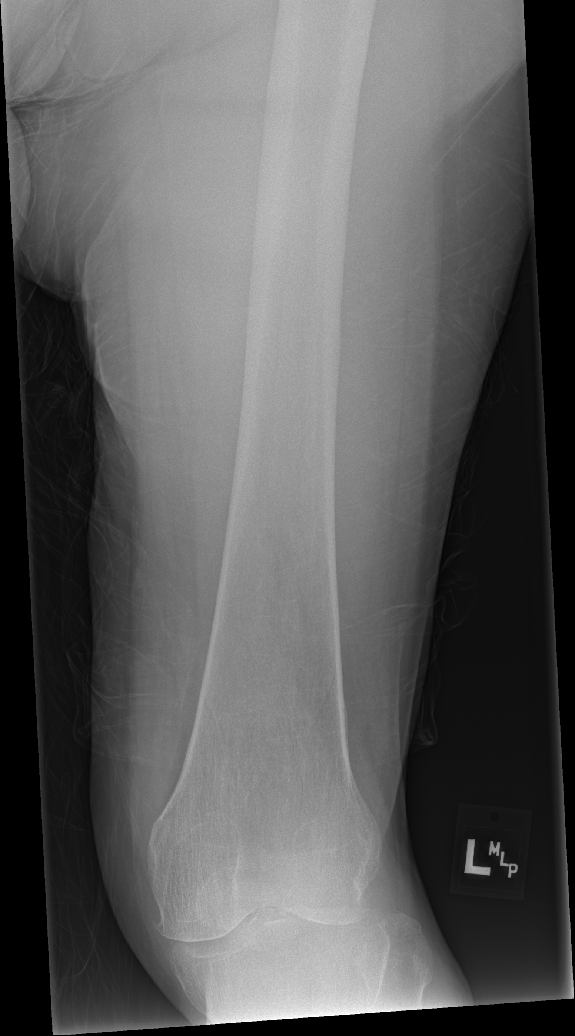

[w hip lat left (1 of 2)]
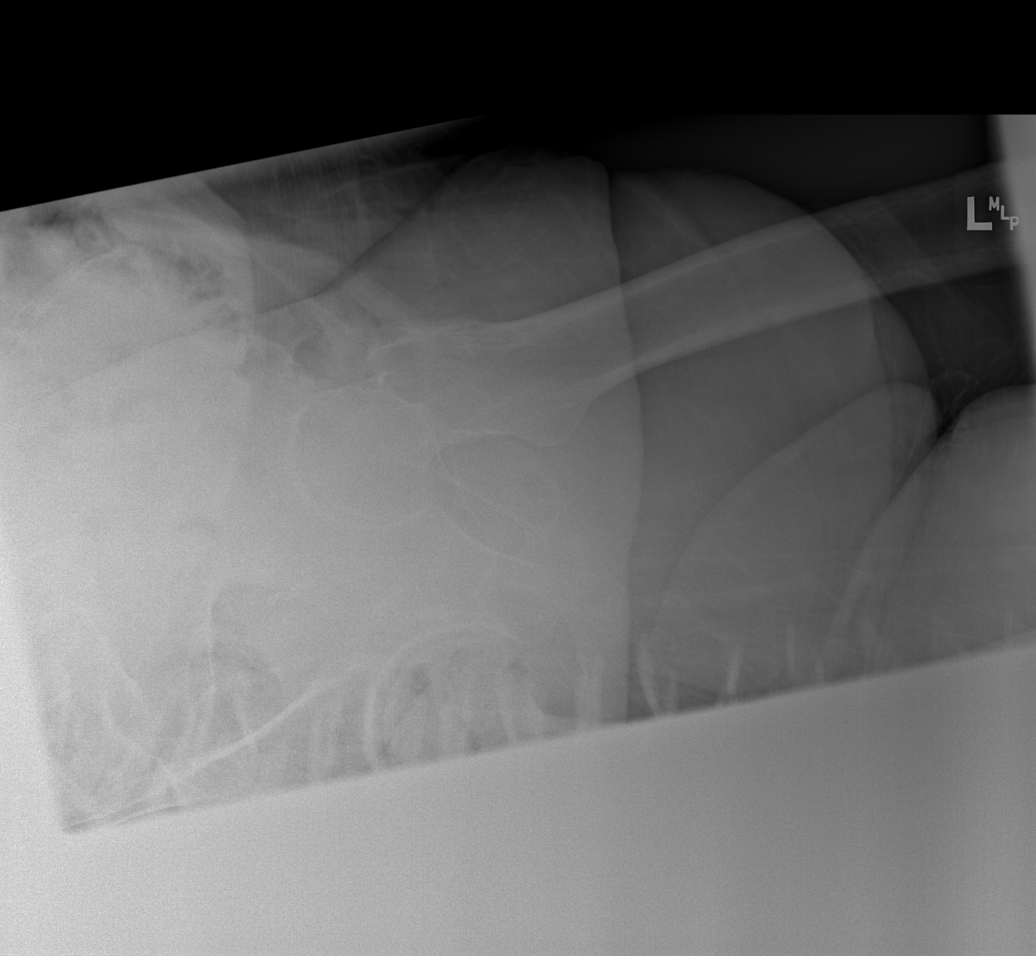

[w hip lat left (2 of 2)]
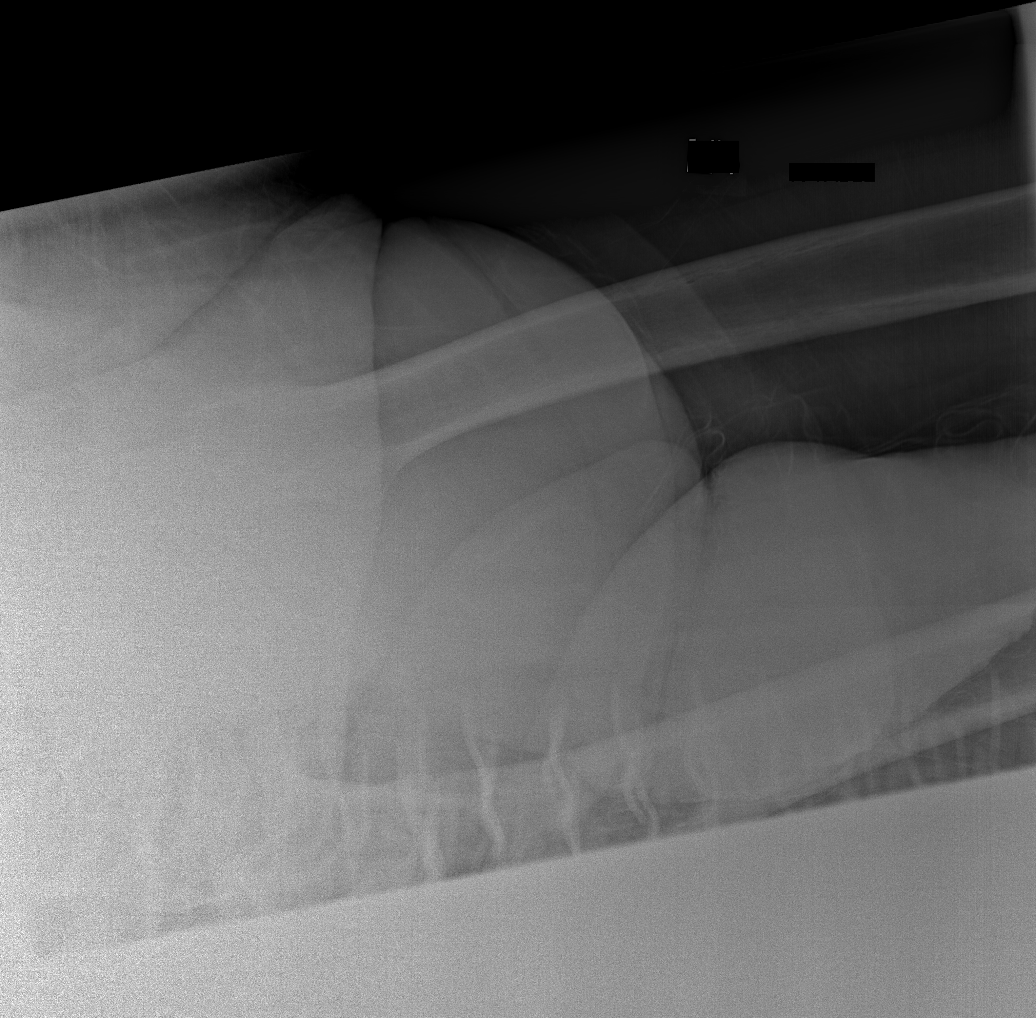

[x femur distal lat left]
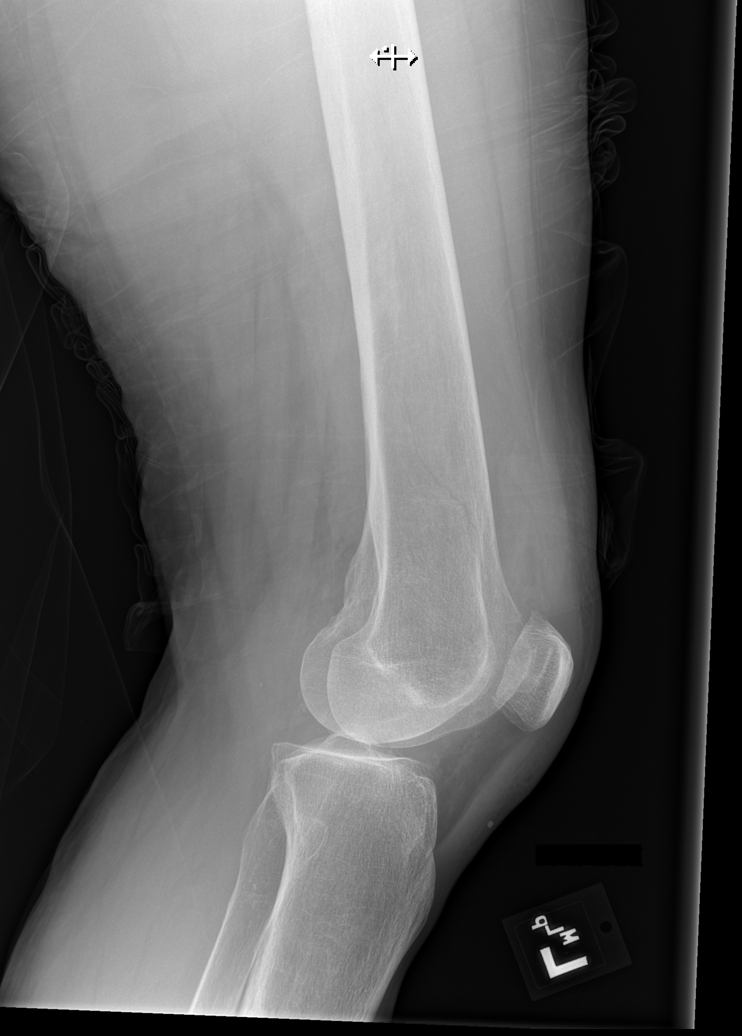

[5 of 5 positions shown; findings below may reference images not displayed]

FINDINGS: Two view exam of the left femur shows a nondisplaced fracture of the
distal metaphysis. Fracture line is quite subtle in this osteopenic
patient and distal extent of the fracture cannot be determined so as
to exclude intra-articular extension. Lateral view limited by
overlying clothing, but there may be a joint effusion in the
suprapatellar bursa. Mild tricompartmental degenerative changes
evident.
IMPRESSION: 1. Nondisplaced distal femur fracture. Distal extent of the fracture
cannot be determined due to osteopenia and overlying clothing. CT or
MRI could be used to further evaluate as clinically warranted.
2. Probable joint effusion in the suprapatellar bursa.

## 2019-08-16 MED ORDER — OXYCODONE-ACETAMINOPHEN 5-325 MG PO TABS: 2.0000 | ORAL_TABLET | Freq: Four times a day (QID) | ORAL | 0 refills | Status: DC | PRN

## 2019-08-16 MED ORDER — HYDROCODONE-ACETAMINOPHEN 5-325 MG PO TABS
2.0000 | ORAL_TABLET | Freq: Once | ORAL | Status: AC
  Administered 2019-08-16: 2 via ORAL
  Filled 2019-08-16: qty 2

## 2019-08-16 MED ORDER — OXYCODONE-ACETAMINOPHEN 5-325 MG PO TABS
2.0000 | ORAL_TABLET | Freq: Once | ORAL | Status: AC
  Administered 2019-08-16: 2 via ORAL
  Filled 2019-08-16: qty 2

## 2019-08-16 NOTE — Progress Notes (Signed)
TOC CM spoke to pt and offered choice for [facility]. Pt agreeable to [facility]. Contacted rep, Amy with new referral. Contacted Zach, with [employer] to deliver RW to ED room. Pt states her son will pick her up after dc and assist her at home. She has a handicap apartment. Pt declined 3n1 bedside commode. Isidoro Donning RN CCM, [facility] ED TOC CM [phone]

## 2019-08-16 NOTE — ED Triage Notes (Signed)
Per EMS- Patient is from home. Patient fell with injury to her left knee and right lower lip. No LOC. Patient is not on blood thinners. Patient denies neck or back pain

## 2019-08-16 NOTE — Progress Notes (Signed)
Orthopedic Tech Progress Note Patient Details:  Gail Edwards 09/25/1955 [id number]  Ortho Devices Type of Ortho Device: Knee Immobilizer Ortho Device/Splint Location: left Ortho Device/Splint Interventions: Application   Post Interventions Patient Tolerated: Well Instructions Provided: Care of device   Saul Fordyce 08/16/2019, 11:09 AM

## 2019-08-16 NOTE — ED Provider Notes (Signed)
[facility]-EMERGENCY DEPT Provider Note   CSN: [id number] Arrival date & time: 08/16/19  0800     History Chief Complaint  Patient presents with  . Fall  . Knee Injury  . lip injury    Gail Edwards is a 64 y.o. female.  64 year old female with past medical history below including HIV, hypertension, prediabetes, psoriasis who presents with fall.  Patient was at home this morning when she tripped and fell forward, landing on her left knee and striking her right anterior chest wall.  She sustained a small injury to her lower lip but denies any significant head injury and no loss of consciousness.  She was able to walk a few steps but this was very painful.  No neck or back pain.  No breathing problems.  She denies any anticoagulant use.  Knee pain is mild when still, gets much worse with any movement.  The history is provided by the patient.  Fall       Past Medical History:  Diagnosis Date  . Abnormal Pap smear 2009   lsil-cin1  . HIV infection (HCC)   . Pre-diabetes     Patient Active Problem List   Diagnosis Date Noted  . Alopecia 01/04/2018  . Bruising 01/04/2018  . Hyperlipidemia 12/06/2012  . Elevated blood sugar 05/29/2012  . Insomnia 05/29/2012  . HTN (hypertension) 11/25/2011  . PAP SMER CERV W/LW GRADE SQUAMOUS INTRAEPITH LES 06/20/2007  . PSORIASIS NEC 11/28/2006  . BACK PAIN 11/25/2005  . Human immunodeficiency virus (HIV) disease (HCC) 11/24/2005    Past Surgical History:  Procedure Laterality Date  . CESAREAN SECTION    . CHOLECYSTECTOMY    . HERNIA REPAIR    . ORIF HUMERUS FRACTURE Right 12/17/2016   Procedure: OPEN REDUCTION INTERNAL FIXATION (ORIF) RIGHT PROXIMAL HUMERUS FRACTURE;  Surgeon: Sheral Apley, MD;  Location: [facility];  Service: Orthopedics;  Laterality: Right;     OB History    Gravida  2   Para  1   Term  1   Preterm      AB  1   Living  1     SAB  1   TAB      Ectopic       Multiple      Live Births              Family History  Problem Relation Age of Onset  . Diabetes Mother   . Cancer Father        brain  . Breast cancer Neg Hx     Social History   Tobacco Use  . Smoking status: Never Smoker  . Smokeless tobacco: Never Used  Vaping Use  . Vaping Use: Never used  Substance Use Topics  . Alcohol use: No  . Drug use: No    Home Medications Prior to Admission medications   Medication Sig Start Date End Date Taking? Authorizing Provider  calcium carbonate (TUMS) 500 MG chewable tablet Chew 1,500 mg by mouth daily.   Yes [provider]  cholecalciferol (VITAMIN D3) 25 MCG (1000 UNIT) tablet Take 2,000 Units by mouth daily.   Yes [provider]  clobetasol (TEMOVATE) 0.05 % external solution Apply 1 application topically 2 (two) times daily. Disp 90 days Patient taking differently: Apply 1 application topically 2 (two) times daily as needed (rash).  01/05/19  Yes Blanchard Kelch, NP  clobetasol ointment (TEMOVATE) 0.05 % APPLY 1 APPLICATION  TOPICALLY  2  TIMES DAILY Patient taking differently: Apply 1 application topically 2 (two) times daily as needed (rash).  01/06/18  Yes Ginnie Smart, MD  emtricitabine-rilpivir-tenofovir AF (ODEFSEY) 200-25-25 MG TABS tablet Take 1 tablet by mouth daily with breakfast. 01/05/19  Yes Dixon, Gomez Cleverly, NP  temazepam (RESTORIL) 15 MG capsule TAKE 1 CAPSULE BY MOUTH  EVERY NIGHT AT BEDTIME AS  NEEDED FOR SLEEP Patient taking differently: Take 15 mg by mouth at bedtime as needed for sleep.  07/25/19  Yes Ginnie Smart, MD  oxyCODONE-acetaminophen (PERCOCET) 5-325 MG tablet Take 2 tablets by mouth every 6 (six) hours as needed for severe pain. 08/16/19   Tranae Laramie, Ambrose Finland, MD    Allergies    Codeine  Review of Systems   Review of Systems All other systems reviewed and are negative except that which was mentioned in HPI  Physical Exam Updated Vital Signs BP 107/71    Pulse 81   Temp 98.4 F (36.9 C) (Oral)   Resp 14   Ht 5\' 4"  (1.626 m)   Wt 96.6 kg   SpO2 100%   BMI 36.56 kg/m   Physical Exam Vitals and nursing note reviewed.  Constitutional:      General: She is not in acute distress.    Appearance: She is well-developed.  HENT:     Head: Normocephalic and atraumatic.     Mouth/Throat:     Comments: Small abrasion w/ swelling central lower lip Eyes:     Conjunctiva/sclera: Conjunctivae normal.     Pupils: Pupils are equal, round, and reactive to light.  Cardiovascular:     Rate and Rhythm: Normal rate and regular rhythm.     Pulses: Normal pulses.     Heart sounds: Normal heart sounds. No murmur heard.   Pulmonary:     Effort: Pulmonary effort is normal.     Breath sounds: Normal breath sounds.  Chest:     Chest wall: Tenderness (R anterior chest wall, no crepitus) present.  Abdominal:     General: Bowel sounds are normal. There is no distension.     Palpations: Abdomen is soft.     Tenderness: There is no abdominal tenderness.  Musculoskeletal:        General: Swelling and tenderness present.     Cervical back: Neck supple.     Comments: Tenderness and swelling L lateral knee and distal thigh, bruising over b/l patellas, able to straight leg raise b/l; normal ROM R knee  Skin:    General: Skin is warm and dry.  Neurological:     Mental Status: She is alert and oriented to person, place, and time.     Sensory: No sensory deficit.     Comments: Fluent speech  Psychiatric:        Judgment: Judgment normal.     Comments: pleasant     ED Results / Procedures / Treatments   Labs (all labs ordered are listed, but only abnormal results are displayed) Labs Reviewed - No data to display  EKG None  Radiology DG Chest 2 View  Result Date: 08/16/2019 CLINICAL DATA:  LEFT femur pain EXAM: CHEST - 2 VIEW COMPARISON:  None. FINDINGS: Normal mediastinum and cardiac silhouette. Normal pulmonary vasculature. No evidence of effusion,  infiltrate, or pneumothorax. No acute bony abnormality. Insert fixation of RIGHT humerus IMPRESSION: No active cardiopulmonary disease. Electronically Signed   By: 08/18/2019 M.D.   On: 08/16/2019 09:28   DG Knee Left Port  Result  Date: 08/16/2019 CLINICAL DATA:  Fall. EXAM: PORTABLE LEFT KNEE - 1-2 VIEW COMPARISON:  No prior. FINDINGS: Nondisplaced fracture of the distal left femoral metaphysis noted. Tibia is intact. Mild tricompartment degenerative change. IMPRESSION: Nondisplaced fracture of the distal left femoral metaphysis. Electronically Signed   By: Marcello Moores  Register   On: 08/16/2019 09:30   DG Femur Min 2 Views Left  Result Date: 08/16/2019 CLINICAL DATA:  Fall.  Pain. EXAM: LEFT FEMUR 2 VIEWS COMPARISON:  None. FINDINGS: Two view exam of the left femur shows a nondisplaced fracture of the distal metaphysis. Fracture line is quite subtle in this osteopenic patient and distal extent of the fracture cannot be determined so as to exclude intra-articular extension. Lateral view limited by overlying clothing, but there may be a joint effusion in the suprapatellar bursa. Mild tricompartmental degenerative changes evident. IMPRESSION: 1. Nondisplaced distal femur fracture. Distal extent of the fracture cannot be determined due to osteopenia and overlying clothing. CT or MRI could be used to further evaluate as clinically warranted. 2. Probable joint effusion in the suprapatellar bursa. Electronically Signed   By: Misty Stanley M.D.   On: 08/16/2019 09:28    Procedures Procedures (including critical care time)  Medications Ordered in ED Medications  HYDROcodone-acetaminophen (NORCO/VICODIN) 5-325 MG per tablet 2 tablet (2 tablets Oral Given 08/16/19 0936)  oxyCODONE-acetaminophen (PERCOCET/ROXICET) 5-325 MG per tablet 2 tablet (2 tablets Oral Given 08/16/19 1357)    ED Course  I have reviewed the triage vital signs and the nursing notes.  Pertinent imaging results that were available  during my care of the patient were reviewed by me and considered in my medical decision making (see chart for details).    MDM Rules/Calculators/A&P                          Patient with distal left thigh/upper knee pain on exam. No head injury.  Neurovascularly intact distally.  X-rays show nondisplaced fracture of left distal femoral metaphysis.  I discussed this unusual injury with Dr. Mardelle Matte, orthopedics. He recommended knee immobilizer, toe touch weight bearing, and f/u in ortho clinic. PRovided w/ walker.  tdap UTD.  Discussed supportive measures, provided with pain control, and reviewed return precautions. Final Clinical Impression(s) / ED Diagnoses Final diagnoses:  Fall  Closed fracture of distal end of left femur, unspecified fracture morphology, initial encounter ([place])  Contusion of lip, initial encounter  Chest wall contusion, right, initial encounter    Rx / DC Orders ED Discharge Orders         Ordered    oxyCODONE-acetaminophen (PERCOCET) 5-325 MG tablet  Every 6 hours PRN,   Status:  Discontinued     Reprint     08/16/19 1046    oxyCODONE-acetaminophen (PERCOCET) 5-325 MG tablet  Every 6 hours PRN     Discontinue  Reprint     08/16/19 [place], Wenda Overland, MD 08/16/19 1447

## 2019-09-26 ENCOUNTER — Other Ambulatory Visit: Payer: Self-pay | Admitting: Infectious Diseases

## 2019-09-26 DIAGNOSIS — B20 Human immunodeficiency virus [HIV] disease: Secondary | ICD-10-CM

## 2019-09-26 DIAGNOSIS — G4709 Other insomnia: Secondary | ICD-10-CM

## 2019-09-26 NOTE — Telephone Encounter (Signed)
Please advise on refill request

## 2019-09-27 ENCOUNTER — Telehealth: Payer: Self-pay

## 2019-09-27 NOTE — Telephone Encounter (Signed)
Received refill request from patient for TEMAZEPAM 15MG  CAP. Will forward message to MD to advise on refill. Has a PCP. , Lorenso Courier

## 2019-10-22 ENCOUNTER — Other Ambulatory Visit: Payer: Self-pay | Admitting: Infectious Diseases

## 2019-10-22 DIAGNOSIS — Z1231 Encounter for screening mammogram for malignant neoplasm of breast: Secondary | ICD-10-CM

## 2019-10-31 ENCOUNTER — Telehealth: Payer: Self-pay

## 2019-10-31 ENCOUNTER — Other Ambulatory Visit: Payer: Self-pay

## 2019-10-31 DIAGNOSIS — G4709 Other insomnia: Secondary | ICD-10-CM

## 2019-10-31 MED ORDER — TEMAZEPAM 15 MG PO CAPS: ORAL_CAPSULE | ORAL | 0 refills | Status: DC

## 2019-10-31 NOTE — Telephone Encounter (Signed)
Patient called office today requesting script for b20 labs. States she gets here labs done at [employer] [employer]. Would like script mailed to home address before her follow up appointment in November. Will forward message to MD for orders.  Address confirmed.  Lorenso Courier, [place]

## 2019-10-31 NOTE — Telephone Encounter (Signed)
Please help me remember in clinic tomorrow Thanks!

## 2019-11-01 NOTE — Telephone Encounter (Signed)
Left voicemail for patient stating lab orders have been mailed out to home address. Should be arriving later next week. Advised to call back if orders have not been received in one week. Lorenso Courier, [place]

## 2019-11-13 ENCOUNTER — Other Ambulatory Visit: Payer: Self-pay | Admitting: Infectious Diseases

## 2019-11-13 DIAGNOSIS — B20 Human immunodeficiency virus [HIV] disease: Secondary | ICD-10-CM

## 2019-11-21 ENCOUNTER — Encounter: Payer: Self-pay | Admitting: Infectious Diseases

## 2019-12-03 ENCOUNTER — Ambulatory Visit
Admission: RE | Admit: 2019-12-03 | Discharge: 2019-12-03 | Disposition: A | Discharge status: Home / Self Care | Payer: 59 | Source: Ambulatory Visit | Attending: Infectious Diseases | Admitting: Infectious Diseases

## 2019-12-03 ENCOUNTER — Other Ambulatory Visit: Payer: Self-pay

## 2019-12-03 DIAGNOSIS — Z1231 Encounter for screening mammogram for malignant neoplasm of breast: Secondary | ICD-10-CM

## 2019-12-03 IMAGING — MG DIGITAL SCREENING BILAT W/ CAD
8 series · 8 of 8 positions shown · non-contrast
Comparison: Previous exam(s).

CLINICAL DATA: Screening.

EXAM:
DIGITAL SCREENING BILATERAL MAMMOGRAM WITH CAD

[R CC (1 of 2)]
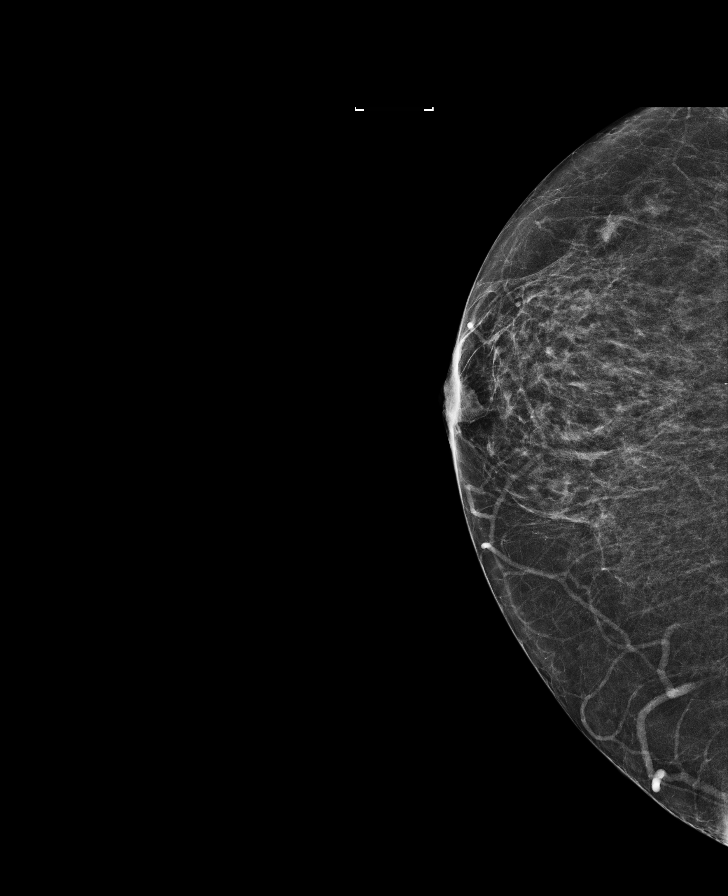

[R MLO (1 of 2)]
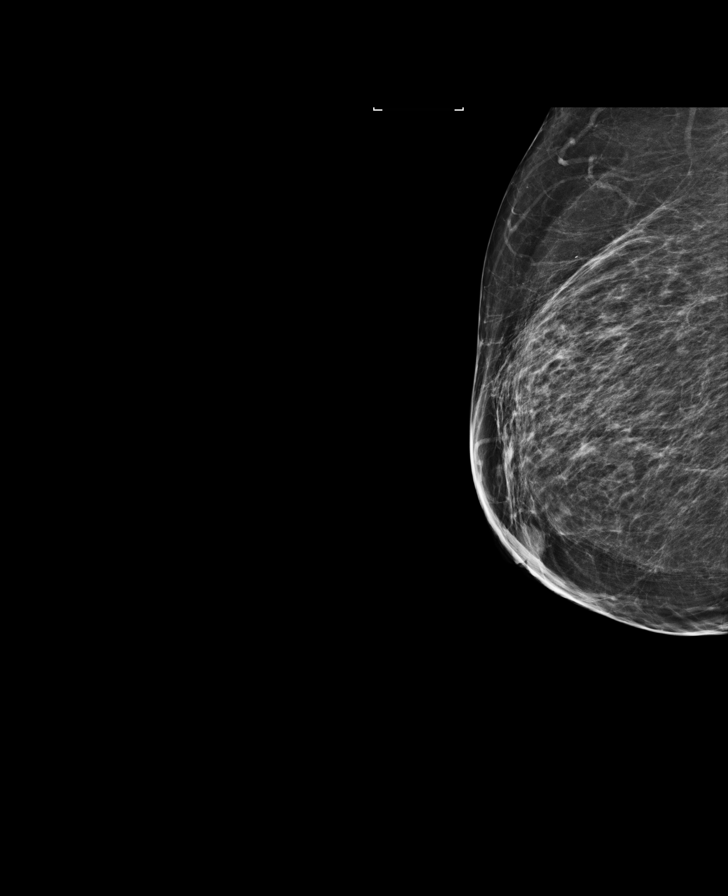

[R MLO (2 of 2)]
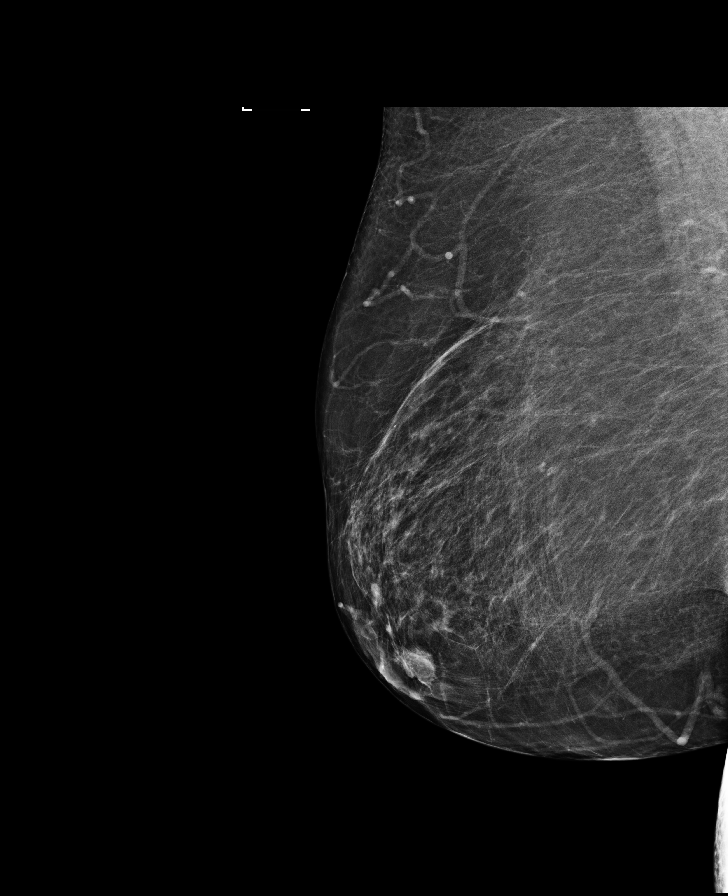

[L MLO (1 of 2)]
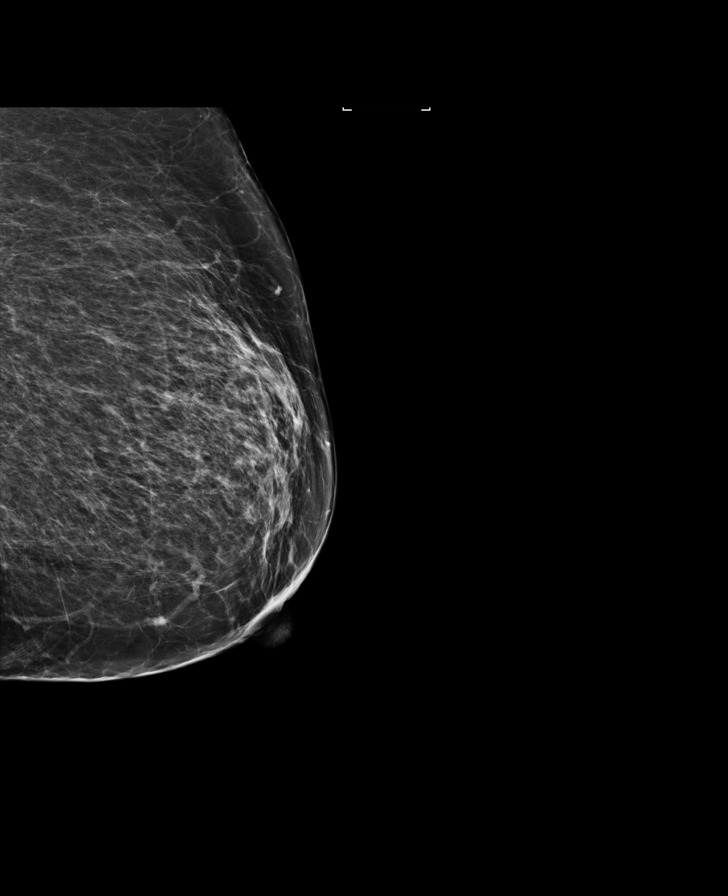

[R CC (2 of 2)]
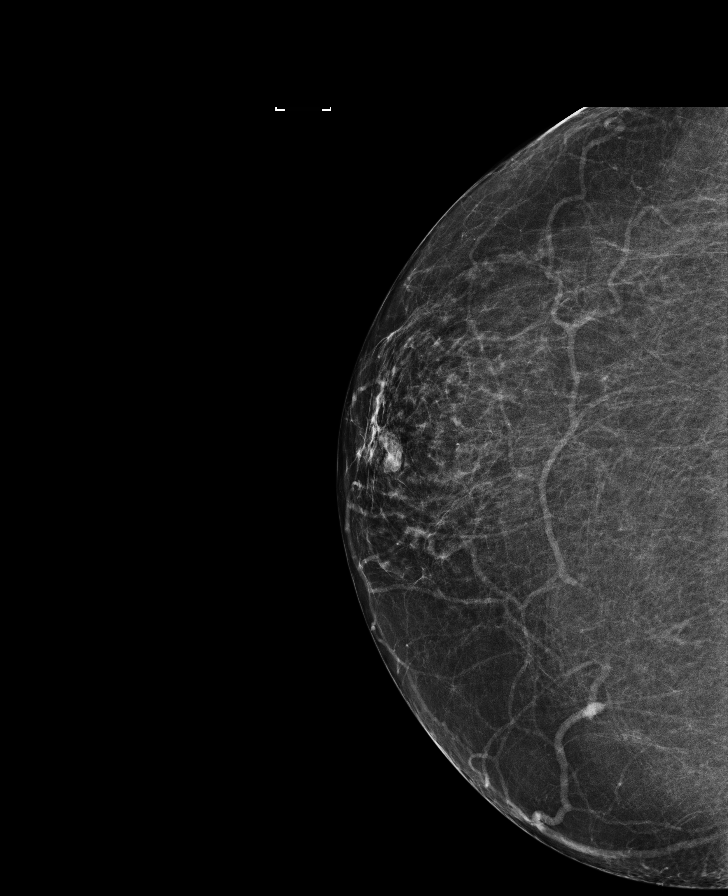

[L CC (1 of 2)]
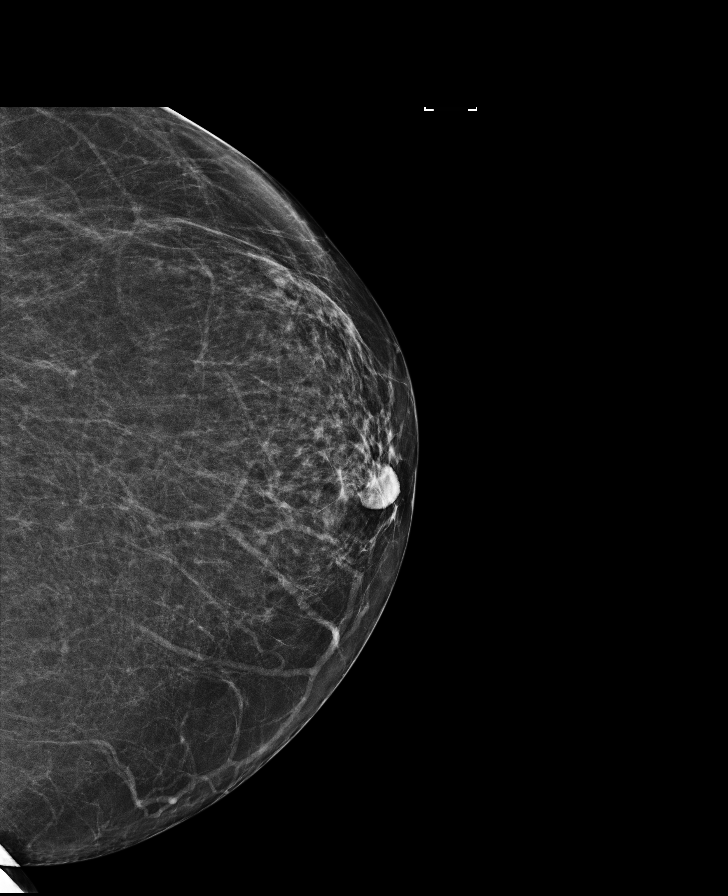

[L CC (2 of 2)]
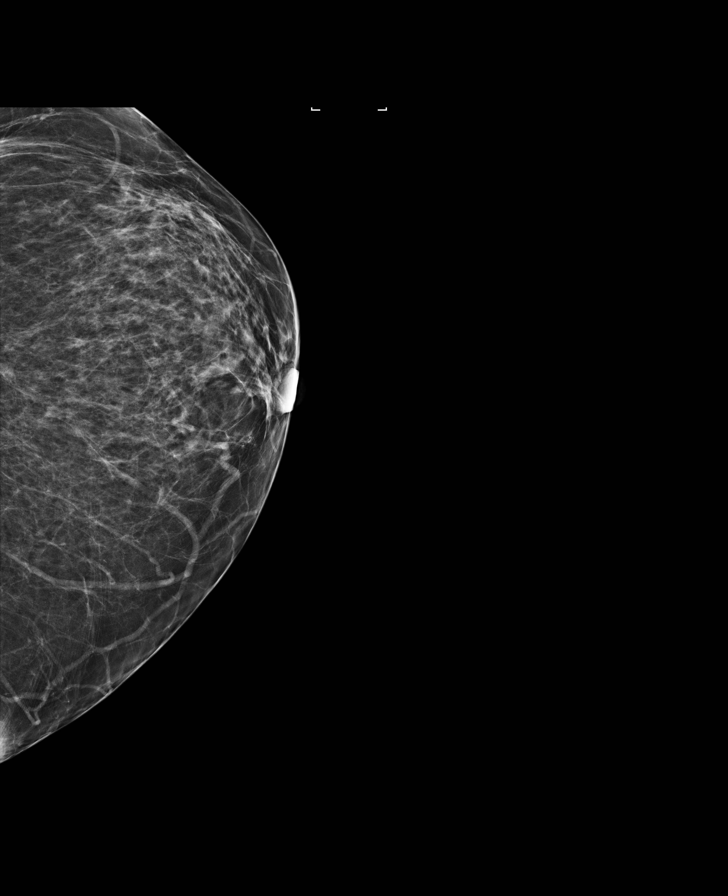

[L MLO (2 of 2)]
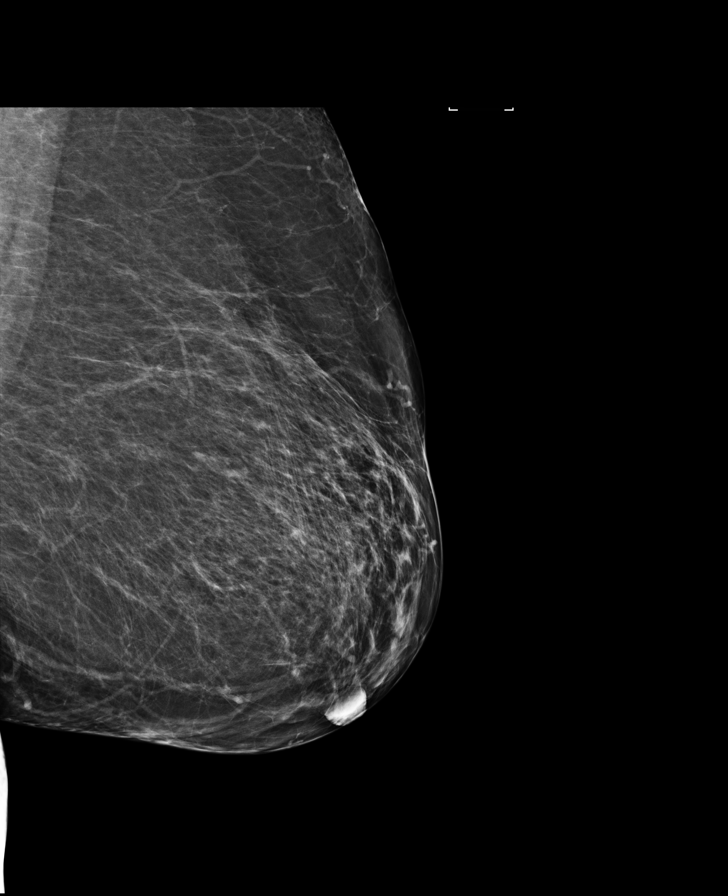

[8 of 8 positions shown; findings below may reference images not displayed]

ACR Breast Density Category b: There are scattered areas of
fibroglandular density.
FINDINGS: There are no findings suspicious for malignancy. Images were
processed with CAD.
IMPRESSION: No mammographic evidence of malignancy. A result letter of this
screening mammogram will be mailed directly to the patient.

RECOMMENDATION:
Screening mammogram in one year. (Code:[US])

BI-RADS CATEGORY  1: Negative.

## 2020-01-07 ENCOUNTER — Other Ambulatory Visit: Payer: Self-pay | Admitting: Infectious Diseases

## 2020-01-07 DIAGNOSIS — B20 Human immunodeficiency virus [HIV] disease: Secondary | ICD-10-CM

## 2020-01-11 ENCOUNTER — Ambulatory Visit: Payer: 59 | Admitting: Infectious Diseases

## 2020-01-15 ENCOUNTER — Telehealth: Payer: 59 | Admitting: Infectious Diseases

## 2020-01-22 ENCOUNTER — Other Ambulatory Visit: Payer: Self-pay

## 2020-01-22 ENCOUNTER — Encounter: Payer: Self-pay | Admitting: Infectious Diseases

## 2020-01-22 ENCOUNTER — Telehealth (INDEPENDENT_AMBULATORY_CARE_PROVIDER_SITE_OTHER): Payer: 59 | Admitting: Infectious Diseases

## 2020-01-22 DIAGNOSIS — G4709 Other insomnia: Secondary | ICD-10-CM

## 2020-01-22 DIAGNOSIS — B20 Human immunodeficiency virus [HIV] disease: Secondary | ICD-10-CM | POA: Diagnosis not present

## 2020-01-22 DIAGNOSIS — R87612 Low grade squamous intraepithelial lesion on cytologic smear of cervix (LGSIL): Secondary | ICD-10-CM | POA: Diagnosis not present

## 2020-01-22 MED ORDER — CLOBETASOL PROPIONATE 0.05 % EX SOLN: CUTANEOUS | 3 refills | Status: DC

## 2020-01-22 MED ORDER — TEMAZEPAM 15 MG PO CAPS: ORAL_CAPSULE | ORAL | 3 refills | Status: DC

## 2020-01-22 MED ORDER — ODEFSEY 200-25-25 MG PO TABS: 1.0000 | ORAL_TABLET | Freq: Every day | ORAL | 5 refills | Status: DC

## 2020-01-22 NOTE — Assessment & Plan Note (Signed)
She is doing well Has gotten covid vax Is now single, divorcing.  odefsey refilled.  Her RPR was also (-) Will see her back in 1 year.  She will get her labs done via work.

## 2020-01-22 NOTE — Assessment & Plan Note (Signed)
Her restoril is refilled.

## 2020-01-22 NOTE — Progress Notes (Signed)
   Subjective:    Patient ID: Gail Edwards, female    DOB: [birth year], 64 y.o.   MRN: [id number]  HPI 64yo F with HIV+ since May 1991.  Has IM PCP at [facility].  [id number], HIV RNA und, Chol 171 (11-21-19). On odefsy.  Has had health maintenance updates- Td 10/10/15, flu 11/21/15, PNVx 8/18,  Mammo 11-2019 (normal) Pap 2019 Colonoscopy 2020.  She has had COVID vax.   She is working at home for [employer]. She has her labs done there.  She had femur fracture this summer. She did not need surgery but had to have stabilizer.  Chanetta Marshall has left and moved to [place]. She has made peace with this the best she can.   Review of Systems  Constitutional: Negative for appetite change and unexpected weight change.  Respiratory: Negative for cough and shortness of breath.   Gastrointestinal: Negative for constipation and diarrhea.  Genitourinary: Negative for difficulty urinating.  is on appetite suppressant for wt loss.      Objective:   Physical Exam  1. Due to the national emergency this service was provided using telemedicine.  phone  2. Consent from the patient for the telehealth visit and that you identified patient- done  3. Your locations, Pt and Provider- home, [facility]  4. Chief complaint for visit- HIV f/u  5. Document anyone else on the call- none  6. If the visit was a phone call, that you include the time you spent on the call <10 minutes         Assessment & Plan:

## 2020-01-22 NOTE — Assessment & Plan Note (Signed)
States her most recent PAP was normal.  Has PCP f/u as well.

## 2020-01-29 ENCOUNTER — Other Ambulatory Visit: Payer: Self-pay | Admitting: Infectious Diseases

## 2020-01-29 ENCOUNTER — Other Ambulatory Visit: Payer: Self-pay

## 2020-01-29 ENCOUNTER — Telehealth: Payer: Self-pay

## 2020-01-29 DIAGNOSIS — G4709 Other insomnia: Secondary | ICD-10-CM

## 2020-01-29 DIAGNOSIS — B20 Human immunodeficiency virus [HIV] disease: Secondary | ICD-10-CM

## 2020-01-29 MED ORDER — CLOBETASOL PROPIONATE 0.05 % EX OINT: TOPICAL_OINTMENT | CUTANEOUS | 5 refills | Status: AC

## 2020-01-29 MED ORDER — TEMAZEPAM 15 MG PO CAPS: ORAL_CAPSULE | ORAL | 0 refills | Status: DC

## 2020-01-29 NOTE — Telephone Encounter (Signed)
Patient called requesting refills on her temazepam and clobetasol ointment. She states she received the clobetasol solution. RN informed her that the temazepam was sent in today, will route to provider regarding the ointment.   Sandie Ano, RN

## 2020-01-29 NOTE — Telephone Encounter (Signed)
Fixed.

## 2020-04-02 ENCOUNTER — Other Ambulatory Visit: Payer: Self-pay | Admitting: Infectious Diseases

## 2020-04-02 DIAGNOSIS — G4709 Other insomnia: Secondary | ICD-10-CM

## 2020-04-02 MED ORDER — TEMAZEPAM 15 MG PO CAPS: ORAL_CAPSULE | ORAL | 0 refills | Status: DC

## 2020-04-02 NOTE — Telephone Encounter (Signed)
MD will need to e scribe.

## 2020-04-30 ENCOUNTER — Inpatient Hospital Stay (HOSPITAL_COMMUNITY)
Admission: EM | Admit: 2020-04-30 | Discharge: 2020-05-08 | DRG: 094 | Disposition: A | Discharge status: Rehabilitation Facility | Payer: 59 | Attending: Internal Medicine | Admitting: Internal Medicine

## 2020-04-30 ENCOUNTER — Emergency Department (HOSPITAL_COMMUNITY): Payer: 59

## 2020-04-30 ENCOUNTER — Other Ambulatory Visit: Payer: Self-pay | Admitting: Infectious Diseases

## 2020-04-30 DIAGNOSIS — Z79899 Other long term (current) drug therapy: Secondary | ICD-10-CM

## 2020-04-30 DIAGNOSIS — G9341 Metabolic encephalopathy: Secondary | ICD-10-CM | POA: Diagnosis not present

## 2020-04-30 DIAGNOSIS — Z20822 Contact with and (suspected) exposure to covid-19: Secondary | ICD-10-CM | POA: Diagnosis present

## 2020-04-30 DIAGNOSIS — G4459 Other complicated headache syndrome: Secondary | ICD-10-CM | POA: Diagnosis not present

## 2020-04-30 DIAGNOSIS — G009 Bacterial meningitis, unspecified: Principal | ICD-10-CM | POA: Diagnosis present

## 2020-04-30 DIAGNOSIS — I10 Essential (primary) hypertension: Secondary | ICD-10-CM | POA: Diagnosis present

## 2020-04-30 DIAGNOSIS — G4709 Other insomnia: Secondary | ICD-10-CM

## 2020-04-30 DIAGNOSIS — I639 Cerebral infarction, unspecified: Secondary | ICD-10-CM | POA: Diagnosis not present

## 2020-04-30 DIAGNOSIS — Z833 Family history of diabetes mellitus: Secondary | ICD-10-CM | POA: Diagnosis not present

## 2020-04-30 DIAGNOSIS — G0490 Encephalitis and encephalomyelitis, unspecified: Secondary | ICD-10-CM | POA: Diagnosis present

## 2020-04-30 DIAGNOSIS — R7303 Prediabetes: Secondary | ICD-10-CM | POA: Diagnosis present

## 2020-04-30 DIAGNOSIS — J9600 Acute respiratory failure, unspecified whether with hypoxia or hypercapnia: Secondary | ICD-10-CM | POA: Diagnosis not present

## 2020-04-30 DIAGNOSIS — Z885 Allergy status to narcotic agent status: Secondary | ICD-10-CM | POA: Diagnosis not present

## 2020-04-30 DIAGNOSIS — Z781 Physical restraint status: Secondary | ICD-10-CM

## 2020-04-30 DIAGNOSIS — G9600 Cerebrospinal fluid leak, unspecified: Secondary | ICD-10-CM | POA: Diagnosis present

## 2020-04-30 DIAGNOSIS — B20 Human immunodeficiency virus [HIV] disease: Secondary | ICD-10-CM | POA: Diagnosis present

## 2020-04-30 DIAGNOSIS — G9349 Other encephalopathy: Secondary | ICD-10-CM | POA: Diagnosis not present

## 2020-04-30 DIAGNOSIS — E785 Hyperlipidemia, unspecified: Secondary | ICD-10-CM | POA: Diagnosis present

## 2020-04-30 DIAGNOSIS — R9389 Abnormal findings on diagnostic imaging of other specified body structures: Secondary | ICD-10-CM | POA: Diagnosis not present

## 2020-04-30 DIAGNOSIS — R2990 Unspecified symptoms and signs involving the nervous system: Secondary | ICD-10-CM | POA: Diagnosis not present

## 2020-04-30 DIAGNOSIS — E871 Hypo-osmolality and hyponatremia: Secondary | ICD-10-CM | POA: Diagnosis not present

## 2020-04-30 DIAGNOSIS — R739 Hyperglycemia, unspecified: Secondary | ICD-10-CM | POA: Diagnosis not present

## 2020-04-30 DIAGNOSIS — G039 Meningitis, unspecified: Secondary | ICD-10-CM | POA: Diagnosis present

## 2020-04-30 DIAGNOSIS — Z4659 Encounter for fitting and adjustment of other gastrointestinal appliance and device: Secondary | ICD-10-CM | POA: Diagnosis not present

## 2020-04-30 DIAGNOSIS — A419 Sepsis, unspecified organism: Secondary | ICD-10-CM | POA: Diagnosis present

## 2020-04-30 DIAGNOSIS — Z808 Family history of malignant neoplasm of other organs or systems: Secondary | ICD-10-CM | POA: Diagnosis not present

## 2020-04-30 DIAGNOSIS — R29810 Facial weakness: Secondary | ICD-10-CM | POA: Diagnosis present

## 2020-04-30 DIAGNOSIS — Z6841 Body Mass Index (BMI) 40.0 and over, adult: Secondary | ICD-10-CM

## 2020-04-30 DIAGNOSIS — F802 Mixed receptive-expressive language disorder: Secondary | ICD-10-CM | POA: Diagnosis present

## 2020-04-30 DIAGNOSIS — E010 Iodine-deficiency related diffuse (endemic) goiter: Secondary | ICD-10-CM

## 2020-04-30 DIAGNOSIS — J9601 Acute respiratory failure with hypoxia: Secondary | ICD-10-CM | POA: Diagnosis not present

## 2020-04-30 DIAGNOSIS — Z01818 Encounter for other preprocedural examination: Secondary | ICD-10-CM | POA: Diagnosis not present

## 2020-04-30 DIAGNOSIS — R4701 Aphasia: Secondary | ICD-10-CM | POA: Diagnosis present

## 2020-04-30 DIAGNOSIS — L409 Psoriasis, unspecified: Secondary | ICD-10-CM | POA: Diagnosis present

## 2020-04-30 DIAGNOSIS — I6389 Other cerebral infarction: Secondary | ICD-10-CM | POA: Diagnosis not present

## 2020-04-30 LAB — I-STAT CHEM 8, ED
BUN: 16 mg/dL (ref 8–23)
Calcium, Ion: 1.07 mmol/L — ABNORMAL LOW (ref 1.15–1.40)
Chloride: 104 mmol/L (ref 98–111)
Creatinine, Ser: 0.8 mg/dL (ref 0.44–1.00)
Glucose, Bld: 106 mg/dL — ABNORMAL HIGH (ref 70–99)
HCT: 40 % (ref 36.0–46.0)
Hemoglobin: 13.6 g/dL (ref 12.0–15.0)
Potassium: 4.1 mmol/L (ref 3.5–5.1)
Sodium: 136 mmol/L (ref 135–145)
TCO2: 22 mmol/L (ref 22–32)

## 2020-04-30 LAB — COMPREHENSIVE METABOLIC PANEL
ALT: 17 U/L (ref 0–44)
AST: 20 U/L (ref 15–41)
Albumin: 3.9 g/dL (ref 3.5–5.0)
Alkaline Phosphatase: 89 U/L (ref 38–126)
Anion gap: 10 (ref 5–15)
BUN: 14 mg/dL (ref 8–23)
CO2: 20 mmol/L — ABNORMAL LOW (ref 22–32)
Calcium: 9.1 mg/dL (ref 8.9–10.3)
Chloride: 105 mmol/L (ref 98–111)
Creatinine, Ser: 0.88 mg/dL (ref 0.44–1.00)
GFR, Estimated: >60 mL/min (ref >60)
Glucose, Bld: 107 mg/dL — ABNORMAL HIGH (ref 70–99)
Potassium: 4.1 mmol/L (ref 3.5–5.1)
Sodium: 135 mmol/L (ref 135–145)
Total Bilirubin: 1.1 mg/dL (ref 0.3–1.2)
Total Protein: 6.7 g/dL (ref 6.5–8.1)

## 2020-04-30 LAB — DIFFERENTIAL
Abs Immature Granulocytes: 0.04 10*3/uL (ref 0.00–0.07)
Basophils Absolute: 0 10*3/uL (ref 0.0–0.1)
Basophils Relative: 0 %
Eosinophils Absolute: 0 10*3/uL (ref 0.0–0.5)
Eosinophils Relative: 0 %
Immature Granulocytes: 0 %
Lymphocytes Relative: 5 %
Lymphs Abs: 0.5 10*3/uL — ABNORMAL LOW (ref 0.7–4.0)
Monocytes Absolute: 0.5 10*3/uL (ref 0.1–1.0)
Monocytes Relative: 5 %
Neutro Abs: 9.6 10*3/uL — ABNORMAL HIGH (ref 1.7–7.7)
Neutrophils Relative %: 90 %

## 2020-04-30 LAB — CBC
HCT: 38.5 % (ref 36.0–46.0)
Hemoglobin: 13 g/dL (ref 12.0–15.0)
MCH: 30.2 pg (ref 26.0–34.0)
MCHC: 33.8 g/dL (ref 30.0–36.0)
MCV: 89.5 fL (ref 80.0–100.0)
Platelets: 336 10*3/uL (ref 150–400)
RBC: 4.3 MIL/uL (ref 3.87–5.11)
RDW: 13.6 % (ref 11.5–15.5)
WBC: 10.7 10*3/uL — ABNORMAL HIGH (ref 4.0–10.5)
nRBC: 0 % (ref 0.0–0.2)

## 2020-04-30 LAB — PROTIME-INR
INR: 1.1 (ref 0.8–1.2)
Prothrombin Time: 13.8 seconds (ref 11.4–15.2)

## 2020-04-30 LAB — APTT: aPTT: 27 seconds (ref 24–36)

## 2020-04-30 LAB — CBG MONITORING, ED: Glucose-Capillary: 113 mg/dL — ABNORMAL HIGH (ref 70–99)

## 2020-04-30 IMAGING — CT CT ANGIO NECK
2 of 7 series · 8 of 33 positions shown · IV contrast (omnipaque)
Comparison: None.

CLINICAL DATA: Headache and left facial droop

EXAM:
CT ANGIOGRAPHY HEAD AND NECK
TECHNIQUE: Multidetector CT imaging of the head and neck was performed using
the standard protocol during bolus administration of intravenous
contrast. Multiplanar CT image reconstructions and MIPs were
obtained to evaluate the vascular anatomy. Carotid stenosis
measurements (when applicable) are obtained utilizing NASCET
criteria, using the distal internal carotid diameter as the
denominator.
CONTRAST:  75mL OMNIPAQUE IOHEXOL 350 MG/ML SOLN

[Series 5: cta neck/head · axial · 0.66mm/px · z∈[+1214,+1328]mm · 2 of 172 slices shown]
[im 58/172  soft-tissue]
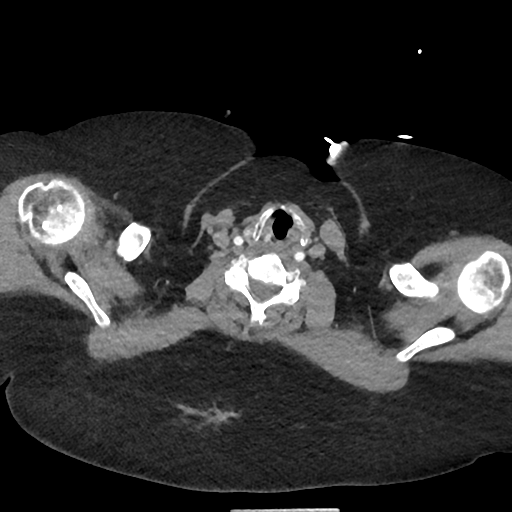
[im 115/172  soft-tissue]
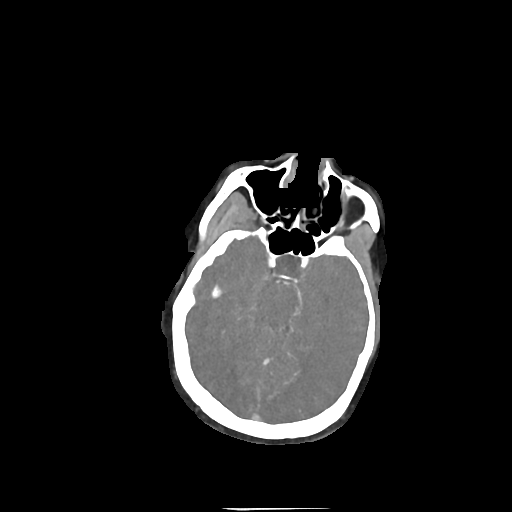

[Series 7: ax thins · axial · 0.53mm/px · z∈[+1148,+1394]mm · 6 of 344 slices shown]
[im 50/344  soft-tissue]
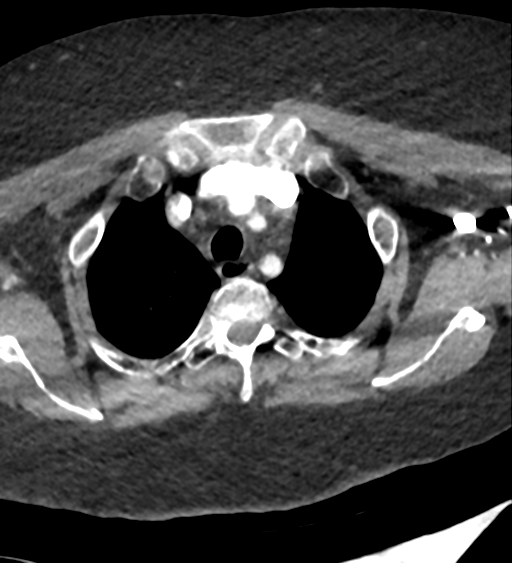
[im 99/344  bone]
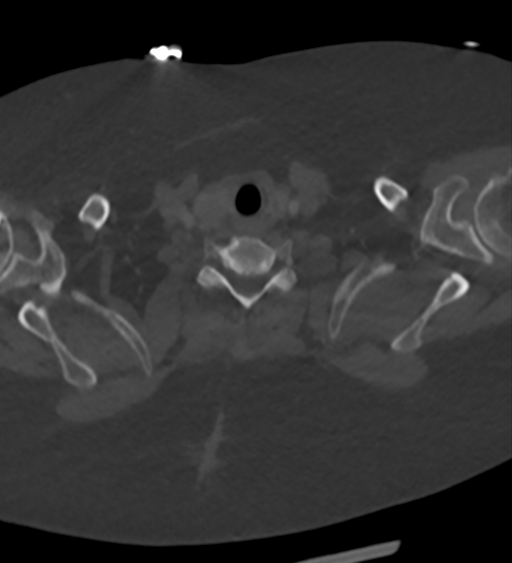
[im 148/344  soft-tissue]
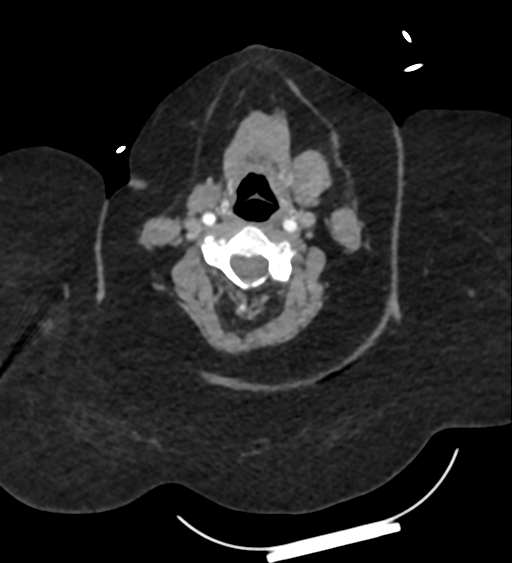
[im 197/344  bone]
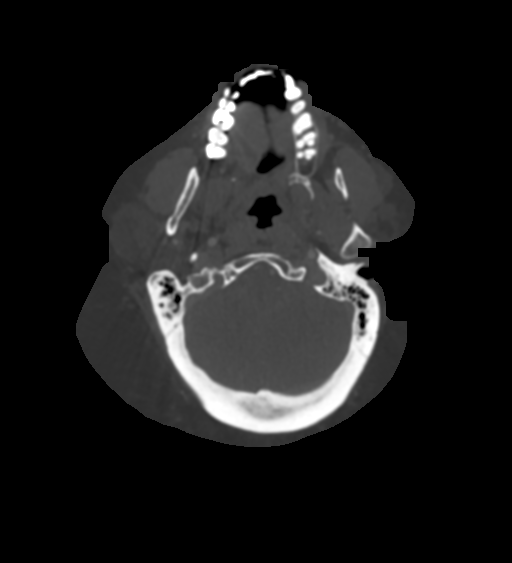
[im 246/344  soft-tissue]
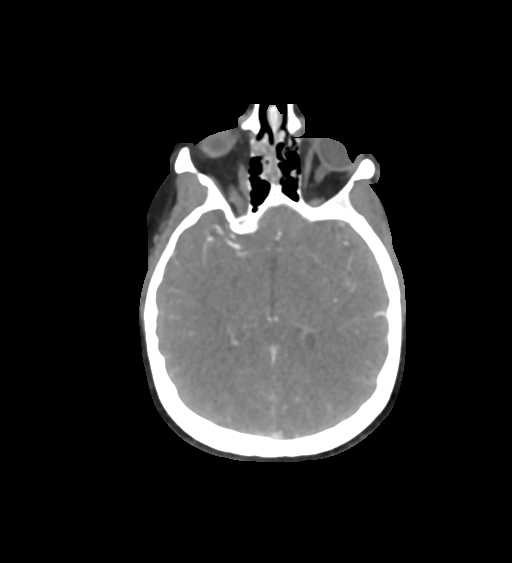
[im 295/344  bone]
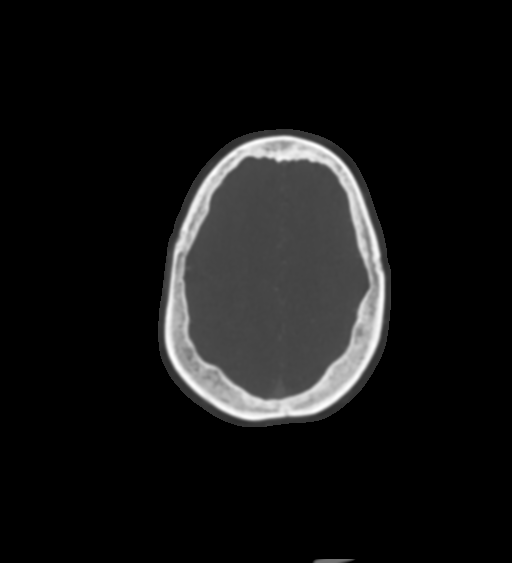

[8 of 33 positions shown; findings below may reference images not displayed]

FINDINGS: CTA NECK FINDINGS

SKELETON: There is no bony spinal canal stenosis. No lytic or
blastic lesion.

OTHER NECK: Normal pharynx, larynx and major salivary glands. No
cervical lymphadenopathy. Enlarged right thyroid lobe.

UPPER CHEST: No pneumothorax or pleural effusion. No nodules or
masses.

AORTIC ARCH:

There is no calcific atherosclerosis of the aortic arch. There is no
aneurysm, dissection or hemodynamically significant stenosis of the
visualized portion of the aorta. Conventional 3 vessel aortic
branching pattern. The visualized proximal subclavian arteries are
widely patent.

RIGHT CAROTID SYSTEM: Normal without aneurysm, dissection or
stenosis.

LEFT CAROTID SYSTEM: No dissection, occlusion or aneurysm. Mild
atherosclerotic calcification at the carotid bifurcation without
hemodynamically significant stenosis.

VERTEBRAL ARTERIES: Left dominant configuration. Both origins are
clearly patent. There is no dissection, occlusion or flow-limiting
stenosis to the skull base (V1-V3 segments).

CTA HEAD FINDINGS

POSTERIOR CIRCULATION:

--Vertebral arteries: Normal V4 segments.

--Inferior cerebellar arteries: Normal.

--Basilar artery: Normal.

--Superior cerebellar arteries: Normal.

--Posterior cerebral arteries (PCA): Normal.

ANTERIOR CIRCULATION:

--Intracranial internal carotid arteries: Normal.

--Anterior cerebral arteries (ACA): Normal. Both A1 segments are
present. Patent anterior communicating artery (a-comm).

--Middle cerebral arteries (MCA): Normal.

VENOUS SINUSES: As permitted by contrast timing, patent.

ANATOMIC VARIANTS: None

Review of the MIP images confirms the above findings.
IMPRESSION: 1. No intracranial arterial occlusion or high-grade stenosis.
2. Enlarged right thyroid lobe. Recommend thyroid ultrasound (ref: [HOSPITAL]. [DATE]): 143-50).

## 2020-04-30 IMAGING — CT CT HEAD CODE STROKE
4 series · 17 of 47 positions shown, 19 images · non-contrast
Comparison: None.

CLINICAL DATA: Code stroke.  Facial droop, left.  Headache.

EXAM:
CT HEAD WITHOUT CONTRAST
TECHNIQUE: Contiguous axial images were obtained from the base of the skull
through the vertex without intravenous contrast.

[Series 3: head wo · axial · 0.42mm/px · z∈[+1280,+1414]mm · 7 of 37 slices shown, 9 images]
[im 5/37  brain]
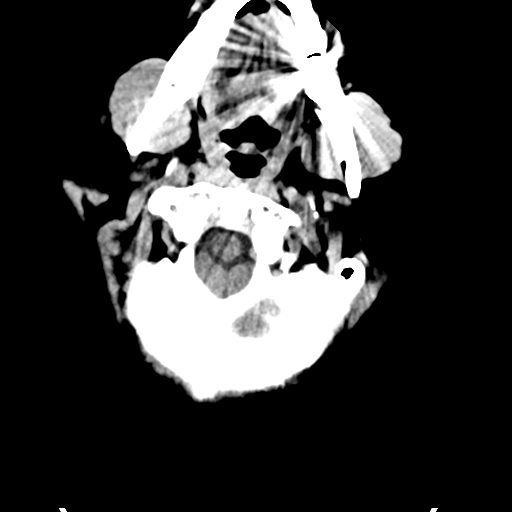
[im 5/37  bone]
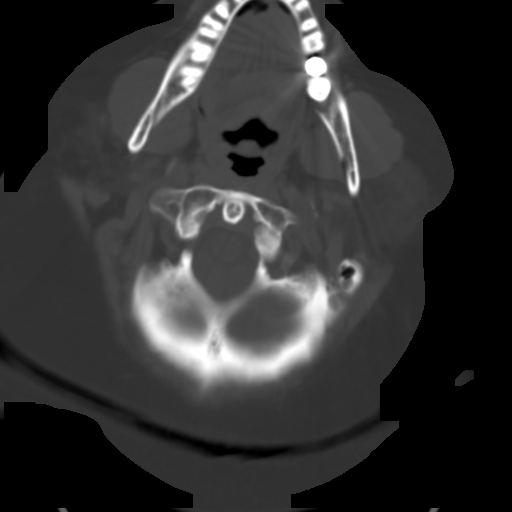
[im 10/37  brain]
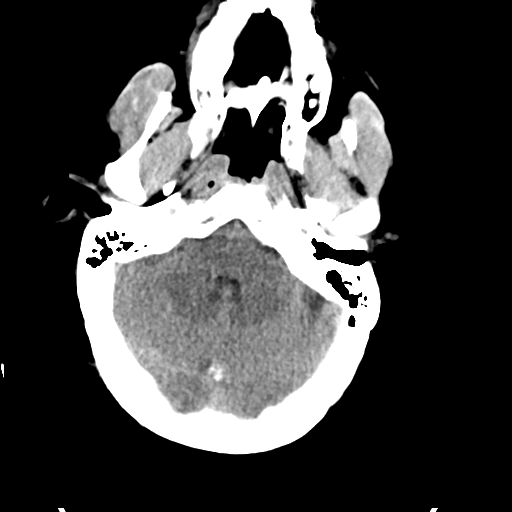
[im 14/37  brain]
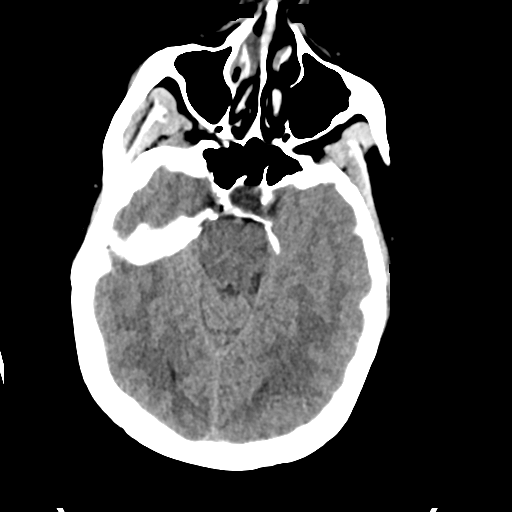
[im 19/37  brain]
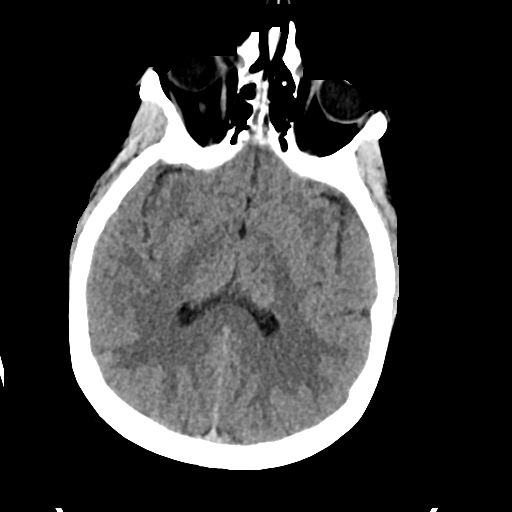
[im 23/37  brain]
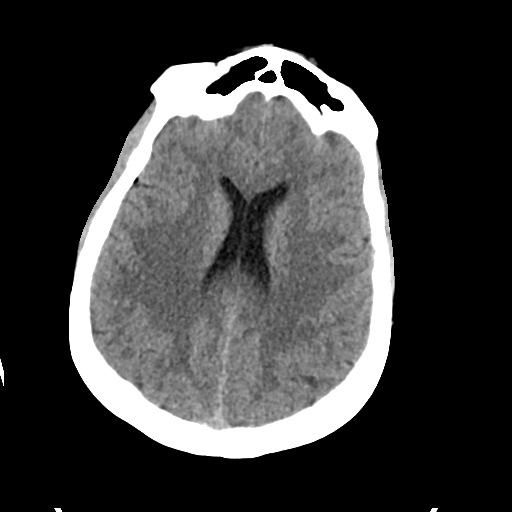
[im 23/37  bone]
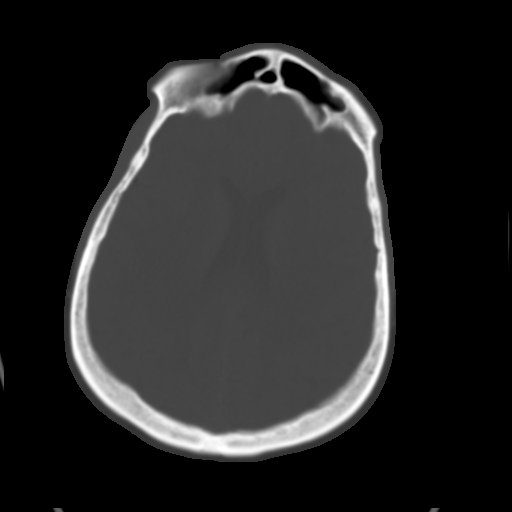
[im 28/37  brain]
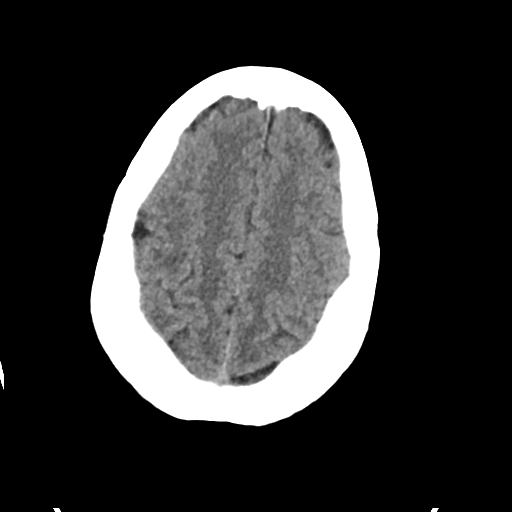
[im 32/37  brain]
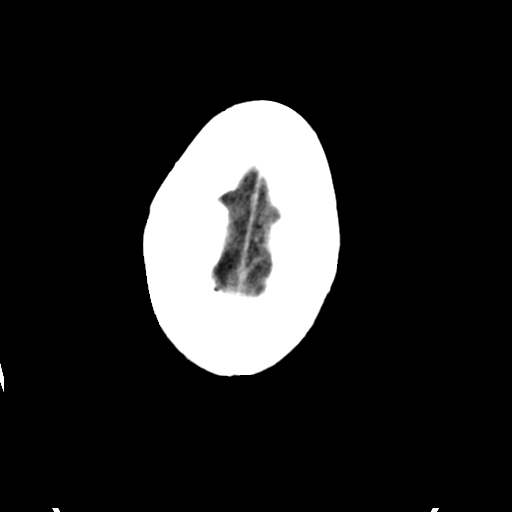

[Series 4: head bone · axial · 0.42mm/px · z∈[+1278,+1340]mm · 4 of 91 slices shown]
[im 10/91  bone]
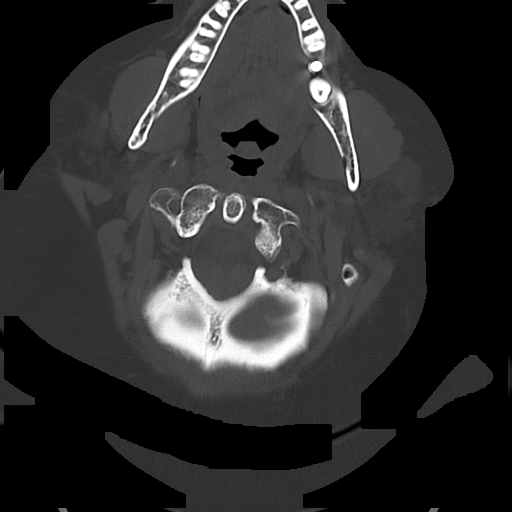
[im 19/91  bone]
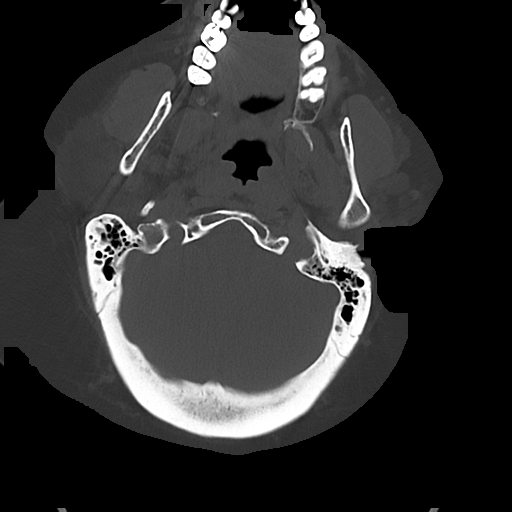
[im 28/91  bone]
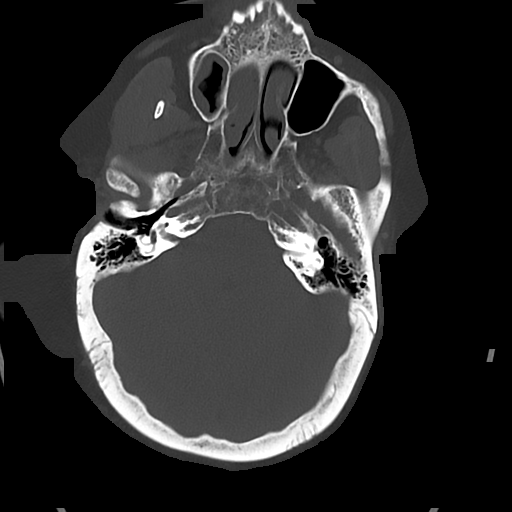
[im 41/91  bone]
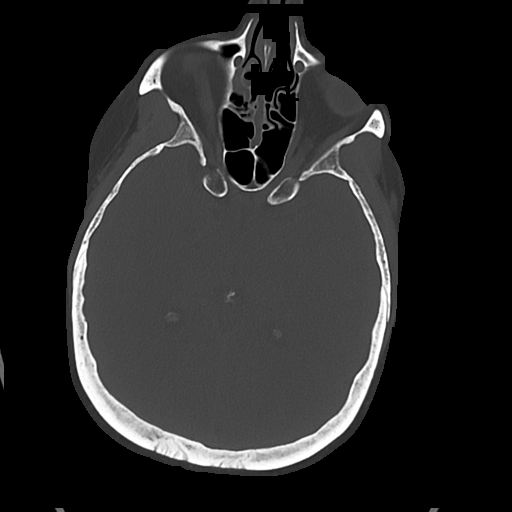

[Series 5: cor soft · coronal · 0.41mm/px · 3 of 73 slices shown]
[im 25/73  brain]
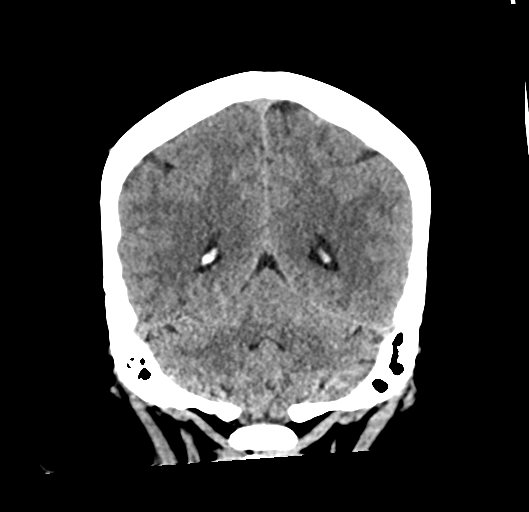
[im 33/73  brain]
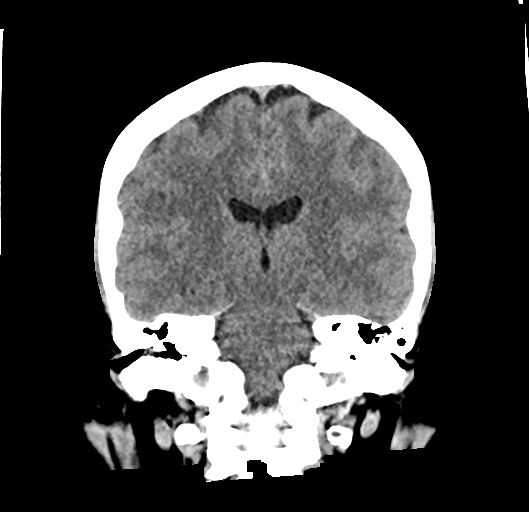
[im 41/73  brain]
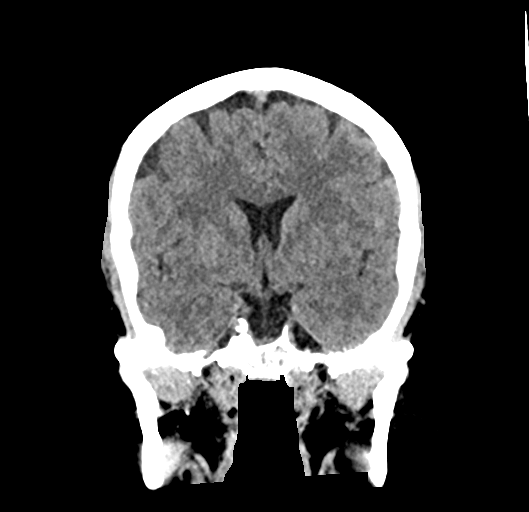

[Series 6: sag soft · sagittal · 0.41mm/px · 3 of 73 slices shown]
[im 25/73  brain]
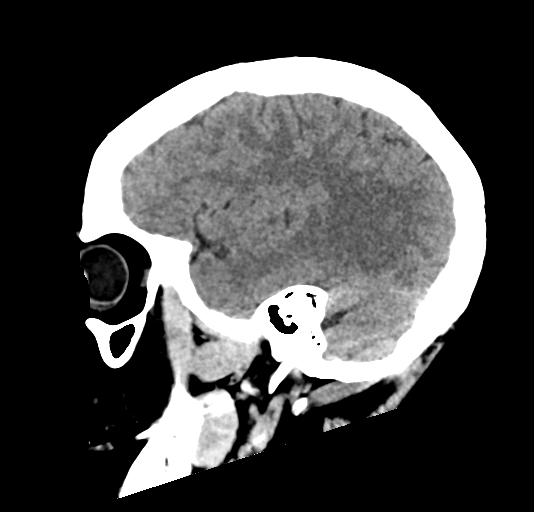
[im 37/73  brain]
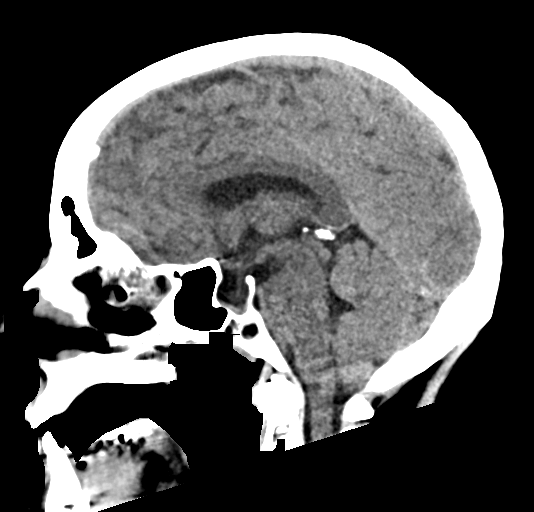
[im 49/73  brain]
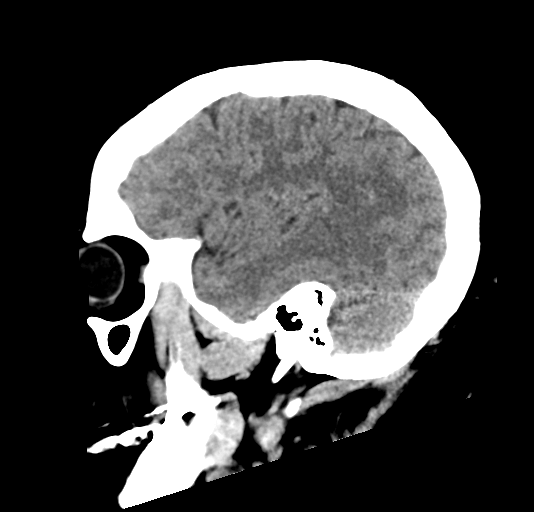

[17 of 47 positions shown; findings below may reference images not displayed]

FINDINGS: Brain: There is no mass, hemorrhage or extra-axial collection. The
size and configuration of the ventricles and extra-axial CSF spaces
are normal. The brain parenchyma is normal, without evidence of
acute or chronic infarction.

Vascular: No abnormal hyperdensity of the major intracranial
arteries or dural venous sinuses. No intracranial atherosclerosis.

Skull: The visualized skull base, calvarium and extracranial soft
tissues are normal.

Sinuses/Orbits: No fluid levels or advanced mucosal thickening of
the visualized paranasal sinuses. No mastoid or middle ear effusion.
The orbits are normal.

ASPECTS (Alberta Stroke Program Early CT Score)

- Ganglionic level infarction (caudate, lentiform nuclei, internal
capsule, insula, M1-M3 cortex): 7

- Supraganglionic infarction (M4-M6 cortex): 3

Total score (0-10 with 10 being normal): 10
IMPRESSION: 1. Normal head CT.
2. ASPECTS is 10.

These results were communicated to Dr. BCH at [DATE] on
[DATE] by text page via the [employer] messaging system.

## 2020-04-30 MED ORDER — SODIUM CHLORIDE 0.9 % IV SOLN: INTRAVENOUS | Status: AC

## 2020-04-30 MED ORDER — SODIUM CHLORIDE 0.9 % IV SOLN: 50.0000 mL | Freq: Once | INTRAVENOUS | Status: DC

## 2020-04-30 MED ORDER — SODIUM CHLORIDE 0.9% FLUSH: 3.0000 mL | Freq: Once | INTRAVENOUS | Status: DC

## 2020-04-30 MED ORDER — ACETAMINOPHEN 325 MG PO TABS
650.0000 mg | ORAL_TABLET | Freq: Once | ORAL | Status: AC
  Administered 2020-04-30: 650 mg via ORAL

## 2020-04-30 MED ORDER — STROKE: EARLY STAGES OF RECOVERY BOOK: Freq: Once | Status: AC

## 2020-04-30 MED ORDER — SENNOSIDES-DOCUSATE SODIUM 8.6-50 MG PO TABS: 1.0000 | ORAL_TABLET | Freq: Every evening | ORAL | Status: DC | PRN

## 2020-04-30 MED ORDER — ALTEPLASE (STROKE) FULL DOSE INFUSION
90.0000 mg | Freq: Once | INTRAVENOUS | Status: AC
  Administered 2020-04-30: 90 mg via INTRAVENOUS
  Filled 2020-04-30: qty 100

## 2020-04-30 MED ORDER — ACETAMINOPHEN 650 MG RE SUPP
650.0000 mg | RECTAL | Status: DC | PRN
  Administered 2020-05-01: 650 mg via RECTAL
  Filled 2020-04-30: qty 1

## 2020-04-30 MED ORDER — ACETAMINOPHEN 160 MG/5ML PO SOLN
650.0000 mg | ORAL | Status: DC | PRN
  Administered 2020-05-01 – 2020-05-02 (×4): 650 mg
  Filled 2020-04-30 (×4): qty 20.3

## 2020-04-30 MED ORDER — ACETAMINOPHEN 325 MG PO TABS
650.0000 mg | ORAL_TABLET | ORAL | Status: DC | PRN
  Administered 2020-05-03 – 2020-05-04 (×3): 650 mg via ORAL
  Filled 2020-04-30 (×4): qty 2

## 2020-04-30 MED ORDER — IOHEXOL 350 MG/ML SOLN
75.0000 mL | Freq: Once | INTRAVENOUS | Status: AC | PRN
  Administered 2020-04-30: 75 mL via INTRAVENOUS

## 2020-04-30 MED ORDER — CLEVIDIPINE BUTYRATE 0.5 MG/ML IV EMUL
0.0000 mg/h | INTRAVENOUS | Status: DC
  Filled 2020-04-30: qty 50

## 2020-04-30 MED ORDER — PANTOPRAZOLE SODIUM 40 MG IV SOLR
40.0000 mg | Freq: Every day | INTRAVENOUS | Status: DC
  Administered 2020-05-01: 40 mg via INTRAVENOUS
  Filled 2020-04-30 (×2): qty 40

## 2020-04-30 MED ORDER — PROMETHAZINE HCL 25 MG/ML IJ SOLN
25.0000 mg | Freq: Once | INTRAMUSCULAR | Status: AC
  Administered 2020-04-30: 25 mg via INTRAVENOUS
  Filled 2020-04-30: qty 1

## 2020-04-30 NOTE — H&P (Addendum)
Admission H&P    Chief Complaint: Acute onset of sensory aphasia with waxing and waning course  HPI: Gail Edwards is an 65 y.o. female with a PMHx of HIV and prediabetes presenting to the ED as a Code Stroke. LKN was 7:15 PM, but she had had a worsening bifrontal nonthrobbing headache since 4 PM. While at church, her headache continued to worsen. After the service, she spoke with some people while going back to her car and she was noted to have altered speech. EMS was called and on arrival they noted intermittent garbled speech and left facial droop. Members of her congregation had noted LUE weakness, but EMS did not appreciate this. Her car was parked crooked per EMS, so they suspect that her symptoms may have been present at the time of her initial arrival to church, but per congregants she was at her normal baseline during the service. She initially had been speaking normally to congregants, with TOSO noted by them to be 7:15 PM. EMS noted her speech and facial droop to wax and wane in concert during initial assessment and transport. BP was 190/110 and CBG was 144. On arrival to the ED, she continued to having intermittent garbled speech, but comprehension was intact.   She is not taking ASA, Plavix or an anticoagulant.   LSN: 7:15 PM tPA Given: Yes mRS: 0  Past Medical History:  Diagnosis Date  . Abnormal Pap smear 2009   lsil-cin1  . HIV infection (HCC)   . Pre-diabetes     Past Surgical History:  Procedure Laterality Date  . CESAREAN SECTION    . CHOLECYSTECTOMY    . HERNIA REPAIR    . ORIF HUMERUS FRACTURE Right 12/17/2016   Procedure: OPEN REDUCTION INTERNAL FIXATION (ORIF) RIGHT PROXIMAL HUMERUS FRACTURE;  Surgeon: Sheral Apley, MD;  Location: [facility];  Service: Orthopedics;  Laterality: Right;    Family History  Problem Relation Age of Onset  . Diabetes Mother   . Cancer Father        brain  . Breast cancer Neg Hx    Social History:  reports that  she has never smoked. She has never used smokeless tobacco. She reports that she does not drink alcohol and does not use drugs.  Allergies:  Allergies  Allergen Reactions  . Codeine Other (See Comments)    Headache    Home Meds: No current facility-administered medications on file prior to encounter.   Current Outpatient Medications on File Prior to Encounter  Medication Sig Dispense Refill  . calcium carbonate (TUMS) 500 MG chewable tablet Chew 1,500 mg by mouth daily.    . cholecalciferol (VITAMIN D3) 25 MCG (1000 UNIT) tablet Take 2,000 Units by mouth daily.    . clobetasol ointment (TEMOVATE) 0.05 % APPLY 1 APPLICATION  TOPICALLY 2  TIMES DAILY 90 g 5  . Diethylpropion HCl CR 75 MG TB24 Take 1 tablet by mouth daily.    Marland Kitchen emtricitabine-rilpivir-tenofovir AF (ODEFSEY) 200-25-25 MG TABS tablet Take 1 tablet by mouth daily with breakfast. 90 tablet 5  . oxyCODONE-acetaminophen (PERCOCET) 5-325 MG tablet Take 2 tablets by mouth every 6 (six) hours as needed for severe pain. 15 tablet 0  . temazepam (RESTORIL) 15 MG capsule TAKE 1 CAPSULE BY MOUTH  EVERY NIGHT AT BEDTIME AS  NEEDED FOR SLEEP 90 capsule 0    ROS: As per HPI. Comprehensive review of systems otherwise negative.   Physical Examination: There were no vitals taken for this visit.  General: Morbidly obese HEENT-  [place]/AT  Lungs - Respirations unlabored Extremities - No edema  Neurologic Examination: Mental Status: Awake and alert. Speech with intermittent dysfluency with word-salad quality that is at times severe enough to be unintelligible. Speech output waxes and wanes, with replies being fluent about 75% of the time. Comprehension intact. One minor error with repetition. Was able to name some objects correctly, but with others gave garbled responses. Phonemic paraphasias noted intermittently. Oriented to situation, city, state, month and day of the week, but had difficulty identifying the year.  Cranial Nerves: II:   Visual fields intact. PERRL.  III,IV, VI: EOMI. No nystagmus.  V,VII: Smile symmetric, facial temp sensation equal bilaterally VIII: Hearing intact to voice IX,X: No hypophonia XI: Symmetric shoulder shrug XII: Midline tongue extension  Motor: Right : Upper extremity   5/5    Left:     Upper extremity   5/5  Lower extremity   5/5     Lower extremity   5/5 No pronator drift Sensory: Temp and light touch intact throughout, bilaterally. No extinction to DSS.  Deep Tendon Reflexes:  Normoactive x 4 Plantars: Right: downgoing  Left: downgoing Cerebellar: No ataxia with FNF and H-S bilaterally  Gait: Deferred in the context of acuity of presentation   Results for orders placed or performed during the hospital encounter of 04/30/20 (from the past 48 hour(s))  I-stat chem 8, ED     Status: Abnormal   Collection Time: 04/30/20  9:31 PM  Result Value Ref Range   Sodium 136 135 - 145 mmol/L   Potassium 4.1 3.5 - 5.1 mmol/L   Chloride 104 98 - 111 mmol/L   BUN 16 8 - 23 mg/dL   Creatinine, Ser 7.25 0.44 - 1.00 mg/dL   Glucose, Bld 366 (H) 70 - 99 mg/dL    Comment: Glucose reference range applies only to samples taken after fasting for at least 8 hours.   Calcium, Ion 1.07 (L) 1.15 - 1.40 mmol/L   TCO2 22 22 - 32 mmol/L   Hemoglobin 13.6 12.0 - 15.0 g/dL   HCT 44.0 34.7 - 42.5 %   CT HEAD CODE STROKE WO CONTRAST  Result Date: 04/30/2020 CLINICAL DATA:  Code stroke.  Facial droop, left.  Headache. EXAM: CT HEAD WITHOUT CONTRAST TECHNIQUE: Contiguous axial images were obtained from the base of the skull through the vertex without intravenous contrast. COMPARISON:  None. FINDINGS: Brain: There is no mass, hemorrhage or extra-axial collection. The size and configuration of the ventricles and extra-axial CSF spaces are normal. The brain parenchyma is normal, without evidence of acute or chronic infarction. Vascular: No abnormal hyperdensity of the major intracranial arteries or dural venous  sinuses. No intracranial atherosclerosis. Skull: The visualized skull base, calvarium and extracranial soft tissues are normal. Sinuses/Orbits: No fluid levels or advanced mucosal thickening of the visualized paranasal sinuses. No mastoid or middle ear effusion. The orbits are normal. ASPECTS [facility] Stroke Program Early CT Score) - Ganglionic level infarction (caudate, lentiform nuclei, internal capsule, insula, M1-M3 cortex): 7 - Supraganglionic infarction (M4-M6 cortex): 3 Total score (0-10 with 10 being normal): 10 IMPRESSION: 1. Normal head CT. 2. ASPECTS is 10. These results were communicated to Dr. Caryl Pina at 9:20 pm on 04/30/2020 by text page via the [facility] messaging system. Electronically Signed   By: Deatra Robinson M.D.   On: 04/30/2020 21:20    Assessment: 65 y.o. female presenting with acute onset of waxing and waning sensory aphasia  1. Exam  reveals waxing and waning sensory aphasia. No motor weakness or tactile sensory loss is noted.  2. CT head is normal 3. Stroke Risk Factors - Morbid obesity 4. After comprehensive review of possible contraindications, she has no absolute contraindications to tPA administration. Patient is a tPA candidate. Discussed extensively the risks/benefits of tPA treatment vs. no treatment with the patient, including risks of hemorrhage and death with tPA administration versus worse overall outcomes on average in patients within tPA time window who are not administered tPA. Overall benefits of tPA regarding long-term prognosis are felt to outweigh risks. The patient expressed understanding and wish to proceed with tPA. In the context of her fluctuating sensory aphasia, we were able to obtain informed consent from the patient by repeating some questions and statements several times with feedback from the patient used to determine level of understanding to a satisfactory degree.   5. CTA of head: No intracranial arterial occlusion or high-grade stenosis. 6. CTA of  neck: Mild atherosclerotic calcification at the carotid bifurcation without hemodynamically significant stenosis. Enlarged right thyroid lobe is also noted.   Plan: 1. Admitting to the Neuro ICU under the Neurology service.  2. Post-tPA order set to include frequent neuro checks and BP management.  3. No antiplatelet medications or anticoagulants for at least 24 hours following tPA.  4. DVT prophylaxis with SCDs.  5. Will need to be started on a statin.  6. Will need to be started on antiplatelet therapy if follow up CT at 24 hours is negative for hemorrhagic conversion. 7. Cardiac telemetry 8. TTE.  9. MRI brain  10. PT/OT/Speech.  11. NPO until passes swallow evaluation.  12. Fasting lipid panel, HgbA1c 13. Thyroid ultrasound (ordered)  Electronically signed: Dr. Caryl Pina 04/30/2020, 9:34 PM

## 2020-04-30 NOTE — ED Notes (Signed)
Paged attending for RN 

## 2020-04-30 NOTE — ED Provider Notes (Signed)
[facility] EMERGENCY DEPARTMENT Provider Note   CSN: [id number] Arrival date & time: 04/30/20  2102     History No chief complaint on file.   Gail Edwards is a 65 y.o. female.  The history is provided by the patient, the EMS personnel and medical records.   Gail Edwards is a 65 y.o. female who presents to the Emergency Department complaining of code stroke. Level V caveat due to confusion. History is provided by EMS. She presents the emergency department as a code stroke from church. Last known well at 715 this evening. It was noticed that she parked abnormally at church and she has been complaining of headache. She then began to have facial droop and altered speech.    Past Medical History:  Diagnosis Date  . Abnormal Pap smear 2009   lsil-cin1  . HIV infection (HCC)   . Pre-diabetes     Patient Active Problem List   Diagnosis Date Noted  . Alopecia 01/04/2018  . Bruising 01/04/2018  . Hyperlipidemia 12/06/2012  . Elevated blood sugar 05/29/2012  . Insomnia 05/29/2012  . HTN (hypertension) 11/25/2011  . PAP SMER CERV W/LW GRADE SQUAMOUS INTRAEPITH LES 06/20/2007  . PSORIASIS NEC 11/28/2006  . BACK PAIN 11/25/2005  . Human immunodeficiency virus (HIV) disease (HCC) 11/24/2005    Past Surgical History:  Procedure Laterality Date  . CESAREAN SECTION    . CHOLECYSTECTOMY    . HERNIA REPAIR    . ORIF HUMERUS FRACTURE Right 12/17/2016   Procedure: OPEN REDUCTION INTERNAL FIXATION (ORIF) RIGHT PROXIMAL HUMERUS FRACTURE;  Surgeon: Sheral Apley, MD;  Location: [facility];  Service: Orthopedics;  Laterality: Right;     OB History    Gravida  2   Para  1   Term  1   Preterm      AB  1   Living  1     SAB  1   IAB      Ectopic      Multiple      Live Births              Family History  Problem Relation Age of Onset  . Diabetes Mother   . Cancer Father        brain  . Breast cancer Neg Hx     Social  History   Tobacco Use  . Smoking status: Never Smoker  . Smokeless tobacco: Never Used  Vaping Use  . Vaping Use: Never used  Substance Use Topics  . Alcohol use: No  . Drug use: No    Home Medications Prior to Admission medications   Medication Sig Start Date End Date Taking? Authorizing Provider  calcium carbonate (TUMS) 500 MG chewable tablet Chew 1,500 mg by mouth daily.    [provider]  cholecalciferol (VITAMIN D3) 25 MCG (1000 UNIT) tablet Take 2,000 Units by mouth daily.    [provider]  clobetasol ointment (TEMOVATE) 0.05 % APPLY 1 APPLICATION  TOPICALLY 2  TIMES DAILY 01/29/20   Ginnie Smart, MD  Diethylpropion HCl CR 75 MG TB24 Take 1 tablet by mouth daily. 01/02/20   [provider]  emtricitabine-rilpivir-tenofovir AF (ODEFSEY) 200-25-25 MG TABS tablet Take 1 tablet by mouth daily with breakfast. 01/22/20   Ginnie Smart, MD  oxyCODONE-acetaminophen (PERCOCET) 5-325 MG tablet Take 2 tablets by mouth every 6 (six) hours as needed for severe pain. 08/16/19   Little, Ambrose Finland, MD  temazepam (  RESTORIL) 15 MG capsule TAKE 1 CAPSULE BY MOUTH  EVERY NIGHT AT BEDTIME AS  NEEDED FOR SLEEP 04/02/20   Ginnie Smart, MD    Allergies    Codeine  Review of Systems   Review of Systems  All other systems reviewed and are negative.   Physical Exam Updated Vital Signs There were no vitals taken for this visit.  Physical Exam Vitals and nursing note reviewed.  Constitutional:      Appearance: She is well-developed and well-nourished.  HENT:     Head: Normocephalic and atraumatic.  Cardiovascular:     Rate and Rhythm: Normal rate and regular rhythm.     Heart sounds: No murmur heard.   Pulmonary:     Effort: Pulmonary effort is normal. No respiratory distress.     Breath sounds: Normal breath sounds.  Abdominal:     Palpations: Abdomen is soft.     Tenderness: There is no abdominal tenderness. There is no guarding or  rebound.  Musculoskeletal:        General: No swelling, tenderness or edema.     Comments: 2+ DP pulses bilaterally  Skin:    General: Skin is warm and dry.  Neurological:     Mental Status: She is alert and oriented to person, place, and time.     Comments: Expressive aphasia. MAE symmetrically  Psychiatric:        Mood and Affect: Mood and affect normal.        Behavior: Behavior normal.     ED Results / Procedures / Treatments   Labs (all labs ordered are listed, but only abnormal results are displayed) Labs Reviewed  I-STAT CHEM 8, ED - Abnormal; Notable for the following components:      Result Value   Glucose, Bld 106 (*)    Calcium, Ion 1.07 (*)    All other components within normal limits  PROTIME-INR  APTT  CBC  DIFFERENTIAL  COMPREHENSIVE METABOLIC PANEL  CBG MONITORING, ED    EKG None  Radiology CT HEAD CODE STROKE WO CONTRAST  Result Date: 04/30/2020 CLINICAL DATA:  Code stroke.  Facial droop, left.  Headache. EXAM: CT HEAD WITHOUT CONTRAST TECHNIQUE: Contiguous axial images were obtained from the base of the skull through the vertex without intravenous contrast. COMPARISON:  None. FINDINGS: Brain: There is no mass, hemorrhage or extra-axial collection. The size and configuration of the ventricles and extra-axial CSF spaces are normal. The brain parenchyma is normal, without evidence of acute or chronic infarction. Vascular: No abnormal hyperdensity of the major intracranial arteries or dural venous sinuses. No intracranial atherosclerosis. Skull: The visualized skull base, calvarium and extracranial soft tissues are normal. Sinuses/Orbits: No fluid levels or advanced mucosal thickening of the visualized paranasal sinuses. No mastoid or middle ear effusion. The orbits are normal. ASPECTS [facility] Stroke Program Early CT Score) - Ganglionic level infarction (caudate, lentiform nuclei, internal capsule, insula, M1-M3 cortex): 7 - Supraganglionic infarction (M4-M6  cortex): 3 Total score (0-10 with 10 being normal): 10 IMPRESSION: 1. Normal head CT. 2. ASPECTS is 10. These results were communicated to Dr. Caryl Pina at 9:20 pm on 04/30/2020 by text page via the [facility] messaging system. Electronically Signed   By: Deatra Robinson M.D.   On: 04/30/2020 21:20    Procedures Procedures   Medications Ordered in ED Medications  sodium chloride flush (NS) 0.9 % injection 3 mL (has no administration in time range)    ED Course  I have reviewed the triage  vital signs and the nursing notes.  Pertinent labs & imaging results that were available during my care of the patient were reviewed by me and considered in my medical decision making (see chart for details).    MDM Rules/Calculators/A&P                         Patient presented to the emergency department as a code stroke. She was evaluated by neurology on ED presentation. She has expressive aphasia on assessment. Her airway is intact. She was treated with a TPA for acute CVA. Plan to admit to neurology service for ongoing treatment.  Final Clinical Impression(s) / ED Diagnoses Final diagnoses:  None    Rx / DC Orders ED Discharge Orders    None       Tilden Fossa, MD 05/01/20 0008

## 2020-04-30 NOTE — Telephone Encounter (Signed)
Pt needs refills on Rx.

## 2020-04-30 NOTE — Code Documentation (Signed)
Responded to Code Stroke called at 2046 for headache, L facial droop/L sided weakness, and aphasia, [id number]. Pt arrived at 2102, NIH-2 for LOC questions and mild aphasia, CBG-106, CT head negative for acute changes. TPA started at 2131. CTA-no LVO. Plan to admit to ICU.

## 2020-04-30 NOTE — Progress Notes (Signed)
PHARMACIST CODE STROKE RESPONSE  Notified to mix tPA at 2124 by Dr. Otelia Limes Delivered tPA to RN at 2127  tPA dose = 9mg  bolus over 1 minute followed by 81mg  for a total dose of 90mg  over 1 hour  Issues/delays encountered (if applicable):   PharmD. BCPS  04/30/20 9:35 PM

## 2020-04-30 NOTE — ED Triage Notes (Signed)
Pt BIB by EMS as a Code Stroke. PT began developing a HA around 1630.  It worsened around 1930 while at church.  Church members began noticing her holding her head, covering her eyes and leaning over a little.  When they tried to help her out to her car they noticed she had parked very crooked in the space and was trying to use her house keys to access the car and start the car.  They called 911.   EMS noted intermittent expressive aphasia w/ accompanying left sided facial droop that would resolve with the aphasia.

## 2020-05-01 ENCOUNTER — Inpatient Hospital Stay (HOSPITAL_COMMUNITY): Payer: 59

## 2020-05-01 DIAGNOSIS — G9341 Metabolic encephalopathy: Secondary | ICD-10-CM | POA: Diagnosis not present

## 2020-05-01 DIAGNOSIS — I6389 Other cerebral infarction: Secondary | ICD-10-CM | POA: Diagnosis not present

## 2020-05-01 DIAGNOSIS — J9601 Acute respiratory failure with hypoxia: Secondary | ICD-10-CM

## 2020-05-01 LAB — GLUCOSE, CAPILLARY
Glucose-Capillary: 159 mg/dL — ABNORMAL HIGH (ref 70–99)
Glucose-Capillary: 184 mg/dL — ABNORMAL HIGH (ref 70–99)

## 2020-05-01 LAB — URINALYSIS, ROUTINE W REFLEX MICROSCOPIC
Bilirubin Urine: NEGATIVE
Glucose, UA: 150 mg/dL — AB
Ketones, ur: 80 mg/dL — AB
Nitrite: POSITIVE — AB
Protein, ur: NEGATIVE mg/dL
Specific Gravity, Urine: 1.017 (ref 1.005–1.030)
WBC, UA: >50 WBC/hpf — ABNORMAL HIGH (ref 0–5)
pH: 6 (ref 5.0–8.0)

## 2020-05-01 LAB — LIPID PANEL
Cholesterol: 133 mg/dL (ref 0–200)
HDL: 46 mg/dL (ref >40)
LDL Cholesterol: 82 mg/dL (ref 0–99)
Total CHOL/HDL Ratio: 2.9 RATIO
Triglycerides: 27 mg/dL (ref <150)
VLDL: 5 mg/dL (ref 0–40)

## 2020-05-01 LAB — POCT I-STAT 7, (LYTES, BLD GAS, ICA,H+H)
Acid-base deficit: 5 mmol/L — ABNORMAL HIGH (ref 0.0–2.0)
Acid-base deficit: 5 mmol/L — ABNORMAL HIGH (ref 0.0–2.0)
Bicarbonate: 16.5 mmol/L — ABNORMAL LOW (ref 20.0–28.0)
Bicarbonate: 20.3 mmol/L (ref 20.0–28.0)
Calcium, Ion: 1.2 mmol/L (ref 1.15–1.40)
Calcium, Ion: 1.26 mmol/L (ref 1.15–1.40)
HCT: 39 % (ref 36.0–46.0)
HCT: 36 % (ref 36.0–46.0)
Hemoglobin: 13.3 g/dL (ref 12.0–15.0)
Hemoglobin: 12.2 g/dL (ref 12.0–15.0)
O2 Saturation: 96 %
O2 Saturation: 99 %
Patient temperature: 98.8
Potassium: 3.6 mmol/L (ref 3.5–5.1)
Potassium: 3.2 mmol/L — ABNORMAL LOW (ref 3.5–5.1)
Sodium: 137 mmol/L (ref 135–145)
Sodium: 135 mmol/L (ref 135–145)
TCO2: 17 mmol/L — ABNORMAL LOW (ref 22–32)
TCO2: 21 mmol/L — ABNORMAL LOW (ref 22–32)
pCO2 arterial: 23.1 mmHg — ABNORMAL LOW (ref 32.0–48.0)
pCO2 arterial: 36 mmHg (ref 32.0–48.0)
pH, Arterial: 7.462 — ABNORMAL HIGH (ref 7.350–7.450)
pH, Arterial: 7.36 (ref 7.350–7.450)
pO2, Arterial: 76 mmHg — ABNORMAL LOW (ref 83.0–108.0)
pO2, Arterial: 136 mmHg — ABNORMAL HIGH (ref 83.0–108.0)

## 2020-05-01 LAB — HEMOGLOBIN A1C
Hgb A1c MFr Bld: 5.7 % — ABNORMAL HIGH (ref 4.8–5.6)
Mean Plasma Glucose: 116.89 mg/dL

## 2020-05-01 LAB — ECHOCARDIOGRAM COMPLETE
AR max vel: 2.62 cm2
AV Area VTI: 2.45 cm2
AV Area mean vel: 2.4 cm2
AV Mean grad: 7 mmHg
AV Peak grad: 12.5 mmHg
Ao pk vel: 1.77 m/s
Area-P 1/2: 3.27 cm2
Height: 66 in
MV VTI: 2.31 cm2
S' Lateral: 2.8 cm
Weight: 3714.31 oz

## 2020-05-01 LAB — PHOSPHORUS
Phosphorus: 1.9 mg/dL — ABNORMAL LOW (ref 2.5–4.6)
Phosphorus: 2.8 mg/dL (ref 2.5–4.6)

## 2020-05-01 LAB — SARS CORONAVIRUS 2 (TAT 6-24 HRS): SARS Coronavirus 2: NEGATIVE

## 2020-05-01 LAB — AMMONIA: Ammonia: 33 umol/L (ref 9–35)

## 2020-05-01 LAB — MAGNESIUM
Magnesium: 1.8 mg/dL (ref 1.7–2.4)
Magnesium: 1.8 mg/dL (ref 1.7–2.4)

## 2020-05-01 LAB — MRSA PCR SCREENING: MRSA by PCR: NEGATIVE

## 2020-05-01 IMAGING — DX DG CHEST 1V PORT
1 series · 2 of 2 positions shown · non-contrast
Comparison: [DATE]

CLINICAL DATA: Hump of adipose tissue on her upper back.

EXAM:
PORTABLE CHEST 1 VIEW

[Series 1: chest ap · 0.14mm/px · 2 of 2 slices shown]
[im 1/2]
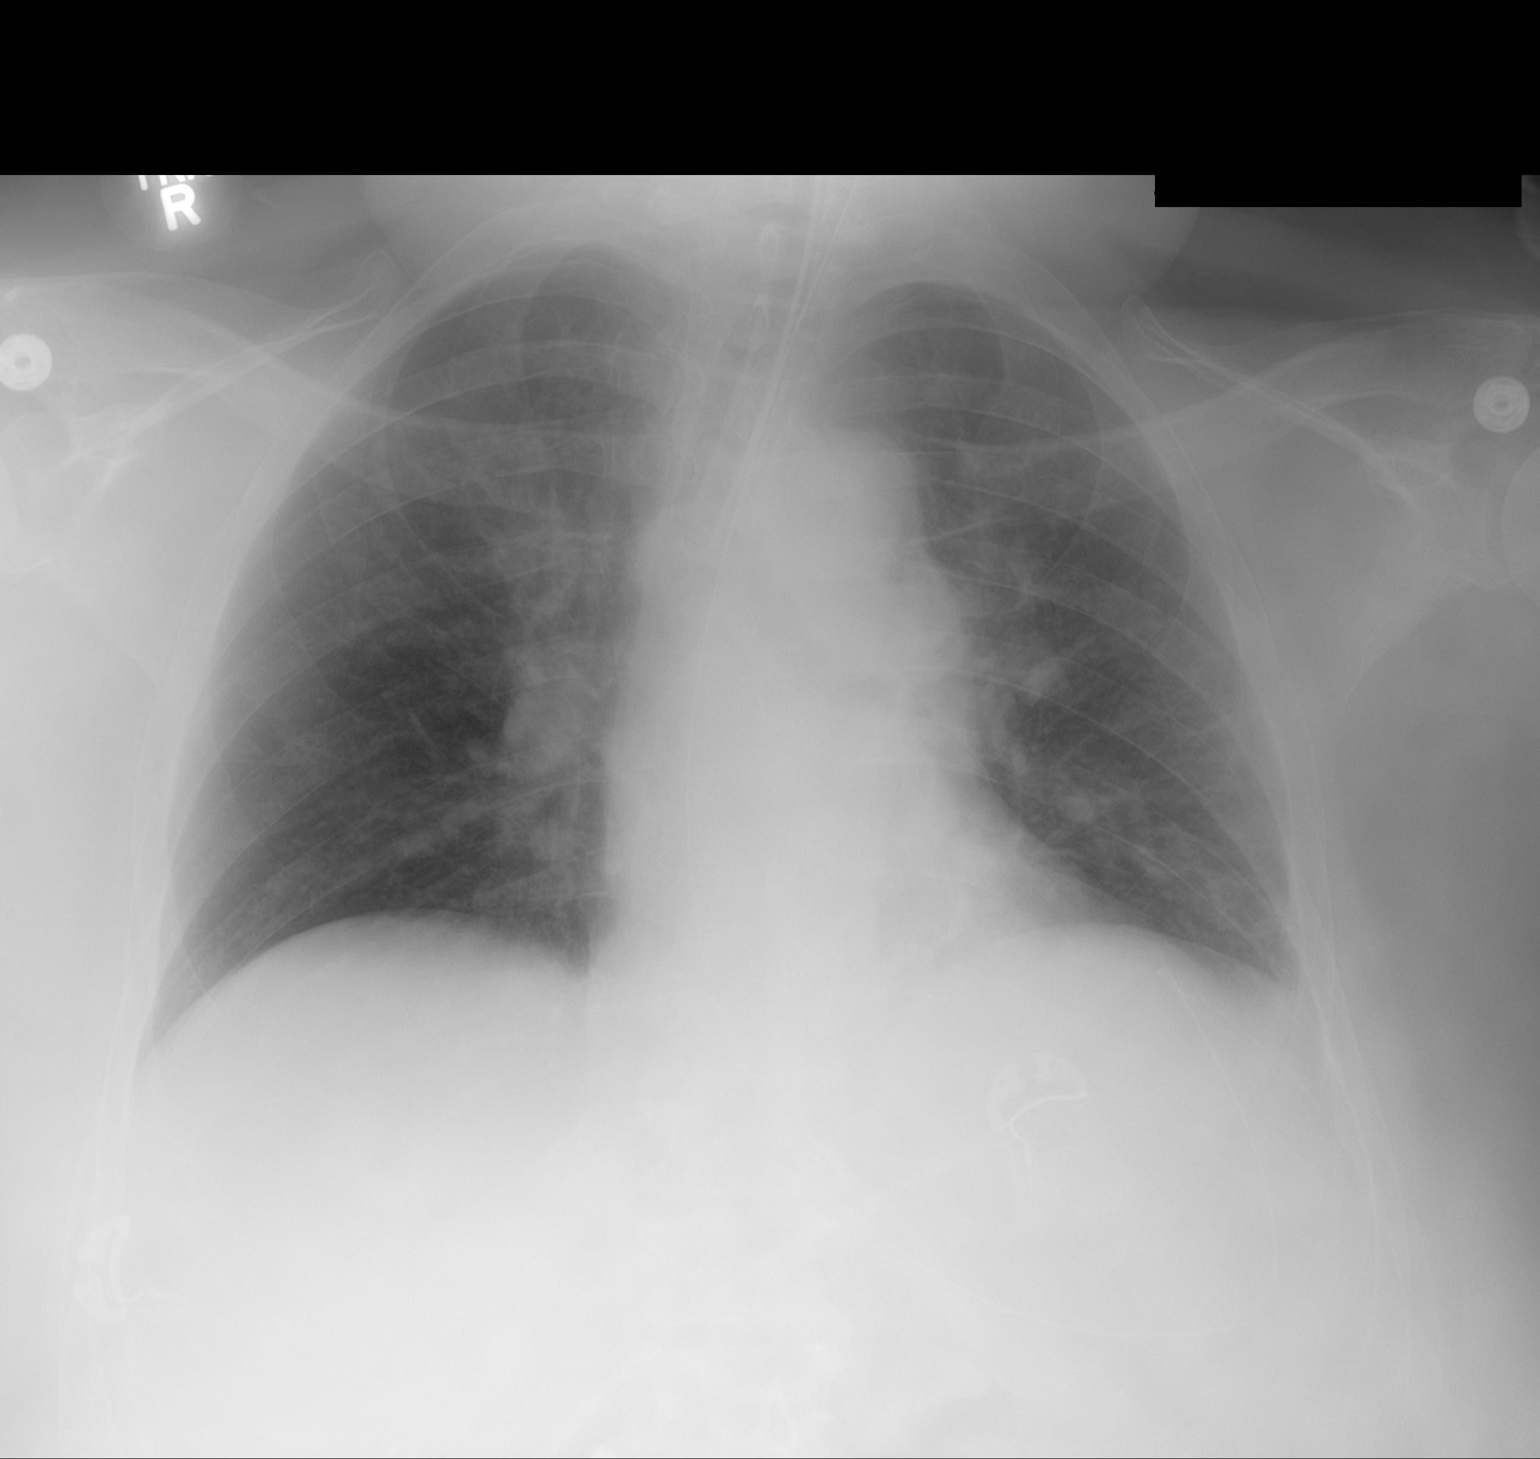
[im 2/2]
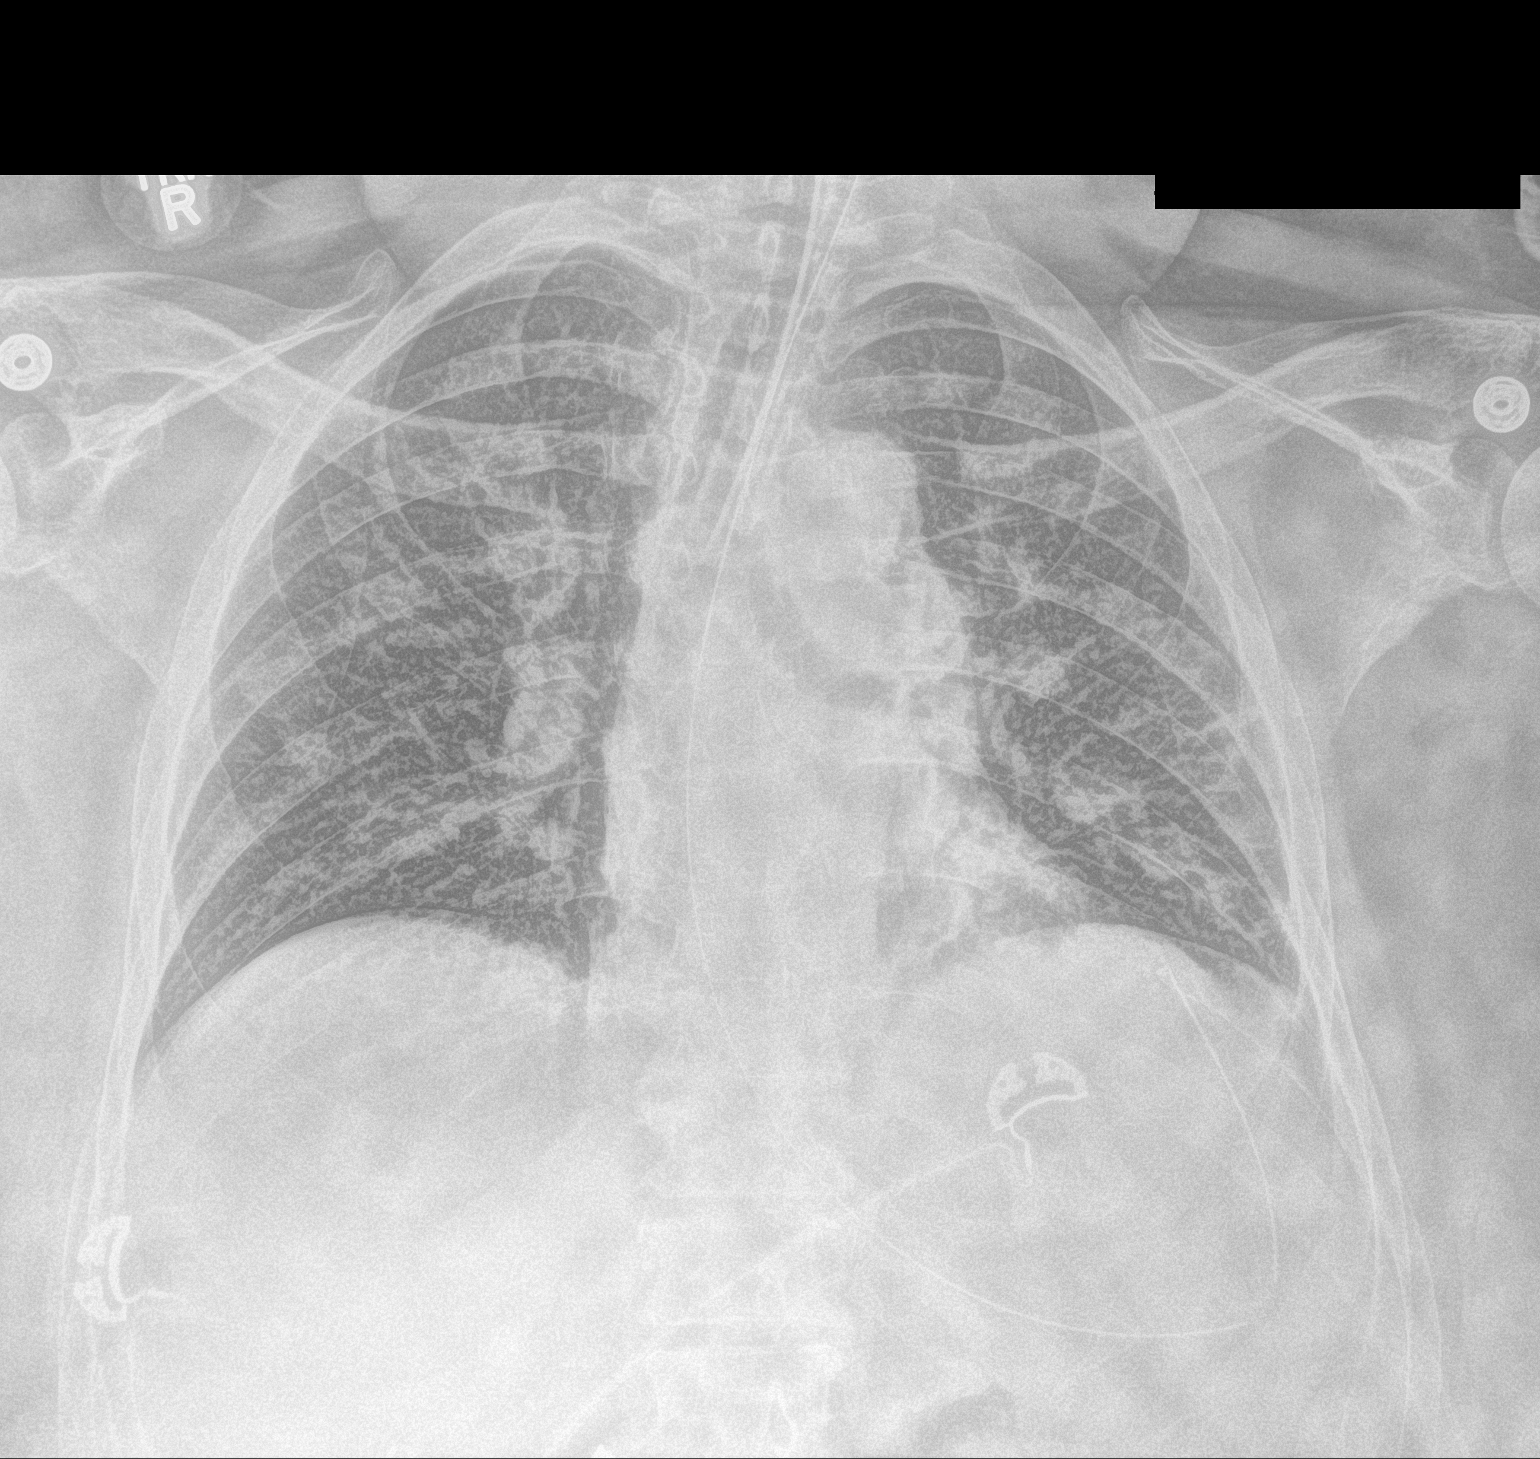

[2 of 2 positions shown; findings below may reference images not displayed]

FINDINGS: An endotracheal tube is seen with its distal tip approximately
cm from the carina. A nasogastric tube is noted with its distal tip
overlying the expected region of the gastric fundus. Mild, chronic
appearing increased lung markings are seen with mild areas of
atelectasis noted along the lateral aspect of the left lung base.
There is no evidence of a pleural effusion or pneumothorax.
Perihilar prominence of the pulmonary vasculature is seen. The heart
size and mediastinal contours are within normal limits. Degenerative
changes are seen throughout the thoracic spine.
IMPRESSION: 1. Endotracheal tube and nasogastric tube in appropriate position.
2. Mild pulmonary vascular congestion with mild left basilar
atelectasis.

## 2020-05-01 IMAGING — CT CT HEAD W/O CM
3 of 4 series · 14 of 47 positions shown, 16 images · non-contrast
Comparison: [DATE]

CLINICAL DATA: Status post tPA administration for CVA. Frontal
headache.

EXAM:
CT HEAD WITHOUT CONTRAST
TECHNIQUE: Contiguous axial images were obtained from the base of the skull
through the vertex without intravenous contrast.

[Series 4: head 2.0 h70h · axial · 0.52mm/px · z∈[-148,-18]mm · 8 of 81 slices shown, 10 images]
[im 8/81  brain]
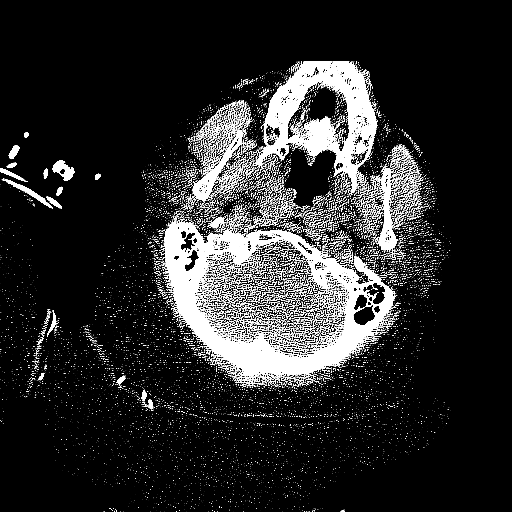
[im 8/81  bone]
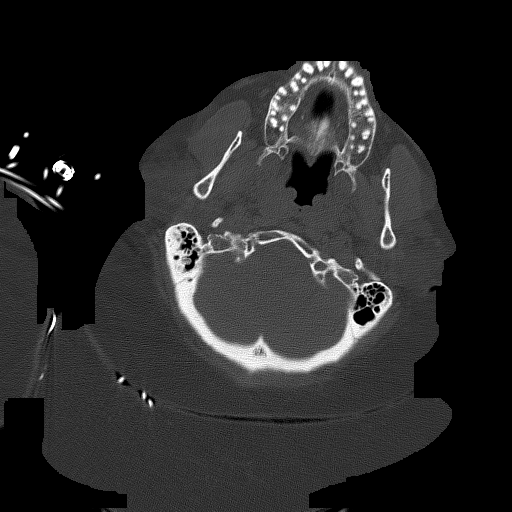
[im 16/81  brain]
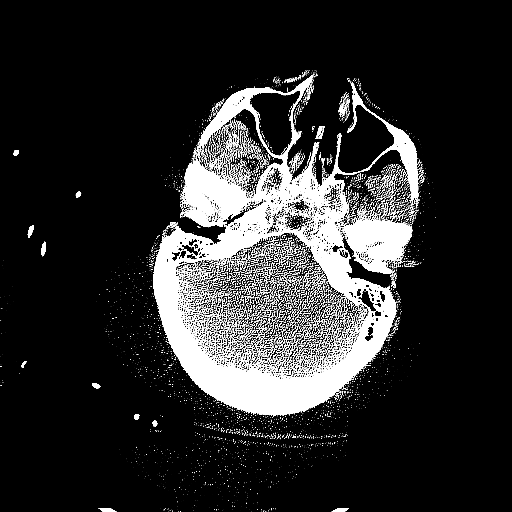
[im 27/81  brain]
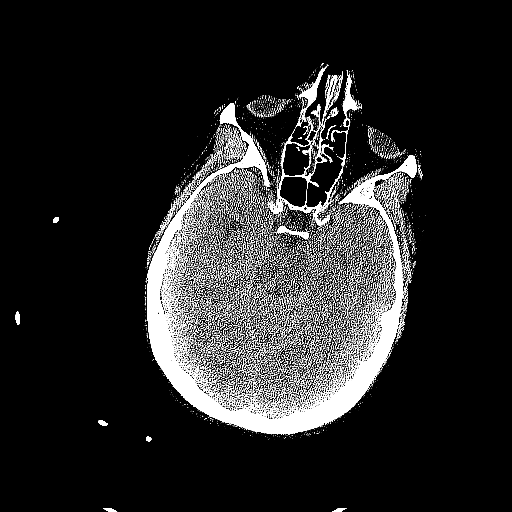
[im 35/81  brain]
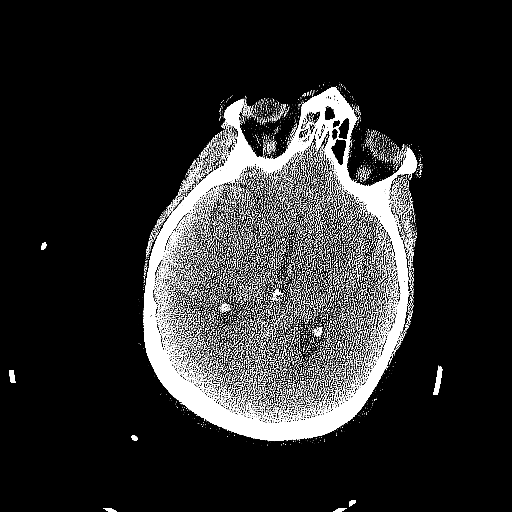
[im 46/81  brain]
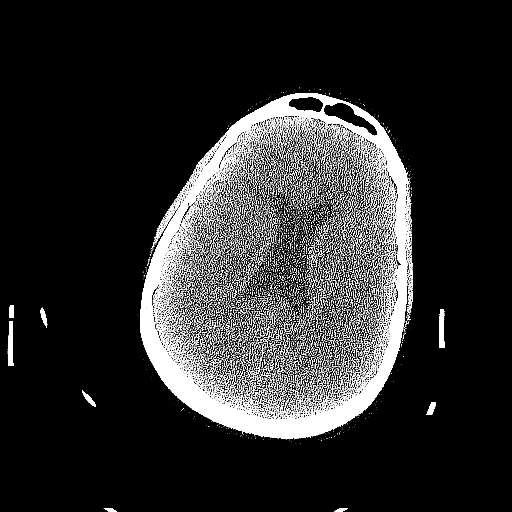
[im 46/81  bone]
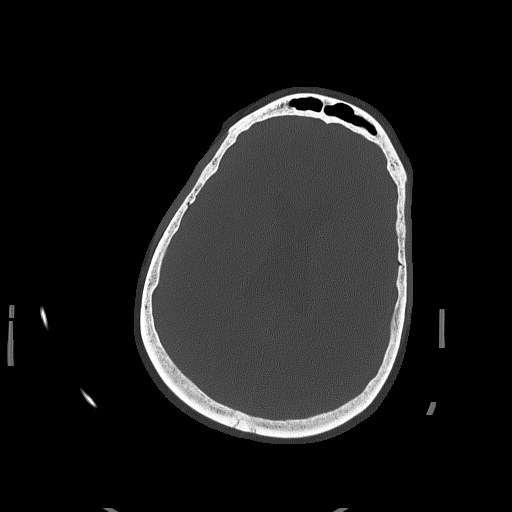
[im 54/81  brain]
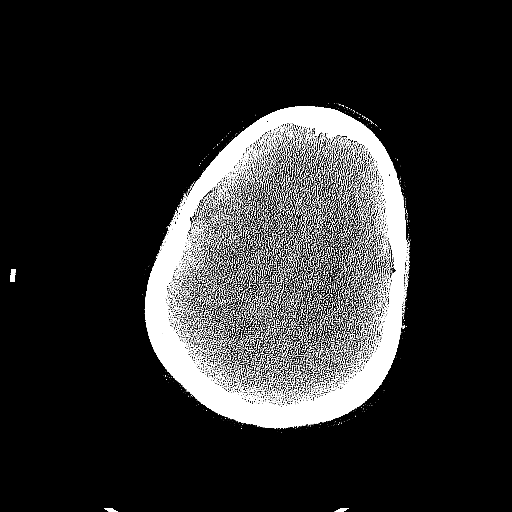
[im 65/81  brain]
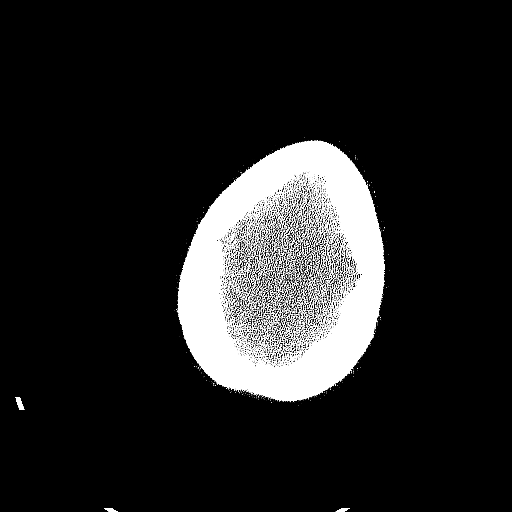
[im 73/81  brain]
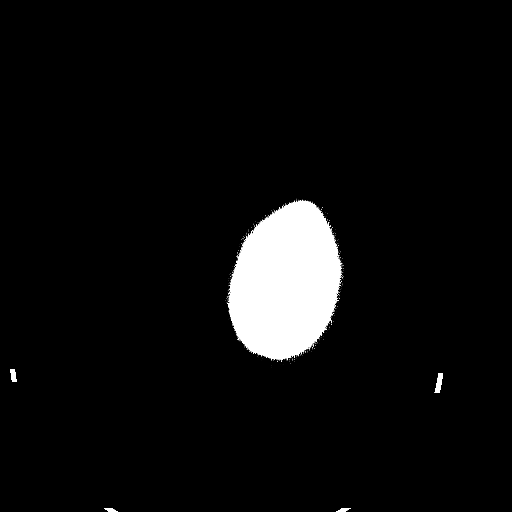

[Series 5: head 3.0 mpr cor · coronal · 0.32mm/px · 3 of 77 slices shown]
[im 26/77  brain]
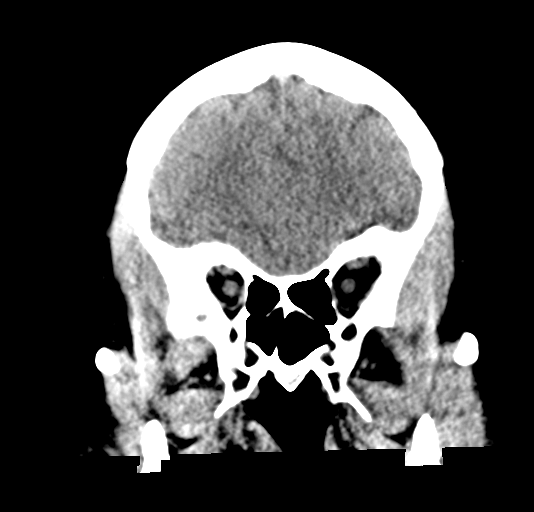
[im 34/77  brain]
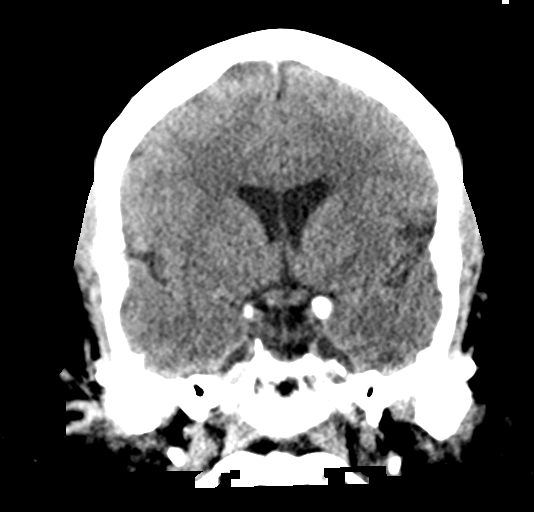
[im 43/77  brain]
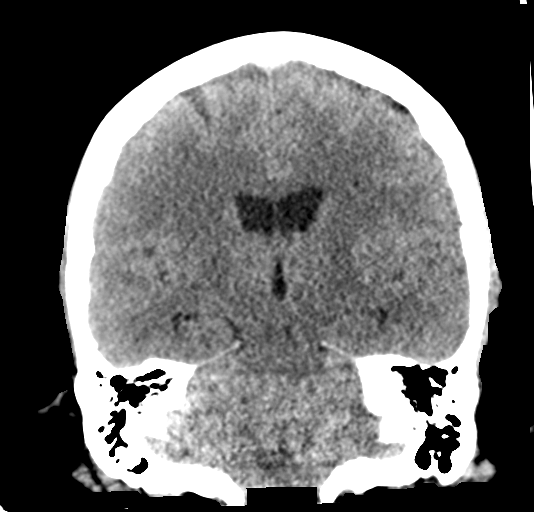

[Series 6: head 3.0 mpr sag · sagittal · 0.31mm/px · 3 of 67 slices shown]
[im 23/67  brain]
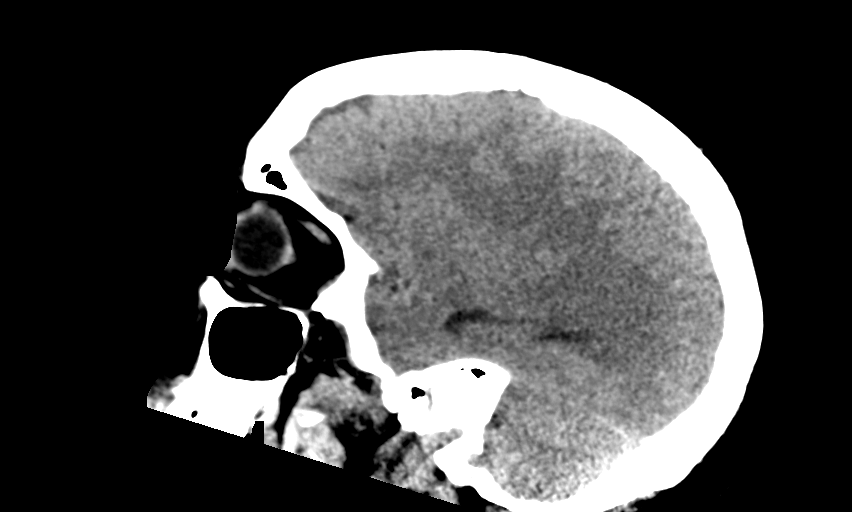
[im 34/67  brain]
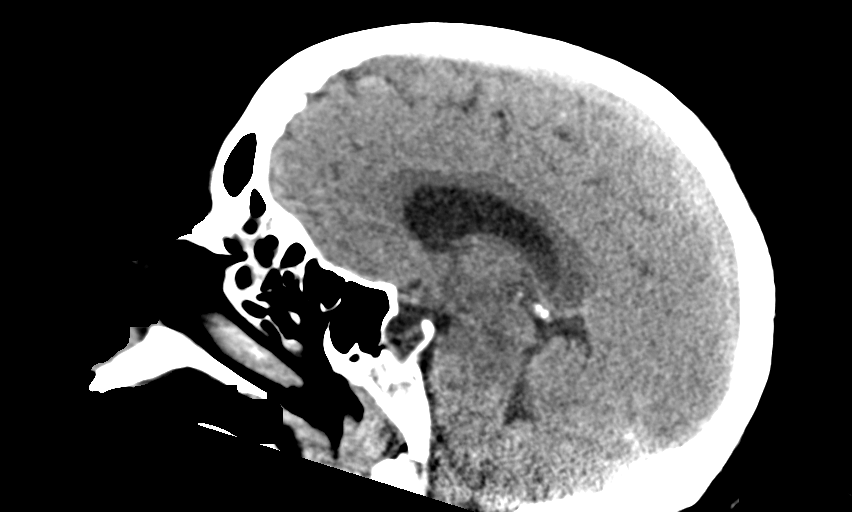
[im 45/67  brain]
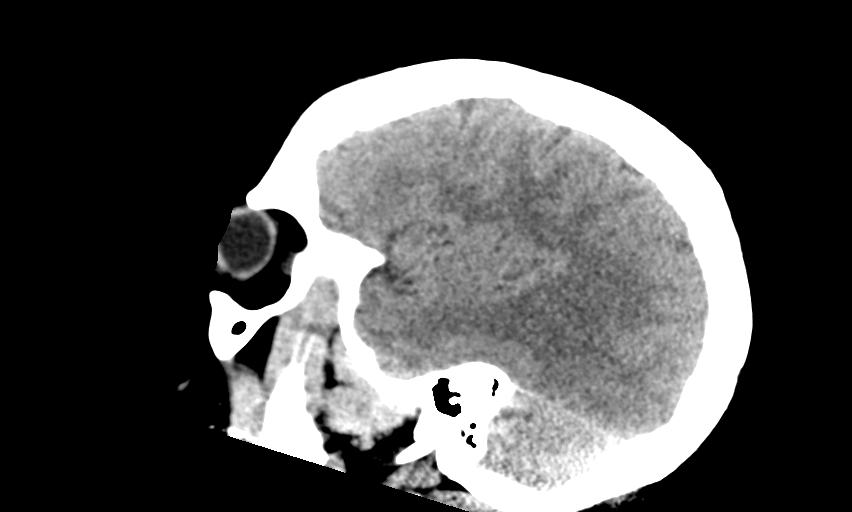

[14 of 47 positions shown; findings below may reference images not displayed]

FINDINGS: Brain: Ventricles and sulci are normal in size and configuration.
There is stable invagination of CSF into the sella. There is no
intracranial mass, hemorrhage, extra-axial fluid collection, or
midline shift. Brain parenchyma appears unremarkable. No acute
infarct is demonstrable.

Vascular: No hyperdense vessel. No appreciable vascular
calcification.

Skull: Bony calvarium appears intact.

Sinuses/Orbits: There is mucosal thickening in the right maxillary
antrum. There is mucosal thickening and opacification in several
ethmoid air cells. Orbits appear symmetric bilaterally.

Other: Mastoid air cells are clear.
IMPRESSION: 1. Normal appearing brain parenchyma. No acute infarct evident. No
mass or hemorrhage. There is a degree of empty sella, a stable
finding of questionable significance.

2.  There are foci of paranasal sinus disease.

## 2020-05-01 IMAGING — MR MR HEAD W/O CM
9 of 10 series · 40 of 48 positions shown · non-contrast
Comparison: Head CT [DATE]

CLINICAL DATA: Stroke follow-up.

EXAM:
MRI HEAD WITHOUT CONTRAST
TECHNIQUE: Multiplanar, multiecho pulse sequences of the brain and surrounding
structures were obtained without intravenous contrast.

[Series 3: DWI · axial · 3.0mm · 1.09mm/px · z∈[-100,+37]mm · 9 of 94 slices shown (1 of 4)]
[im 1/94]
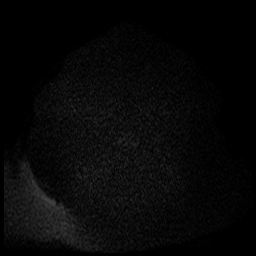
[im 17/94]
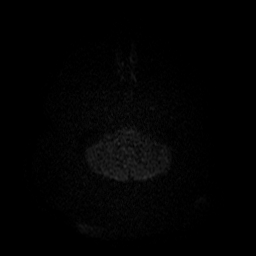
[im 26/94]
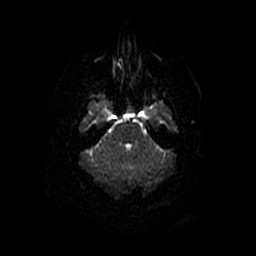
[im 43/94]
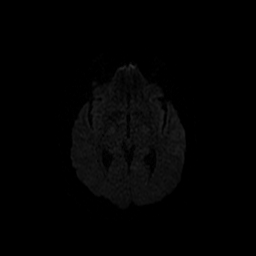
[im 51/94]
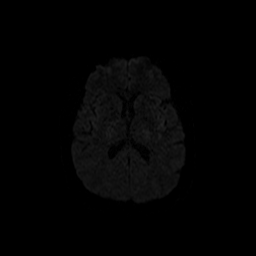
[im 68/94]
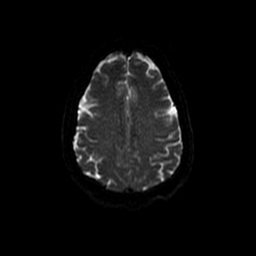
[im 77/94]
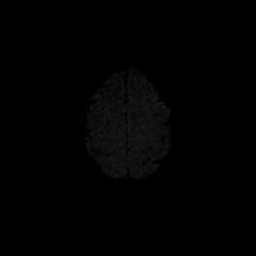
[im 85/94]
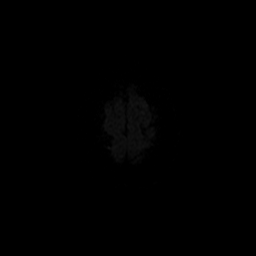
[im 94/94]
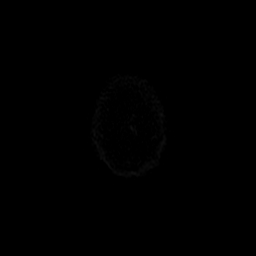

[Series 4: DWI · coronal · 5.0mm · 1.09mm/px · 8 of 66 slices shown (2 of 4)]
[im 1/66]
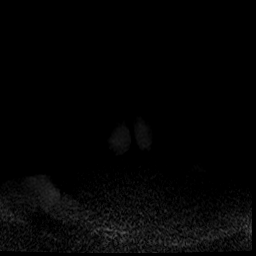
[im 10/66]
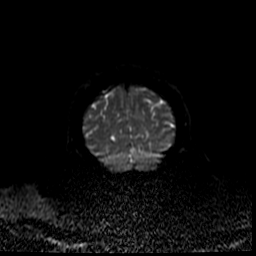
[im 19/66]
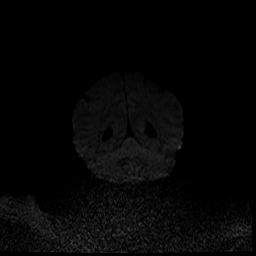
[im 28/66]
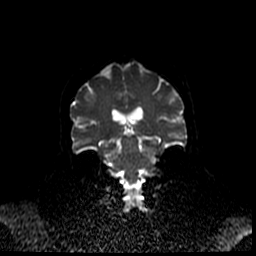
[im 38/66]
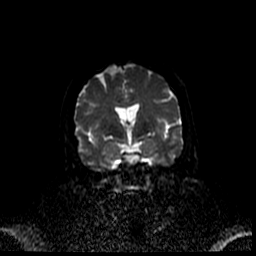
[im 47/66]
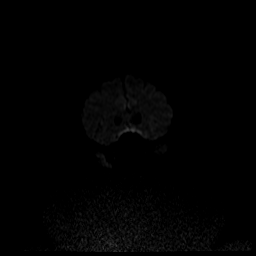
[im 56/66]
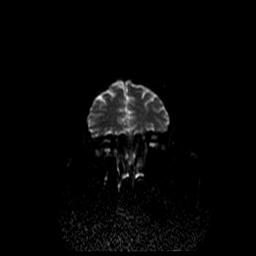
[im 66/66]
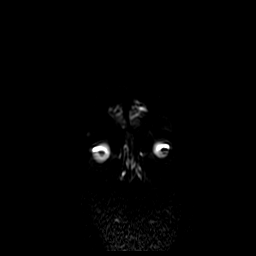

[Series 5: T1 · sagittal · 5.0mm · 0.47mm/px · 1 of 24 slices shown]
[im 1/24]
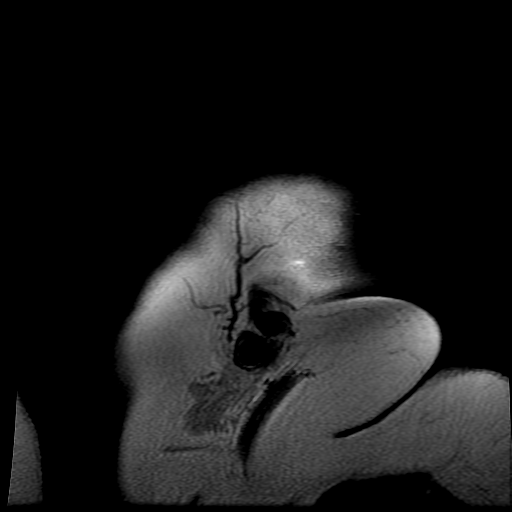

[Series 6: T2 · axial · 5.0mm · 0.43mm/px · z∈[-96,+40]mm · 3 of 24 slices shown (1 of 2)]
[im 1/24]
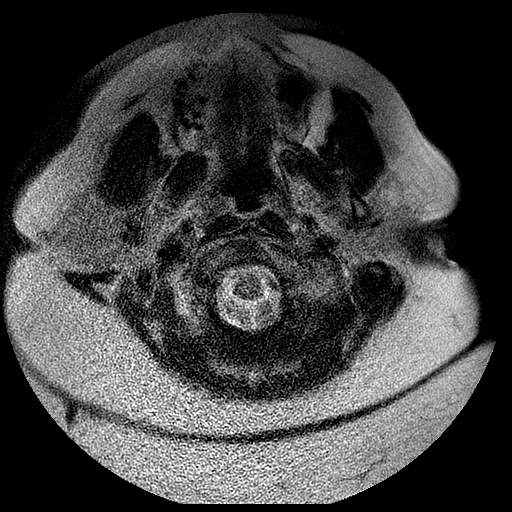
[im 12/24]
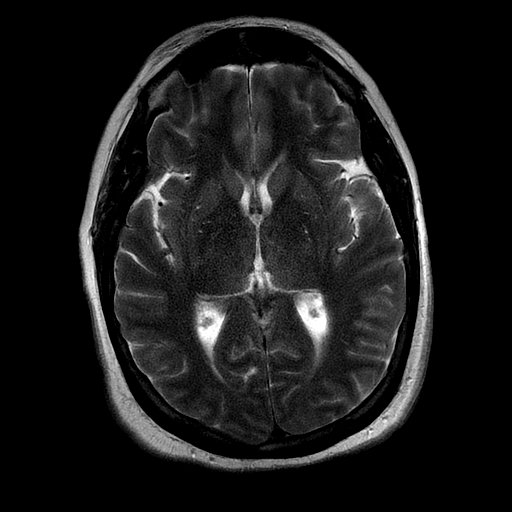
[im 24/24]
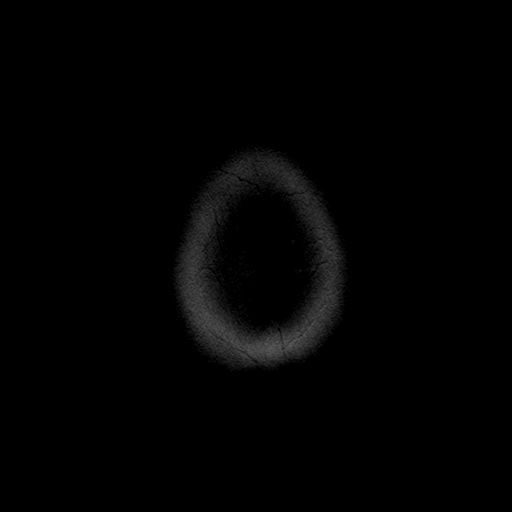

[Series 7: FLAIR · axial · 5.0mm · 0.43mm/px · z∈[-96,+40]mm · 3 of 24 slices shown (1 of 2)]
[im 1/24]
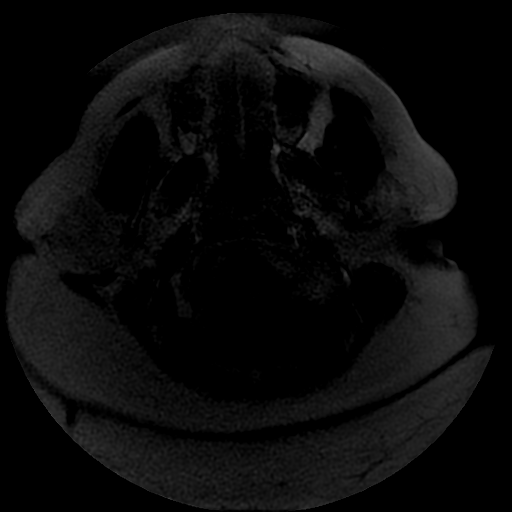
[im 12/24]
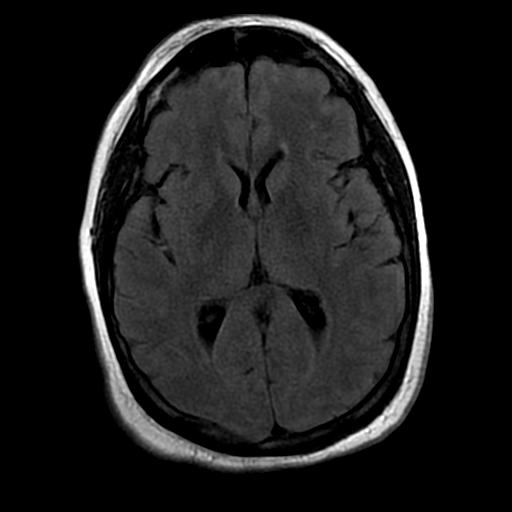
[im 24/24]
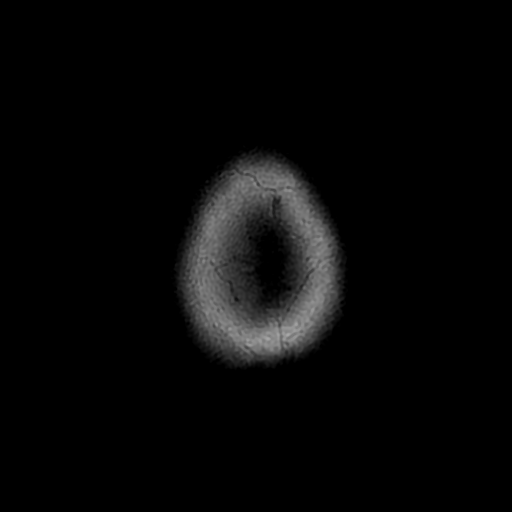

[Series 10: T2 · coronal · 5.0mm · 0.43mm/px · 3 of 26 slices shown (2 of 2)]
[im 1/26]
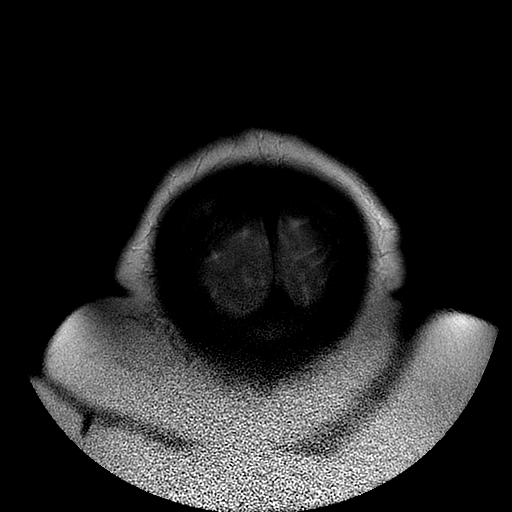
[im 13/26]
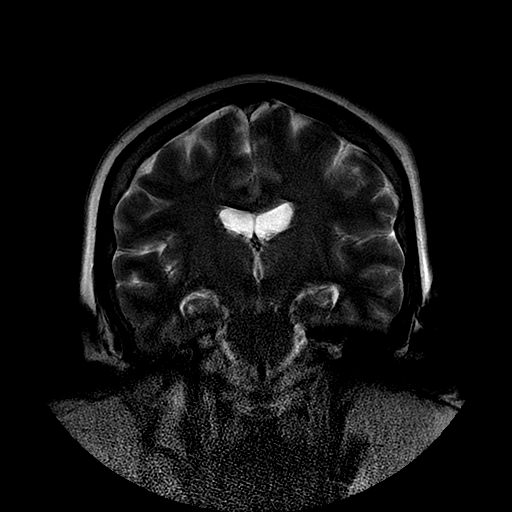
[im 26/26]
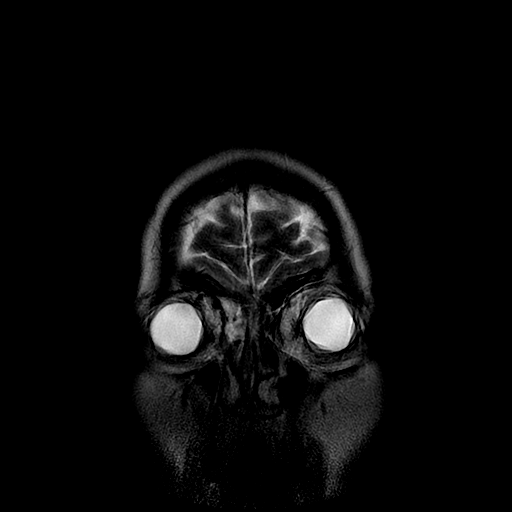

[Series 11: FLAIR · axial · 5.0mm · 0.86mm/px · z∈[-96,+40]mm · 3 of 24 slices shown (2 of 2)]
[im 1/24]
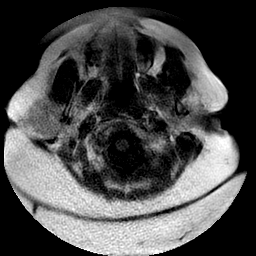
[im 12/24]
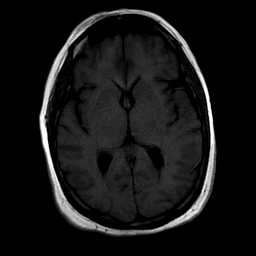
[im 24/24]
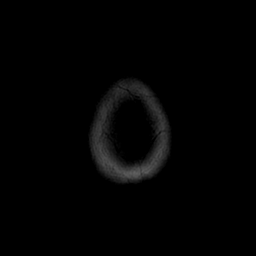

[Series 300: DWI · axial · 3.0mm · 1.09mm/px · z∈[-100,+37]mm · 6 of 47 slices shown (3 of 4)]
[im 1/47]
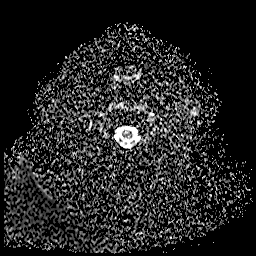
[im 10/47]
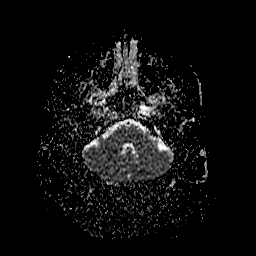
[im 19/47]
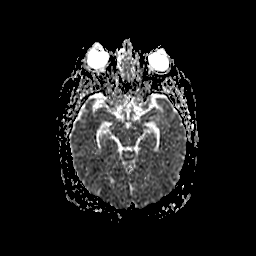
[im 28/47]
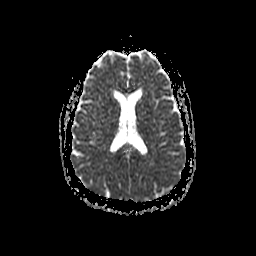
[im 37/47]
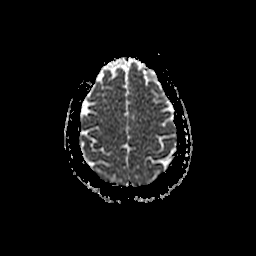
[im 47/47]
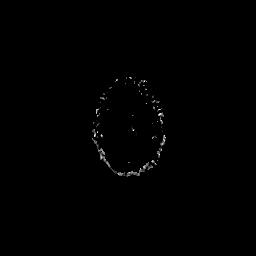

[Series 400: DWI · coronal · 5.0mm · 1.09mm/px · 4 of 33 slices shown (4 of 4)]
[im 1/33]
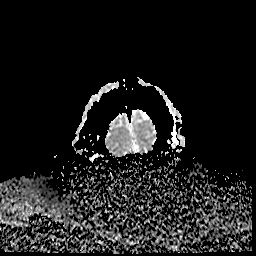
[im 11/33]
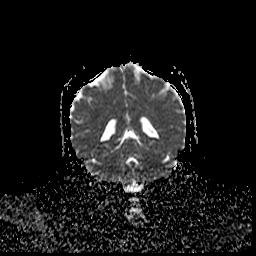
[im 22/33]
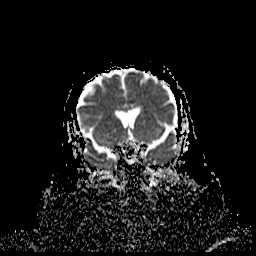
[im 33/33]
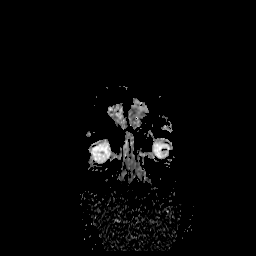

[40 of 48 positions shown; findings below may reference images not displayed]

FINDINGS: Brain: No acute infarction, hydrocephalus or mass lesion.

Fluid level within the occipital horn of the bilateral lateral
ventricles with low signal on T2 and associated restricted
diffusion. There is also hyperintensity of the cerebral sulci in the
parietooccipital region.

Vascular: Normal flow voids.

Skull and upper cervical spine: Cervical spine is obscured by
artifact. Normal marrow signal in the calvarium.

Sinuses/Orbits: Mucosal thickening of the right maxillary sinus and
bilateral ethmoid cells.
IMPRESSION: Fluid level within the occipital horn of the bilateral lateral
ventricles with low signal on T2 and associated restricted
diffusion. Findings are suggestive of small intraventricular
hemorrhage. However, the possibility of ventriculitis CT be
excluded. CSF sampling may be helpful for further evaluation.

These results were called by telephone at the time of interpretation
on [DATE] at [DATE] to provider SAMANA , who verbally
acknowledged these results.

## 2020-05-01 IMAGING — CT CT HEAD W/O CM
4 series · 16 of 47 positions shown, 18 images · non-contrast
Comparison: [DATE] [DATE], [DATE] ([DATE] a.m.)
COMPARISON: [DATE] [DATE], [DATE] ([DATE] a.m.)

Addendum:
CLINICAL DATA: Stroke follow-up.

EXAM:
CT HEAD WITHOUT CONTRAST
TECHNIQUE: Contiguous axial images were obtained from the base of the skull
through the vertex without intravenous contrast.

[Series 3: head (person_name) · axial · 0.43mm/px · z∈[-143,-28]mm · 6 of 33 slices shown, 8 images]
[im 5/33  brain]
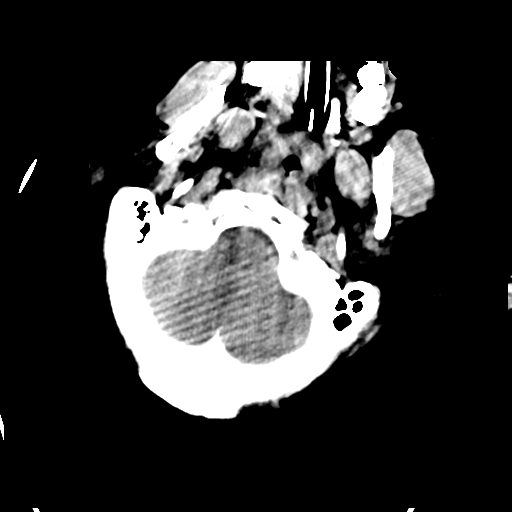
[im 5/33  bone]
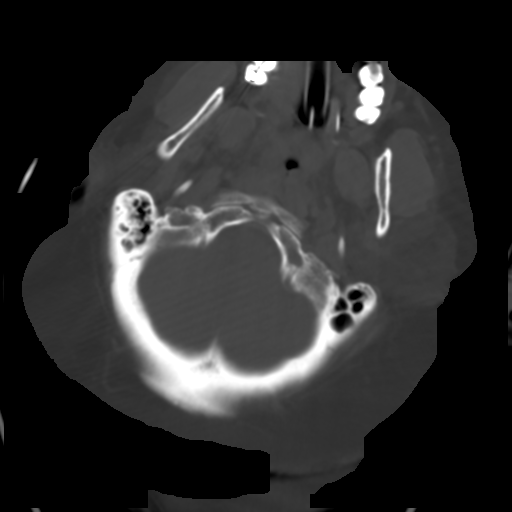
[im 10/33  brain]
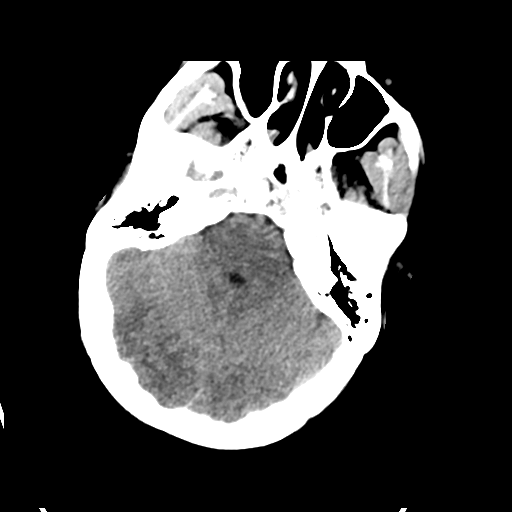
[im 14/33  brain]
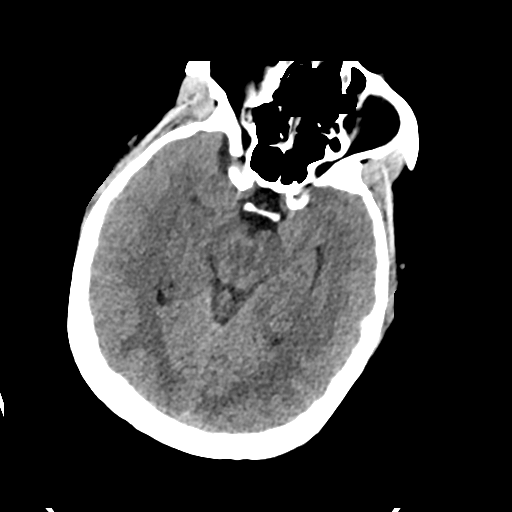
[im 19/33  brain]
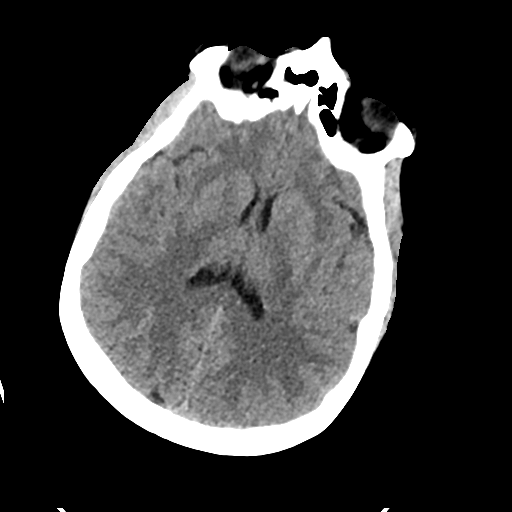
[im 23/33  brain]
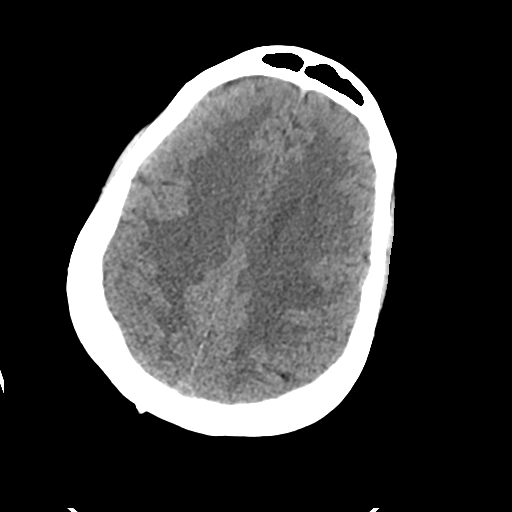
[im 23/33  bone]
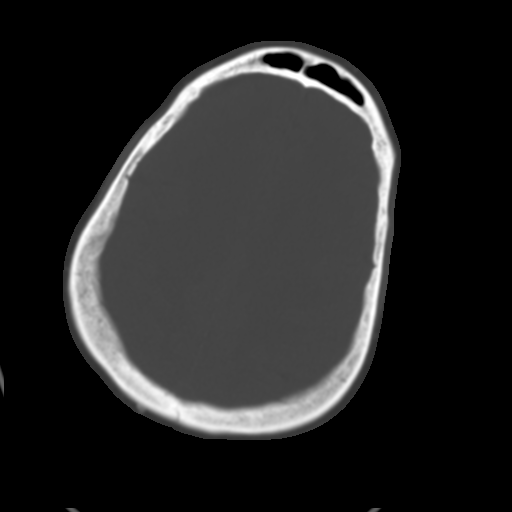
[im 28/33  brain]
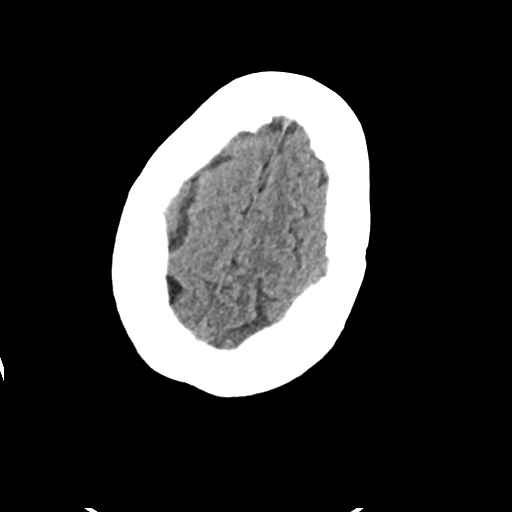

[Series 4: head bone · axial · 0.43mm/px · z∈[-147,-91]mm · 4 of 85 slices shown]
[im 9/85  bone]
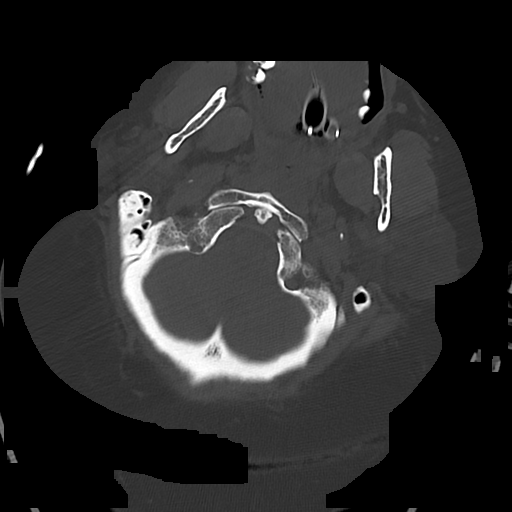
[im 17/85  bone]
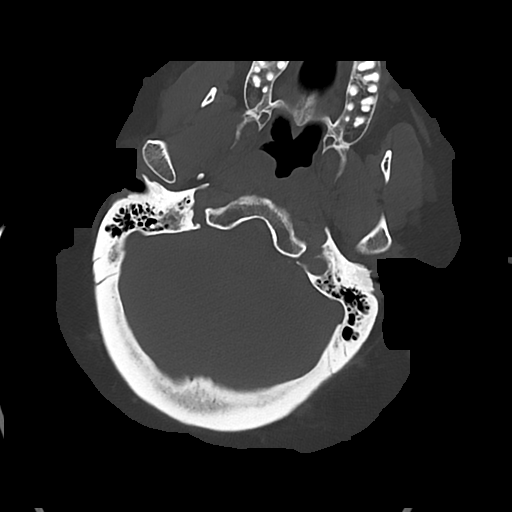
[im 29/85  bone]
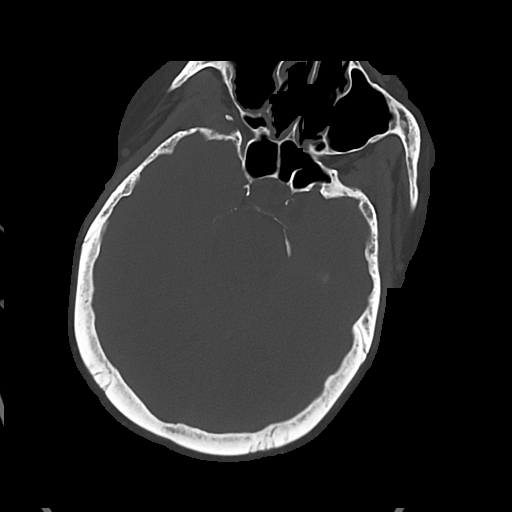
[im 37/85  bone]
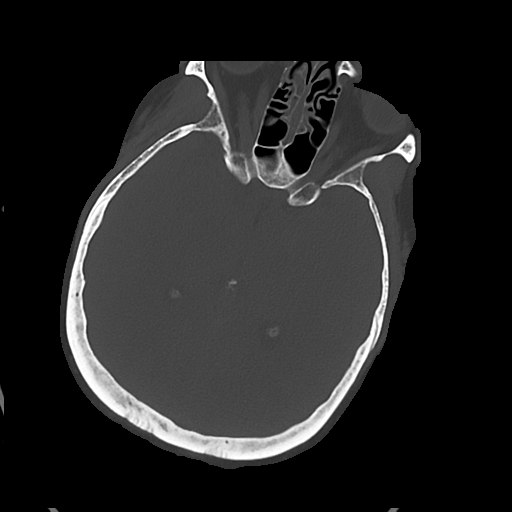

[Series 5: cor soft (person_name) · coronal · 0.34mm/px · 3 of 73 slices shown]
[im 25/73  brain]
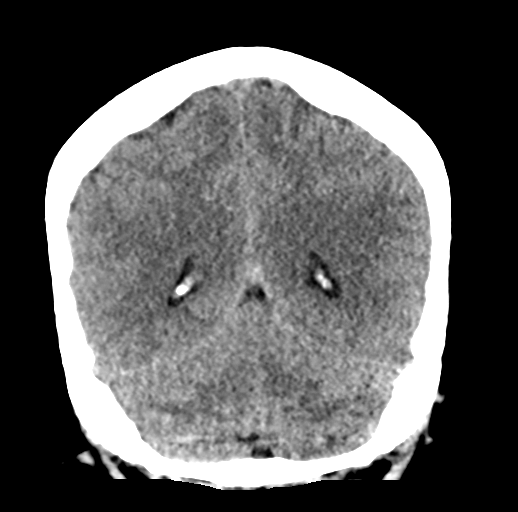
[im 33/73  brain]
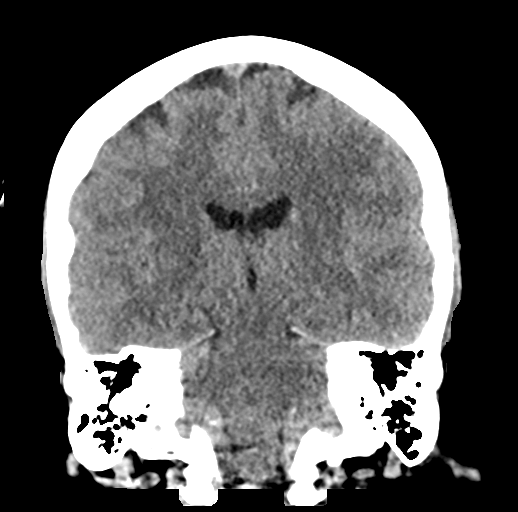
[im 41/73  brain]
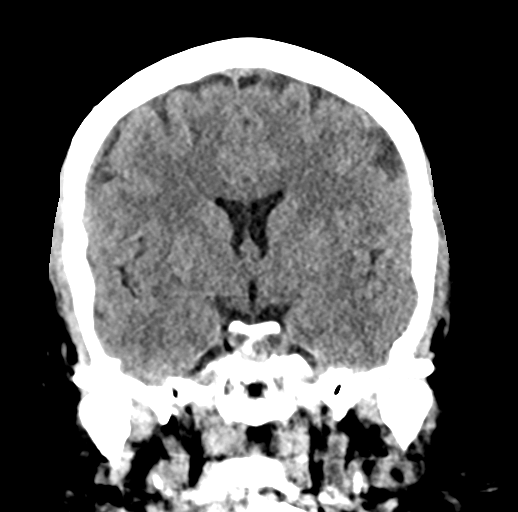

[Series 6: sag soft (person_name) · sagittal · 0.33mm/px · 3 of 59 slices shown]
[im 20/59  brain]
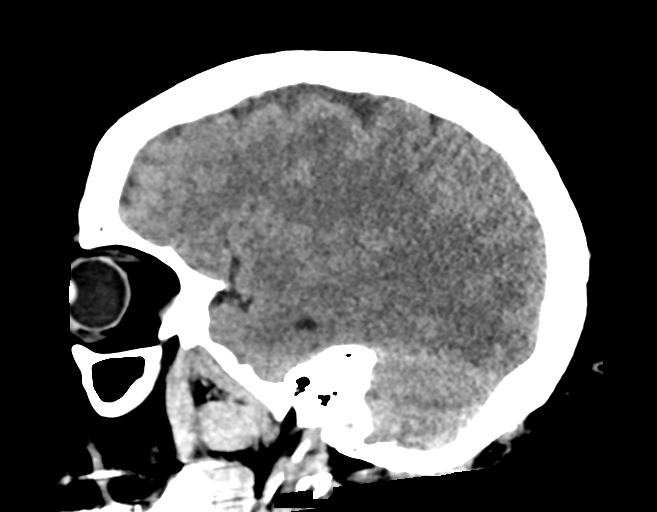
[im 30/59  brain]
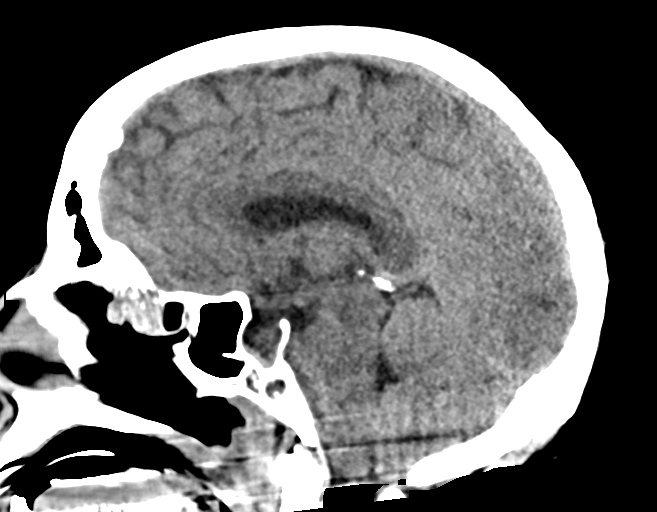
[im 39/59  brain]
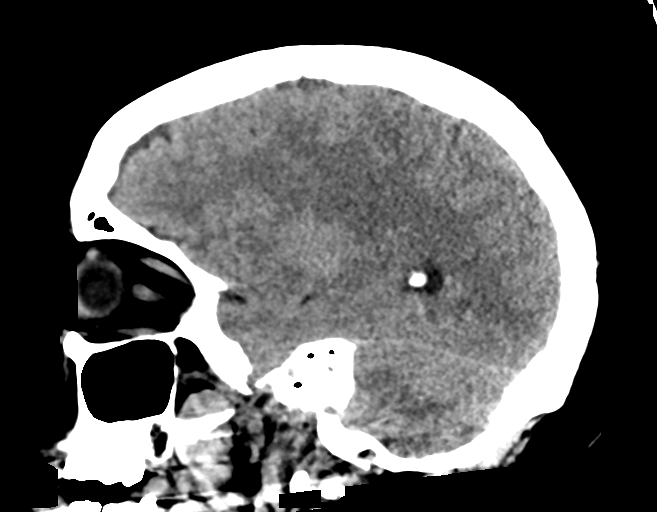

[16 of 47 positions shown; findings below may reference images not displayed]

FINDINGS: Brain: No evidence of acute infarction, hemorrhage, hydrocephalus,
extra-axial collection or mass lesion/mass effect.

Vascular: No hyperdense vessel or unexpected calcification.

Skull: Normal. Negative for fracture or focal lesion.

Sinuses/Orbits: Mild right maxillary sinus and bilateral ethmoid
sinus mucosal thickening is seen.

Other: Endotracheal and orogastric tubes are in place.
IMPRESSION: Stable exam without acute intracranial abnormality.

ADDENDUM:
Study discussed by telephone with Dr. BILLIOT on [DATE] at
[CD] hours.
He and I both note the abnormal appearance of the occipital horns on
MRI yesterday, and on this CT the horns - which were readily visible
at [CD] hours (series 3 image 13 on that exam) - are no longer
evident and therefore likely opacified with isodense material.

We discussed suspicion of meningitis/ventriculitis with debris more
so than intraventricular blood. This patient has underlying HIV, and
her WBC is increasing. CSF sampling and analysis is planned.

*** End of Addendum ***
FINDINGS: Brain: No evidence of acute infarction, hemorrhage, hydrocephalus,
extra-axial collection or mass lesion/mass effect.

Vascular: No hyperdense vessel or unexpected calcification.

Skull: Normal. Negative for fracture or focal lesion.

Sinuses/Orbits: Mild right maxillary sinus and bilateral ethmoid
sinus mucosal thickening is seen.

Other: Endotracheal and orogastric tubes are in place.
IMPRESSION: Stable exam without acute intracranial abnormality.

## 2020-05-01 IMAGING — US US THYROID
1 series · 13 of 25 positions shown · non-contrast
Comparison: None.

CLINICAL DATA: Thyromegaly

EXAM:
THYROID ULTRASOUND
TECHNIQUE: Ultrasound examination of the thyroid gland and adjacent soft
tissues was performed.

[Series 1: us thyroid · 13 of 64 slices shown]
[im 1/64]
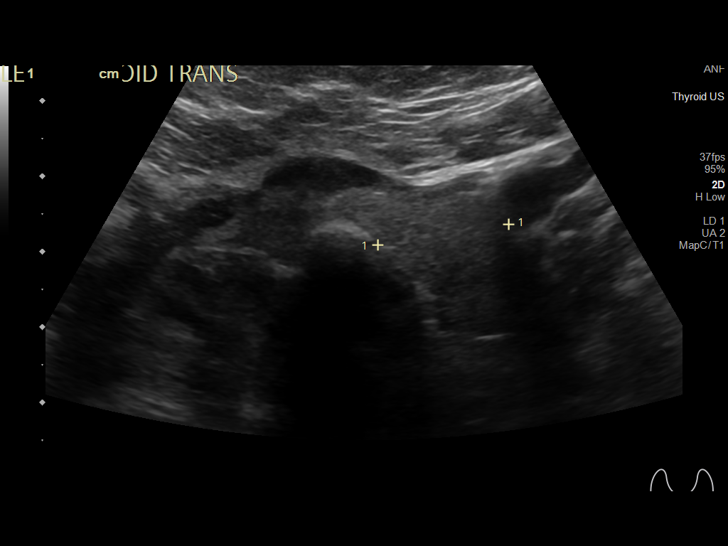
[im 6/64]
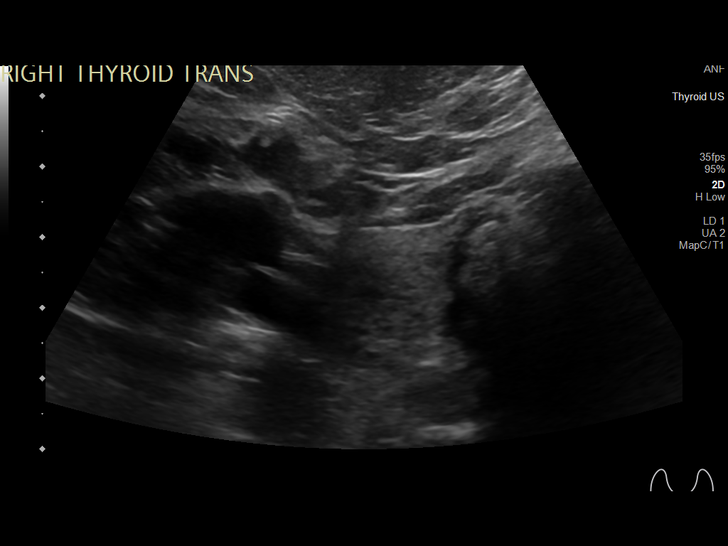
[im 11/64]
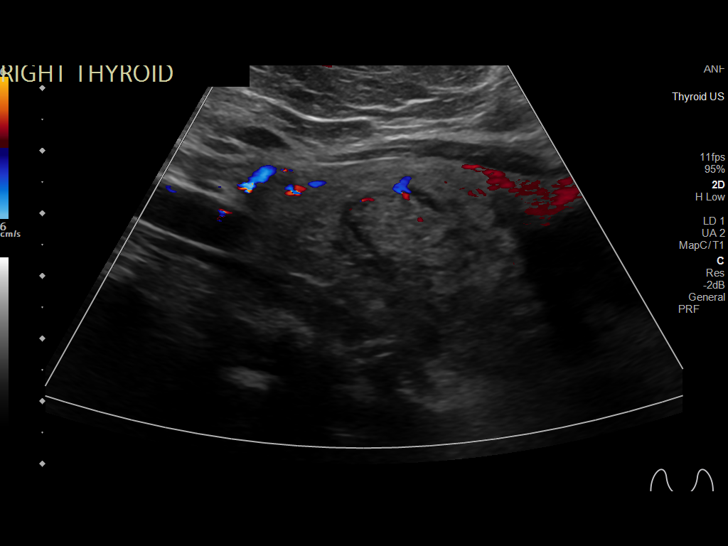
[im 16/64]
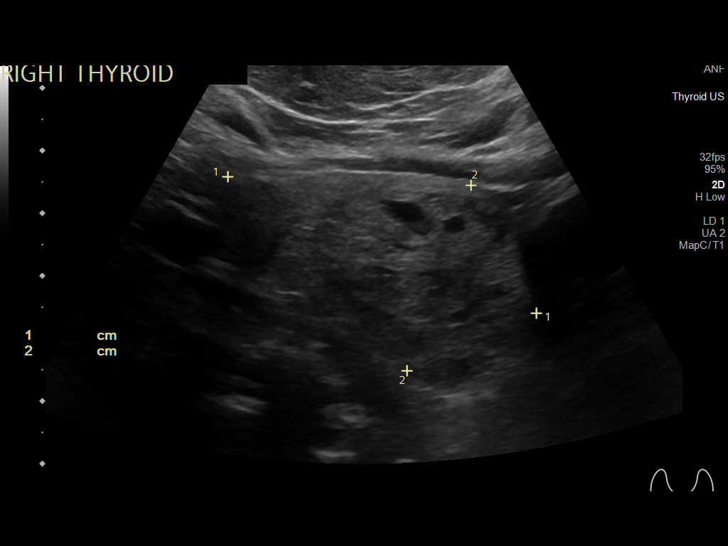
[im 22/64]
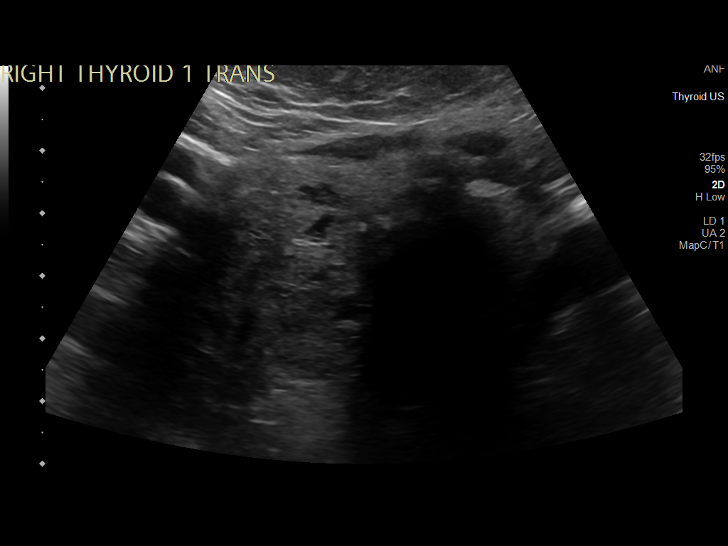
[im 27/64]
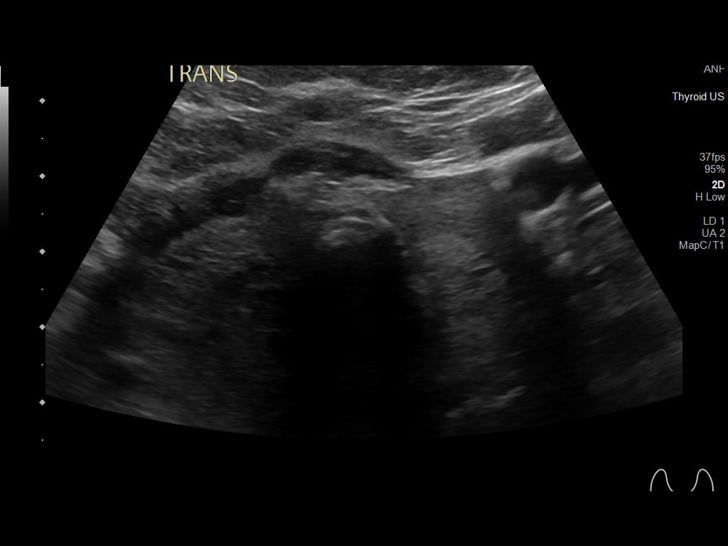
[im 32/64]
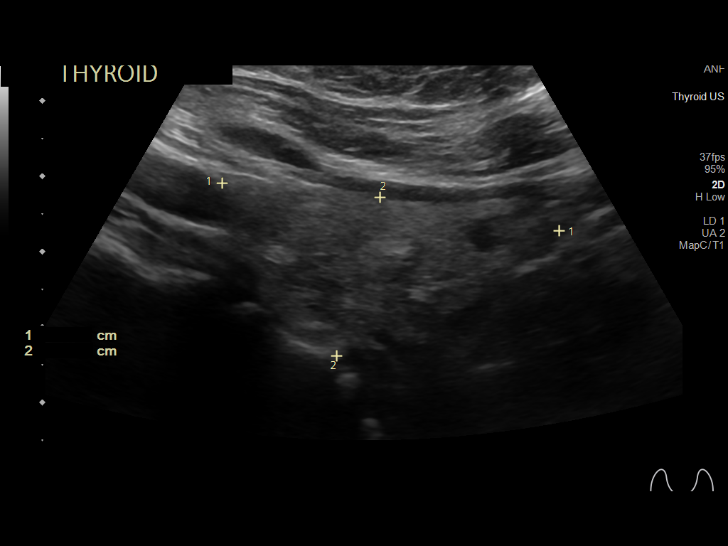
[im 37/64]
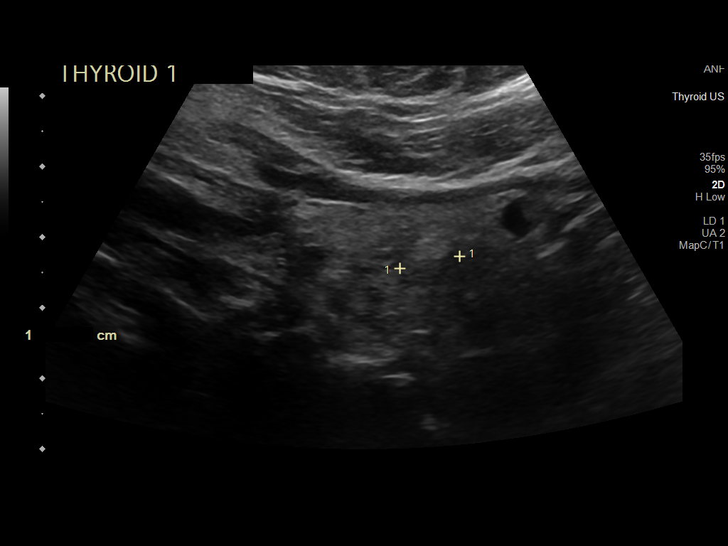
[im 43/64]
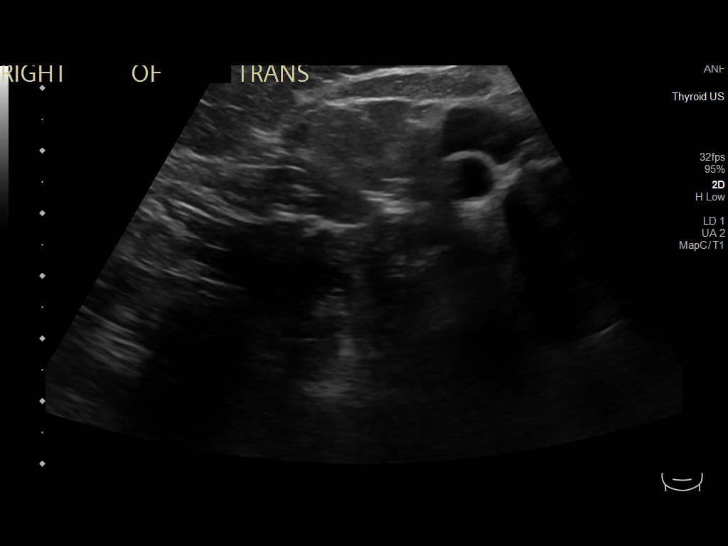
[im 48/64]
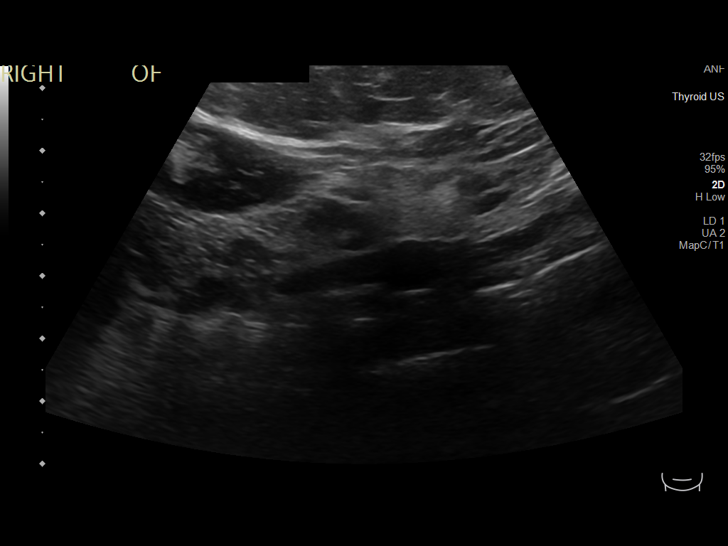
[im 53/64]
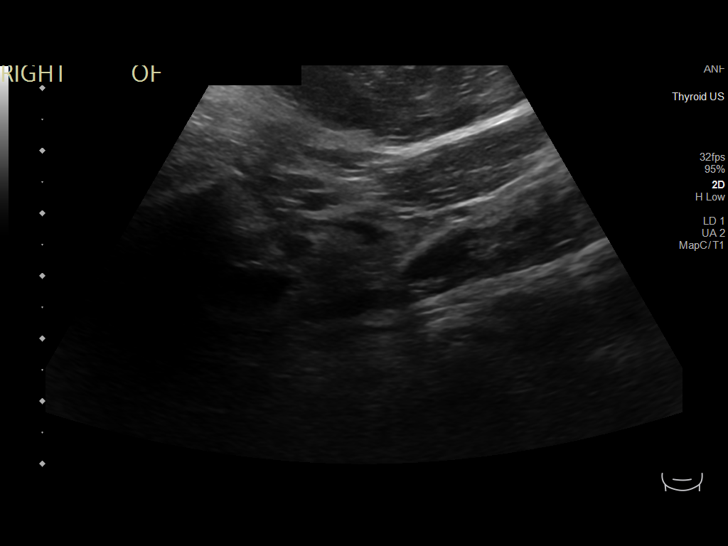
[im 58/64]
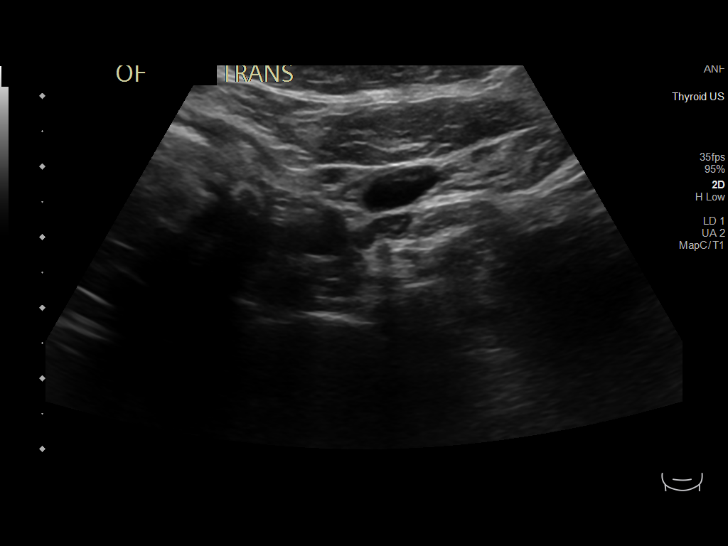
[im 64/64]
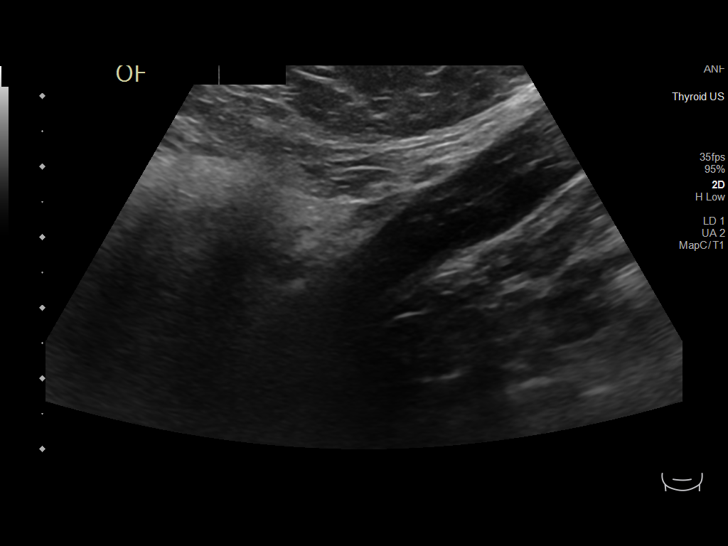

[13 of 25 positions shown; findings below may reference images not displayed]

FINDINGS: Parenchymal Echotexture: Moderately heterogeneous

Isthmus: 1.2 cm

Right lobe: 5.4 x 3.1 x 2.6 cm

Left lobe: 4.5 x 2.2 x 1.8 cm

_________________________________________________________

Estimated total number of nodules >/= 1 cm: 2

Number of spongiform nodules >/=  2 cm not described below (TR1): 0

Number of mixed cystic and solid nodules >/= 1.5 cm not described
below (TR2): 0

_________________________________________________________

Nodule # 1:

Location: Right; inferior

Maximum size: 4.4 cm; Other 2 dimensions: 3.2 x 2.7 cm

Composition: solid/almost completely solid (2)

Echogenicity: hypoechoic (2)

Shape: taller-than-wide (3)

Margins: smooth (0)

Echogenic foci: none (0)

ACR TI-RADS total points: 7.

ACR TI-RADS risk category: TR5 (>/= 7 points).

ACR TI-RADS recommendations:

**Given size (>/= 1.0 cm) and appearance, fine needle aspiration of
this highly suspicious nodule should be considered based on TI-RADS
criteria.

_________________________________________________________

Nodule # 2:

Location: Left; mid

Maximum size: 1.0 cm; Other 2 dimensions: 0.9 x 0.7 cm

Composition: solid/almost completely solid (2)

Echogenicity: hyperechoic (1)

Shape: not taller-than-wide (0)

Margins: smooth (0)

Echogenic foci: None

ACR TI-RADS total points: 3.

ACR TI-RADS risk category: TR3 (3 points).

ACR TI-RADS recommendations:

Given size (<1.4 cm) and appearance, this nodule does NOT meet
TI-RADS criteria for biopsy or dedicated follow-up.

_________________________________________________________
IMPRESSION: Nodule 1 (TI-RADS 5) located in the inferior right thyroid lobe,
measuring 4.4 x 3.2 x 2.7 cm, meets criteria for FNA.

The above is in keeping with the ACR TI-RADS recommendations - [HOSPITAL] [E1];[DATE].

## 2020-05-01 IMAGING — DX DG CHEST 1V PORT
1 series · 1 of 1 positions shown · non-contrast
Comparison: [DATE].

CLINICAL DATA: History of abnormal MRI.

EXAM:
PORTABLE CHEST 1 VIEW

[chest ap]
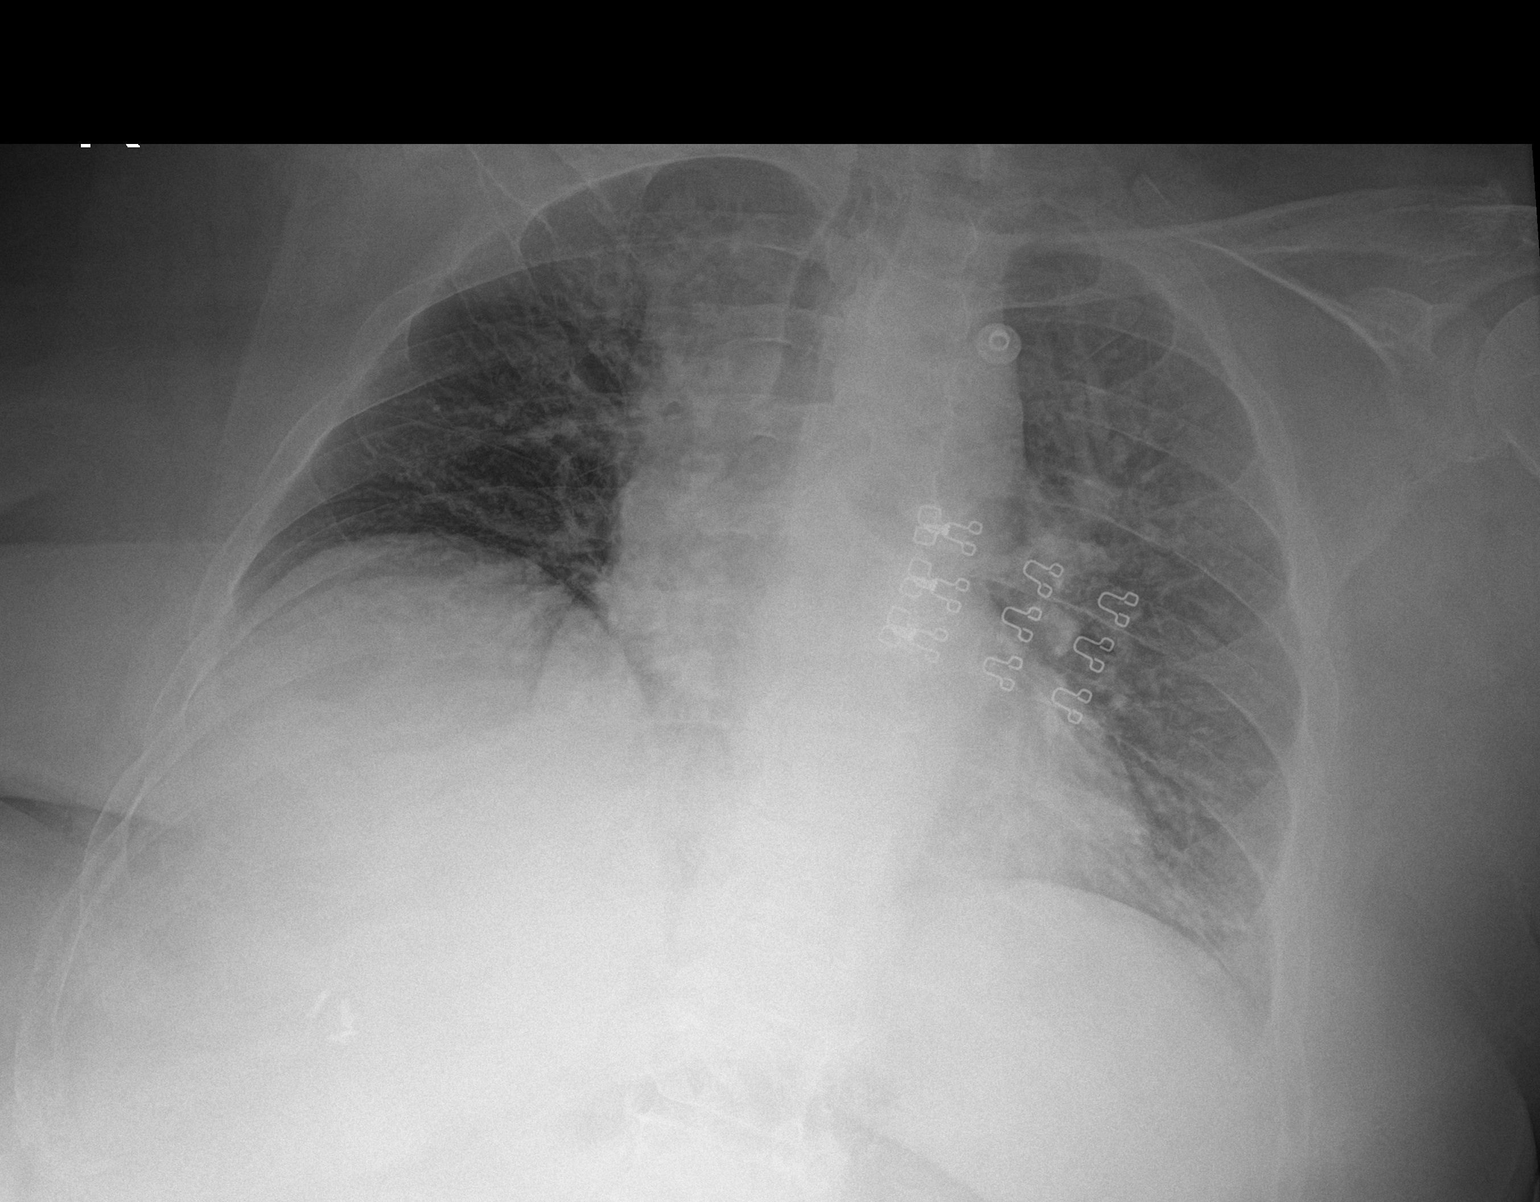

[1 of 1 positions shown; findings below may reference images not displayed]

FINDINGS: Limited exam as patient would not hold still for imaging.
Mediastinal prominence, most likely related to AP lordotic supine
projection. Heart size normal. Mild pulmonary venous congestion. Low
lung volumes. Mild bilateral interstitial prominence cannot be
excluded. Mild interstitial edema and/or pneumonitis cannot be
excluded. Tiny left pleural effusion cannot be excluded. No
pneumothorax. Surgical clips right upper quadrant. No acute bony
abnormality. Postsurgical changes right shoulder.
IMPRESSION: Limited exam as patient would not hold still for imaging. Heart size
normal. Mild pulmonary venous congestion. Low lung volumes. Mild
bilateral interstitial prominence cannot be excluded. Mild
interstitial edema and/or pneumonitis cannot be excluded. Tiny left
pleural effusion cannot be excluded.

## 2020-05-01 MED ORDER — DEXAMETHASONE SODIUM PHOSPHATE 10 MG/ML IJ SOLN
10.0000 mg | Freq: Four times a day (QID) | INTRAMUSCULAR | Status: AC
  Administered 2020-05-01 – 2020-05-05 (×16): 10 mg via INTRAVENOUS
  Filled 2020-05-01 (×15): qty 1

## 2020-05-01 MED ORDER — ROCURONIUM BROMIDE 10 MG/ML (PF) SYRINGE: 50.0000 mg | PREFILLED_SYRINGE | Freq: Once | INTRAVENOUS | Status: DC

## 2020-05-01 MED ORDER — CHLORHEXIDINE GLUCONATE 0.12% ORAL RINSE (MEDLINE KIT)
15.0000 mL | Freq: Two times a day (BID) | OROMUCOSAL | Status: DC
  Administered 2020-05-01 – 2020-05-03 (×4): 15 mL via OROMUCOSAL

## 2020-05-01 MED ORDER — VITAL HIGH PROTEIN PO LIQD
1000.0000 mL | ORAL | Status: DC
  Administered 2020-05-01 (×2): 1000 mL

## 2020-05-01 MED ORDER — PROSOURCE TF PO LIQD
45.0000 mL | Freq: Two times a day (BID) | ORAL | Status: DC
  Administered 2020-05-01 – 2020-05-02 (×3): 45 mL
  Filled 2020-05-01 (×3): qty 45

## 2020-05-01 MED ORDER — ETOMIDATE 2 MG/ML IV SOLN
INTRAVENOUS | Status: AC
  Administered 2020-05-01: 20 mg
  Filled 2020-05-01: qty 10

## 2020-05-01 MED ORDER — DEXMEDETOMIDINE HCL IN NACL 400 MCG/100ML IV SOLN
0.4000 ug/kg/h | INTRAVENOUS | Status: DC
  Administered 2020-05-01: 0.8 ug/kg/h via INTRAVENOUS
  Administered 2020-05-01: 1.2 ug/kg/h via INTRAVENOUS
  Administered 2020-05-01: 0.4 ug/kg/h via INTRAVENOUS
  Administered 2020-05-02: 1.2 ug/kg/h via INTRAVENOUS
  Administered 2020-05-02: 1 ug/kg/h via INTRAVENOUS
  Administered 2020-05-02: 1.2 ug/kg/h via INTRAVENOUS
  Administered 2020-05-02: 1 ug/kg/h via INTRAVENOUS
  Administered 2020-05-02: 0.5 ug/kg/h via INTRAVENOUS
  Administered 2020-05-02: 0.8 ug/kg/h via INTRAVENOUS
  Administered 2020-05-02: 1.2 ug/kg/h via INTRAVENOUS
  Administered 2020-05-03: 0.6 ug/kg/h via INTRAVENOUS
  Administered 2020-05-03: 0.7 ug/kg/h via INTRAVENOUS
  Filled 2020-05-01 (×11): qty 100

## 2020-05-01 MED ORDER — SODIUM CHLORIDE 0.9 % IV SOLN
2.0000 g | INTRAVENOUS | Status: DC
  Administered 2020-05-01 – 2020-05-06 (×29): 2 g via INTRAVENOUS
  Filled 2020-05-01 (×5): qty 2
  Filled 2020-05-01: qty 2000
  Filled 2020-05-01 (×4): qty 2
  Filled 2020-05-01: qty 2000
  Filled 2020-05-01: qty 2
  Filled 2020-05-01: qty 2000
  Filled 2020-05-01 (×3): qty 2
  Filled 2020-05-01: qty 2000
  Filled 2020-05-01: qty 2
  Filled 2020-05-01: qty 2000
  Filled 2020-05-01 (×2): qty 2
  Filled 2020-05-01: qty 2000
  Filled 2020-05-01: qty 2
  Filled 2020-05-01: qty 2000
  Filled 2020-05-01 (×2): qty 2
  Filled 2020-05-01: qty 2000
  Filled 2020-05-01 (×3): qty 2
  Filled 2020-05-01 (×3): qty 2000
  Filled 2020-05-01 (×3): qty 2

## 2020-05-01 MED ORDER — DEXTROSE 5 % IV SOLN
10.0000 mg/kg | Freq: Three times a day (TID) | INTRAVENOUS | Status: DC
  Administered 2020-05-01 – 2020-05-02 (×4): 775 mg via INTRAVENOUS
  Filled 2020-05-01 (×8): qty 15.5

## 2020-05-01 MED ORDER — TEMAZEPAM 15 MG PO CAPS
15.0000 mg | ORAL_CAPSULE | Freq: Every evening | ORAL | Status: DC | PRN
  Administered 2020-05-02: 15 mg
  Filled 2020-05-01: qty 1

## 2020-05-01 MED ORDER — FENTANYL CITRATE (PF) 100 MCG/2ML IJ SOLN
12.5000 ug | Freq: Once | INTRAMUSCULAR | Status: AC
  Administered 2020-05-01: 12.5 ug via INTRAVENOUS
  Filled 2020-05-01: qty 2

## 2020-05-01 MED ORDER — PERFLUTREN LIPID MICROSPHERE
1.0000 mL | INTRAVENOUS | Status: AC | PRN
Start: 2020-05-01 — End: 2020-05-01
  Administered 2020-05-01: 4 mL via INTRAVENOUS
  Filled 2020-05-01: qty 10

## 2020-05-01 MED ORDER — TEMAZEPAM 15 MG PO CAPS: 15.0000 mg | ORAL_CAPSULE | Freq: Every evening | ORAL | Status: DC | PRN

## 2020-05-01 MED ORDER — LABETALOL HCL 5 MG/ML IV SOLN
10.0000 mg | INTRAVENOUS | Status: DC | PRN
  Administered 2020-05-01 – 2020-05-04 (×4): 10 mg via INTRAVENOUS
  Filled 2020-05-01 (×3): qty 4

## 2020-05-01 MED ORDER — SENNOSIDES-DOCUSATE SODIUM 8.6-50 MG PO TABS: 1.0000 | ORAL_TABLET | Freq: Every evening | ORAL | Status: DC | PRN

## 2020-05-01 MED ORDER — EMTRICITAB-RILPIVIR-TENOFOV AF 200-25-25 MG PO TABS
1.0000 | ORAL_TABLET | Freq: Every day | ORAL | Status: DC
  Filled 2020-05-01: qty 1

## 2020-05-01 MED ORDER — MIDAZOLAM HCL 2 MG/2ML IJ SOLN
2.0000 mg | Freq: Once | INTRAMUSCULAR | Status: AC
  Administered 2020-05-01: 2 mg via INTRAVENOUS
  Filled 2020-05-01: qty 2

## 2020-05-01 MED ORDER — VANCOMYCIN HCL 750 MG/150ML IV SOLN
750.0000 mg | Freq: Two times a day (BID) | INTRAVENOUS | Status: DC
  Administered 2020-05-02 – 2020-05-04 (×6): 750 mg via INTRAVENOUS
  Filled 2020-05-01 (×6): qty 150

## 2020-05-01 MED ORDER — EMTRICITABINE-TENOFOVIR AF 200-25 MG PO TABS
1.0000 | ORAL_TABLET | Freq: Every day | ORAL | Status: DC
  Administered 2020-05-01 – 2020-05-03 (×3): 1
  Filled 2020-05-01 (×3): qty 1

## 2020-05-01 MED ORDER — MIDAZOLAM 50MG/50ML (1MG/ML) PREMIX INFUSION
0.5000 mg/h | INTRAVENOUS | Status: DC
  Administered 2020-05-01: 4 mg/h via INTRAVENOUS
  Filled 2020-05-01 (×2): qty 50

## 2020-05-01 MED ORDER — ROCURONIUM BROMIDE 10 MG/ML (PF) SYRINGE
PREFILLED_SYRINGE | INTRAVENOUS | Status: AC
  Administered 2020-05-01: 100 mg
  Filled 2020-05-01: qty 10

## 2020-05-01 MED ORDER — FENTANYL CITRATE (PF) 100 MCG/2ML IJ SOLN
50.0000 ug | INTRAMUSCULAR | Status: DC | PRN
  Administered 2020-05-01 – 2020-05-03 (×7): 100 ug via INTRAVENOUS
  Filled 2020-05-01 (×7): qty 2

## 2020-05-01 MED ORDER — FENTANYL CITRATE (PF) 100 MCG/2ML IJ SOLN
50.0000 ug | INTRAMUSCULAR | Status: AC | PRN
  Administered 2020-05-01 (×4): 50 ug via INTRAVENOUS
  Filled 2020-05-01 (×2): qty 2

## 2020-05-01 MED ORDER — ORAL CARE MOUTH RINSE
15.0000 mL | OROMUCOSAL | Status: DC
  Administered 2020-05-01 – 2020-05-03 (×17): 15 mL via OROMUCOSAL

## 2020-05-01 MED ORDER — CHLORHEXIDINE GLUCONATE CLOTH 2 % EX PADS
6.0000 | MEDICATED_PAD | Freq: Every day | CUTANEOUS | Status: DC
  Administered 2020-05-01 – 2020-05-08 (×7): 6 via TOPICAL

## 2020-05-01 MED ORDER — LORAZEPAM 2 MG/ML IJ SOLN
2.0000 mg | Freq: Once | INTRAMUSCULAR | Status: AC | PRN
  Administered 2020-05-01: 2 mg via INTRAVENOUS
  Filled 2020-05-01: qty 1

## 2020-05-01 MED ORDER — SODIUM CHLORIDE 0.9 % IV SOLN
INTRAVENOUS | Status: DC | PRN
  Administered 2020-05-01: 250 mL via INTRAVENOUS

## 2020-05-01 MED ORDER — DOLUTEGRAVIR SODIUM 50 MG PO TABS
50.0000 mg | ORAL_TABLET | Freq: Every day | ORAL | Status: DC
  Administered 2020-05-01 – 2020-05-03 (×3): 50 mg
  Filled 2020-05-01 (×3): qty 1

## 2020-05-01 MED ORDER — VANCOMYCIN HCL 10 G IV SOLR
2500.0000 mg | Freq: Once | INTRAVENOUS | Status: AC
  Administered 2020-05-01: 2500 mg via INTRAVENOUS
  Filled 2020-05-01 (×2): qty 2500

## 2020-05-01 MED ORDER — SODIUM CHLORIDE 0.9 % IV SOLN
2.0000 g | Freq: Two times a day (BID) | INTRAVENOUS | Status: DC
  Administered 2020-05-01 – 2020-05-08 (×15): 2 g via INTRAVENOUS
  Filled 2020-05-01 (×2): qty 20
  Filled 2020-05-01: qty 2
  Filled 2020-05-01: qty 20
  Filled 2020-05-01 (×5): qty 2
  Filled 2020-05-01: qty 20
  Filled 2020-05-01 (×7): qty 2

## 2020-05-01 MED ORDER — ETOMIDATE 2 MG/ML IV SOLN: 20.0000 mg | Freq: Once | INTRAVENOUS | Status: AC

## 2020-05-01 MED ORDER — ONDANSETRON HCL 4 MG/2ML IJ SOLN
4.0000 mg | Freq: Once | INTRAMUSCULAR | Status: AC
  Administered 2020-05-01: 4 mg via INTRAVENOUS

## 2020-05-01 NOTE — ED Notes (Signed)
0130 BP of 170/157 included because it fell into the TPA timeline but pt was throwing up at this time so it is most likely erroneous when compared with the surrounding BP's.

## 2020-05-01 NOTE — Progress Notes (Addendum)
STROKE TEAM PROGRESS NOTE   INTERVAL HISTORY She is unable to answer questions today. She is shifting around in bed moaning. When asked what is wrong she will stop moaning and look at questioner but not answer and then will restart moaning. Unable to answer any questions. Will order ABG and MRI.  Came back to see patient around 11:45 after she returned from her MRI due to her appearing to be in pain. She continued to be unable to answer questions with words. When asked if she was having a headache she was able to nod yes.  Her son was bedside. He reports that about 2 months ago she had a "very hard" sneeze that she reported feeling a pop in her head. She then had liquid drip out of her nose for a few hours. She then started getting headaches off and on that progressively getting worse until yesterday.  MRI revealed - Fluid level within the occipital horn of the bilateral lateral ventricles with low signal on T2 and associated restricted diffusion. Findings are suggestive of small intraventricular hemorrhage. However, the possibility of ventriculitis CT be excluded. CSF sampling may be helpful for further evaluation.  Given her immunocompromised status will begin empirical treatment for Meningitis since a Spinal Tap can be done until after 2200 this evening since she received tPA. Given possible IVH will not start antithrombotics or Statin medications at this time.  Vitals:   05/01/20 0800 05/01/20 0900 05/01/20 1000 05/01/20 1200  BP: (!) 153/74 (!) 142/124 (!) 148/112 (!) 160/74  Pulse: 94 94 100 (!) 104  Resp: (!) 35 (!) 27 (!) 41 (!) 47  Temp: 98.8 F (37.1 C)   100.1 F (37.8 C)  TempSrc: Axillary   Axillary  SpO2: 97% 99% 98% 97%  Weight:    105.3 kg  Height:     (1.676 m)   CBC:  Recent Labs  Lab 04/30/20 2106 04/30/20 2131 05/01/20 0905  WBC 10.7*  --   --   NEUTROABS 9.6*  --   --   HGB 13.0 13.6 13.3  HCT 38.5 40.0 39.0  MCV 89.5  --   --   PLT 336  --   --     Basic Metabolic Panel:  Recent Labs  Lab 04/30/20 2106 04/30/20 2131 05/01/20 0905  NA 135 136 137  K 4.1 4.1 3.6  CL 105 104  --   CO2 20*  --   --   GLUCOSE 107* 106*  --   BUN 14 16  --   CREATININE 0.88 0.80  --   CALCIUM 9.1  --   --    Lipid Panel:  Recent Labs  Lab 05/01/20 0509  CHOL 133  TRIG 27  HDL 46  CHOLHDL 2.9  VLDL 5  LDLCALC 82   HgbA1c:  Recent Labs  Lab 05/01/20 0504  HGBA1C 5.7*   Urine Drug Screen: No results for input(s): LABOPIA, COCAINSCRNUR, LABBENZ, AMPHETMU, THCU, LABBARB in the last 168 hours.  Alcohol Level No results for input(s): ETH in the last 168 hours.  IMAGING past 24 hours CT ANGIO HEAD W OR WO CONTRAST  Result Date: 04/30/2020 CLINICAL DATA:  Headache and left facial droop EXAM: CT ANGIOGRAPHY HEAD AND NECK TECHNIQUE: Multidetector CT imaging of the head and neck was performed using the standard protocol during bolus administration of intravenous contrast. Multiplanar CT image reconstructions and MIPs were obtained to evaluate the vascular anatomy. Carotid stenosis measurements (when applicable) are obtained utilizing NASCET  criteria, using the distal internal carotid diameter as the denominator. CONTRAST:  20mL OMNIPAQUE IOHEXOL 350 MG/ML SOLN COMPARISON:  None. FINDINGS: CTA NECK FINDINGS SKELETON: There is no bony spinal canal stenosis. No lytic or blastic lesion. OTHER NECK: Normal pharynx, larynx and major salivary glands. No cervical lymphadenopathy. Enlarged right thyroid lobe. UPPER CHEST: No pneumothorax or pleural effusion. No nodules or masses. AORTIC ARCH: There is no calcific atherosclerosis of the aortic arch. There is no aneurysm, dissection or hemodynamically significant stenosis of the visualized portion of the aorta. Conventional 3 vessel aortic branching pattern. The visualized proximal subclavian arteries are widely patent. RIGHT CAROTID SYSTEM: Normal without aneurysm, dissection or stenosis. LEFT CAROTID SYSTEM: No  dissection, occlusion or aneurysm. Mild atherosclerotic calcification at the carotid bifurcation without hemodynamically significant stenosis. VERTEBRAL ARTERIES: Left dominant configuration. Both origins are clearly patent. There is no dissection, occlusion or flow-limiting stenosis to the skull base (V1-V3 segments). CTA HEAD FINDINGS POSTERIOR CIRCULATION: --Vertebral arteries: Normal V4 segments. --Inferior cerebellar arteries: Normal. --Basilar artery: Normal. --Superior cerebellar arteries: Normal. --Posterior cerebral arteries (PCA): Normal. ANTERIOR CIRCULATION: --Intracranial internal carotid arteries: Normal. --Anterior cerebral arteries (ACA): Normal. Both A1 segments are present. Patent anterior communicating artery (a-comm). --Middle cerebral arteries (MCA): Normal. VENOUS SINUSES: As permitted by contrast timing, patent. ANATOMIC VARIANTS: None Review of the MIP images confirms the above findings. IMPRESSION: 1. No intracranial arterial occlusion or high-grade stenosis. 2. Enlarged right thyroid lobe. Recommend thyroid ultrasound (ref: J Am Coll Radiol. 2015 Feb;12(2): 143-50). Electronically Signed   By: Deatra Robinson M.D.   On: 04/30/2020 21:48   CT HEAD WO CONTRAST  Result Date: 05/01/2020 CLINICAL DATA:  Status post tPA administration for CVA. Frontal headache. EXAM: CT HEAD WITHOUT CONTRAST TECHNIQUE: Contiguous axial images were obtained from the base of the skull through the vertex without intravenous contrast. COMPARISON:  April 30, 2020 FINDINGS: Brain: Ventricles and sulci are normal in size and configuration. There is stable invagination of CSF into the sella. There is no intracranial mass, hemorrhage, extra-axial fluid collection, or midline shift. Brain parenchyma appears unremarkable. No acute infarct is demonstrable. Vascular: No hyperdense vessel. No appreciable vascular calcification. Skull: Bony calvarium appears intact. Sinuses/Orbits: There is mucosal thickening in the right  maxillary antrum. There is mucosal thickening and opacification in several ethmoid air cells. Orbits appear symmetric bilaterally. Other: Mastoid air cells are clear. IMPRESSION: 1. Normal appearing brain parenchyma. No acute infarct evident. No mass or hemorrhage. There is a degree of empty sella, a stable finding of questionable significance. 2.  There are foci of paranasal sinus disease. Electronically Signed   By: Bretta Bang III M.D.   On: 05/01/2020 08:03   CT ANGIO NECK W OR WO CONTRAST  Result Date: 04/30/2020 CLINICAL DATA:  Headache and left facial droop EXAM: CT ANGIOGRAPHY HEAD AND NECK TECHNIQUE: Multidetector CT imaging of the head and neck was performed using the standard protocol during bolus administration of intravenous contrast. Multiplanar CT image reconstructions and MIPs were obtained to evaluate the vascular anatomy. Carotid stenosis measurements (when applicable) are obtained utilizing NASCET criteria, using the distal internal carotid diameter as the denominator. CONTRAST:  78mL OMNIPAQUE IOHEXOL 350 MG/ML SOLN COMPARISON:  None. FINDINGS: CTA NECK FINDINGS SKELETON: There is no bony spinal canal stenosis. No lytic or blastic lesion. OTHER NECK: Normal pharynx, larynx and major salivary glands. No cervical lymphadenopathy. Enlarged right thyroid lobe. UPPER CHEST: No pneumothorax or pleural effusion. No nodules or masses. AORTIC ARCH: There is no calcific atherosclerosis of  the aortic arch. There is no aneurysm, dissection or hemodynamically significant stenosis of the visualized portion of the aorta. Conventional 3 vessel aortic branching pattern. The visualized proximal subclavian arteries are widely patent. RIGHT CAROTID SYSTEM: Normal without aneurysm, dissection or stenosis. LEFT CAROTID SYSTEM: No dissection, occlusion or aneurysm. Mild atherosclerotic calcification at the carotid bifurcation without hemodynamically significant stenosis. VERTEBRAL ARTERIES: Left dominant  configuration. Both origins are clearly patent. There is no dissection, occlusion or flow-limiting stenosis to the skull base (V1-V3 segments). CTA HEAD FINDINGS POSTERIOR CIRCULATION: --Vertebral arteries: Normal V4 segments. --Inferior cerebellar arteries: Normal. --Basilar artery: Normal. --Superior cerebellar arteries: Normal. --Posterior cerebral arteries (PCA): Normal. ANTERIOR CIRCULATION: --Intracranial internal carotid arteries: Normal. --Anterior cerebral arteries (ACA): Normal. Both A1 segments are present. Patent anterior communicating artery (a-comm). --Middle cerebral arteries (MCA): Normal. VENOUS SINUSES: As permitted by contrast timing, patent. ANATOMIC VARIANTS: None Review of the MIP images confirms the above findings. IMPRESSION: 1. No intracranial arterial occlusion or high-grade stenosis. 2. Enlarged right thyroid lobe. Recommend thyroid ultrasound (ref: J Am Coll Radiol. 2015 Feb;12(2): 143-50). Electronically Signed   By: Deatra Robinson M.D.   On: 04/30/2020 21:48   MR BRAIN WO CONTRAST  Result Date: 05/01/2020 CLINICAL DATA:  Stroke follow-up. EXAM: MRI HEAD WITHOUT CONTRAST TECHNIQUE: Multiplanar, multiecho pulse sequences of the brain and surrounding structures were obtained without intravenous contrast. COMPARISON:  Head CT May 01, 2020 FINDINGS: Brain: No acute infarction, hydrocephalus or mass lesion. Fluid level within the occipital horn of the bilateral lateral ventricles with low signal on T2 and associated restricted diffusion. There is also hyperintensity of the cerebral sulci in the parietooccipital region. Vascular: Normal flow voids. Skull and upper cervical spine: Cervical spine is obscured by artifact. Normal marrow signal in the calvarium. Sinuses/Orbits: Mucosal thickening of the right maxillary sinus and bilateral ethmoid cells. IMPRESSION: Fluid level within the occipital horn of the bilateral lateral ventricles with low signal on T2 and associated restricted  diffusion. Findings are suggestive of small intraventricular hemorrhage. However, the possibility of ventriculitis CT be excluded. CSF sampling may be helpful for further evaluation. These results were called by telephone at the time of interpretation on 05/01/2020 at 11:56 am to provider [facility] , who verbally acknowledged these results. Electronically Signed   By: Baldemar Lenis M.D.   On: 05/01/2020 11:55   DG CHEST PORT 1 VIEW  Result Date: 05/01/2020 CLINICAL DATA:  History of abnormal MRI. EXAM: PORTABLE CHEST 1 VIEW COMPARISON:  08/16/2019. FINDINGS: Limited exam as patient would not hold still for imaging. Mediastinal prominence, most likely related to AP lordotic supine projection. Heart size normal. Mild pulmonary venous congestion. Low lung volumes. Mild bilateral interstitial prominence cannot be excluded. Mild interstitial edema and/or pneumonitis cannot be excluded. Tiny left pleural effusion cannot be excluded. No pneumothorax. Surgical clips right upper quadrant. No acute bony abnormality. Postsurgical changes right shoulder. IMPRESSION: Limited exam as patient would not hold still for imaging. Heart size normal. Mild pulmonary venous congestion. Low lung volumes. Mild bilateral interstitial prominence cannot be excluded. Mild interstitial edema and/or pneumonitis cannot be excluded. Tiny left pleural effusion cannot be excluded. Electronically Signed   By: Maisie Fus  Register   On: 05/01/2020 06:53   CT HEAD CODE STROKE WO CONTRAST  Result Date: 04/30/2020 CLINICAL DATA:  Code stroke.  Facial droop, left.  Headache. EXAM: CT HEAD WITHOUT CONTRAST TECHNIQUE: Contiguous axial images were obtained from the base of the skull through the vertex without intravenous contrast. COMPARISON:  None. FINDINGS: Brain: There is no mass, hemorrhage or extra-axial collection. The size and configuration of the ventricles and extra-axial CSF spaces are normal. The brain parenchyma is normal,  without evidence of acute or chronic infarction. Vascular: No abnormal hyperdensity of the major intracranial arteries or dural venous sinuses. No intracranial atherosclerosis. Skull: The visualized skull base, calvarium and extracranial soft tissues are normal. Sinuses/Orbits: No fluid levels or advanced mucosal thickening of the visualized paranasal sinuses. No mastoid or middle ear effusion. The orbits are normal. ASPECTS [facility]([place] Stroke Program Early CT Score) - Ganglionic level infarction (caudate, lentiform nuclei, internal capsule, insula, M1-M3 cortex): 7 - Supraganglionic infarction (M4-M6 cortex): 3 Total score (0-10 with 10 being normal): 10 IMPRESSION: 1. Normal head CT. 2. ASPECTS is 10. These results were communicated to Dr. Caryl PinaEric Lindzen at 9:20 pm on 04/30/2020 by text page via the [facility] messaging system. Electronically Signed   By: Deatra RobinsonKevin  Herman M.D.   On: 04/30/2020 21:20    PHYSICAL EXAM Blood pressure (!) 160/74, pulse (!) 104, temperature 100.1 F (37.8 C), temperature source Axillary, resp. rate (!) 47, height 5\' 6"  (1.676 m), weight 105.3 kg, SpO2 97 %.  General: alert and awake, obese caucasian female, moaning but unable to express any particular area   Lungs: Symmetrical Chest rise, no labored breathing  Cardio: Regular Rate and Rhythm  Abdomen: Soft, non-tender  Neuro: Alert but not oriented. Poorly tracks people in the room. Not able to follow any commands. Moans throughout interview but cannot answer any questions.  Motor: Able to move all extremities and when raised has no drift noted. Tone and bulk:normal tone throughout; no atrophy noted   ASSESSMENT/PLAN Gail Edwards is a 65 y.o. female with history of HIV and prediabetes presenting to the ED as a Code Stroke. LKN was 7:15 PM, but she had had a worsening bifrontal nonthrobbing headache since 4 PM. While at church, her headache continued to worsen. After the service, she spoke with some people while going back  to her car and she was noted to have altered speech. EMS was called and on arrival they noted intermittent garbled speech and left facial droop. Members of her congregation had noted LUE weakness, but EMS did not appreciate this. Her car was parked crooked per EMS, so they suspect that her symptoms may have been present at the time of her initial arrival to church, but per congregants she was at her normal baseline during the service. She initially had been speaking normally to congregants, with TOSO noted by them to be 7:15 PM. EMS noted her speech and facial droop to wax and wane in concert during initial assessment and transport. BP was 190/110 and CBG was 144. On arrival to the ED, she continued to having intermittent garbled speech, but comprehension was intact. Not on Aspirin, Plavix, or any anticoagulant.   She is awake but unable to follow any commands or answer questions. Her MRI suggest possible small IVH vs Ventriculitis. Given possible IVH will not start any antithrombotics or Statin medications. Will begin empiric treatment for meningitis given her HIV status and inability to get a Spinal tape until 2200 tonight since she received tPA last night. Consulted Pharmacy to start coverage - Ampicillin, Ceftriaxone,Vancomycin, and Acyclovir. She was able to shake her head yes that she had a headache. Nurse will give her a dose of rectal Acetaminophen as her safety with taking oral medications is unknown at this time.   Impression: Receptive aphasia-differentials include TIA versus  stroke versus CNS infection.  TIA vs Stroke status post IV TPA given her presentation  Code Stroke CT head No acute abnormality. ASPECTS 10.    CTA head & neck - No intracranial arterial occlusion or high-grade stenosis. Enlarged right thyroid lobe. Recommend thyroid ultrasound.  CT Head post tPA - Normal appearing brain parenchyma. No acute infarct evident. No mass or hemorrhage. There is a degree of empty sella, a  stable finding of questionable significance. There are foci of paranasal sinus disease.  2D Echo - Ordered  LDL 82  HgbA1c 5.7  VTE prophylaxis - SCD's    Diet   Diet NPO time specified    No antithrombotic prior to admission, now on No antithrombotic. Given possible IVH will not start antithrombotic at this time.  24h CTH  Therapy recommendations:  Pending  Disposition:  Pending    Ventriculitis versus intraventricular hemorrhage post TPA  MRI - Fluid level within the occipital horn of the bilateral lateral ventricles with low signal on T2 and associated restricted diffusion. Findings are suggestive of small intraventricular hemorrhage. However, the possibility of ventriculitis CT be excluded. CSF sampling may be helpful for further evaluation.  Will have to wait at least 24 hours from IV TPA.  Start Acyclovir IV 775 mg q8  Start Ampicillin 2 g q4  Start Ceftriaxone 2 g q12  Start Vancomycin 750 mg q12  No tPA reversal due to tPA given nearly 12h from the finding of concern for IVH  Hypertension  Home meds: None  Stable . Long-term BP goal normotensive  Hyperlipidemia  Home meds:  none  LDL 82, goal < 70  Will not start a statin at this time due to possible IVH  Pre-Diabetes  Home meds:  None  HgbA1c 5.7, goal < 7.0  CBGs Recent Labs    04/30/20 2156  GLUCAP 113*      SSI  Other Stroke Risk Factors  Obesity, Body mass index is 37.47 kg/m., BMI >/= 30 associated with increased stroke risk, recommend weight loss, diet and exercise as appropriate   Other Active Problems  HIV - Emtricitabine-Rilpivir-Tenofovir    Medications Currently Ordered  Acetaminophen  Clevidipine 0.5 mg/ml IV continuous  Emtricitabine-Rilpivir-Tenofovir 25 mg daily  Protonix 40 mg IV QHS  Senokot-S 1 tablet QHS PRN  Temazepam 15 mg QHS PRN  Acyclovir IV 775 mg q8  Ampicillin 2 g q4  Ceftriaxone 2 g q12  Vancomycin 750 mg q12  Hospital day #  1  Arna Snipe MD Resident  Attending addendum Patient seen and examined Imaging reviewed On my examination this morning, the patient was drowsy, opens eyes to voice, did not follow any commands, was barely verbal with some moaning sounds and some inappropriate yes/no responses. She continued to decline in terms of her mentation prompting consultation to pulmonary critical care medicine after imaging. Her imaging was done because of this change-I was told that before this she was able to follow some commands and was talking. CT head unremarkable. I pursued MRI of the brain given the fact that her examination and clinical picture was not very consistent with a stroke-MRI of the brain revealed fluid level within bilateral lateral ventricles occipital horns with low signal on T2 and associated restricted diffusion-likely small intraventricular hemorrhage but ventriculitis cannot be excluded.  Given the history that the son provided about her having clear fluid leakage from the nose for the past month or month and a half with off-and-on headaches which were sounding more positional  concerning for low pressure headaches, there is a chance that she could have had a direct seeding of the CSF and has ventriculitis. PCCM promptly saw the patient and intubated after consent with the son.  I appreciate the prompt assistance of the PCCM team. Due to the concern for ventriculitis, broad-spectrum meningitic antibiotics and also antiviral coverage was started along with steroids-she is also immune suppressed although she has been listed as a well-controlled HIV patient, because I cannot perform a spinal tap until at least 24 hours from IV TPA, I would treat her as broadly as I can for CNS infection. Rest of the plan as above which I have formulated. Plan discussed with PCCM attending Dr. Merrily Pew as well as the son at bedside.  -- Milon Dikes, MD Stroke Neurology Pager: [phone]   CRITICAL CARE  ATTESTATION Performed by: Milon Dikes, MD Total critical care time: 55 minutes Critical care time was exclusive of separately billable procedures and treating other patients and/or supervising APPs/Residents/Students Critical care was necessary to treat or prevent imminent or life-threatening deterioration due to IV thrombolysis for strokelike symptoms, concern for CNS infection versus intraventricular hemorrhage. This patient is critically ill and at significant risk for neurological worsening and/or death and care requires constant monitoring. Critical care was time spent personally by me on the following activities: development of treatment plan with patient and/or surrogate as well as nursing, discussions with consultants, evaluation of patient's response to treatment, examination of patient, obtaining history from patient or surrogate, ordering and performing treatments and interventions, ordering and review of laboratory studies, ordering and review of radiographic studies, pulse oximetry, re-evaluation of patient's condition, participation in multidisciplinary rounds and medical decision making of high complexity in the care of this patient.

## 2020-05-01 NOTE — Progress Notes (Signed)
SLP Cancellation Note  Patient Details Name: Gail Edwards MRN: [id number] DOB: [birth year]   Cancelled treatment:       Reason Eval/Treat Not Completed: Patient at procedure or test/unavailable   Ardyth Gal MA, CCC-SLP Acute Rehabilitation Services   05/01/2020, 11:06 AM

## 2020-05-01 NOTE — Progress Notes (Signed)
eLink Physician-Brief Progress Note Patient Name: Gail Edwards DOB: [birth year] MRN: [id number]   Date of Service  05/01/2020  HPI/Events of Note  Multiple issuse: 1. Fever to 100.1 F and 2. Agitation - Nursing request for bilateral soft wrist restraints.  Already on Ampicillin and Acyclovir for presumption of meningitis/encephalitis.   eICU Interventions  Plan: 1. Bilateral soft wrist restraints X 12 hours. 2. Blood cultures X 2.      Intervention Category Major Interventions: Other:  Lenell Antu 05/01/2020, 8:50 PM

## 2020-05-01 NOTE — Progress Notes (Signed)
eLink Physician-Brief Progress Note Patient Name: Gail Edwards DOB: 09-02-1955 MRN: [id number]   Date of Service  05/01/2020  HPI/Events of Note  Agitation - Nursing request for additional sedation for CT Scan. On mechanical ventilation and BP = 128/74.   eICU Interventions  Plan: 1. Versed 2 mg IV on call to CT Scan.      Intervention Category Major Interventions: Delirium, psychosis, severe agitation - evaluation and management  Caliph [place]ak Eugene 05/01/2020, 9:26 PM

## 2020-05-01 NOTE — Procedures (Addendum)
Intubation Procedure Note  Gail Edwards  [id number]  1955-06-06  Date:05/01/20  Time:1:36 PM   Provider Performing:Cici Rodriges F Earlene Plater    Procedure: Intubation (31500)  Indication(s) Respiratory Failure  Consent Risks of the procedure as well as the alternatives and risks of each were explained to the patient and/or caregiver.  Consent for the procedure was obtained and is signed in the bedside chart   Anesthesia Etomidate and Rocuronium   Time Out Verified patient identification, verified procedure, site/side was marked, verified correct patient position, special equipment/implants available, medications/allergies/relevant history reviewed, required imaging and test results available.   Sterile Technique Usual hand hygeine, masks, and gloves were used   Procedure Description Patient positioned in bed supine.  Sedation given as noted above.  Patient was intubated with endotracheal tube using 3 Glidescope.  View was Grade 1 full glottis .  Number of attempts was 1.  Colorimetric CO2 detector was consistent with tracheal placement.   Complications/Tolerance None; patient tolerated the procedure well. Chest X-ray is ordered to verify placement.   EBL None   Specimen(s) None   Gail Gant, NP-C [facility] Personal contact information can be found on Amion  If no response please page: Adult pulmonary and critical care medicine pager on Amion unitl 7pm After 7pm please call [phone] 05/01/2020, 1:36 PM

## 2020-05-01 NOTE — ED Notes (Signed)
Pt seemed more lethargic, was slurring words a bit and had garbled speech.  Pt was not able to answer some questions appropriately for Modified [facility] at 0045.  Dr. Otelia Limes contacted to come see pt.  He reassessed.  Pt was able to recognize son and recall some information but was less clear on other orientation questions.  He determined that sleep deprivation and the Phenergan was the likely culprit at this time.

## 2020-05-01 NOTE — Progress Notes (Signed)
PT Cancellation Note  Patient Details Name: KAYSEE HERGERT MRN: [id number] DOB: 1955/06/28   Cancelled Treatment:    Reason Eval/Treat Not Completed: Patient not medically ready this morning. tPA given @ 2131 on 3/9. New aphasia and going down for stat CT. PT will continue to follow and evaluate as appropriate.   Deland Pretty, DPT   Acute Rehabilitation Department Pager #: [phone]  Gaetana Michaelis 05/01/2020, 7:34 AM

## 2020-05-01 NOTE — Progress Notes (Incomplete)
  Echocardiogram 2D Echocardiogram has been performed with Definity.  Gerda Diss 05/01/2020, 5:30 PM

## 2020-05-01 NOTE — Progress Notes (Addendum)
0700:  Updated by PM RN that patient has a neuro change, neurology MD was notified.  RN called day shift neurology and ordered STAT CT.  STAT CT completed.  Neurology aware.    0900:  New order for STAT MRI.  RN called MRI, at this time all MRI's are occupied, and MRI team will call RN back when a room is available.   1130:  MRI completed, neurology made aware.  Neurology at bedside and updated pt's son.    1300:  Patient intubated by CCM.  New systolic blood pressure less than 160.  RN to continue to monitor.

## 2020-05-01 NOTE — Consult Note (Signed)
NAME:  Gail Edwards, MRN:  [id number], DOB:  January 01, 1956, LOS: 1 ADMISSION DATE:  04/30/2020, CONSULTATION DATE:  05/01/2020 REFERRING MD:  Dr. Wilford Corner, CHIEF COMPLAINT:  AMS  Brief History:  31 yoF with HIV presented 3/9 as code stroke with garbled speech and left facial droop.  CTH neg.  Given tPA.  On 3/10, developed worsening confusion.  MR brain concerning for small IVH vs possible ventriculitis.  Abx added.  PCCM consulted for possible intubation given progressive decline in mental status.   History of Present Illness:  HPI obtained from medical chart review and per son at bedside as patient remains encephalopathic.   65 year old female with prior history of HIV and pre-diabetes who presented on 3/9 after worsening bi-frontal headache while at church and then noted to have altered speech.  She presented to [facility] as a code stroke.  LKN was 1915.  On arrival to ER, she had intermittent garbled speech and left facial droop with BP 190/110, afebrile, and glucose 144. She is not on any home anticoagulants, ASA, or plavix.  Initial head CT negative.  She was given tPA at 2131 full dose.  Labs noted for WBC 10.7, normal coags.  She was admitted to ICU by stroke service.  On 3/10, she was noted to be more confused.  She went for MR brain.  Son arrived at bedside as well and reported that she had a very hard sneeze about 2 months ago and felt a pop in her head.  Afterwards, she had a liquid drip out of her nose for several hours.  Since, she has been having headaches, progressive till yesterday.  MR brain showed a fluid level within the occipital horn of the bilateral lateral ventricles with low signal on T2 and associated restricted diffusion suggestive of small ventricular hemorrhage versus possibility of ventriculitis.  Given she is received, tPA, unable to obtain LP at this time.  PCCM consulted given her progressive decline in mental status and concern for airway protection with tachypnea and some  intermittent apnea spells.    Past Medical History:  HIV, prediabetic   Significant Hospital Events:  3/9 Admitted Stroke service s/p tPA 3/10 worsening confusion, MRI, PCCM consult, intubated  Consults:  PCCM  Procedures:  3/10 ETT >>  Significant Diagnostic Tests:  3/9 CTH >> 1. Normal head CT. 2. ASPECTS is 10..  3/10 MR brain >> Fluid level within the occipital horn of the bilateral lateral ventricles with low signal on T2 and associated restricted diffusion. Findings are suggestive of small intraventricular hemorrhage. However, the possibility of ventriculitis CT be excluded. CSF sampling may be helpful for further evaluation.  Micro Data:  3/9 SARS / flu >> neg 3/10 MRSA >> neg  Antimicrobials:  3/10 acyclovir >> 3/10 ampicillin >> 3/10 vancomycin >> 3/10 ceftriaxone (2gm q 12hr) >>  Interim History / Subjective:  Tmax 100.1   Objective   Blood pressure (!) 160/74, pulse (!) 104, temperature 100.1 F (37.8 C), temperature source Axillary, resp. rate (!) 47, height 5\' 6"  (1.676 m), weight 105.3 kg, SpO2 97 %.        Intake/Output Summary (Last 24 hours) at 05/01/2020 1303 Last data filed at 05/01/2020 0800 Gross per 24 hour  Intake 1163.15 ml  Output 800 ml  Net 363.15 ml   Filed Weights   05/01/20 1200  Weight: 105.3 kg    Examination: General: Chronically ill appearing elderly female lying in bed encephalitic, in NAD HEENT: [place]/AT now status post  intubation ETT at 25 at the teeth, MM pink/moist, PERRL, Neuro: Moaning and groaning with physical stimuli, unable to follow simple commands, light sensitivity  CV: s1s2 regular rate and rhythm, no murmur, rubs, or gallops,  PULM:  Clear to ascultation, tachypnea GI: soft, bowel sounds active in all 4 quadrants, non-tender, non-distended Extremities: warm/dry, no edema  Skin: no rashes or lesions  Resolved Hospital Problem list     Assessment & Plan:   Acute encephalopathy concerning for CNS process  (menigintis vs ventriculitis) vs ? small IVH - initial CTH neg.  S/p tPA 3/9 at 2131 - MR brain today as above P: Neurology primary empiric meningeal coverage started- acyclovir, ampicillin, ceftriaxone, vanc Empiric steroids  Ongoing neuro exams LP would be ideal but given she is s/p tPA, unable to safely perform at this time.  Maintain neuro protective measures; goal for eurothermia, euglycemia, eunatermia, normoxia, and PCO2 goal of 35-40 Nutrition and bowel regiment  Seizure precautions  Aspirations precautions   Acute respiratory insufficiency secondary to above - given concern for impending respiratory failure, we will proceed with intubation for airway protection P: Continue ventilator support with lung protective strategies  Wean PEEP and FiO2 for sats greater than 90%. Head of bed elevated 30 degrees. Plateau pressures less than 30 cm H20.  Follow intermittent chest x-ray and ABG.   SAT/SBT as tolerated, mentation preclude extubation  Ensure adequate pulmonary hygiene  Follow cultures  VAP bundle in place  PAD protocol  HTN -BP was 190/110 on arrival  P: Telemetry  Cleviprex prn for SBP goal < 180  Pre-diabetic - A1c 5.7 P: CBG q 4, add SSI if > 180  HIV - followed by Dr. Ninetta Lights P: continue home Emtricitabine-Rilpivir-Tenofovir    Best practice (evaluated daily)  Diet: NPO, start TF Pain/Anxiety/Delirium protocol (if indicated): precedex/ prn fentanyl VAP protocol (if indicated): yes DVT prophylaxis: s/p tPA, SCDs only GI prophylaxis: PPI Glucose control: CBG q 4 Mobility: BR Disposition:Neuro ICU  Goals of Care:  Last date of multidisciplinary goals of care discussion:per primary team  Code Status: full   Labs   CBC: Recent Labs  Lab 04/30/20 2106 04/30/20 2131 05/01/20 0905  WBC 10.7*  --   --   NEUTROABS 9.6*  --   --   HGB 13.0 13.6 13.3  HCT 38.5 40.0 39.0  MCV 89.5  --   --   PLT 336  --   --     Basic Metabolic Panel: Recent  Labs  Lab 04/30/20 2106 04/30/20 2131 05/01/20 0905  NA 135 136 137  K 4.1 4.1 3.6  CL 105 104  --   CO2 20*  --   --   GLUCOSE 107* 106*  --   BUN 14 16  --   CREATININE 0.88 0.80  --   CALCIUM 9.1  --   --    GFR: Estimated Creatinine Clearance: 88.3 mL/min (by C-G formula based on SCr of 0.8 mg/dL). Recent Labs  Lab 04/30/20 2106  WBC 10.7*    Liver Function Tests: Recent Labs  Lab 04/30/20 2106  AST 20  ALT 17  ALKPHOS 89  BILITOT 1.1  PROT 6.7  ALBUMIN 3.9   No results for input(s): LIPASE, AMYLASE in the last 168 hours. Recent Labs  Lab 05/01/20 1028  AMMONIA 33    ABG    Component Value Date/Time   PHART 7.462 (H) 05/01/2020 0905   PCO2ART 23.1 (L) 05/01/2020 0905   PO2ART 76 (L) 05/01/2020  [id number]   HCO3 16.5 (L) 05/01/2020 0905   TCO2 17 (L) 05/01/2020 0905   ACIDBASEDEF 5.0 (H) 05/01/2020 0905   O2SAT 96.0 05/01/2020 0905     Coagulation Profile: Recent Labs  Lab 04/30/20 2106  INR 1.1    Cardiac Enzymes: No results for input(s): CKTOTAL, CKMB, CKMBINDEX, TROPONINI in the last 168 hours.  HbA1C: Hgb A1c MFr Bld  Date/Time Value Ref Range Status  05/01/2020 05:04 AM 5.7 (H) 4.8 - 5.6 % Final    Comment:    (NOTE) Pre diabetes:          5.7%-6.4%  Diabetes:              >6.4%  Glycemic control for   <7.0% adults with diabetes   11/20/2007 09:36 AM 5.2 %     CBG: Recent Labs  Lab 04/30/20 2156  GLUCAP 113*    Review of Systems:   Unable   Past Medical History:  She,  has a past medical history of Abnormal Pap smear (2009), HIV infection (HCC), and Pre-diabetes.   Surgical History:   Past Surgical History:  Procedure Laterality Date  . CESAREAN SECTION    . CHOLECYSTECTOMY    . HERNIA REPAIR    . ORIF HUMERUS FRACTURE Right 12/17/2016   Procedure: OPEN REDUCTION INTERNAL FIXATION (ORIF) RIGHT PROXIMAL HUMERUS FRACTURE;  Surgeon: Sheral ApleyMurphy, Timothy D, MD;  Location: [facility];  Service: Orthopedics;   Laterality: Right;     Social History:   reports that she has never smoked. She has never used smokeless tobacco. She reports that she does not drink alcohol and does not use drugs.   Family History:  Her family history includes Cancer in her father; Diabetes in her mother. There is no history of Breast cancer.   Allergies Allergies  Allergen Reactions  . Codeine Other (See Comments)    Headache     Home Medications  Prior to Admission medications   Medication Sig Start Date End Date Taking? Authorizing Provider  Cholecalciferol (VITAMIN D3) 10 MCG (400 UNIT) tablet Take 400 Units by mouth daily.   Yes [provider]  clobetasol ointment (TEMOVATE) 0.05 % APPLY 1 APPLICATION  TOPICALLY 2  TIMES DAILY 01/29/20  Yes Ginnie SmartHatcher, Jeffrey C, MD  emtricitabine-rilpivir-tenofovir AF (ODEFSEY) 200-25-25 MG TABS tablet Take 1 tablet by mouth daily with breakfast. 01/22/20  Yes Ginnie SmartHatcher, Jeffrey C, MD  temazepam (RESTORIL) 15 MG capsule TAKE 1 CAPSULE BY MOUTH  EVERY NIGHT AT BEDTIME AS  NEEDED FOR SLEEP 04/02/20  Yes Ginnie SmartHatcher, Jeffrey C, MD     Critical care time:    Performed by: Delfin GantWhitney F Soffia Doshier  Total critical care time: 45 minutes  Critical care time was exclusive of separately billable procedures and treating other patients.  Critical care was necessary to treat or prevent imminent or life-threatening deterioration.  Critical care was time spent personally by me on the following activities: development of treatment plan with patient and/or surrogate as well as nursing, discussions with consultants, evaluation of patient's response to treatment, examination of patient, obtaining history from patient or surrogate, ordering and performing treatments and interventions, ordering and review of laboratory studies, ordering and review of radiographic studies, pulse oximetry and re-evaluation of patient's condition.  Delfin GantWhitney F Ashlea Dusing, NP-C [facility] Personal contact  information can be found on Amion  If no response please page: Adult pulmonary and critical care medicine pager on Amion unitl 7pm After 7pm please call [phone] 05/01/2020,  2:08 PM

## 2020-05-01 NOTE — Progress Notes (Signed)
Pharmacy Antibiotic Note  Gail Edwards is a 65 y.o. female with hx HIV admitted on 04/30/2020 with acute ischemic stroke, s/p TPA 3/9 PM.  Pharmacy has been consulted for vancomycin/acyclovir/ceftriaxone/ampillin dosing for r/o meningitis. Unable to consider LP until >24hrs out from TPA administration.   Per discussion with Dr. Wilford Corner, low suspicion for HSV encephalitis but will add acyclovir until HSV PCR results. NS confirmed running at 75 ml/hr  SCr 0.8 on admit (last 3/9).  Plan: Ceftriaxone 2g IV q12h Vancomycin 2500mg  IV x 1; then 750mg  IV q12h Ampicillin 2g IV q4h Acyclovir 775 (10mg /kg Adj BW) IV q8h Monitor clinical progress, c/s, renal function F/u de-escalation plan/LOT, vancomycin levels as indicated      Temp (24hrs), Avg:98 F (36.7 C), Min:97.4 F (36.3 C), Max:98.8 F (37.1 C)  Recent Labs  Lab 04/30/20 2106 04/30/20 2131  WBC 10.7*  --   CREATININE 0.88 0.80    CrCl cannot be calculated (Unknown ideal weight.).    Allergies  Allergen Reactions  . Codeine Other (See Comments)    Headache    2107, PharmD, BCPS Please check AMION for all [facility] contact numbers Clinical Pharmacist 05/01/2020 12:30 PM

## 2020-05-01 NOTE — Progress Notes (Signed)
OT Cancellation Note  Patient Details Name: Gail Edwards MRN: [id number] DOB: Jan 20, 1956   Cancelled Treatment:    Reason Eval/Treat Not Completed: Active bedrest order;Medical issues which prohibited therapy (Hold per RN. tPA given @ 2131 on 3/9. New aphasia and going down for stat CT. Will return as schedule allows.)  Graclyn Lawther M Sohan Potvin Jessenya Berdan MSOT, OTR/L Acute Rehab Pager: [phone] Office: [phone] 05/01/2020, 7:32 AM

## 2020-05-02 ENCOUNTER — Telehealth: Payer: Self-pay

## 2020-05-02 DIAGNOSIS — R2990 Unspecified symptoms and signs involving the nervous system: Secondary | ICD-10-CM

## 2020-05-02 DIAGNOSIS — G0490 Encephalitis and encephalomyelitis, unspecified: Secondary | ICD-10-CM | POA: Diagnosis present

## 2020-05-02 DIAGNOSIS — G4459 Other complicated headache syndrome: Secondary | ICD-10-CM

## 2020-05-02 DIAGNOSIS — G009 Bacterial meningitis, unspecified: Principal | ICD-10-CM

## 2020-05-02 DIAGNOSIS — B20 Human immunodeficiency virus [HIV] disease: Secondary | ICD-10-CM | POA: Diagnosis not present

## 2020-05-02 DIAGNOSIS — G9341 Metabolic encephalopathy: Secondary | ICD-10-CM | POA: Diagnosis not present

## 2020-05-02 DIAGNOSIS — R9389 Abnormal findings on diagnostic imaging of other specified body structures: Secondary | ICD-10-CM | POA: Diagnosis present

## 2020-05-02 LAB — PATHOLOGIST SMEAR REVIEW

## 2020-05-02 LAB — COMPREHENSIVE METABOLIC PANEL
ALT: 15 U/L (ref 0–44)
AST: 14 U/L — ABNORMAL LOW (ref 15–41)
Albumin: 3.1 g/dL — ABNORMAL LOW (ref 3.5–5.0)
Alkaline Phosphatase: 70 U/L (ref 38–126)
Anion gap: 9 (ref 5–15)
BUN: 13 mg/dL (ref 8–23)
CO2: 19 mmol/L — ABNORMAL LOW (ref 22–32)
Calcium: 8.6 mg/dL — ABNORMAL LOW (ref 8.9–10.3)
Chloride: 106 mmol/L (ref 98–111)
Creatinine, Ser: 0.87 mg/dL (ref 0.44–1.00)
GFR, Estimated: >60 mL/min (ref >60)
Glucose, Bld: 237 mg/dL — ABNORMAL HIGH (ref 70–99)
Potassium: 3.7 mmol/L (ref 3.5–5.1)
Sodium: 134 mmol/L — ABNORMAL LOW (ref 135–145)
Total Bilirubin: 0.6 mg/dL (ref 0.3–1.2)
Total Protein: 6.2 g/dL — ABNORMAL LOW (ref 6.5–8.1)

## 2020-05-02 LAB — CBC
HCT: 34.2 % — ABNORMAL LOW (ref 36.0–46.0)
Hemoglobin: 11.8 g/dL — ABNORMAL LOW (ref 12.0–15.0)
MCH: 30.3 pg (ref 26.0–34.0)
MCHC: 34.5 g/dL (ref 30.0–36.0)
MCV: 87.7 fL (ref 80.0–100.0)
Platelets: 238 10*3/uL (ref 150–400)
RBC: 3.9 MIL/uL (ref 3.87–5.11)
RDW: 14 % (ref 11.5–15.5)
WBC: 14.6 10*3/uL — ABNORMAL HIGH (ref 4.0–10.5)
nRBC: 0 % (ref 0.0–0.2)

## 2020-05-02 LAB — HIV-1 RNA QUANT-NO REFLEX-BLD
HIV 1 RNA Quant: <20 copies/mL
LOG10 HIV-1 RNA: UNDETERMINED log10copy/mL

## 2020-05-02 LAB — PHOSPHORUS
Phosphorus: 2.7 mg/dL (ref 2.5–4.6)
Phosphorus: 1.9 mg/dL — ABNORMAL LOW (ref 2.5–4.6)

## 2020-05-02 LAB — GLUCOSE, CAPILLARY
Glucose-Capillary: 171 mg/dL — ABNORMAL HIGH (ref 70–99)
Glucose-Capillary: 172 mg/dL — ABNORMAL HIGH (ref 70–99)
Glucose-Capillary: 200 mg/dL — ABNORMAL HIGH (ref 70–99)
Glucose-Capillary: 216 mg/dL — ABNORMAL HIGH (ref 70–99)
Glucose-Capillary: 225 mg/dL — ABNORMAL HIGH (ref 70–99)
Glucose-Capillary: 228 mg/dL — ABNORMAL HIGH (ref 70–99)
Glucose-Capillary: 238 mg/dL — ABNORMAL HIGH (ref 70–99)

## 2020-05-02 LAB — MAGNESIUM
Magnesium: 1.9 mg/dL (ref 1.7–2.4)
Magnesium: 2.4 mg/dL (ref 1.7–2.4)

## 2020-05-02 LAB — CSF CELL COUNT WITH DIFFERENTIAL
Eosinophils, CSF: 1 % (ref 0–1)
Lymphs, CSF: 6 % — ABNORMAL LOW (ref 40–80)
Monocyte-Macrophage-Spinal Fluid: 6 % — ABNORMAL LOW (ref 15–45)
RBC Count, CSF: 117 /mm3 — ABNORMAL HIGH
Segmented Neutrophils-CSF: 87 % — ABNORMAL HIGH (ref 0–6)
Tube #: 3
WBC, CSF: 1780 /mm3 (ref 0–5)

## 2020-05-02 LAB — CRYPTOCOCCAL ANTIGEN, CSF: Crypto Ag: NEGATIVE

## 2020-05-02 LAB — PROTEIN AND GLUCOSE, CSF
Glucose, CSF: 79 mg/dL — ABNORMAL HIGH (ref 40–70)
Total  Protein, CSF: 197 mg/dL — ABNORMAL HIGH (ref 15–45)

## 2020-05-02 LAB — PROTIME-INR
INR: 1.3 — ABNORMAL HIGH (ref 0.8–1.2)
Prothrombin Time: 15.9 seconds — ABNORMAL HIGH (ref 11.4–15.2)

## 2020-05-02 LAB — CRYPTOCOCCAL ANTIGEN: Crypto Ag: NEGATIVE

## 2020-05-02 LAB — T-HELPER CELLS (CD4) COUNT (NOT AT ARMC)
CD4 % Helper T Cell: 29 % — ABNORMAL LOW (ref 33–65)
CD4 T Cell Abs: 121 /uL — ABNORMAL LOW (ref 400–1790)

## 2020-05-02 MED ORDER — LACTATED RINGERS IV SOLN: INTRAVENOUS | Status: DC

## 2020-05-02 MED ORDER — PROSOURCE TF PO LIQD
45.0000 mL | Freq: Three times a day (TID) | ORAL | Status: DC
  Administered 2020-05-02 (×2): 45 mL
  Filled 2020-05-02 (×3): qty 45

## 2020-05-02 MED ORDER — MAGNESIUM SULFATE 2 GM/50ML IV SOLN
2.0000 g | Freq: Once | INTRAVENOUS | Status: AC
  Administered 2020-05-02: 2 g via INTRAVENOUS
  Filled 2020-05-02: qty 50

## 2020-05-02 MED ORDER — PANTOPRAZOLE SODIUM 40 MG PO PACK
40.0000 mg | PACK | Freq: Every day | ORAL | Status: DC
  Administered 2020-05-02: 40 mg
  Filled 2020-05-02 (×2): qty 20

## 2020-05-02 MED ORDER — ADULT MULTIVITAMIN W/MINERALS CH
1.0000 | ORAL_TABLET | Freq: Every day | ORAL | Status: DC
  Administered 2020-05-02: 1
  Filled 2020-05-02 (×2): qty 1

## 2020-05-02 MED ORDER — INSULIN ASPART 100 UNIT/ML ~~LOC~~ SOLN
0.0000 [IU] | SUBCUTANEOUS | Status: DC
  Administered 2020-05-02 (×2): 5 [IU] via SUBCUTANEOUS
  Administered 2020-05-02: 3 [IU] via SUBCUTANEOUS

## 2020-05-02 MED ORDER — VITAL AF 1.2 CAL PO LIQD
1000.0000 mL | ORAL | Status: DC
  Administered 2020-05-02: 1000 mL

## 2020-05-02 MED ORDER — INSULIN ASPART 100 UNIT/ML ~~LOC~~ SOLN
0.0000 [IU] | SUBCUTANEOUS | Status: DC
  Administered 2020-05-02: 3 [IU] via SUBCUTANEOUS
  Administered 2020-05-02: 7 [IU] via SUBCUTANEOUS
  Administered 2020-05-02: 4 [IU] via SUBCUTANEOUS
  Administered 2020-05-02 – 2020-05-03 (×2): 7 [IU] via SUBCUTANEOUS
  Administered 2020-05-03: 11 [IU] via SUBCUTANEOUS
  Administered 2020-05-03 (×2): 3 [IU] via SUBCUTANEOUS
  Administered 2020-05-03: 7 [IU] via SUBCUTANEOUS
  Administered 2020-05-03: 3 [IU] via SUBCUTANEOUS
  Administered 2020-05-04: 4 [IU] via SUBCUTANEOUS
  Administered 2020-05-04: 3 [IU] via SUBCUTANEOUS
  Administered 2020-05-04: 4 [IU] via SUBCUTANEOUS
  Administered 2020-05-04: 3 [IU] via SUBCUTANEOUS
  Administered 2020-05-04 – 2020-05-05 (×3): 4 [IU] via SUBCUTANEOUS
  Administered 2020-05-05 (×3): 3 [IU] via SUBCUTANEOUS
  Administered 2020-05-05: 4 [IU] via SUBCUTANEOUS
  Administered 2020-05-07 (×2): 3 [IU] via SUBCUTANEOUS

## 2020-05-02 NOTE — Telephone Encounter (Signed)
Received Rx refill request for Restoril . Sending to MD for approval. Valarie Cones

## 2020-05-02 NOTE — Progress Notes (Addendum)
Initial Nutrition Assessment  DOCUMENTATION CODES:   Obesity unspecified  INTERVENTION:   Initiate tube feeding via OG tube: Vital AF 1.2 at 55 ml/h (1320 ml per day)  Prosource TF 45 ml TID  Provides 1704 kcal, 132 gm protein, 1069 ml free water daily  -MVI with mineral via tube daily  NUTRITION DIAGNOSIS:   Inadequate oral intake related to inability to eat as evidenced by NPO status.  GOAL:   Patient will meet greater than or equal to 90% of their needs  MONITOR:   Vent status,TF tolerance,Labs,Weight trends,I & O's  REASON FOR ASSESSMENT:   Consult Enteral/tube feeding initiation and management  ASSESSMENT:   42 YOF admitted for stroke. PMH of HIV, pre-diabetes, cholecystectomy, hernia repair, HTN, HLD.  Patient is currently intubated on ventilator support MV: 9.0 L/min Temp (24hrs), Avg:100 F (37.8 C), Min:98.8 F (37.1 C), Max:101.4 F (38.6 C) MAP: 76-91 Propofol: none  Pt's weights reviewed and show frequent fluctuations. Per chart, pt has gained 8.2% since 6/24.  Meds reviewed: Precedex  Labs reviewed: Sodium (134, low), Corrected Calcium (9.32), CBG (149-228)  UOP: 1,350 mL x 24 hrs I&O's reviewed: +2,193.6 L since admit  NUTRITION - FOCUSED PHYSICAL EXAM:  Flowsheet Row Most Recent Value  Orbital Region Moderate depletion  Upper Arm Region No depletion  Thoracic and Lumbar Region No depletion  Buccal Region Unable to assess  Temple Region No depletion  Clavicle Bone Region No depletion  Clavicle and Acromion Bone Region No depletion  Scapular Bone Region Unable to assess  Dorsal Hand Unable to assess  Patellar Region Mild depletion  Anterior Thigh Region Moderate depletion  Posterior Calf Region Mild depletion  Edema (RD Assessment) None  Hair Reviewed  Eyes Unable to assess  Mouth Unable to assess  Skin Reviewed  Nails Unable to assess       Diet Order:   Diet Order            Diet NPO time specified  Diet effective now                  EDUCATION NEEDS:   No education needs have been identified at this time  Skin:  Skin Assessment: Reviewed RN Assessment  Last BM:  Unknown.  Height:   Ht Readings from Last 1 Encounters:  05/01/20 5\' 6"  (1.676 m)    Weight:   Wt Readings from Last 1 Encounters:  05/01/20 105.3 kg    Ideal Body Weight:  61.4 kg  BMI:  Body mass index is 37.47 kg/m.  Estimated Nutritional Needs:   Kcal:  1600-1800  Protein:  120-140 g  Fluid:  >/=1.6 L    07/01/20, Dietetic Intern 05/02/2020 3:20 PM

## 2020-05-02 NOTE — Progress Notes (Signed)
Clarification regarding not starting antiplatelet.  Patient received TPA-likely given acute presentation history although in hindsight headaches were present for months. Clinical diagnosis more consistent with a bacterial ventriculitis versus meningitis. Imaging negative for stroke and clinical diagnosis also less in favor of stroke. This is the reason for not initiating antiplatelet treatment. Also there was some concern of intraventricular hemorrhage, which is less likely but in any case, for now, she is being treated for CNS infection and not stroke.   -- Milon Dikes, MD Stroke Neurology Pager: [phone]

## 2020-05-02 NOTE — Progress Notes (Signed)
Date and time results received: 05/02/20   Test: CSF Critical Value: WBC 1780  Name of Provider Notified: Wilford Corner, MD  Orders Received? Or Actions Taken?: No actions taken.

## 2020-05-02 NOTE — Consult Note (Signed)
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 (has been working from home), hx aids but long term virologic/immunologic control, admitted for chronic headache of about 2 months, recent fever/chill and worsening headache the day of admission along with dysarthria/confusion, thought to have stroke s/p tpa on presentation, meeting sepsis criteria along with imaging suggestion of meningitis/ventriculitis  Of note, had had 3 covid vaccine ffizer, last shot 04/2009  #hx aids Follows dr Johnnye Sima at [facility] seen 12/2019 [id number], HIV RNA und(11-21-19); on odefsey Compliant  #sepsis #confusion #ventriculitis Initially thought stroke, but repeat ct scan concerning for purulence of ventricles Given cd4 count recently and compliance with meds, I don't suspect exotic OI  She has had headache for 2 months so chronic process such as endemic fungi/tb/crypto is possible.  I don't know what her complement pathway status is but in a complement deficiency, bacterial meningitis/ventriculitis can present this way HIV has higher risk of lymphoma so that is in the ddx Of note, she does her labs via [facility]?   She had a trip to [place] 10/2019 and last had a dog 08/2019 so I do not think any zoonosis/vector borne disease related to those exposure are playing a role  Plan: 1. Csf labs ordered       5-10 ml for cytology, 5 ml for afb culture, 5 ml for fungal culture       Csf glucose, protein, cell count/diff       Csf bacterial culture       Csf vdrl       Csf hsv1/2 pcr, cmv/ebv pcr       Csf cryptococcal antigen        Opening/closing pressure 2.   Serum cryptococcal antigen; hiv pcr; cd4 count 3.   Endemic fungi serology, quantiferon gold 4.   Continue current empiric antimicrobial therapy as above 5.   She is maintained currently on tivicay and descovy inhouse, which is reasonable.  6.   ID team will continue to follow   I spent 60 minute reviewing data/chart, and coordinating care and >50% direct face to face time providing counseling/discussing diagnostics/treatment plan with patient        Active Problems:   Stroke (cerebrum) (HCC)   Scheduled Meds: . chlorhexidine gluconate (MEDLINE KIT)  15 mL Mouth Rinse BID  . Chlorhexidine Gluconate Cloth  6 each Topical Daily  . dexamethasone (DECADRON) injection  10 mg Intravenous Q6H  . dolutegravir  50 mg Per Tube Daily   And  . emtricitabine-tenofovir AF  1 tablet Per Tube Daily  . feeding supplement (PROSource TF)  45 mL Per Tube BID  . feeding supplement (VITAL HIGH PROTEIN)  1,000 mL Per Tube Q24H  . insulin aspart  0-20 Units Subcutaneous Q4H  . mouth rinse  15 mL Mouth Rinse 10 times per day  . pantoprazole sodium  40 mg Per Tube Daily  . rocuronium bromide  50 mg Intravenous Once  . sodium chloride flush  3 mL Intravenous Once   Continuous Infusions: . sodium chloride    . sodium chloride 10  mL/hr at 05/02/20 0600  . acyclovir 775 mg (05/02/20 3748)  . ampicillin (OMNIPEN) IV 2 g (05/02/20 0810)  . cefTRIAXone (ROCEPHIN)  IV Stopped (05/01/20 2220)  . dexmedetomidine (PRECEDEX) IV infusion 1.2 mcg/kg/hr (05/02/20 0806)  . lactated ringers    . magnesium sulfate bolus IVPB    . vancomycin Stopped (05/02/20 0420)   PRN Meds:.sodium chloride, acetaminophen **OR** acetaminophen (TYLENOL) oral liquid 160 mg/5 mL **OR** acetaminophen, fentaNYL (SUBLIMAZE) injection, labetalol, senna-docusate, temazepam  HPI: Gail Edwards is a 65 y.o. female hx hiv (dx'ed 1990s, well controlled, last cd4 650, vl undetectable 10/2019, on odefsey), chronic  2 months headache, admitted for 1 day worsening acute on chronic headache, confusion, found to have sepsis and ventriculitis on imaging  Patient currently intubated for airway protection Hx via chart and discussion with her son  She was on a trip to [place] in 10/2019 and stopped briefly in [place], [place] She did well after the trip  Roughly 2 months ago she has a sneezing/coughing spell and feels a pop in her head. Immediately noticed clear rhinorrhea which stopped after 4 hours. The day after started having headache. The headache has been intermittent since then but present most days of the week. Sometimes severe and debilitating associated with light sensitivity  No n/v, recent pna, rash, joint pain, muscle pain, weakness, abd pain, diarrhea, dysuria or other complaint (per her son who is incommunication with her regularly)  H&P mentions subjective f/c but per son not reported  She has been eatign well without weight loss  Her hiv is rather well controlled. She last saw dr Johnnye Sima 10/2019 and labs as above. She is very compliant with her ART  She had a fever and mild leukocytosis on arrival. Some dysarthria/confusion noted. Initially thought to have stroke and received tpa here. Subsequent f/u ct showed concern more for purulence in ventricle horns. She also has fever here.  Empirically started on abx including antiviral  Mri without mesial-temporal intensity  She is on minimal vent support  Lumbar puncture is pending  Per nursing team no rash   She never consumed unpasteurized dairy No etoh/drugs No travel to [place]   Review of Systems: ROS Unable to obtain as she is comatose/sedated  Past Medical History:  Diagnosis Date  . Abnormal Pap smear 2009   lsil-cin1  . HIV infection ([place])   . Pre-diabetes     Social History   Tobacco Use  . Smoking status: Never Smoker  . Smokeless tobacco: Never Used  Vaping Use  . Vaping Use: Never used  Substance Use Topics   . Alcohol use: No  . Drug use: No    Family History  Problem Relation Age of Onset  . Diabetes Mother   . Cancer Father        brain  . Breast cancer Neg Hx    Allergies  Allergen Reactions  . Codeine Other (See Comments)    Headache    OBJECTIVE: Blood pressure 135/64, pulse 60, temperature 99.3 F (37.4 C), temperature source Axillary, resp. rate 18, height _0  (1.676 m), weight 105.3 kg, SpO2 100 %.  Physical Exam Obese, appear comfortable on vent Heent: normocephalic; per; conj clear Neck supple cv rrr no mrg Lungs clear abd s/nt Ext no edema Skin no rash msk no peripheral joint swelling/warmth Neuro/psych deferred given sedation/intubation  Lab Results Lab Results  Component Value Date   WBC 14.6 (H) 05/02/2020   HGB 11.8 (L) 05/02/2020   HCT 34.2 (  L) 05/02/2020   MCV 87.7 05/02/2020   PLT 238 05/02/2020    Lab Results  Component Value Date   CREATININE 0.87 05/02/2020   BUN 13 05/02/2020   NA 134 (L) 05/02/2020   K 3.7 05/02/2020   CL 106 05/02/2020   CO2 19 (L) 05/02/2020    Lab Results  Component Value Date   ALT 15 05/02/2020   AST 14 (L) 05/02/2020   ALKPHOS 70 05/02/2020   BILITOT 0.6 05/02/2020     Microbiology: Recent Results (from the past 240 hour(s))  SARS CORONAVIRUS 2 (TAT 6-24 HRS) Nasopharyngeal Nasopharyngeal Swab     Status: None   Collection Time: 04/30/20 10:54 PM   Specimen: Nasopharyngeal Swab  Result Value Ref Range Status   SARS Coronavirus 2 NEGATIVE NEGATIVE Final    Comment: (NOTE) SARS-CoV-2 target nucleic acids are NOT DETECTED.  The SARS-CoV-2 RNA is generally detectable in upper and lower respiratory specimens during the acute phase of infection. Negative results do not preclude SARS-CoV-2 infection, do not rule out co-infections with other pathogens, and should not be used as the sole basis for treatment or other patient management decisions. Negative results must be combined with clinical  observations, patient history, and epidemiological information. The expected result is Negative.  Fact Sheet for Patients: SugarRoll.be  Fact Sheet for Healthcare Providers: https://www.woods-mathews.com/  This test is not yet approved or cleared by the [place] FDA and  has been authorized for detection and/or diagnosis of SARS-CoV-2 by FDA under an Emergency Use Authorization (EUA). This EUA will remain  in effect (meaning this test can be used) for the duration of the COVID-19 declaration under Se ction 564(b)(1) of the Act, 21 U.S.C. section 360bbb-3(b)(1), unless the authorization is terminated or revoked sooner.  Performed at [facility], [place] [address], [place], [place] [address]   MRSA PCR Screening     Status: None   Collection Time: 05/01/20  6:38 AM   Specimen: Nasal Mucosa; Nasopharyngeal  Result Value Ref Range Status   MRSA by PCR NEGATIVE NEGATIVE Final    Comment:        The GeneXpert MRSA Assay (FDA approved for NASAL specimens only), is one component of a comprehensive MRSA colonization surveillance program. It is not intended to diagnose MRSA infection nor to guide or monitor treatment for MRSA infections. Performed at [facility], [place] [address] [place], [place] [address]      Serology:  HIV:       vl    /    cd4 (%) 10/2011  <20  / 02/2010  <20  /  Micro: 05/02/20 Blood cx ngtd  Imaging: If present, new imagings (plain films, ct scans, and mri) have been personally visualized and interpreted; radiology reports have been reviewed. Decision making incorporated into the Impression / Recommendations.  3/10 head ct Abnormal appearance of the occipital horns on MRI yesterday, and on this CT the horns - which were readily visible at 0753 hours (series 3 image 13 on that exam) - are no longer evident and therefore likely opacified with isodense material. We discussed suspicion of  meningitis/ventriculitis with debris more so than intraventricular blood. This patient has underlying HIV, and her WBC is increasing. CSF sampling and analysis is planned.  3/11 mri head Fluid level within the occipital horn of the bilateral lateral ventricles with low signal on T2 and associated restricted diffusion. Findings are suggestive of small intraventricular hemorrhage. However, the possibility of ventriculitis CT  be excluded. CSF sampling may be helpful for further evaluation.  3/10 cxr 1. Endotracheal tube and nasogastric tube in appropriate position. 2. Mild pulmonary vascular congestion with mild left basilar atelectasis.  Jabier Mutton, [facility] [place] [phone] pager    05/02/2020, 10:11 AM

## 2020-05-02 NOTE — Progress Notes (Signed)
NAME:  Gail Edwards, MRN:  [id number], DOB:  26-Jun-1955, LOS: 2 ADMISSION DATE:  04/30/2020, CONSULTATION DATE:  05/01/2020 REFERRING MD:  Dr. Wilford Corner, CHIEF COMPLAINT:  AMS  Brief History:  56 yoF with HIV presented 3/9 as code stroke with garbled speech and left facial droop.  CTH neg.  Given tPA.  On 3/10, developed worsening confusion.  MR brain concerning for small IVH vs possible ventriculitis.  Abx added.  PCCM consulted for possible intubation given progressive decline in mental status.   Past Medical History:  HIV, prediabetic   Significant Hospital Events:  3/9 Admitted Stroke service s/p tPA 3/10 worsening confusion, MRI, PCCM consult, intubated  Consults:  PCCM  Procedures:  3/10 ETT >>  Significant Diagnostic Tests:  3/9 CTH >> 1. Normal head CT. 2. ASPECTS is 10..  3/10 MR brain >> Fluid level within the occipital horn of the bilateral lateral ventricles with low signal on T2 and associated restricted diffusion. Findings are suggestive of small intraventricular hemorrhage. However, the possibility of ventriculitis CT be excluded. CSF sampling may be helpful for further evaluation.  Micro Data:  3/9 SARS / flu >> neg 3/10 MRSA >> neg  Antimicrobials:  3/10 acyclovir >> 3/10 ampicillin >> 3/10 vancomycin >> 3/10 ceftriaxone (2gm q 12hr) >>  Interim History / Subjective:  Patient remained agitated overnight, requiring initiation of Versed infusion.  Patient's son is consented for LP today   Objective   Blood pressure 135/64, pulse 60, temperature 99.3 F (37.4 C), temperature source Axillary, resp. rate 18, height 5\' 6"  (1.676 m), weight 105.3 kg, SpO2 100 %.    Vent Mode: PSV;CPAP FiO2 (%):  [40 %-50 %] 40 % Set Rate:  [18 bmp] 18 bmp Vt Set:  [470 mL] 470 mL PEEP:  [5 cmH20] 5 cmH20 Pressure Support:  [10 cmH20] 10 cmH20 Plateau Pressure:  [16 cmH20-19 cmH20] 18 cmH20   Intake/Output Summary (Last 24 hours) at 05/02/2020 1022 Last data filed at  05/02/2020 0600 Gross per 24 hour  Intake 3080.5 ml  Output 1350 ml  Net 1730.5 ml   Filed Weights   05/01/20 1200  Weight: 105.3 kg    Examination: General: Chronically ill appearing elderly female, lying in bed, orally intubated HEENT: [place]/AT,  ETT at 25 at the teeth, MM pink/moist, PERRL, Neuro: Eyes closed, not following commands, moving all 4 extremities spontaneously CV: s1s2 regular rate and rhythm, no murmur, rubs, or gallops,  PULM:  Clear to ascultation, tachypnea, no wheezes or rhonchi GI: soft, bowel sounds active in all 4 quadrants, non-tender, non-distended Extremities: warm/dry, no edema  Skin: no rashes or lesions  Resolved Hospital Problem list     Assessment & Plan:  Acute encephalopathy concerning for CNS process (menigintis vs ventriculitis)  Appreciate neurology input Patient will be going for LP today Continue acyclovir, ampicillin, ceftriaxone, vanc Empiric steroids  Continue neuro check every 1 hour Nutrition and bowel regiment  Seizure precautions  Aspirations precautions   Acute respiratory failure with hypoxia Patient was intubated yesterday due to altered mental status and tach apnea Continue ventilator support with lung protective strategies  Wean PEEP and FiO2 for sats greater than 90%. Head of bed elevated 30 degrees. Plateau pressures less than 30 cm H20.  Follow intermittent chest x-ray and ABG.   SAT/SBT as tolerated, mentation preclude extubation  Ensure adequate pulmonary hygiene  Follow cultures  VAP bundle in place  PAD protocol  Hyponatremia Serum sodium is 134 Closely monitor  HIV - followed  by Dr. Ninetta Lights continue home Emtricitabine-Rilpivir-Tenofovir   Morbid obesity Nutritionist follow-up   Best practice (evaluated daily)  Diet: Tube feeds Pain/Anxiety/Delirium protocol (if indicated): precedex/ prn fentanyl VAP protocol (if indicated): yes DVT prophylaxis: s/p tPA, SCDs only GI prophylaxis: PPI Glucose  control: CBG q 4 Mobility: BR Disposition:Neuro ICU  Goals of Care:  Last date of multidisciplinary goals of care discussion:per primary team  Code Status: full   Labs   CBC: Recent Labs  Lab 04/30/20 2106 04/30/20 2131 05/01/20 0905 05/01/20 1418 05/02/20 0031  WBC 10.7*  --   --   --  14.6*  NEUTROABS 9.6*  --   --   --   --   HGB 13.0 13.6 13.3 12.2 11.8*  HCT 38.5 40.0 39.0 36.0 34.2*  MCV 89.5  --   --   --  87.7  PLT 336  --   --   --  238    Basic Metabolic Panel: Recent Labs  Lab 04/30/20 2106 04/30/20 2131 05/01/20 0905 05/01/20 1405 05/01/20 1418 05/01/20 1700 05/02/20 0031  NA 135 136 137  --  135  --  134*  K 4.1 4.1 3.6  --  3.2*  --  3.7  CL 105 104  --   --   --   --  106  CO2 20*  --   --   --   --   --  19*  GLUCOSE 107* 106*  --   --   --   --  237*  BUN 14 16  --   --   --   --  13  CREATININE 0.88 0.80  --   --   --   --  0.87  CALCIUM 9.1  --   --   --   --   --  8.6*  MG  --   --   --  1.8  --  1.8 1.9  PHOS  --   --   --  1.9*  --  2.8 2.7   GFR: Estimated Creatinine Clearance: 81.2 mL/min (by C-G formula based on SCr of 0.87 mg/dL). Recent Labs  Lab 04/30/20 2106 05/02/20 0031  WBC 10.7* 14.6*    Liver Function Tests: Recent Labs  Lab 04/30/20 2106 05/02/20 0031  AST 20 14*  ALT 17 15  ALKPHOS 89 70  BILITOT 1.1 0.6  PROT 6.7 6.2*  ALBUMIN 3.9 3.1*   No results for input(s): LIPASE, AMYLASE in the last 168 hours. Recent Labs  Lab 05/01/20 1028  AMMONIA 33    ABG    Component Value Date/Time   PHART 7.360 05/01/2020 1418   PCO2ART 36.0 05/01/2020 1418   PO2ART 136 (H) 05/01/2020 1418   HCO3 20.3 05/01/2020 1418   TCO2 21 (L) 05/01/2020 1418   ACIDBASEDEF 5.0 (H) 05/01/2020 1418   O2SAT 99.0 05/01/2020 1418     Coagulation Profile: Recent Labs  Lab 04/30/20 2106 05/02/20 0752  INR 1.1 1.3*    Cardiac Enzymes: No results for input(s): CKTOTAL, CKMB, CKMBINDEX, TROPONINI in the last 168  hours.  HbA1C: Hgb A1c MFr Bld  Date/Time Value Ref Range Status  05/01/2020 05:04 AM 5.7 (H) 4.8 - 5.6 % Final    Comment:    (NOTE) Pre diabetes:          5.7%-6.4%  Diabetes:              >6.4%  Glycemic control for   <7.0% adults with diabetes  11/20/2007 09:36 AM 5.2 %     CBG: Recent Labs  Lab 05/01/20 1523 05/01/20 1959 05/02/20 0015 05/02/20 0342 05/02/20 0724  GLUCAP 159* 184* 228* 216* 200*    Past Medical History:  She,  has a past medical history of Abnormal Pap smear (2009), HIV infection (HCC), and Pre-diabetes.   Surgical History:   Past Surgical History:  Procedure Laterality Date  . CESAREAN SECTION    . CHOLECYSTECTOMY    . HERNIA REPAIR    . ORIF HUMERUS FRACTURE Right 12/17/2016   Procedure: OPEN REDUCTION INTERNAL FIXATION (ORIF) RIGHT PROXIMAL HUMERUS FRACTURE;  Surgeon: Sheral Apley, MD;  Location: [facility];  Service: Orthopedics;  Laterality: Right;     Social History:   reports that she has never smoked. She has never used smokeless tobacco. She reports that she does not drink alcohol and does not use drugs.   Family History:  Her family history includes Cancer in her father; Diabetes in her mother. There is no history of Breast cancer.   Allergies Allergies  Allergen Reactions  . Codeine Other (See Comments)    Headache     Home Medications  Prior to Admission medications   Medication Sig Start Date End Date Taking? Authorizing Provider  Cholecalciferol (VITAMIN D3) 10 MCG (400 UNIT) tablet Take 400 Units by mouth daily.   Yes [provider]  clobetasol ointment (TEMOVATE) 0.05 % APPLY 1 APPLICATION  TOPICALLY 2  TIMES DAILY 01/29/20  Yes Ginnie Smart, MD  emtricitabine-rilpivir-tenofovir AF (ODEFSEY) 200-25-25 MG TABS tablet Take 1 tablet by mouth daily with breakfast. 01/22/20  Yes Ginnie Smart, MD  temazepam (RESTORIL) 15 MG capsule TAKE 1 CAPSULE BY MOUTH  EVERY NIGHT AT BEDTIME AS   NEEDED FOR SLEEP 04/02/20  Yes Ginnie Smart, MD     Critical care time:    Total critical care time: 41 minutes  Performed by: Cheri Fowler   Critical care time was exclusive of separately billable procedures and treating other patients.   Critical care was necessary to treat or prevent imminent or life-threatening deterioration.   Critical care was time spent personally by me on the following activities: development of treatment plan with patient and/or surrogate as well as nursing, discussions with consultants, evaluation of patient's response to treatment, examination of patient, obtaining history from patient or surrogate, ordering and performing treatments and interventions, ordering and review of laboratory studies, ordering and review of radiographic studies, pulse oximetry and re-evaluation of patient's condition.   Cheri Fowler MD [facility] See Amion for pager If no response to pager, please call [phone] until 7pm After 7pm, Please call E-link [phone]

## 2020-05-02 NOTE — Procedures (Signed)
LUMBAR PUNCTURE (SPINAL TAP) PROCEDURE NOTE  Indication:  Cerebral ventriculitis   Proceduralists: Milon Dikes MD Assisted by Arna Snipe, DO - resident physician, bedside RN Bartholomew Crews.   Risks of the procedure were dicussed with the patient's son including post-LP headache, bleeding, infection, weakness/numbness of legs(radiculopathy), death.    Consent obtained from: Patient's son Mr. Eyvonne Left.   Procedure Note The patient was prepped and draped, and using sterile technique a 20 gauge quinke spinal needle was inserted in the L4-5 space.    Multiple attempts before fluid return. Opening pressure was  30 cm H2O with the caveat -she was on the ventilator. Approximately  14 cc of turbid CSF were obtained and sent for analysis.  Patient tolerated the procedure well and blood loss was minimal. CSF bottles and lab requisitions were hand dropped to the lab.   -- Milon Dikes, MD Stroke Neurology Pager: [phone]

## 2020-05-02 NOTE — Progress Notes (Addendum)
CSF preliminary results Appearance: Colorless but hazy Glucose 79 Total protein 197 RBC 117 WBC 1780  Continue current antibiotics Discontinue acyclovir although I have ordered an HSV PCR which is less likely to be positive. Discussed with ID.  General neurology/neuro hospitalist service will follow.  PCCM to assume primary.  Discussed with Dr. Merrily Pew  In hindsight, thus probably was always a CNS infection that mimicked stroke on presentation and very astutely was treated with IV tPA at the time with the given history and her exam. CT findings likely debris from CNS inection vs pus and not likely to be IVH.  MRI negative for stroke.  -- Milon Dikes, MD Stroke Neurology Pager: [phone]

## 2020-05-02 NOTE — Progress Notes (Signed)
PT Cancellation Note  Patient Details Name: LAIKEN SANDY MRN: [id number] DOB: 10-Aug-1955   Cancelled Treatment:    Reason Eval/Treat Not Completed: Patient not medically ready;Active bedrest order this morning. PT will continue to follow and evaluate as appropriate.   Deland Pretty, DPT   Acute Rehabilitation Department Pager #: [phone]   Gaetana Michaelis 05/02/2020, 7:36 AM

## 2020-05-02 NOTE — Progress Notes (Signed)
eLink Physician-Brief Progress Note Patient Name: KIMAYA WHITLATCH DOB: 1955/03/22 MRN: [id number]   Date of Service  05/02/2020  HPI/Events of Note  Hyperglycemia - Blood glucose = 228  eICU Interventions  Plan: 1. Q 4 hour moderate Novolog SSI.     Intervention Category Major Interventions: Hyperglycemia - active titration of insulin therapy  Ruchel Brandenburger Eugene 05/02/2020, 1:02 AM

## 2020-05-02 NOTE — Progress Notes (Signed)
OT Cancellation Note  Patient Details Name: Gail Edwards MRN: [id number] DOB: 29-Apr-1955   Cancelled Treatment:    Reason Eval/Treat Not Completed: Active bedrest order;Medical issues which prohibited therapy OT order received and appreciated however this conflicts with current bedrest order set. Please increase activity tolerance as appropriate and remove bedrest from orders. . Please contact OT at [phone] if bed rest order is discontinued. OT will hold evaluation at this time and will check back as time allows pending increased activity orders.   Wynona Neat, OTR/L  Acute Rehabilitation Services Pager: [phone] Office: [phone] .  05/02/2020, 8:48 AM

## 2020-05-02 NOTE — Progress Notes (Incomplete)
Initial Nutrition Assessment  DOCUMENTATION CODES:      INTERVENTION:  ***   NUTRITION DIAGNOSIS:     related to   as evidenced by  .  GOAL:      MONITOR:      REASON FOR ASSESSMENT:   Consult Enteral/tube feeding initiation and management  ASSESSMENT:      Patient is currently intubated on ventilator support MV: *** L/min Temp (24hrs), Avg:99.9 F (37.7 C), Min:98.8 F (37.1 C), Max:101.4 F (38.6 C)  Propofol: *** ml/hr Medications reviewed and include: *** Labs reviewed: ***   NUTRITION - FOCUSED PHYSICAL EXAM:  {RD Focused Exam List:21252}  Diet Order:   Diet Order            Diet NPO time specified  Diet effective now                 EDUCATION NEEDS:      Skin:     Last BM:     Height:   Ht Readings from Last 1 Encounters:  05/01/20 5\' 6"  (1.676 m)    Weight:   Wt Readings from Last 1 Encounters:  05/01/20 105.3 kg    Ideal Body Weight:     BMI:  Body mass index is 37.47 kg/m.  Estimated Nutritional Needs:   Kcal:     Protein:     Fluid:       ***

## 2020-05-02 NOTE — Progress Notes (Signed)
STROKE TEAM PROGRESS NOTE  Admission HPI  Gail Edwards is an 65 y.o. female with a PMHx of HIV and prediabetes presenting to the ED as a Code Stroke. LKN was 7:15 PM, but she had had a worsening bifrontal nonthrobbing headache since 4 PM. While at church, her headache continued to worsen. After the service, she spoke with some people while going back to her car and she was noted to have altered speech. EMS was called and on arrival they noted intermittent garbled speech and left facial droop. Members of her congregation had noted LUE weakness, but EMS did not appreciate this. Her car was parked crooked per EMS, so they suspect that her symptoms may have been present at the time of her initial arrival to church, but per congregants she was at her normal baseline during the service. She initially had been speaking normally to congregants, with TOSO noted by them to be 7:15 PM. EMS noted her speech and facial droop to wax and wane in concert during initial assessment and transport. BP was 190/110 and CBG was 144. On arrival to the ED, she continued to having intermittent garbled speech, but comprehension was intact.  She is not taking ASA, Plavix or an anticoagulant.    INTERVAL HISTORY Intubated 3/10 1336 d/t hypoxic respiratory failure.  Sedated on versed and precedex. Tmax 101.6, leukocytosis at 14,000.  MRI with no stroke.  24-hour post TPA CT with questionable hyperdensity in the posterior horn of the left lateral ventricle.  No bleed in the parenchyma.  Aspirin held. Concern for CNS infection given the imaging findings LP planned today-son at bedside-consented for the procedure-consent in file.  Patient is opening eyes, does not focus on examiner, does not follow commands but spontaneously moving all 4 extremities  Vitals:   05/02/20 0500 05/02/20 0600 05/02/20 0744 05/02/20 0800  BP: 139/65 135/64    Pulse: 61 61 60   Resp: 20 20 18    Temp:    99.3 F (37.4 C)  TempSrc:    Axillary   SpO2: 100% 100% 100%   Weight:      Height:       CBC:  Recent Labs  Lab 04/30/20 2106 04/30/20 2131 05/01/20 1418 05/02/20 0031  WBC 10.7*  --   --  14.6*  NEUTROABS 9.6*  --   --   --   HGB 13.0   < > 12.2 11.8*  HCT 38.5   < > 36.0 34.2*  MCV 89.5  --   --  87.7  PLT 336  --   --  238   < > = values in this interval not displayed.   Basic Metabolic Panel:  Recent Labs  Lab 04/30/20 2106 04/30/20 2131 05/01/20 0905 05/01/20 1418 05/01/20 1700 05/02/20 0031  NA 135 136   < > 135  --  134*  K 4.1 4.1   < > 3.2*  --  3.7  CL 105 104  --   --   --  106  CO2 20*  --   --   --   --  19*  GLUCOSE 107* 106*  --   --   --  237*  BUN 14 16  --   --   --  13  CREATININE 0.88 0.80  --   --   --  0.87  CALCIUM 9.1  --   --   --   --  8.6*  MG  --   --    < >  --  1.8 1.9  PHOS  --   --    < >  --  2.8 2.7   < > = values in this interval not displayed.   Lipid Panel:  Recent Labs  Lab 05/01/20 0509  CHOL 133  TRIG 27  HDL 46  CHOLHDL 2.9  VLDL 5  LDLCALC 82   HgbA1c:  Recent Labs  Lab 05/01/20 0504  HGBA1C 5.7*   Urine Drug Screen: No results for input(s): LABOPIA, COCAINSCRNUR, LABBENZ, AMPHETMU, THCU, LABBARB in the last 168 hours.  Alcohol Level No results for input(s): ETH in the last 168 hours.  Ammonia 33 3/11  MRSA screen PCR negative 3/11 Coronavirus negative 3/11  IMAGING past 24 hours CT HEAD WO CONTRAST  Addendum Date: 05/02/2020   ADDENDUM REPORT: 05/02/2020 07:22 ADDENDUM: Study discussed by telephone with Dr. Amie Portland on 05/02/2020 at 0715 hours. He and I both note the abnormal appearance of the occipital horns on MRI yesterday, and on this CT the horns - which were readily visible at 0753 hours (series 3 image 13 on that exam) - are no longer evident and therefore likely opacified with isodense material. We discussed suspicion of meningitis/ventriculitis with debris more so than intraventricular blood. This patient has underlying HIV, and her  WBC is increasing. CSF sampling and analysis is planned. Electronically Signed   By: Genevie Ann M.D.   On: 05/02/2020 07:22   Result Date: 05/02/2020 CLINICAL DATA:  Stroke follow-up. EXAM: CT HEAD WITHOUT CONTRAST TECHNIQUE: Contiguous axial images were obtained from the base of the skull through the vertex without intravenous contrast. COMPARISON:  May 01, 2020 (7:52 a.m.) FINDINGS: Brain: No evidence of acute infarction, hemorrhage, hydrocephalus, extra-axial collection or mass lesion/mass effect. Vascular: No hyperdense vessel or unexpected calcification. Skull: Normal. Negative for fracture or focal lesion. Sinuses/Orbits: Mild right maxillary sinus and bilateral ethmoid sinus mucosal thickening is seen. Other: Endotracheal and orogastric tubes are in place. IMPRESSION: Stable exam without acute intracranial abnormality. Electronically Signed: By: Virgina Norfolk M.D. On: 05/01/2020 23:00   MR BRAIN WO CONTRAST  Result Date: 05/01/2020 CLINICAL DATA:  Stroke follow-up. EXAM: MRI HEAD WITHOUT CONTRAST TECHNIQUE: Multiplanar, multiecho pulse sequences of the brain and surrounding structures were obtained without intravenous contrast. COMPARISON:  Head CT May 01, 2020 FINDINGS: Brain: No acute infarction, hydrocephalus or mass lesion. Fluid level within the occipital horn of the bilateral lateral ventricles with low signal on T2 and associated restricted diffusion. There is also hyperintensity of the cerebral sulci in the parietooccipital region. Vascular: Normal flow voids. Skull and upper cervical spine: Cervical spine is obscured by artifact. Normal marrow signal in the calvarium. Sinuses/Orbits: Mucosal thickening of the right maxillary sinus and bilateral ethmoid cells. IMPRESSION: Fluid level within the occipital horn of the bilateral lateral ventricles with low signal on T2 and associated restricted diffusion. Findings are suggestive of small intraventricular hemorrhage. However, the possibility of  ventriculitis CT be excluded. CSF sampling may be helpful for further evaluation. These results were called by telephone at the time of interpretation on 05/01/2020 at 11:56 am to provider [facility] , who verbally acknowledged these results. Electronically Signed   By: Pedro Earls M.D.   On: 05/01/2020 11:55   DG Chest Port 1 View  Result Date: 05/01/2020 CLINICAL DATA:  Hump of adipose tissue on her upper back. EXAM: PORTABLE CHEST 1 VIEW COMPARISON:  May 01, 2020 FINDINGS: An endotracheal tube is seen with its distal tip approximately 1.8 cm from  the carina. A nasogastric tube is noted with its distal tip overlying the expected region of the gastric fundus. Mild, chronic appearing increased lung markings are seen with mild areas of atelectasis noted along the lateral aspect of the left lung base. There is no evidence of a pleural effusion or pneumothorax. Perihilar prominence of the pulmonary vasculature is seen. The heart size and mediastinal contours are within normal limits. Degenerative changes are seen throughout the thoracic spine. IMPRESSION: 1. Endotracheal tube and nasogastric tube in appropriate position. 2. Mild pulmonary vascular congestion with mild left basilar atelectasis. Electronically Signed   By: Virgina Norfolk M.D.   On: 05/01/2020 14:53   ECHOCARDIOGRAM COMPLETE  Result Date: 05/01/2020    ECHOCARDIOGRAM REPORT   Patient Name:   Gail Edwards Date of Exam: 05/01/2020 Medical Rec #:  [id number]      Height:       66.0 in Accession #:    [id number]     Weight:       232.1 lb Date of Birth:  Jul 20, 1955      BSA:          2.131 m Patient Age:    33 years       BP:           160/74 mmHg Patient Gender: F              HR:           82 bpm. Exam Location:  Inpatient Procedure: 2D Echo, Cardiac Doppler, Color Doppler and Intracardiac            Opacification Agent Indications:    Stroke  History:        Patient has no prior history of Echocardiogram examinations.                  Risk Factors:Hypertension and Dyslipidemia. HIV.  Sonographer:    Clayton Lefort RDCS (AE) Referring Phys: [place]  1. Left ventricular ejection fraction, by estimation, is 55 to 60%. The left ventricle has normal function. The left ventricle has no regional wall motion abnormalities. There is mild concentric left ventricular hypertrophy. Left ventricular diastolic parameters were normal.  2. Right ventricular systolic function is normal. The right ventricular size is normal. There is normal pulmonary artery systolic pressure.  3. The mitral valve is normal in structure. Trivial mitral valve regurgitation. No evidence of mitral stenosis.  4. The aortic valve is tricuspid. There is mild calcification of the aortic valve. Aortic valve regurgitation is not visualized. Mild aortic valve sclerosis is present, with no evidence of aortic valve stenosis. Comparison(s): No prior Echocardiogram. Conclusion(s)/Recommendation(s): Normal biventricular function without evidence of hemodynamically significant valvular heart disease. No intracardiac source of embolism detected on this transthoracic study. A transesophageal echocardiogram is recommended to exclude cardiac source of embolism if clinically indicated. FINDINGS  Left Ventricle: Left ventricular ejection fraction, by estimation, is 55 to 60%. The left ventricle has normal function. The left ventricle has no regional wall motion abnormalities. Definity contrast agent was given IV to delineate the left ventricular  endocardial borders. The left ventricular internal cavity size was normal in size. There is mild concentric left ventricular hypertrophy. Left ventricular diastolic parameters were normal. Right Ventricle: The right ventricular size is normal. No increase in right ventricular wall thickness. Right ventricular systolic function is normal. There is normal pulmonary artery systolic pressure. The tricuspid regurgitant velocity is 2.32 m/s,  and  with an assumed right atrial  pressure of 8 mmHg, the estimated right ventricular systolic pressure is 77.3 mmHg. Left Atrium: Left atrial size was normal in size. Right Atrium: Right atrial size was normal in size. Pericardium: There is no evidence of pericardial effusion. Presence of pericardial fat pad. Mitral Valve: The mitral valve is normal in structure. Trivial mitral valve regurgitation. No evidence of mitral valve stenosis. MV peak gradient, 6.4 mmHg. The mean mitral valve gradient is 2.0 mmHg. Tricuspid Valve: The tricuspid valve is normal in structure. Tricuspid valve regurgitation is trivial. No evidence of tricuspid stenosis. Aortic Valve: The aortic valve is tricuspid. There is mild calcification of the aortic valve. Aortic valve regurgitation is not visualized. Mild aortic valve sclerosis is present, with no evidence of aortic valve stenosis. Aortic valve mean gradient measures 7.0 mmHg. Aortic valve peak gradient measures 12.5 mmHg. Aortic valve area, by VTI measures 2.45 cm. Pulmonic Valve: The pulmonic valve was not well visualized. Pulmonic valve regurgitation is not visualized. Aorta: The aortic root, ascending aorta and aortic arch are all structurally normal, with no evidence of dilitation or obstruction. Venous: IVC assessment for right atrial pressure unable to be performed due to mechanical ventilation. IAS/Shunts: The atrial septum is grossly normal.  LEFT VENTRICLE PLAX 2D LVIDd:         4.10 cm  Diastology LVIDs:         2.80 cm  LV e' medial:    7.94 cm/s LV PW:         1.30 cm  LV E/e' medial:  10.4 LV IVS:        1.40 cm  LV e' lateral:   10.10 cm/s LVOT diam:     2.10 cm  LV E/e' lateral: 8.2 LV SV:         80 LV SV Index:   38 LVOT Area:     3.46 cm  RIGHT VENTRICLE             IVC RV Basal diam:  2.80 cm     IVC diam: 2.20 cm RV S prime:     14.30 cm/s TAPSE (M-mode): 2.4 cm LEFT ATRIUM             Index       RIGHT ATRIUM           Index LA diam:        3.80 cm 1.78 cm/m  RA  Area:     13.60 cm LA Vol (A2C):   55.1 ml 25.86 ml/m RA Volume:   30.70 ml  14.41 ml/m LA Vol (A4C):   56.8 ml 26.66 ml/m LA Biplane Vol: 56.3 ml 26.42 ml/m  AORTIC VALVE AV Area (Vmax):    2.62 cm AV Area (Vmean):   2.40 cm AV Area (VTI):     2.45 cm AV Vmax:           177.00 cm/s AV Vmean:          126.000 cm/s AV VTI:            0.328 m AV Peak Grad:      12.5 mmHg AV Mean Grad:      7.0 mmHg LVOT Vmax:         134.00 cm/s LVOT Vmean:        87.300 cm/s LVOT VTI:          0.232 m LVOT/AV VTI ratio: 0.71  AORTA Ao Root diam: 3.10 cm Ao Asc diam:  3.40 cm MITRAL VALVE  TRICUSPID VALVE MV Area (PHT): 3.27 cm     TR Peak grad:   21.5 mmHg MV Area VTI:   2.31 cm     TR Vmax:        232.00 cm/s MV Peak grad:  6.4 mmHg MV Mean grad:  2.0 mmHg     SHUNTS MV Vmax:       1.26 m/s     Systemic VTI:  0.23 m MV Vmean:      65.6 cm/s    Systemic Diam: 2.10 cm MV Decel Time: 232 msec MV E velocity: 82.50 cm/s MV A velocity: 101.00 cm/s MV E/A ratio:  0.82 Buford Dresser MD Electronically signed by Buford Dresser MD Signature Date/Time: 05/01/2020/8:15:58 PM    Final    US THYROID  Result Date: 05/01/2020 CLINICAL DATA:  Thyromegaly EXAM: THYROID ULTRASOUND TECHNIQUE: Ultrasound examination of the thyroid gland and adjacent soft tissues was performed. COMPARISON:  None. FINDINGS: Parenchymal Echotexture: Moderately heterogeneous Isthmus: 1.2 cm Right lobe: 5.4 x 3.1 x 2.6 cm Left lobe: 4.5 x 2.2 x 1.8 cm _________________________________________________________ Estimated total number of nodules >/= 1 cm: 2 Number of spongiform nodules >/=  2 cm not described below (TR1): 0 Number of mixed cystic and solid nodules >/= 1.5 cm not described below (TR2): 0 _________________________________________________________ Nodule # 1: Location: Right; inferior Maximum size: 4.4 cm; Other 2 dimensions: 3.2 x 2.7 cm Composition: solid/almost completely solid (2) Echogenicity: hypoechoic (2) Shape:  taller-than-wide (3) Margins: smooth (0) Echogenic foci: none (0) ACR TI-RADS total points: 7. ACR TI-RADS risk category: TR5 (>/= 7 points). ACR TI-RADS recommendations: **Given size (>/= 1.0 cm) and appearance, fine needle aspiration of this highly suspicious nodule should be considered based on TI-RADS criteria. _________________________________________________________ Nodule # 2: Location: Left; mid Maximum size: 1.0 cm; Other 2 dimensions: 0.9 x 0.7 cm Composition: solid/almost completely solid (2) Echogenicity: hyperechoic (1) Shape: not taller-than-wide (0) Margins: smooth (0) Echogenic foci: None ACR TI-RADS total points: 3. ACR TI-RADS risk category: TR3 (3 points). ACR TI-RADS recommendations: Given size (<1.4 cm) and appearance, this nodule does NOT meet TI-RADS criteria for biopsy or dedicated follow-up. _________________________________________________________ IMPRESSION: Nodule 1 (TI-RADS 5) located in the inferior right thyroid lobe, measuring 4.4 x 3.2 x 2.7 cm, meets criteria for FNA. The above is in keeping with the ACR TI-RADS recommendations - J Am Coll Radiol 2017;14:587-595. Electronically Signed   By: Miachel Roux M.D.   On: 05/01/2020 13:54    PHYSICAL EXAM Blood pressure 135/64, pulse 60, temperature 99.3 F (37.4 C), temperature source Axillary, resp. rate 18, height 5' 6"  (1.676 m), weight 105.3 kg, SpO2 100 %.  General: alert and awake, obese caucasian female, moaning but unable to express any particular area  ungs: Symmetrical Chest rise, no labored breathing Cardio: Regular Rate and Rhythm Abdomen: Soft, non-tender Neurological exam: Sedation was turned off this morning-she was on Versed 3 mg/h. Upon holding sedation, she opens eyes spontaneously.  Attempts to track but is unable to follow any commands. Nonverbal Remains intubated Cranial nerves: Pupils equal round react light, blinks to threat from both sides, difficult to ascertain facial symmetry but looks grossly  symmetric. Motor exam: Spontaneously moves all 4 extremities and does not appear focally weak. Sensory exam: Grimace and withdrawal to noxious stimulation in all fours. Coordination and gait cannot be assessed   Current Facility-Administered Medications:  .  [COMPLETED] alteplase (ACTIVASE) 1 mg/mL infusion 90 mg, 90 mg, Intravenous, Once, Stopped at 04/30/20 2234 **FOLLOWED BY** 0.9 %  sodium chloride infusion, 50 mL, Intravenous, Once, Kerney Elbe, MD .  0.9 %  sodium chloride infusion, , Intravenous, PRN, Amie Portland, MD, Last Rate: 10 mL/hr at 05/02/20 0600, Infusion Verify at 05/02/20 0600 .  acetaminophen (TYLENOL) tablet 650 mg, 650 mg, Oral, Q4H PRN **OR** acetaminophen (TYLENOL) 160 MG/5ML solution 650 mg, 650 mg, Per Tube, Q4H PRN, 650 mg at 05/02/20 1003 **OR** acetaminophen (TYLENOL) suppository 650 mg, 650 mg, Rectal, Q4H PRN, Kerney Elbe, MD, 650 mg at 05/01/20 1225 .  acyclovir (ZOVIRAX) 775 mg in dextrose 5 % 150 mL IVPB, 10 mg/kg (Adjusted), Intravenous, Q8H, von Dohlen, Haley B, RPH, Last Rate: 165.5 mL/hr at 05/02/20 0613, 775 mg at 05/02/20 0613 .  ampicillin (OMNIPEN) 2 g in sodium chloride 0.9 % 100 mL IVPB, 2 g, Intravenous, Q4H, Pashayan, Redgie Grayer, MD, Last Rate: 300 mL/hr at 05/02/20 0810, 2 g at 05/02/20 0810 .  cefTRIAXone (ROCEPHIN) 2 g in sodium chloride 0.9 % 100 mL IVPB, 2 g, Intravenous, Q12H, Pashayan, Redgie Grayer, MD, Last Rate: 200 mL/hr at 05/02/20 1020, 2 g at 05/02/20 1020 .  chlorhexidine gluconate (MEDLINE KIT) (PERIDEX) 0.12 % solution 15 mL, 15 mL, Mouth Rinse, BID, Simpson, Paula B, NP, 15 mL at 05/02/20 0815 .  Chlorhexidine Gluconate Cloth 2 % PADS 6 each, 6 each, Topical, Daily, Amie Portland, MD, 6 each at 05/01/20 1153 .  dexamethasone (DECADRON) injection 10 mg, 10 mg, Intravenous, Q6H, Amie Portland, MD, 10 mg at 05/02/20 5093 .  dexmedetomidine (PRECEDEX) 400 MCG/100ML (4 mcg/mL) infusion, 0.4-1.2 mcg/kg/hr, Intravenous, Titrated, Chand,  Sudham, MD, Last Rate: 31.6 mL/hr at 05/02/20 0806, 1.2 mcg/kg/hr at 05/02/20 0806 .  dolutegravir (TIVICAY) tablet 50 mg, 50 mg, Per Tube, Daily, 50 mg at 05/02/20 1003 **AND** emtricitabine-tenofovir AF (DESCOVY) 200-25 MG per tablet 1 tablet, 1 tablet, Per Tube, Daily, Jacky Kindle, MD, 1 tablet at 05/02/20 1003 .  feeding supplement (PROSource TF) liquid 45 mL, 45 mL, Per Tube, BID, Merlene Laughter F, NP, 45 mL at 05/02/20 1004 .  feeding supplement (VITAL HIGH PROTEIN) liquid 1,000 mL, 1,000 mL, Per Tube, Q24H, Merlene Laughter F, NP, 1,000 mL at 05/01/20 2000 .  fentaNYL (SUBLIMAZE) injection 50-100 mcg, 50-100 mcg, Intravenous, Q1H PRN, Merlene Laughter F, NP, 100 mcg at 05/02/20 1037 .  insulin aspart (novoLOG) injection 0-20 Units, 0-20 Units, Subcutaneous, Q4H, Chand, Sudham, MD .  labetalol (NORMODYNE) injection 10 mg, 10 mg, Intravenous, Q2H PRN, Merlene Laughter F, NP, 10 mg at 05/01/20 1424 .  lactated ringers infusion, , Intravenous, Continuous, Chand, Sudham, MD .  magnesium sulfate IVPB 2 g 50 mL, 2 g, Intravenous, Once, Chand, Currie Paris, MD, Last Rate: 50 mL/hr at 05/02/20 1024, 2 g at 05/02/20 1024 .  MEDLINE mouth rinse, 15 mL, Mouth Rinse, 10 times per day, Jennelle Human B, NP, 15 mL at 05/02/20 1027 .  pantoprazole sodium (PROTONIX) 40 mg/20 mL oral suspension 40 mg, 40 mg, Per Tube, Daily, Chand, Sudham, MD, 40 mg at 05/02/20 1004 .  rocuronium bromide 10 mg/mL (PF) syringe, 50 mg, Intravenous, Once, Chand, Currie Paris, MD .  senna-docusate (Senokot-S) tablet 1 tablet, 1 tablet, Per Tube, QHS PRN, Jacky Kindle, MD .  sodium chloride flush (NS) 0.9 % injection 3 mL, 3 mL, Intravenous, Once, Tyrone Nine, Dan, DO .  temazepam (RESTORIL) capsule 15 mg, 15 mg, Per Tube, QHS PRN, Jacky Kindle, MD .  vancomycin (VANCOREADY) IVPB 750 mg/150 mL, 750 mg, Intravenous, Q12H, von Dohlen, Stasia Cavalier, RPH, Stopped at 05/02/20  0420    ASSESSMENT/PLAN Ms. Gail Edwards is a 65 y.o. female with history of HIV  and prediabetes presenting to the ED as a Code Stroke. LKN was 7:15 PM, but she had had a worsening bifrontal nonthrobbing headache since 4 PM. While at church, her headache continued to worsen. After the service, she spoke with some people while going back to her car and she was noted to have altered speech. EMS was called and on arrival they noted intermittent garbled speech and left facial droop. Members of her congregation had noted LUE weakness, but EMS did not appreciate this. Her car was parked crooked per EMS, so they suspect that her symptoms may have been present at the time of her initial arrival to church, but per congregants she was at her normal baseline during the service. She initially had been speaking normally to congregants, with TOSO noted by them to be 7:15 PM. EMS noted her speech and facial droop to wax and wane in concert during initial assessment and transport. BP was 190/110 and CBG was 144. On arrival to the ED, she continued to having intermittent garbled speech, but comprehension was intact. Not on Aspirin, Plavix, or any anticoagulant.   Impression: Receptive aphasia-differentials include stroke-for which she received TPA but MRI negative for stroke. Differentials for current presentation and MRI appearance -likely small intraventricular hemorrhage versus CNS infection versus ventriculitis.  TIA vs Stroke status post IV TPA given her presentation  Code Stroke CT head No acute abnormality. ASPECTS 10.   CTA head & neck - No intracranial arterial occlusion or high-grade stenosis. Enlarged right thyroid lobe. Recommend thyroid ultrasound.  CT Head post tPA reading- Normal appearing brain parenchyma. No acute infarct evident. No mass or hemorrhage. There is a degree of empty sella, a stable finding of questionable significance. There are foci of paranasal sinus disease.  On second review of the CT head post TPA with the on-call neuroradiologist, question slight hyper density  in the left occipital horn of the lateral ventricle.   2D Echo - 55-60%, No thrombus, wall motion abnormality or shunt found.   LDL 82  HgbA1c 5.7  VTE prophylaxis - SCD's    Diet   Diet NPO time specified    No antithrombotic prior to admission, now on No antithrombotic. Given possible IVH will not start antithrombotic at this time.  No tPA reversal due to tPA given nearly 12h from the finding of concern for [facility]  Therapy recommendations:  Pending  Disposition:  Pending  Ventriculitis versus intraventricular hemorrhage post TPA  MRI - Fluid level within the occipital horn of the bilateral lateral ventricles with low signal on T2 and associated restricted diffusion. Findings are suggestive of small intraventricular hemorrhage. However, the possibility of ventriculitis CT be excluded. CSF sampling may be helpful for further evaluation.    LP planned for this evening.   Son reports 2 months ago pt had "very hard" sneeze with feeling of pop in her head. She then had liquid drip out of her nose for a few hours followed by progressively worsening  intermittent headaches-?  CSF leak and seeding from upper respiratory infection  HIV: Immune suppressed although she has been listed as a well-controlled, followed by Dr. Johnnye Sima. HIV-Emtricitabine-Rilpivir-Tenofovir   Empiric treatment for meningitis given her HIV status and inability to get a Spinal tap d/t s/p tPA  101.4 tmax  WBC 10.7->14.6  LP planned today  ID, Dr. Tommy Medal, consulted   Urine culture obtained 3/11  is PENDING  Blood culture obtained 3/11 is PENDING  Urine culture is PENDING Abx regimen:  Acyclovir IV 775 mg q8              Ampicillin 2 g q4              Ceftriaxone 2 g q12              Vancomycin 750 mg q12  Acute Hypoxic Respiratory Failure  Intubated 3/10  Appreciate CCM management   Hypertension  Home meds: None  Stable, 132G-401 systolic overnight  . Long-term BP goal  normotensive  Hyperlipidemia  Home meds:  none  LDL 82, goal < 70  Will not start a statin at this time due to possible IVH  Pre-Diabetes  Home meds:  None  HgbA1c 5.7, goal < 7.0  CBGs Recent Labs    05/02/20 0015 05/02/20 0342 05/02/20 0724  GLUCAP 228* 216* 200*      SSI  Other Stroke Risk Factors  Obesity, Body mass index is 37.47 kg/m., BMI >/= 30 associated with increased stroke risk, recommend weight loss, diet and exercise as appropriate   Other Active Problems -HIV - per ID   Thyromegaly with thyroid nodules: noted on [place] yesterday  Hospital day # 2  Attending addendum I have seen and examined the patient I have updated the examination above Her clinical presentation is more suspicious for a CNS infection rather than a stroke.  MRI of the brain did not show any acute stroke. The hyperintense signal from the ventricles could be blood or debris related to CNS infection. LP would be helpful-discussed with the son at bedside.  He consented for the procedure. Plan to do an LP bedside this afternoon. Has empiric coverage already. Appreciate PCCM assistance. We will consult ID due to continuing to spike fevers and history of HIV although documented to be well controlled. We will continue to follow.  -- Amie Portland, MD Stroke Neurology Pager: [phone]   CRITICAL CARE ATTESTATION Performed by: Amie Portland, MD Total critical care time: 50 minutes Critical care time was exclusive of separately billable procedures and treating other patients and/or supervising APPs/Residents/Students Critical care was necessary to treat or prevent imminent or life-threatening deterioration due to strokelike symptoms, status post IV thrombolysis, concern for ventriculitis/meningitis This patient is critically ill and at significant risk for neurological worsening and/or death and care requires constant monitoring. Critical care was time spent personally by me on the  following activities: development of treatment plan with patient and/or surrogate as well as nursing, discussions with consultants, evaluation of patient's response to treatment, examination of patient, obtaining history from patient or surrogate, ordering and performing treatments and interventions, ordering and review of laboratory studies, ordering and review of radiographic studies, pulse oximetry, re-evaluation of patient's condition, participation in multidisciplinary rounds and medical decision making of high complexity in the care of this patient.

## 2020-05-03 DIAGNOSIS — G0490 Encephalitis and encephalomyelitis, unspecified: Secondary | ICD-10-CM | POA: Diagnosis not present

## 2020-05-03 DIAGNOSIS — E871 Hypo-osmolality and hyponatremia: Secondary | ICD-10-CM

## 2020-05-03 DIAGNOSIS — J9601 Acute respiratory failure with hypoxia: Secondary | ICD-10-CM | POA: Diagnosis not present

## 2020-05-03 DIAGNOSIS — B20 Human immunodeficiency virus [HIV] disease: Secondary | ICD-10-CM

## 2020-05-03 DIAGNOSIS — J9600 Acute respiratory failure, unspecified whether with hypoxia or hypercapnia: Secondary | ICD-10-CM | POA: Diagnosis not present

## 2020-05-03 LAB — BASIC METABOLIC PANEL
Anion gap: 8 (ref 5–15)
BUN: 26 mg/dL — ABNORMAL HIGH (ref 8–23)
CO2: 20 mmol/L — ABNORMAL LOW (ref 22–32)
Calcium: 8.6 mg/dL — ABNORMAL LOW (ref 8.9–10.3)
Chloride: 111 mmol/L (ref 98–111)
Creatinine, Ser: 0.78 mg/dL (ref 0.44–1.00)
GFR, Estimated: >60 mL/min (ref >60)
Glucose, Bld: 220 mg/dL — ABNORMAL HIGH (ref 70–99)
Potassium: 3.6 mmol/L (ref 3.5–5.1)
Sodium: 139 mmol/L (ref 135–145)

## 2020-05-03 LAB — GLUCOSE, CAPILLARY
Glucose-Capillary: 208 mg/dL — ABNORMAL HIGH (ref 70–99)
Glucose-Capillary: 255 mg/dL — ABNORMAL HIGH (ref 70–99)
Glucose-Capillary: 212 mg/dL — ABNORMAL HIGH (ref 70–99)
Glucose-Capillary: 121 mg/dL — ABNORMAL HIGH (ref 70–99)
Glucose-Capillary: 149 mg/dL — ABNORMAL HIGH (ref 70–99)
Glucose-Capillary: 125 mg/dL — ABNORMAL HIGH (ref 70–99)

## 2020-05-03 LAB — RPR: RPR Ser Ql: NONREACTIVE

## 2020-05-03 LAB — HSV 1/2 PCR, CSF
HSV-1 DNA: NEGATIVE
HSV-2 DNA: NEGATIVE

## 2020-05-03 LAB — TOXOPLASMA GONDII ANTIBODY, IGG: Toxoplasma IgG Ratio: 11 IU/mL — ABNORMAL HIGH (ref 0.0–7.1)

## 2020-05-03 MED ORDER — POTASSIUM PHOSPHATES 15 MMOLE/5ML IV SOLN
30.0000 mmol | Freq: Once | INTRAVENOUS | Status: AC
  Administered 2020-05-03: 30 mmol via INTRAVENOUS
  Filled 2020-05-03: qty 10

## 2020-05-03 MED ORDER — WHITE PETROLATUM EX OINT
TOPICAL_OINTMENT | CUTANEOUS | Status: AC
  Filled 2020-05-03: qty 28.35

## 2020-05-03 MED ORDER — HYDRALAZINE HCL 20 MG/ML IJ SOLN
10.0000 mg | INTRAMUSCULAR | Status: DC | PRN
  Administered 2020-05-03 – 2020-05-05 (×5): 20 mg via INTRAVENOUS
  Filled 2020-05-03 (×5): qty 1

## 2020-05-03 MED ORDER — ENOXAPARIN SODIUM 40 MG/0.4ML ~~LOC~~ SOLN
40.0000 mg | Freq: Every day | SUBCUTANEOUS | Status: DC
  Administered 2020-05-03 – 2020-05-08 (×6): 40 mg via SUBCUTANEOUS
  Filled 2020-05-03 (×6): qty 0.4

## 2020-05-03 MED ORDER — TEMAZEPAM 15 MG PO CAPS: 15.0000 mg | ORAL_CAPSULE | Freq: Every evening | ORAL | Status: DC | PRN

## 2020-05-03 MED ORDER — DOLUTEGRAVIR SODIUM 50 MG PO TABS
50.0000 mg | ORAL_TABLET | Freq: Every day | ORAL | Status: DC
Start: 2020-05-04 — End: 2020-05-03

## 2020-05-03 MED ORDER — PANTOPRAZOLE SODIUM 40 MG PO PACK: 40.0000 mg | PACK | Freq: Every day | ORAL | Status: DC

## 2020-05-03 MED ORDER — EMTRICITAB-RILPIVIR-TENOFOV AF 200-25-25 MG PO TABS
1.0000 | ORAL_TABLET | Freq: Every day | ORAL | Status: DC
  Administered 2020-05-04 – 2020-05-08 (×5): 1 via ORAL
  Filled 2020-05-03 (×5): qty 1

## 2020-05-03 MED ORDER — EMTRICITABINE-TENOFOVIR AF 200-25 MG PO TABS: 1.0000 | ORAL_TABLET | Freq: Every day | ORAL | Status: DC

## 2020-05-03 MED ORDER — ADULT MULTIVITAMIN W/MINERALS CH
1.0000 | ORAL_TABLET | Freq: Every day | ORAL | Status: DC
  Administered 2020-05-04 – 2020-05-08 (×5): 1 via ORAL
  Filled 2020-05-03 (×5): qty 1

## 2020-05-03 MED ORDER — SENNOSIDES-DOCUSATE SODIUM 8.6-50 MG PO TABS: 1.0000 | ORAL_TABLET | Freq: Every evening | ORAL | Status: DC | PRN

## 2020-05-03 NOTE — Progress Notes (Signed)
eLink Physician-Brief Progress Note Patient Name: Gail Edwards DOB: Apr 21, 1955 MRN: [id number]   Date of Service  05/03/2020  HPI/Events of Note  Patient's blood pressure is lower than goal blood pressure of < 160 mmHg and PRN Labetalol which is ordered cannot be given due to heart rate in 50's.  eICU Interventions  PRN Hydralazine 10-20 mg iv Q 4 hours PRN SBP > 160 mmHg ordered.        Thomasene Lot Verba Ainley 05/03/2020, 5:58 AM

## 2020-05-03 NOTE — Progress Notes (Signed)
NEUROLOGY TEAM PROGRESS NOTE  Admission HPI  Gail Edwards is an 65 y.o. female with a PMHx of HIV and prediabetes presenting to the ED as a Code Stroke. LKN was 7:15 PM, but she had had a worsening bifrontal nonthrobbing headache since 4 PM. While at church, her headache continued to worsen. After the service, she spoke with some people while going back to her car and she was noted to have altered speech. EMS was called and on arrival they noted intermittent garbled speech and left facial droop. Members of her congregation had noted LUE weakness, but EMS did not appreciate this. Her car was parked crooked per EMS, so they suspect that her symptoms may have been present at the time of her initial arrival to church, but per congregants she was at her normal baseline during the service. She initially had been speaking normally to congregants, with TOSO noted by them to be 7:15 PM. EMS noted her speech and facial droop to wax and wane in concert during initial assessment and transport. BP was 190/110 and CBG was 144. On arrival to the ED, she continued to having intermittent garbled speech, but comprehension was intact.  She is not taking ASA, Plavix or an anticoagulant.   Major Hospital events: 3/10 increasing confusion, MRI with ventricular debris (blood versus pus); intubated 1336 d/t hypoxic respiratory failure.  Sedated on versed and precedex.  Febrile and started on broad-spectrum meningitis coverage (ampicillin, ceftriaxone, vancomycin, acyclovir)  INTERVAL HISTORY/ SUBJECTIVE Lumbar puncture completed,   Patient provides significant additional history about her headache.  She reports that several weeks ago she did sneeze and have a runny nose for a few hours but this resolved.  After that she did have a headache which would gradually worsen over the course of the day while she was up and about and resolved when she laid down.  At this time she no longer has a headache.   Vitals:   05/03/20 0600  05/03/20 0645 05/03/20 0700 05/03/20 0750  BP: (!) 168/66 (!) 138/58 (!) 142/57   Pulse: 62 82 85 86  Resp: (!) 21 (!) 22 (!) 21 (!) 26  Temp:      TempSrc:      SpO2: 99% 97% 97% 96%  Weight:      Height:       CBC:  Recent Labs  Lab 04/30/20 2106 04/30/20 2131 05/01/20 1418 05/02/20 0031  WBC 10.7*  --   --  14.6*  NEUTROABS 9.6*  --   --   --   HGB 13.0   < > 12.2 11.8*  HCT 38.5   < > 36.0 34.2*  MCV 89.5  --   --  87.7  PLT 336  --   --  238   < > = values in this interval not displayed.   Basic Metabolic Panel:  Recent Labs  Lab 05/02/20 0031 05/02/20 1637 05/03/20 0433  NA 134*  --  139  K 3.7  --  3.6  CL 106  --  111  CO2 19*  --  20*  GLUCOSE 237*  --  220*  BUN 13  --  26*  CREATININE 0.87  --  0.78  CALCIUM 8.6*  --  8.6*  MG 1.9 2.4  --   PHOS 2.7 1.9*  --    Lipid Panel:  Recent Labs  Lab 05/01/20 0509  CHOL 133  TRIG 27  HDL 46  CHOLHDL 2.9  VLDL 5  LDLCALC  82   HgbA1c:  Recent Labs  Lab 05/01/20 0504  HGBA1C 5.7*   Urine Drug Screen: No results for input(s): LABOPIA, COCAINSCRNUR, LABBENZ, AMPHETMU, THCU, LABBARB in the last 168 hours.  Alcohol Level No results for input(s): ETH in the last 168 hours.  Ammonia 33 3/11  MRSA screen PCR negative 3/11 Coronavirus negative 3/11  LP 3/11 opening pressure 30, hazy CSF, WBC 1780, RBC 117, glucose 79 (serum glucose 171), protein 197 Gram stain with white blood cells, no organisms, culture pending CD4 count reduced at 121, however HIV viral quant negative Cryptococcal antigen negative in the serum Blastomyces antigen pending, toxoplasma antibodies 11 (reference 0-7.1)  Acyclovir discontinued  IMAGING past 24 hours No results found.  PHYSICAL EXAM Blood pressure (!) 142/57, pulse 86, temperature 99.9 F (37.7 C), temperature source Axillary, resp. rate (!) 26, height 5' 6"  (1.676 m), weight 112.7 kg, SpO2 96 %.  General: alert and awake, obese caucasian female, conversant, able  to follow commands, able to name and repeat, able to provide significant additional history about her headache. Lungs: Symmetrical Chest rise, no labored breathing, doing well post extubation Cardio: Regular Rate and Rhythm Abdomen: Soft, non-tender  Neurological exam: Alert, awake, oriented to month and year, able to identify her son.  Able to name and repeat.  Able to follow simple commands.  Able to name the days of the week backwards correctly and quickly. Cranial nerves: Pupils equal round react light, visual fields full to confrontation other than some mild intermittent difficulty on the right side which she attributed to not wearing her glasses, face symmetric, tongue midline Motor exam: No pronator drift in the bilateral upper extremities.  5/5 strength throughout Sensory exam: Denies any numbness or tingling anywhere.  Intact to light touch throughout Coordination: Finger-nose and heel-to-shin intact bilaterally   Current Facility-Administered Medications:  .  [COMPLETED] alteplase (ACTIVASE) 1 mg/mL infusion 90 mg, 90 mg, Intravenous, Once, Stopped at 04/30/20 2234 **FOLLOWED BY** 0.9 %  sodium chloride infusion, 50 mL, Intravenous, Once, Kerney Elbe, MD .  0.9 %  sodium chloride infusion, , Intravenous, PRN, Amie Portland, MD, Last Rate: 10 mL/hr at 05/03/20 0600, Infusion Verify at 05/03/20 0600 .  acetaminophen (TYLENOL) tablet 650 mg, 650 mg, Oral, Q4H PRN **OR** acetaminophen (TYLENOL) 160 MG/5ML solution 650 mg, 650 mg, Per Tube, Q4H PRN, 650 mg at 05/02/20 1750 **OR** acetaminophen (TYLENOL) suppository 650 mg, 650 mg, Rectal, Q4H PRN, Kerney Elbe, MD, 650 mg at 05/01/20 1225 .  ampicillin (OMNIPEN) 2 g in sodium chloride 0.9 % 100 mL IVPB, 2 g, Intravenous, Q4H, Pashayan, Redgie Grayer, MD, Last Rate: 300 mL/hr at 05/03/20 0825, 2 g at 05/03/20 0825 .  cefTRIAXone (ROCEPHIN) 2 g in sodium chloride 0.9 % 100 mL IVPB, 2 g, Intravenous, Q12H, Pashayan, Redgie Grayer, MD, Stopped at  05/02/20 2233 .  chlorhexidine gluconate (MEDLINE KIT) (PERIDEX) 0.12 % solution 15 mL, 15 mL, Mouth Rinse, BID, Simpson, Paula B, NP, 15 mL at 05/03/20 0817 .  Chlorhexidine Gluconate Cloth 2 % PADS 6 each, 6 each, Topical, Daily, Amie Portland, MD, 6 each at 05/02/20 2100 .  dexamethasone (DECADRON) injection 10 mg, 10 mg, Intravenous, Q6H, Amie Portland, MD, 10 mg at 05/03/20 0553 .  dexmedetomidine (PRECEDEX) 400 MCG/100ML (4 mcg/mL) infusion, 0.4-1.2 mcg/kg/hr, Intravenous, Titrated, Chand, Sudham, MD, Last Rate: 15.8 mL/hr at 05/03/20 0618, 0.6 mcg/kg/hr at 05/03/20 0618 .  dolutegravir (TIVICAY) tablet 50 mg, 50 mg, Per Tube, Daily, 50 mg at 05/03/20 0818 **AND**  emtricitabine-tenofovir AF (DESCOVY) 200-25 MG per tablet 1 tablet, 1 tablet, Per Tube, Daily, Jacky Kindle, MD, 1 tablet at 05/03/20 0818 .  feeding supplement (PROSource TF) liquid 45 mL, 45 mL, Per Tube, TID, Jacky Kindle, MD, 45 mL at 05/02/20 2109 .  feeding supplement (VITAL AF 1.2 CAL) liquid 1,000 mL, 1,000 mL, Per Tube, Continuous, Chand, Sudham, MD, Last Rate: 55 mL/hr at 05/03/20 0600, Infusion Verify at 05/03/20 0600 .  hydrALAZINE (APRESOLINE) injection 10-20 mg, 10-20 mg, Intravenous, Q4H PRN, Jacky Kindle, MD, 20 mg at 05/03/20 0610 .  insulin aspart (novoLOG) injection 0-20 Units, 0-20 Units, Subcutaneous, Q4H, Chand, Currie Paris, MD, 11 Units at 05/03/20 0812 .  labetalol (NORMODYNE) injection 10 mg, 10 mg, Intravenous, Q2H PRN, Jacky Kindle, MD, 10 mg at 05/01/20 1424 .  MEDLINE mouth rinse, 15 mL, Mouth Rinse, 10 times per day, Jennelle Human B, NP, 15 mL at 05/03/20 0815 .  multivitamin with minerals tablet 1 tablet, 1 tablet, Per Tube, Daily, Jacky Kindle, MD, 1 tablet at 05/02/20 1750 .  pantoprazole sodium (PROTONIX) 40 mg/20 mL oral suspension 40 mg, 40 mg, Per Tube, Daily, Chand, Sudham, MD, 40 mg at 05/02/20 1004 .  rocuronium bromide 10 mg/mL (PF) syringe, 50 mg, Intravenous, Once, Chand, Sudham, MD .   senna-docusate (Senokot-S) tablet 1 tablet, 1 tablet, Per Tube, QHS PRN, Jacky Kindle, MD .  sodium chloride flush (NS) 0.9 % injection 3 mL, 3 mL, Intravenous, Once, Tyrone Nine, Dan, DO .  temazepam (RESTORIL) capsule 15 mg, 15 mg, Per Tube, QHS PRN, Jacky Kindle, MD, 15 mg at 05/02/20 2103 .  vancomycin (VANCOREADY) IVPB 750 mg/150 mL, 750 mg, Intravenous, Q12H, von Dohlen, Haley B, RPH, Last Rate: 150 mL/hr at 05/03/20 0115, 750 mg at 05/03/20 0115    ASSESSMENT/PLAN Ms. Gail Edwards is a 65 y.o. female with history of HIV and prediabetes presenting to the ED as a Code Stroke. LKN was 7:15 PM, but she had had a worsening bifrontal nonthrobbing headache since 4 PM. While at church, her headache continued to worsen. After the service, she spoke with some people while going back to her car and she was noted to have altered speech. EMS was called and on arrival they noted intermittent garbled speech and left facial droop. Members of her congregation had noted LUE weakness, but EMS did not appreciate this. Her car was parked crooked per EMS, so they suspect that her symptoms may have been present at the time of her initial arrival to church, but per congregants she was at her normal baseline during the service. She initially had been speaking normally to congregants, with TOSO noted by them to be 7:15 PM. EMS noted her speech and facial droop to wax and wane in concert during initial assessment and transport. BP was 190/110 and CBG was 144. On arrival to the ED, she continued to having intermittent garbled speech, but comprehension was intact. Not on Aspirin, Plavix, or any anticoagulant.   She was appropriately initially treated with tPA given concern for acute left MCA branch occlusion based on examination.  Given her subsequent clinical course with fever and worsening mental status and imaging concerning for ventriculitis, lumbar puncture was performed with studies consistent with a bacterial meningitis as  detailed above.  Suspect she may have had a brief CSF leak a few weeks prior to her presentation leading to seeding of her CSF based on the headache history she provides as documented above.   Acute bacterial meningitis, improving  Defer antibiotic course and management to infectious disease team, anticipate it may be hard to isolate an organism given that she did receive antibiotics for some time prior to lumbar puncture as it could not be safely performed until 24 hours after TPA administration  At this time she is not having ongoing symptoms of CSF leak.  This may have been a transient phenomena which has spontaneously sealed should she again develop low pressure headache symptoms in the future, CT myelography would be indicated to identify source of the leak so this could be treated  She will need outpatient neurology follow-up to confirm she is not again developing a low pressure headache after discharge  Neurology will sign off at this time, but should she develop recurrent headache please reach out to [place] again  Hypertension  Management per primary team . Long-term BP goal normotensive  Hyperlipidemia  Home meds:  none  LDL 82, given she has not had a stroke, she does not need intensive management at this time   Pre-Diabetes  Home meds:  None  HgbA1c 5.7, goal < 7.0  Management per primary team  Other Stroke Risk Factors  Obesity, Body mass index is 40.1 kg/m., BMI >/= 30 associated with increased stroke risk, recommend weight loss, diet and exercise as appropriate   Other Active Problems -HIV - per ID   Thyromegaly with thyroid nodules: noted on [place] yesterday, outpatient follow-up with  Hospital day # 3  -- Lesleigh Noe MD-PhD [facility] [phone]     CRITICAL CARE ATTESTATION Performed by: Lesleigh Noe MD-PhD Total critical care time: 42 minutes Critical care time was exclusive of separately billable procedures and treating other  patients and/or supervising APPs/Residents/Students Critical care was necessary to treat or prevent imminent or life-threatening deterioration due to strokelike symptoms, status post IV thrombolysis, concern for ventriculitis/meningitis This patient is critically ill and at significant risk for neurological worsening and/or death and care requires constant monitoring. Critical care was time spent personally by me on the following activities: development of treatment plan with patient and/or surrogate as well as nursing, discussions with consultants, evaluation of patient's response to treatment, examination of patient, obtaining history from patient or surrogate, ordering and performing treatments and interventions, ordering and review of laboratory studies, ordering and review of radiographic studies, pulse oximetry, re-evaluation of patient's condition, participation in multidisciplinary rounds and medical decision making of high complexity in the care of this patient.

## 2020-05-03 NOTE — Progress Notes (Signed)
Pt with SBP goal <160, however SBP's trending mid 160's to low 170's despite adequate sedation and analgesia. HR 50, unable to give PRN labetalol. eLink provider notified and RN requested for additional PRN antiHTN med.

## 2020-05-03 NOTE — Procedures (Signed)
Extubation Procedure Note  Patient Details:   Name: Gail Edwards DOB: Jan 07, 1956 MRN: [id number]   Airway Documentation:    Vent end date: 05/03/20 Vent end time: 1041   Evaluation  O2 sats: stable throughout Complications: No apparent complications Patient did tolerate procedure well. Bilateral Breath Sounds: Clear,Diminished   Yes,  Prior to extubation, pt did have a positive cuff leak. Pt was extubated to [facility]. Pt was able to state her name and has strong productive cough after extubation. Pt tolerated well with SVS. No stridor noted. RN at bedside. RT will continue to monitor pt.  Megan Mans 05/03/2020, 10:42 AM

## 2020-05-03 NOTE — Progress Notes (Signed)
NAME:  Gail Edwards, MRN:  [id number], DOB:  June 19, 1955, LOS: 3 ADMISSION DATE:  04/30/2020, CONSULTATION DATE:  05/01/2020 REFERRING MD:  Dr. Wilford Corner, CHIEF COMPLAINT:  AMS  Brief History:  82 yoF with HIV presented 3/9 as code stroke with garbled speech and left facial droop.  CTH neg.  Given tPA.  On 3/10, developed worsening confusion.  MR brain concerning for small IVH vs possible ventriculitis.  Abx added.  PCCM consulted for possible intubation given progressive decline in mental status.   Past Medical History:  HIV, prediabetic   Significant Hospital Events:  3/9 Admitted Stroke service s/p tPA 3/10 worsening confusion, MRI, PCCM consult, intubated  Consults:  PCCM  Procedures:  3/10 ETT >> 3/11 lumbar puncture  Significant Diagnostic Tests:  3/9 CTH >> 1. Normal head CT. 2. ASPECTS is 10..  3/10 MR brain >> Fluid level within the occipital horn of the bilateral lateral ventricles with low signal on T2 and associated restricted diffusion. Findings are suggestive of small intraventricular hemorrhage. However, the possibility of ventriculitis CT be excluded. CSF sampling may be helpful for further evaluation.  Micro Data:  3/9 SARS / flu >> neg 3/10 MRSA >> neg  Antimicrobials:  3/10 acyclovir >> 3/11 3/10 ampicillin >> 3/10 vancomycin >> 3/10 ceftriaxone (2gm q 12hr) >>  Interim History / Subjective:  LP was performed yesterday, it was sent for analysis, studies are consistent with a bacterial meningitis.  Acyclovir stopped Patient has been tolerating pressure support trial since morning   Objective   Blood pressure (!) 142/57, pulse 86, temperature 99.9 F (37.7 C), temperature source Axillary, resp. rate (!) 26, height 5\' 6"  (1.676 m), weight 112.7 kg, SpO2 96 %.    Vent Mode: PSV;CPAP FiO2 (%):  [40 %] 40 % Set Rate:  [18 bmp-20 bmp] 20 bmp Vt Set:  [470 mL] 470 mL PEEP:  [5 cmH20] 5 cmH20 Pressure Support:  [5 cmH20] 5 cmH20 Plateau Pressure:  [15  cmH20-17 cmH20] 15 cmH20   Intake/Output Summary (Last 24 hours) at 05/03/2020 07/03/2020 Last data filed at 05/03/2020 0600 Gross per 24 hour  Intake 4440.81 ml  Output 1200 ml  Net 3240.81 ml   Filed Weights   05/01/20 1200 05/03/20 0300  Weight: 105.3 kg 112.7 kg    Examination: General: Acutely ill appearing elderly female, lying in bed, orally intubated HEENT: [place]/AT,  ETT at 25 at the teeth, MM pink/moist, PERRL, Neuro: Opens eyes with vocal stimuli, following simple commands, moving all 4 extremities spontaneously CV: s1s2 regular rate and rhythm, no murmur, rubs, or gallops,  PULM:  Clear to ascultation, tachypnea, no wheezes or rhonchi GI: soft, bowel sounds active in all 4 quadrants, non-tender, non-distended Extremities: warm/dry, no edema  Skin: no rashes or lesions  Resolved Hospital Problem list     Assessment & Plan:  Acute infectious encephalopathy due to acute meningitis/ventriculitis LP was performed yesterday, consistent with acute bacterial meningitis Acyclovir stopped Continue ampicillin, ceftriaxone, vanc Continue steroid for bacterial meningitis to complete 4 days therapy Continue neuro check every 1 hour Nutrition and bowel regiment  Seizure precautions  Aspirations precautions   Acute respiratory failure with hypoxia Patient has been tolerating pressure support trial, watch for respiratory distress Continue ventilator support with lung protective strategies  Head of bed elevated 30 degrees. Plateau pressures less than 30 cm H20.  Follow intermittent chest x-ray and ABG.   SAT/SBT as tolerated, will try to extubate her today Ensure adequate pulmonary hygiene  Follow cultures  VAP bundle in place  PAD protocol  Hyponatremia Improved, serum sodium is 139 today  HIV - followed by Dr. Ninetta Lights continue home Emtricitabine-Rilpivir-Tenofovir   Morbid obesity Nutritionist follow-up   Best practice (evaluated daily)  Diet: Tube  feeds Pain/Anxiety/Delirium protocol (if indicated): precedex/ prn fentanyl VAP protocol (if indicated): yes DVT prophylaxis: Subcu Lovenox GI prophylaxis: PPI Glucose control: CBG q 4 Mobility: As tolerated Disposition:Neuro ICU  Goals of Care:   Code Status: full   Labs   CBC: Recent Labs  Lab 04/30/20 2106 04/30/20 2131 05/01/20 0905 05/01/20 1418 05/02/20 0031  WBC 10.7*  --   --   --  14.6*  NEUTROABS 9.6*  --   --   --   --   HGB 13.0 13.6 13.3 12.2 11.8*  HCT 38.5 40.0 39.0 36.0 34.2*  MCV 89.5  --   --   --  87.7  PLT 336  --   --   --  238    Basic Metabolic Panel: Recent Labs  Lab 04/30/20 2106 04/30/20 2131 05/01/20 0905 05/01/20 1405 05/01/20 1418 05/01/20 1700 05/02/20 0031 05/02/20 1637 05/03/20 0433  NA 135 136 137  --  135  --  134*  --  139  K 4.1 4.1 3.6  --  3.2*  --  3.7  --  3.6  CL 105 104  --   --   --   --  106  --  111  CO2 20*  --   --   --   --   --  19*  --  20*  GLUCOSE 107* 106*  --   --   --   --  237*  --  220*  BUN 14 16  --   --   --   --  13  --  26*  CREATININE 0.88 0.80  --   --   --   --  0.87  --  0.78  CALCIUM 9.1  --   --   --   --   --  8.6*  --  8.6*  MG  --   --   --  1.8  --  1.8 1.9 2.4  --   PHOS  --   --   --  1.9*  --  2.8 2.7 1.9*  --    GFR: Estimated Creatinine Clearance: 91.7 mL/min (by C-G formula based on SCr of 0.78 mg/dL). Recent Labs  Lab 04/30/20 2106 05/02/20 0031  WBC 10.7* 14.6*    Liver Function Tests: Recent Labs  Lab 04/30/20 2106 05/02/20 0031  AST 20 14*  ALT 17 15  ALKPHOS 89 70  BILITOT 1.1 0.6  PROT 6.7 6.2*  ALBUMIN 3.9 3.1*   No results for input(s): LIPASE, AMYLASE in the last 168 hours. Recent Labs  Lab 05/01/20 1028  AMMONIA 33    ABG    Component Value Date/Time   PHART 7.360 05/01/2020 1418   PCO2ART 36.0 05/01/2020 1418   PO2ART 136 (H) 05/01/2020 1418   HCO3 20.3 05/01/2020 1418   TCO2 21 (L) 05/01/2020 1418   ACIDBASEDEF 5.0 (H) 05/01/2020 1418    O2SAT 99.0 05/01/2020 1418     Coagulation Profile: Recent Labs  Lab 04/30/20 2106 05/02/20 0752  INR 1.1 1.3*    Cardiac Enzymes: No results for input(s): CKTOTAL, CKMB, CKMBINDEX, TROPONINI in the last 168 hours.  HbA1C: Hgb A1c MFr Bld  Date/Time Value Ref Range Status  05/01/2020 05:04 AM 5.7 (  H) 4.8 - 5.6 % Final    Comment:    (NOTE) Pre diabetes:          5.7%-6.4%  Diabetes:              >6.4%  Glycemic control for   <7.0% adults with diabetes   11/20/2007 09:36 AM 5.2 %     CBG: Recent Labs  Lab 05/02/20 1607 05/02/20 2001 05/02/20 2336 05/03/20 0341 05/03/20 0727  GLUCAP 238* 225* 172* 208* 255*    Past Medical History:  She,  has a past medical history of Abnormal Pap smear (2009), HIV infection (HCC), and Pre-diabetes.   Surgical History:   Past Surgical History:  Procedure Laterality Date  . CESAREAN SECTION    . CHOLECYSTECTOMY    . HERNIA REPAIR    . ORIF HUMERUS FRACTURE Right 12/17/2016   Procedure: OPEN REDUCTION INTERNAL FIXATION (ORIF) RIGHT PROXIMAL HUMERUS FRACTURE;  Surgeon: Sheral Apley, MD;  Location: [facility];  Service: Orthopedics;  Laterality: Right;     Social History:   reports that she has never smoked. She has never used smokeless tobacco. She reports that she does not drink alcohol and does not use drugs.   Family History:  Her family history includes Cancer in her father; Diabetes in her mother. There is no history of Breast cancer.   Allergies Allergies  Allergen Reactions  . Codeine Other (See Comments)    Headache     Home Medications  Prior to Admission medications   Medication Sig Start Date End Date Taking? Authorizing Provider  Cholecalciferol (VITAMIN D3) 10 MCG (400 UNIT) tablet Take 400 Units by mouth daily.   Yes [provider]  clobetasol ointment (TEMOVATE) 0.05 % APPLY 1 APPLICATION  TOPICALLY 2  TIMES DAILY 01/29/20  Yes Ginnie Smart, MD   emtricitabine-rilpivir-tenofovir AF (ODEFSEY) 200-25-25 MG TABS tablet Take 1 tablet by mouth daily with breakfast. 01/22/20  Yes Ginnie Smart, MD  temazepam (RESTORIL) 15 MG capsule TAKE 1 CAPSULE BY MOUTH  EVERY NIGHT AT BEDTIME AS  NEEDED FOR SLEEP 04/02/20  Yes Ginnie Smart, MD     Critical care time:    Total critical care time: 36 minutes  Performed by: Cheri Fowler   Critical care time was exclusive of separately billable procedures and treating other patients.   Critical care was necessary to treat or prevent imminent or life-threatening deterioration.   Critical care was time spent personally by me on the following activities: development of treatment plan with patient and/or surrogate as well as nursing, discussions with consultants, evaluation of patient's response to treatment, examination of patient, obtaining history from patient or surrogate, ordering and performing treatments and interventions, ordering and review of laboratory studies, ordering and review of radiographic studies, pulse oximetry and re-evaluation of patient's condition.   Cheri Fowler MD [facility] See Amion for pager If no response to pager, please call [phone] until 7pm After 7pm, Please call E-link [phone]

## 2020-05-03 NOTE — Progress Notes (Signed)
SLP Cancellation Note  Patient Details Name: KONNOR JORDEN MRN: [id number] DOB: 1956-01-02   Cancelled treatment:       Reason Eval/Treat Not Completed: Patient not medically ready. Intubated. Will sign off at this time and await new orders    Jaquala Fuller, Riley Nearing 05/03/2020, 9:22 AM

## 2020-05-03 NOTE — Consult Note (Signed)
Physical Medicine and Rehabilitation Consult Reason for Consult: Left facial droop with slurred speech Referring Physician: Critical care   HPI: Gail Edwards is a 65 y.o. right-handed female with history of HIV but long-term virologic/immunologic control and prediabetes.  Per chart review patient lives alone.  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  Two-level home with full bath on main level and bedroom upstairs.  She has a son who lives in the area.  Presented 05/01/2020 with acute onset of aphasia as well as left facial droop and headache.  Patient with recent fever and chills.  Her blood pressure was 190/110.  Cranial CT scan negative.  Patient did  receive TPA.  CT angiogram of head and neck no intracranial arterial occlusion or high-grade stenosis.  MRI showed fluid level within the occipital horn of the bilateral lateral ventricles with low signal on T2 and associated restricted diffusion.  Findings suggestive of small intraventricular hemorrhage with the possibility of ventriculitis.  Echocardiogram with ejection fraction of 55 to 60% no wall motion abnormalities.  Admission chemistries unremarkable except glucose 107, WBC 10,700, SARS coronavirus negative, hemoglobin A1c 5.7, ammonia level within normal limits 33, urinalysis positive nitrite.  Patient did require intubation for airway protection and extubated 05/03/2020.  Neurology follow-up as well as infectious disease and LP was performed consistent with acute bacterial meningitis and currently maintained on Emtricitabine-Rilpivir-Tenofovir as well as broad-spectrum antibiotics of ampicillin and vancomycin.. Droplet precautions.  Patient was cleared to begin Lovenox for DVT prophylaxis.  Therapy evaluations completed due to patient's decreased functional mobility physical medicine rehab consult requested.   Review of Systems  Constitutional: Positive for chills, fever and malaise/fatigue.  HENT: Negative  for hearing loss.   Eyes: Negative for blurred vision and double vision.  Respiratory: Negative for cough and shortness of breath.   Cardiovascular: Positive for leg swelling. Negative for chest pain and palpitations.  Gastrointestinal: Positive for constipation. Negative for heartburn, nausea and vomiting.  Genitourinary: Negative for dysuria, flank pain and hematuria.  Musculoskeletal: Positive for joint pain and myalgias.  Skin: Negative for rash.  Neurological: Positive for dizziness, speech change, weakness and headaches.  All other systems reviewed and are negative.  Past Medical History:  Diagnosis Date  . Abnormal Pap smear 2009   [id number]  . HIV infection (HCC)   . Pre-diabetes    Past Surgical History:  Procedure Laterality Date  . CESAREAN SECTION    . CHOLECYSTECTOMY    . HERNIA REPAIR    . ORIF HUMERUS FRACTURE Right 12/17/2016   Procedure: OPEN REDUCTION INTERNAL FIXATION (ORIF) RIGHT PROXIMAL HUMERUS FRACTURE;  Surgeon: Sheral Apley, MD;  Location: [facility];  Service: Orthopedics;  Laterality: Right;   Family History  Problem Relation Age of Onset  . Diabetes Mother   . Cancer Father        brain  . Breast cancer Neg Hx    Social History:  reports that she has never smoked. She has never used smokeless tobacco. She reports that she does not drink alcohol and does not use drugs. Allergies:  Allergies  Allergen Reactions  . Codeine Other (See Comments)    Headache   Medications Prior to Admission  Medication Sig Dispense Refill  . Cholecalciferol (VITAMIN D3) 10 MCG (400 UNIT) tablet Take 400 Units by mouth daily.    . clobetasol ointment (TEMOVATE) 0.05 % APPLY 1 APPLICATION  TOPICALLY 2  TIMES DAILY 90 g 5  .  emtricitabine-rilpivir-tenofovir AF (ODEFSEY) 200-25-25 MG TABS tablet Take 1 tablet by mouth daily with breakfast. 90 tablet 5  . temazepam (RESTORIL) 15 MG capsule TAKE 1 CAPSULE BY MOUTH  EVERY NIGHT AT BEDTIME AS  NEEDED FOR  SLEEP 90 capsule 0    Home: Home Living Family/patient expects to be discharged to:: Private residence Living Arrangements: Alone Available Help at Discharge: Family,Available PRN/intermittently Type of Home: House Home Access: Stairs to enter [employer] of Steps: 2 Entrance Stairs-Rails: None Home Layout: Two level,Full bath on main level Alternate Level Stairs-Number of Steps: 10 Alternate Level Stairs-Rails: Right Bathroom Shower/Tub: Health visitor: Handicapped height Bathroom Accessibility: Yes Home Equipment: Environmental consultant - 2 wheels,Adaptive equipment,Shower Engineering geologist: Educational psychologist Comments: son lives in [place] ( [place] goes by [name])  Functional History: Prior Function Level of Independence: Independent Comments: 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 Increased time for reciprocal scooting Transfers Overall transfer level: Needs assistance Equipment used: Rolling walker (2 wheeled) Transfers: Sit to/from [employer] Sit to Stand: +2 physical assistance,Mod assist Stand pivot transfers: +2 physical assistance,Max assist General transfer comment: MinA to rise to stand, cues for hand placement Ambulation/Gait Ambulation/Gait assistance: Min assist Gait Distance (Feet): 10 Feet Assistive device: Rolling walker (2 wheeled) Gait Pattern/deviations: Step-through pattern,Decreased stride length General Gait Details: Cues for walker use, sequencing, slow and effortful pace. No overt LOB Gait velocity: decreased Gait velocity interpretation: <1.31 ft/sec, indicative of household ambulator    ADL: ADL Overall ADL's : Needs  assistance/impaired Eating/Feeding: NPO Eating/Feeding Details (indicate cue type and reason): notified SLP of extubation, notified RN of cancelled SLP consult need for new orders if appropriate Grooming: Wash/dry face,Bed level,Supervision/safety Grooming Details (indicate cue type and reason): supine washing mouth Upper Body Bathing: Moderate assistance Lower Body Bathing: Maximal assistance Upper Body Dressing : Moderate assistance Lower Body Dressing: Maximal assistance Toilet Transfer: +2 for physical assistance,Maximal assistance,BSC Toilet Transfer Details (indicate cue type and reason): simulated bil LE extended, fixated gaze, posterior lean, strong grasp to therapists Toileting - Clothing Manipulation Details (indicate cue type and reason): total (A) supine for peri care after incontience of stool and lack of awareness General ADL Comments: incontinence of stool and lack of awareness  Cognition: Cognition Overall Cognitive Status: Impaired/Different from baseline Orientation Level: Oriented X4 Cognition Arousal/Alertness: Awake/alert Behavior During Therapy: WFL for tasks assessed/performed Overall Cognitive Status: Impaired/Different from baseline Area of Impairment: Problem solving Orientation Level: Disoriented to,Time Problem Solving: Slow processing,Difficulty sequencing General Comments: Mildly slower processing times, HOH which is new  Blood pressure (!) 144/76, pulse 73, temperature 97.6 F (36.4 C), temperature source Oral, resp. rate (!) 24, height 5\' 6"  (1.676 m), weight 112.7 kg, SpO2 97 %. Physical Exam Constitutional:      General: She is not in acute distress.    Appearance: She is obese. She is not ill-appearing.  HENT:     Head: Normocephalic and atraumatic.     Right Ear: External ear normal.     Left Ear: External ear normal.     Nose: Nose normal.     Mouth/Throat:     Mouth: Mucous membranes are moist.     Pharynx: Oropharynx is clear.  Eyes:  Extraocular Movements: Extraocular movements intact.     Conjunctiva/sclera: Conjunctivae normal.     Pupils: Pupils are equal, round, and reactive to light.  Cardiovascular:     Rate and Rhythm: Normal rate and regular rhythm.     Pulses: Normal pulses.     Heart sounds: No murmur heard. No gallop.   Pulmonary:     Effort: Pulmonary effort is normal.  Abdominal:     Palpations: Abdomen is soft.  Musculoskeletal:        General: Swelling present.     Cervical back: Normal range of motion.  Skin:    General: Skin is warm.  Neurological:     Comments: Patient is alert no acute distress.  Made eye contact with examiner.  Provides her name and age.  Follows simple commands. Sl decreased insight and awareness. She did have some initial difficulty recalling her full hospital course. Able to recall biographical information. Oriented to person, place, date (using calendar) and why she's here. RUE 5/5. LUE 4/5. RLE 4/5 prox to 4+ distally. LLE: 3+ to 4-/5 prox to 4/5 distally. Sensed pain and light touch in all 4's. Normal tone  Psychiatric:        Mood and Affect: Mood normal.     Comments: Very pleasant and cooperative     Results for orders placed or performed during the hospital encounter of 04/30/20 (from the past 24 hour(s))  Vancomycin, trough     Status: Abnormal   Collection Time: 05/04/20  2:09 PM  Result Value Ref Range   Vancomycin Tr 13 (L) 15 - 20 ug/mL  Glucose, capillary     Status: Abnormal   Collection Time: 05/04/20  3:32 PM  Result Value Ref Range   Glucose-Capillary 152 (H) 70 - 99 mg/dL  Glucose, capillary     Status: Abnormal   Collection Time: 05/04/20  8:04 PM  Result Value Ref Range   Glucose-Capillary 179 (H) 70 - 99 mg/dL  Glucose, capillary     Status: Abnormal   Collection Time: 05/04/20 11:39 PM  Result Value Ref Range   Glucose-Capillary 152 (H) 70 - 99 mg/dL  CBC     Status: Abnormal   Collection Time: 05/05/20 12:55 AM  Result Value Ref Range    WBC 8.4 4.0 - 10.5 K/uL   RBC 3.93 3.87 - 5.11 MIL/uL   Hemoglobin 11.6 (L) 12.0 - 15.0 g/dL   HCT 40.934.6 (L) 81.136.0 - 91.446.0 %   MCV 88.0 80.0 - 100.0 fL   MCH 29.5 26.0 - 34.0 pg   MCHC 33.5 30.0 - 36.0 g/dL   RDW 78.215.0 95.611.5 - 21.315.5 %   Platelets 310 150 - 400 K/uL   nRBC 0.0 0.0 - 0.2 %  Glucose, capillary     Status: Abnormal   Collection Time: 05/05/20  3:23 AM  Result Value Ref Range   Glucose-Capillary 147 (H) 70 - 99 mg/dL  Glucose, capillary     Status: Abnormal   Collection Time: 05/05/20  7:50 AM  Result Value Ref Range   Glucose-Capillary 144 (H) 70 - 99 mg/dL  Glucose, capillary     Status: Abnormal   Collection Time: 05/05/20 11:00 AM  Result Value Ref Range   Glucose-Capillary 156 (H) 70 - 99 mg/dL   No results found.   Assessment/Plan: Diagnosis: functional and cognitive deficits d/t bacterial meningitis 1. Does the need for close, 24 hr/day medical supervision in concert with the patient's rehab needs make it unreasonable for this  patient to be served in a less intensive setting? Yes 2. Co-Morbidities requiring supervision/potential complications: HIV, antibiotic mgt 3. Due to bladder management, bowel management, safety, skin/wound care, disease management, medication administration, pain management and patient education, does the patient require 24 hr/day rehab nursing? Yes 4. Does the patient require coordinated care of a physician, rehab nurse, therapy disciplines of PT, OT, SLP to address physical and functional deficits in the context of the above medical diagnosis(es)? Yes Addressing deficits in the following areas: balance, endurance, locomotion, strength, transferring, bowel/bladder control, bathing, dressing, feeding, grooming, toileting, cognition and psychosocial support 5. Can the patient actively participate in an intensive therapy program of at least 3 hrs of therapy per day at least 5 days per week? Yes 6. The potential for patient to make measurable gains  while on inpatient rehab is excellent 7. Anticipated functional outcomes upon discharge from inpatient rehab are supervision  with PT, supervision and min assist with OT, modified independent and supervision with SLP. 8. Estimated rehab length of stay to reach the above functional goals is: 12-18 days 9. Anticipated discharge destination: Home 10. Overall Rehab/Functional Prognosis: excellent  RECOMMENDATIONS: This patient's condition is appropriate for continued rehabilitative care in the following setting: CIR Patient has agreed to participate in recommended program. Yes Note that insurance prior authorization may be required for reimbursement for recommended care.  Comment: Pt tells me her brother will be staying with her upon discharge to home.  Rehab Admissions Coordinator to follow up.  Thanks,  Ranelle Oyster, MD, [place] [facility]  I have personally performed a face to face diagnostic evaluation of this patient. Additionally, I have examined pertinent labs and radiographic images. I have reviewed and concur with the physician assistant's documentation above.    Ranelle Oyster, MD 05/05/2020

## 2020-05-03 NOTE — Evaluation (Signed)
Physical Therapy Evaluation Patient Details Name: Gail Edwards MRN: [id number] DOB: 01-07-1956 Today's Date: 05/03/2020   History of Present Illness  65 yo female admitted garbled speech and L facial droop. TPA given on 3/10 worsening symptoms MR (+) small IVH vs ventriculitis intubation decline in mental status 3/11lumbar punture reveals bacterial mengitis PMH HIV prediabetic ORIF humerus R 2018  Clinical Impression  Prior to admission, pt lives alone and works at [employer] in [place]. Pt presents with cognitive deficits, balance deficits, weakness. Requiring two person maximal assist for stand pivot transfers. Pt with brief period of decreased responsiveness when mobilizing; afterwards reporting due to "dizziness and headache." BP pre mobility 146/64, post mobility 181/83. Suspect pt will progress well based on age, PLOF and motivation. Recommending CIR to address deficits and maximize functional independence.     Follow Up Recommendations CIR    Equipment Recommendations  3in1 (PT)    Recommendations for Other Services       Precautions / Restrictions Precautions Precautions: Fall Restrictions Weight Bearing Restrictions: No      Mobility  Bed Mobility Overal bed mobility: Needs Assistance Bed Mobility: Supine to Sit;Rolling;Sit to Supine Rolling: Mod assist   Supine to sit: Mod assist     General bed mobility comments: pt requires (A) to initiate. pt initiates rolling but when given cues to sit up delayed response to command. Pt needs additional cues again to initiate. pt static sitting min (A)    Transfers Overall transfer level: Needs assistance Equipment used: 2 person hand held assist Transfers: Sit to/from [employer] Sit to Stand: +2 physical assistance;Mod assist Stand pivot transfers: +2 physical assistance;Max assist       General transfer comment: pt with posterior lean and extension with actual transfer. Therapist swinging patient hips toward  chair. pt reports dizzines and HA with transfer.  Ambulation/Gait                Stairs            Wheelchair Mobility    Modified Rankin (Stroke Patients Only)       Balance Overall balance assessment: Needs assistance Sitting-balance support: Bilateral upper extremity supported;Feet supported Sitting balance-Leahy Scale: Poor     Standing balance support: Bilateral upper extremity supported;During functional activity Standing balance-Leahy Scale: Poor                               Pertinent Vitals/Pain Pain Assessment: Faces Faces Pain Scale: Hurts little more Pain Location: headache Pain Descriptors / Indicators: Headache Pain Intervention(s): Monitored during session    Home Living Family/patient expects to be discharged to:: Private residence Living Arrangements: Alone Available Help at Discharge: Family;Available PRN/intermittently Type of Home: House Home Access: Stairs to enter Entrance Stairs-Rails: None Entrance Stairs-Number of Steps: 2 Home Layout: Two level;Full bath on main level;Bed/bath upstairs Home Equipment: Walker - 2 wheels;Adaptive equipment;Shower seat Additional Comments: son lives in [place] ( [name] goes by [name])    Prior Function Level of Independence: Independent         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 pt noted to have a large lump of tissue on the R side of neck compared to L side of neck. pt reports R has been larger than L prior to admission RN in room and aware  Communication    Communication: No difficulties  Cognition Arousal/Alertness: Awake/alert Behavior During Therapy: WFL for tasks assessed/performed Overall Cognitive Status: Impaired/Different from baseline Area of Impairment: Orientation                 Orientation Level: Disoriented to;Time             General Comments: reports it is March 2020 with cues able to state 2022. pt able to recall after . pt very slow to respond at times. pt looking at therapist but not hearing therapist. Pt reports hearing changes      General Comments      Exercises     Assessment/Plan    PT Assessment Patient needs continued PT services  PT Problem List Decreased strength;Decreased activity tolerance;Decreased mobility;Decreased balance;Decreased cognition;Decreased safety awareness       PT Treatment Interventions DME instruction;Functional mobility training;Therapeutic activities;Therapeutic exercise;Balance training;Stair training;Gait training;Patient/family education    PT Goals (Current goals can be found in the Care Plan section)  Acute Rehab PT Goals Patient Stated Goal: to get something to eat and drink PT Goal Formulation: With patient Time For Goal Achievement: 05/17/20 Potential to Achieve Goals: Good    Frequency Min 3X/week   Barriers to discharge        Co-evaluation PT/OT/SLP Co-Evaluation/Treatment: Yes Reason for Co-Treatment: For patient/therapist safety;To address functional/ADL transfers PT goals addressed during session: Mobility/safety with mobility         AM-PAC PT "6 Clicks" Mobility  Outcome Measure Help needed turning from your back to your side while in a flat bed without using bedrails?: A Lot Help needed moving from lying on your back to sitting on the side of a flat bed without using bedrails?: A Lot Help needed moving to and from a bed to a chair (including a wheelchair)?: Total Help needed standing up from a chair using your arms (e.g.,  wheelchair or bedside chair)?: A Lot Help needed to walk in hospital room?: Total Help needed climbing 3-5 steps with a railing? : Total 6 Click Score: 9    End of Session Equipment Utilized During Treatment: Gait belt Activity Tolerance: Patient tolerated treatment well Patient left: in chair;with call bell/phone within reach;with chair alarm set Nurse Communication: Mobility status PT Visit Diagnosis: Unsteadiness on feet (R26.81);Muscle weakness (generalized) (M62.81);Difficulty in walking, not elsewhere classified (R26.2)    Time: 1610-9604 PT Time Calculation (min) (ACUTE ONLY): 39 min   Charges:   PT Evaluation $PT Eval Moderate Complexity: 1 Mod          Lillia Pauls, PT, DPT Acute Rehabilitation Services Pager [phone] Office [phone]   Gail Edwards 05/03/2020, 4:09 PM

## 2020-05-03 NOTE — Evaluation (Signed)
Occupational Therapy Evaluation Patient Details Name: Gail Edwards MRN: [id number] DOB: 1955/11/10 Today's Date: 05/03/2020    History of Present Illness 65 yo female admitted garbled speech and L facial droop. TPA given on 3/10 worsening symptoms MR (+) small IVH vs ventriculitis intubation decline in mental status 3/11lumbar punture reveals bacterial mengitis PMH HIV prediabetic ORIF humerus R 2018   Clinical Impression   Patient admitted for bacterial meningitis resulting in functional limitations due to the deficits listed below (see OT problem list). Pt currently requires mod (A) for bed mobility and total +2 max for basic transfers. Son is a Naval architect and taking FMLA to be with patient at [facility]. Pt has extended family ( brother in [place]) Pt will need to d/c home at MOD I level .  Patient will benefit from skilled OT acutely to increase independence and safety with ADLS to allow discharge CIR.     Follow Up Recommendations  CIR    Equipment Recommendations  3 in 1 bedside commode    Recommendations for Other Services Rehab consult     Precautions / Restrictions Precautions Precautions: Fall      Mobility Bed Mobility Overal bed mobility: Needs Assistance Bed Mobility: Supine to Sit;Rolling;Sit to Supine Rolling: Mod assist   Supine to sit: Mod assist     General bed mobility comments: pt requires (A) to initiate. pt initiates rolling but when given cues to sit up delayed response to command. Pt needs additional cues again to initiate. pt static sitting min (A)    Transfers Overall transfer level: Needs assistance Equipment used: 2 person hand held assist Transfers: Sit to/from [employer] Sit to Stand: +2 physical assistance;Mod assist Stand pivot transfers: +2 physical assistance;Max assist       General transfer comment: pt with posterior lean and extension with actual transfer. Therapist swinging patient hips toward chair. pt reports  dizzines and HA with transfer.    Balance Overall balance assessment: Needs assistance Sitting-balance support: Bilateral upper extremity supported;Feet supported Sitting balance-Leahy Scale: Poor     Standing balance support: Bilateral upper extremity supported;During functional activity Standing balance-Leahy Scale: Poor                             ADL either performed or assessed with clinical judgement   ADL Overall ADL's : Needs assistance/impaired Eating/Feeding: NPO Eating/Feeding Details (indicate cue type and reason): notified SLP of extubation, notified RN of cancelled SLP consult need for new orders if appropriate Grooming: Wash/dry face;Bed level;Supervision/safety Grooming Details (indicate cue type and reason): supine washing mouth Upper Body Bathing: Moderate assistance   Lower Body Bathing: Maximal assistance   Upper Body Dressing : Moderate assistance   Lower Body Dressing: Maximal assistance   Toilet Transfer: +2 for physical assistance;Maximal assistance;BSC Toilet Transfer Details (indicate cue type and reason): simulated bil LE extended, fixated gaze, posterior lean, strong grasp to therapists   Toileting - Clothing Manipulation Details (indicate cue type and reason): total (A) supine for peri care after incontience of stool and lack of awareness       General ADL Comments: incontinence of stool and lack of awareness     Vision Baseline Vision/History: Wears glasses Additional Comments: glasses not present     Perception     Praxis      Pertinent Vitals/Pain Pain Assessment: No/denies pain     Hand Dominance Right   Extremity/Trunk Assessment Upper Extremity Assessment Upper Extremity Assessment:  Generalized weakness       Cervical / Trunk Assessment Cervical / Trunk Assessment: Other exceptions (rounded shoulders) Cervical / Trunk Exceptions: rounded shoulders due to body habitus. pt noted to have a large lump of tissue on  the R side of neck compared to L side of neck. pt reports R has been larger than L prior to admission RN in room and aware   Communication Communication Communication: No difficulties   Cognition Arousal/Alertness: Awake/alert Behavior During Therapy: [facility] for tasks assessed/performed Overall Cognitive Status: Impaired/Different from baseline Area of Impairment: Orientation                 Orientation Level: Disoriented to;Time             General Comments: reports it is March 2020 with cues able to state 2022. pt able to recall after . pt very slow to respond at times. pt looking at therapist but not hearing therapist. Pt reports hearing changes   General Comments  VSS- RA 96%- pt with decreased responses and longer delays to commands with upright posture. pt with feet up in chair and reclined some with more brisk responses. pt saying abc all the way to V and terminates task.    Exercises     Shoulder Instructions      Home Living Family/patient expects to be discharged to:: Private residence Living Arrangements: Alone Available Help at Discharge: Family;Available PRN/intermittently Type of Home: House Home Access: Stairs to enter [employer] of Steps: 2 Entrance Stairs-Rails: None Home Layout: Two level;Full bath on main level;Bed/bath upstairs     Bathroom Shower/Tub: Producer, television/film/video: Standard     Home Equipment: Environmental consultant - 2 wheels;Adaptive equipment;Shower Engineering geologist: Educational psychologist Comments: son lives in [place] ( Elsa goes by [name])      Prior Functioning/Environment Level of Independence: Independent        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        OT Problem List: Decreased strength;Decreased activity tolerance;Impaired balance (sitting and/or standing);Decreased coordination;Decreased cognition;Decreased safety awareness;Decreased knowledge of use of DME or AE;Obesity       OT Treatment/Interventions: Self-care/ADL training;Neuromuscular education;Therapeutic exercise;Energy conservation;DME and/or AE instruction;Manual therapy;Modalities;Therapeutic activities;Visual/perceptual remediation/compensation;Patient/family education;Balance training;Cognitive remediation/compensation    OT Goals(Current goals can be found in the care plan section) Acute Rehab OT Goals Patient Stated Goal: to get something to eat and drink OT Goal Formulation: With patient Time For Goal Achievement: 05/17/20 Potential to Achieve Goals: Good  OT Frequency: Min 2X/week   Barriers to D/C: Decreased caregiver support          Co-evaluation PT/OT/SLP Co-Evaluation/Treatment: Yes Reason for Co-Treatment: For patient/therapist safety;Necessary to address cognition/behavior during functional activity;Complexity of the patient's impairments (multi-system involvement)   OT goals addressed during session: ADL's and self-care;Proper use of Adaptive equipment and DME;Strengthening/ROM      AM-PAC OT "6 Clicks" Daily Activity     Outcome Measure Help from another person eating meals?: A Little Help from another person taking care of personal grooming?: A Little Help from another person toileting, which includes using toliet, bedpan, or urinal?: A Lot Help from another person bathing (including washing, rinsing, drying)?: A Lot Help from another person to put on and taking off regular upper body clothing?: A Lot Help from another person to put on and taking off regular lower body clothing?: A Lot 6 Click Score: 14   End of Session Equipment Utilized During Treatment: Gait belt  Nurse Communication: Mobility status;Precautions  Activity Tolerance: Patient tolerated treatment well Patient left: in chair;with call bell/phone within reach;with chair alarm set  OT Visit Diagnosis: Unsteadiness on feet (R26.81);Muscle weakness (generalized) (M62.81)                Time: 8502-7741 OT Time  Calculation (min): 39 min Charges:  OT General Charges $OT Visit: 1 Visit OT Evaluation $OT Eval Moderate Complexity: 1 Mod OT Treatments $Self Care/Home Management : 38-52 mins   Brynn, OTR/L  Acute Rehabilitation Services Pager: [phone] Office: [phone] .   Mateo Flow 05/03/2020, 3:51 PM

## 2020-05-03 NOTE — Progress Notes (Signed)
OT Cancellation Note  Patient Details Name: Gail Edwards MRN: [id number] DOB: 03/12/1955   Cancelled Treatment:    Reason Eval/Treat Not Completed: Active bedrest order OT order received and appreciated however this conflicts with current bedrest order set. Please increase activity tolerance as appropriate and remove bedrest from orders. . Please contact OT at [phone] if bed rest order is discontinued. OT will hold evaluation at this time and will check back as time allows pending increased activity orders.   Wynona Neat, OTR/L  Acute Rehabilitation Services Pager: [phone] Office: [phone] .  05/03/2020, 8:35 AM

## 2020-05-03 NOTE — Progress Notes (Signed)
Inpatient Rehab Admissions Coordinator Note:   Per PT/OT recommendations, pt was screened for CIR candidacy by Wolfgang Phoenix, MS, CCC-SLP.  At this time we are recommending an inpatient rehab consult.  AC will contact attending to request rehab consult.  Please contact me with questions.    Wolfgang Phoenix, MS, CCC-SLP Admissions Coordinator [phone] 05/03/20 5:04 PM

## 2020-05-03 NOTE — Progress Notes (Signed)
[facility] Infectious Diseases Follow Up Note  Patient Identification: Patient Name: Gail Edwards MRN: [id number] Admit Date: 04/30/2020  9:02 PM Age: 65 y.o.Today's Date: 05/03/2020   Reason for Visit: Fu on meningitis and ventriculitis, HIV  Active Problems:   HIV disease (HCC)   Stroke (cerebrum) (HCC)   Abnormal MRI   Cerebral ventriculitis   Antibiotics:  3/10-c acyclovir 10 mg/kg q8hours 3/10-c ampicillin 3/10-c ceftriaxone 2q12 3/10-c vanc   Lines/Tubes: PIVs, NG tube, uretheral catheter  Interval Events: Awake, alert and following commands, plan to extubate soon    Assessment Acute respiratory failure in the setting of presumed CVA s/p TPA with negative evidence of stroke per Imaging  CTA head and neck wth no obstruction. TTE no vegetations   H/o headaches prior to admit  Meningitis/ventriculitis  3/11 CSF no organisms in Gram stain, WBC 1780, N 87, G 79, Total protein 197, Cx pending.  CSF path mark acute inflammation  Toxoplasma gondii IgG positive. Less likely to be Toxoplasma encephalitis given well controlled HIV prior to admit with no suggestive CNS lesions in Brain MRI  Cryptococcal serum ag negative  Suspect bacterial etiology given CSF findings. Less likely to be OIs given well controlled HIV   HIV - HIV RNA <20, Cd4 121(20%) on 05/02/20. The acute drop in CD4 count is likely 2/2 acute infection/stress. Her prior CD4 values in 2013 has been in 700-800s. Hence, no ned for PJP ppx for now  Recommendations Continue current antimicrobial coverage while waiting for CSF cultures Acyclovir has been dced given negative CSF HSV PCR. Steroids per CCM Vent management per CCM   Rest of the management as per the primary team. Thank you for the consult. Please page with pertinent questions or  concerns.  ______________________________________________________________________ Subjective patient seen and examined at the bedside. Intubated, awake, alert and following commands.    Vitals BP (!) 142/57   Pulse 86   Temp 99.9 F (37.7 C) (Axillary)   Resp (!) 26   Ht 5\' 6"  (1.676 m)   Wt 112.7 kg   SpO2 96%   BMI 40.10 kg/m     Physical Exam Lying in bed, obese HEENT - no pallor or icterus Short and thick neck Clear air sounds b/l anteriorly Normal s1s2, RRR, no murmur Abdomen - soft and large Ext - minimal pedal edema Skin - no obvious rashes   Pertinent Microbiology Results for orders placed or performed during the hospital encounter of 04/30/20  SARS CORONAVIRUS 2 (TAT 6-24 HRS) Nasopharyngeal Nasopharyngeal Swab     Status: None   Collection Time: 04/30/20 10:54 PM   Specimen: Nasopharyngeal Swab  Result Value Ref Range Status   SARS Coronavirus 2 NEGATIVE NEGATIVE Final    Comment: (NOTE) SARS-CoV-2 target nucleic acids are NOT DETECTED.  The SARS-CoV-2 RNA is generally detectable in upper and lower respiratory specimens during the acute phase of infection. Negative results do not preclude SARS-CoV-2 infection, do not rule out co-infections with other pathogens, and should not be used as the sole basis for treatment or other patient management decisions. Negative results must be combined with clinical observations, patient history, and epidemiological information. The expected result is Negative.  Fact Sheet for Patients: 06/30/20  Fact Sheet for Healthcare Providers: HairSlick.no  This test is not yet approved or cleared by the quierodirigir.com FDA and  has been authorized for detection and/or diagnosis of SARS-CoV-2 by FDA under an Emergency Use Authorization (EUA). This EUA will remain  in effect (meaning this  test can be used) for the duration of the COVID-19 declaration under Se ction  564(b)(1) of the Act, 21 U.S.C. section 360bbb-3(b)(1), unless the authorization is terminated or revoked sooner.  Performed at [facility], [address] [place], [place] [address]   MRSA PCR Screening     Status: None   Collection Time: 05/01/20  6:38 AM   Specimen: Nasal Mucosa; Nasopharyngeal  Result Value Ref Range Status   MRSA by PCR NEGATIVE NEGATIVE Final    Comment:        The GeneXpert MRSA Assay (FDA approved for NASAL specimens only), is one component of a comprehensive MRSA colonization surveillance program. It is not intended to diagnose MRSA infection nor to guide or monitor treatment for MRSA infections. Performed at [facility], [address], [place], [place] [address]   Culture, blood (routine x 2)     Status: None (Preliminary result)   Collection Time: 05/02/20 12:31 AM   Specimen: BLOOD RIGHT HAND  Result Value Ref Range Status   Specimen Description BLOOD RIGHT HAND  Final   Special Requests   Final    BOTTLES DRAWN AEROBIC AND ANAEROBIC Blood Culture adequate volume   Culture   Final    NO GROWTH < 12 HOURS Performed at [facility], [address], [place], [place] [address]    Report Status PENDING  Incomplete  Culture, blood (routine x 2)     Status: None (Preliminary result)   Collection Time: 05/02/20 12:31 AM   Specimen: BLOOD LEFT HAND  Result Value Ref Range Status   Specimen Description BLOOD LEFT HAND  Final   Special Requests   Final    BOTTLES DRAWN AEROBIC AND ANAEROBIC Blood Culture results may not be optimal due to an excessive volume of blood received in culture bottles   Culture   Final    NO GROWTH < 12 HOURS Performed at [facility], [address], [place], [place] [address]    Report Status PENDING  Incomplete  CSF culture w Stat Gram Stain     Status: None (Preliminary result)   Collection Time: 05/02/20  1:48 PM   Specimen: CSF; Cerebrospinal Fluid  Result Value Ref Range Status    Specimen Description CSF  Final   Special Requests NONE  Final   Gram Stain   Final    WBC PRESENT,BOTH PMN AND MONONUCLEAR NO ORGANISMS SEEN CYTOSPIN SMEAR Performed at [facility], [address] [place], [place] [address]    Culture PENDING  Incomplete   Report Status PENDING  Incomplete    Pertinent Lab. CBC Latest Ref Rng & Units 05/02/2020 05/01/2020 05/01/2020  WBC 4.0 - 10.5 K/uL 14.6(H) - -  Hemoglobin 12.0 - 15.0 g/dL 11.8(L) 12.2 13.3  Hematocrit 36.0 - 46.0 % 34.2(L) 36.0 39.0  Platelets 150 - 400 K/uL 238 - -   CMP Latest Ref Rng & Units 05/03/2020 05/02/2020 05/01/2020  Glucose 70 - 99 mg/dL 383(F) 383(A) -  BUN 8 - 23 mg/dL 91(B) 13 -  Creatinine 0.44 - 1.00 mg/dL 1.66 0.60 -  Sodium 045 - 145 mmol/L 139 134(L) 135  Potassium 3.5 - 5.1 mmol/L 3.6 3.7 3.2(L)  Chloride 98 - 111 mmol/L 111 106 -  CO2 22 - 32 mmol/L 20(L) 19(L) -  Calcium 8.9 - 10.3 mg/dL 9.9(H) 7.4(F) -  Total Protein 6.5 - 8.1 g/dL - 6.2(L) -  Total Bilirubin 0.3 - 1.2 mg/dL -  0.6 -  Alkaline Phos 38 - 126 U/L - 70 -  AST 15 - 41 U/L - 14(L) -  ALT 0 - 44 U/L - 15 -    Pertinent Imaging today Plain films and CT images have been personally visualized and interpreted; radiology reports have been reviewed. Decision making incorporated into the Impression / Recommendations.  I have spent approx 30 minutes for this patient encounter including review of prior medical records, coordination of care  with greater than 50% of time being face to face/counseling and discussing diagnostics/treatment plan with the patient/family.  Electronically signed by:   Odette Fraction, MD Infectious Disease Physician [facility] Pager: [phone]

## 2020-05-04 DIAGNOSIS — G039 Meningitis, unspecified: Secondary | ICD-10-CM | POA: Diagnosis present

## 2020-05-04 LAB — BASIC METABOLIC PANEL
Anion gap: 9 (ref 5–15)
BUN: 26 mg/dL — ABNORMAL HIGH (ref 8–23)
CO2: 22 mmol/L (ref 22–32)
Calcium: 8.5 mg/dL — ABNORMAL LOW (ref 8.9–10.3)
Chloride: 114 mmol/L — ABNORMAL HIGH (ref 98–111)
Creatinine, Ser: 0.72 mg/dL (ref 0.44–1.00)
GFR, Estimated: >60 mL/min (ref >60)
Glucose, Bld: 133 mg/dL — ABNORMAL HIGH (ref 70–99)
Potassium: 3.5 mmol/L (ref 3.5–5.1)
Sodium: 145 mmol/L (ref 135–145)

## 2020-05-04 LAB — GLUCOSE, CAPILLARY
Glucose-Capillary: 124 mg/dL — ABNORMAL HIGH (ref 70–99)
Glucose-Capillary: 135 mg/dL — ABNORMAL HIGH (ref 70–99)
Glucose-Capillary: 152 mg/dL — ABNORMAL HIGH (ref 70–99)
Glucose-Capillary: 152 mg/dL — ABNORMAL HIGH (ref 70–99)
Glucose-Capillary: 179 mg/dL — ABNORMAL HIGH (ref 70–99)
Glucose-Capillary: 180 mg/dL — ABNORMAL HIGH (ref 70–99)

## 2020-05-04 LAB — URINE CULTURE: Culture: >=100000 — AB

## 2020-05-04 LAB — ACID FAST SMEAR (AFB, MYCOBACTERIA)

## 2020-05-04 LAB — PHOSPHORUS: Phosphorus: 2.7 mg/dL (ref 2.5–4.6)

## 2020-05-04 LAB — EPSTEIN BARR VRS(EBV DNA BY PCR): EBV DNA QN by PCR: NEGATIVE IU/mL

## 2020-05-04 LAB — VANCOMYCIN, TROUGH: Vancomycin Tr: 13 ug/mL — ABNORMAL LOW (ref 15–20)

## 2020-05-04 MED ORDER — LOSARTAN POTASSIUM 25 MG PO TABS
25.0000 mg | ORAL_TABLET | Freq: Every day | ORAL | Status: DC
  Administered 2020-05-04 – 2020-05-07 (×4): 25 mg via ORAL
  Filled 2020-05-04 (×4): qty 1

## 2020-05-04 MED ORDER — VANCOMYCIN HCL 1000 MG/200ML IV SOLN
1000.0000 mg | Freq: Two times a day (BID) | INTRAVENOUS | Status: DC
  Administered 2020-05-05 – 2020-05-06 (×3): 1000 mg via INTRAVENOUS
  Filled 2020-05-04 (×4): qty 200

## 2020-05-04 NOTE — PMR Pre-admission (Incomplete)
PMR Admission Coordinator Pre-Admission Assessment  Patient: Gail Edwards is an 65 y.o., female MRN: [id number] DOB: October 21, 1955 Height: 5\' 6"  (167.6 cm) Weight: 112.7 kg  Insurance Information HMO: ***    PPO: ***     PCP:      IPA:      80/20:      OTHER:  PRIMARY: [employer]      Policy#:      Subscriber: patient CM Name: ***      Phone#: ***     Fax#: *** Pre-Cert#: ***      Employer: *** Benefits:  Phone #: ***     Name: *** [phone]. Date: ***     Deduct: ***      Out of Pocket Max: ***      Life Max: *** CIR: ***      SNF: *** Outpatient: ***     Co-Pay: *** Home Health: ***      Co-Pay: *** DME: ***     Co-Pay: *** Providers: in-network SECONDARY:       Policy#:     Phone#:   Financial Counselor:       Phone#:   [facility]" for patients in Inpatient Rehabilitation Facilities with attached "Privacy Act Statement-Health Care Records" was provided and verbally reviewed with: N/A  Emergency Contact Information Contact Information    Name Relation Home Work [place], [place] Son   [phone]   NO, CONTACT    [phone]      Current Medical History  Patient Admitting Diagnosis: bacterial meningitis/ventriculitis History of Present Illness: *** Complete NIHSS TOTAL: 0  Patient's medical record from [facility] has been reviewed by the rehabilitation admission coordinator and physician.  Past Medical History  Past Medical History:  Diagnosis Date  . Abnormal Pap smear 2009   lsil-cin1  . HIV infection (HCC)   . Pre-diabetes     Family History   family history includes Cancer in her father; Diabetes in her mother.  Prior Rehab/Hospitalizations Has the patient had prior rehab or hospitalizations prior to admission? No  Has the patient had major surgery during 100 days prior to admission? No   Current Medications  Current Facility-Administered Medications:  .  [COMPLETED] alteplase (ACTIVASE) 1 mg/mL  infusion 90 mg, 90 mg, Intravenous, Once, Stopped at 04/30/20 2234 **FOLLOWED BY** 0.9 %  sodium chloride infusion, 50 mL, Intravenous, Once, 2235, MD .  0.9 %  sodium chloride infusion, , Intravenous, PRN, Caryl Pina, MD, Last Rate: 10 mL/hr at 05/04/20 1100, Infusion Verify at 05/04/20 1100 .  acetaminophen (TYLENOL) tablet 650 mg, 650 mg, Oral, Q4H PRN, 650 mg at 05/04/20 0300 **OR** acetaminophen (TYLENOL) 160 MG/5ML solution 650 mg, 650 mg, Per Tube, Q4H PRN, 650 mg at 05/02/20 1750 **OR** acetaminophen (TYLENOL) suppository 650 mg, 650 mg, Rectal, Q4H PRN, 07/02/20, MD, 650 mg at 05/01/20 1225 .  ampicillin (OMNIPEN) 2 g in sodium chloride 0.9 % 100 mL IVPB, 2 g, Intravenous, Q4H, Pashayan, 07/01/20, MD, Stopped at 05/04/20 [phone] .  cefTRIAXone (ROCEPHIN) 2 g in sodium chloride 0.9 % 100 mL IVPB, 2 g, Intravenous, Q12H, Pashayan, 2992, MD, Stopped at 05/04/20 1050 .  Chlorhexidine Gluconate Cloth 2 % PADS 6 each, 6 each, Topical, Daily, 05/06/20, MD, 6 each at 05/03/20 1330 .  dexamethasone (DECADRON) injection 10 mg, 10 mg, Intravenous, Q6H, 07/03/20, MD, 10 mg at 05/04/20 0620 .  emtricitabine-rilpivir-tenofovir AF (ODEFSEY) 200-25-25  MG per tablet 1 tablet, 1 tablet, Oral, Q breakfast, Cheri Fowlerhand, Sudham, MD, 1 tablet at 05/04/20 0856 .  enoxaparin (LOVENOX) injection 40 mg, 40 mg, Subcutaneous, Daily, Chand, Sudham, MD, 40 mg at 05/04/20 1020 .  hydrALAZINE (APRESOLINE) injection 10-20 mg, 10-20 mg, Intravenous, Q4H PRN, Cheri Fowlerhand, Sudham, MD, 20 mg at 05/04/20 1023 .  insulin aspart (novoLOG) injection 0-20 Units, 0-20 Units, Subcutaneous, Q4H, Cheri Fowlerhand, Sudham, MD, 3 Units at 05/04/20 0930 .  labetalol (NORMODYNE) injection 10 mg, 10 mg, Intravenous, Q2H PRN, Cheri Fowlerhand, Sudham, MD, 10 mg at 05/04/20 1055 .  multivitamin with minerals tablet 1 tablet, 1 tablet, Oral, Daily, Cheri Fowlerhand, Sudham, MD, 1 tablet at 05/04/20 1020 .  rocuronium bromide 10 mg/mL (PF) syringe, 50 mg,  Intravenous, Once, Chand, Garnet SierrasSudham, MD .  senna-docusate (Senokot-S) tablet 1 tablet, 1 tablet, Oral, QHS PRN, Cheri Fowlerhand, Sudham, MD .  sodium chloride flush (NS) 0.9 % injection 3 mL, 3 mL, Intravenous, Once, Adela LankFloyd, Dan, DO .  temazepam (RESTORIL) capsule 15 mg, 15 mg, Oral, QHS PRN, Merrily Pewhand, Sudham, MD .  vancomycin (VANCOREADY) IVPB 750 mg/150 mL, 750 mg, Intravenous, Q12H, von Pearletha FurlDohlen, Haley B, RPH, Stopped at 05/04/20 0401  Patients Current Diet:  Diet Order            Diet regular Room service appropriate? Yes; Fluid consistency: Thin  Diet effective now                 Precautions / Restrictions Precautions Precautions: Fall Restrictions Weight Bearing Restrictions: No   Has the patient had 2 or more falls or a fall with injury in the past year? No  Prior Activity Level Limited Community (1-2x/wk): 2-3 x/week out of house  Prior Functional Level Self Care: Did the patient need help bathing, dressing, using the toilet or eating? Independent  Indoor Mobility: Did the patient need assistance with walking from room to room (with or without device)? Independent  Stairs: Did the patient need assistance with internal or external stairs (with or without device)? Independent  Functional Cognition: Did the patient need help planning regular tasks such as shopping or remembering to take medications? Independent  Home Assistive Devices / Equipment Home Equipment: Walker - 2 wheels,Adaptive equipment,Shower seat  Prior Device Use: Indicate devices/aids used by the patient prior to current illness, exacerbation or injury? None of the above  Current Functional Level Cognition  Overall Cognitive Status: Impaired/Different from baseline Orientation Level: Oriented X4 General Comments: reports it is March 2020 with cues able to state 2022. pt able to recall after 5minutes. pt very slow to respond at times. pt looking at therapist but not hearing therapist. Pt reports hearing changes     Extremity Assessment (includes Sensation/Coordination)  Upper Extremity Assessment: Defer to OT evaluation  Lower Extremity Assessment: Generalized weakness    ADLs  Overall ADL's : Needs assistance/impaired Eating/Feeding: NPO Eating/Feeding Details (indicate cue type and reason): notified SLP of extubation, notified RN of cancelled SLP consult need for new orders if appropriate Grooming: Wash/dry face,Bed level,Supervision/safety Grooming Details (indicate cue type and reason): supine washing mouth Upper Body Bathing: Moderate assistance Lower Body Bathing: Maximal assistance Upper Body Dressing : Moderate assistance Lower Body Dressing: Maximal assistance Toilet Transfer: +2 for physical assistance,Maximal assistance,BSC Toilet Transfer Details (indicate cue type and reason): simulated bil LE extended, fixated gaze, posterior lean, strong grasp to therapists Toileting - Clothing Manipulation Details (indicate cue type and reason): total (A) supine for peri care after incontience of stool and lack of awareness General  ADL Comments: incontinence of stool and lack of awareness    Mobility  Overal bed mobility: Needs Assistance Bed Mobility: Supine to Sit,Rolling,Sit to Supine Rolling: Mod assist Supine to sit: Mod assist General bed mobility comments: pt requires (A) to initiate. pt initiates rolling but when given cues to sit up delayed response to command. Pt needs additional cues again to initiate. pt static sitting min (A)    Transfers  Overall transfer level: Needs assistance Equipment used: 2 person hand held assist Transfers: Sit to/from [employer] Sit to Stand: +2 physical assistance,Mod assist Stand pivot transfers: +2 physical assistance,Max assist General transfer comment: pt with posterior lean and extension with actual transfer. Therapist swinging patient hips toward chair. pt reports dizzines and HA with transfer.    Ambulation / Gait / Stairs /  Engineer, drilling / Balance Balance Overall balance assessment: Needs assistance Sitting-balance support: Bilateral upper extremity supported,Feet supported Sitting balance-Leahy Scale: Poor Standing balance support: Bilateral upper extremity supported,During functional activity Standing balance-Leahy Scale: Poor    Special needs/care consideration Skin Rash: lower abdomen, Diabetic management novoLOG 0-20 units every 4 hours, Droplet precautions and Designated visitor Eyvonne Left, son   Previous Surveyor, minerals (from acute therapy documentation) Living Arrangements: Alone Available Help at Discharge: Family,Available PRN/intermittently Type of Home: House Home Layout: Two level,Full bath on main level Alternate Level Stairs-Rails: Right Alternate Level Stairs-Number of Steps: 10 Home Access: Stairs to enter Entrance Stairs-Rails: None Entrance Stairs-Number of Steps: 2 Bathroom Shower/Tub: Health visitor: Handicapped height Home Care Services: No Additional Comments: son lives in [place] ( [place] goes by [name])  Discharge Living Setting Plans for Discharge Living Setting: Patient's home Type of Home at Discharge: House Discharge Home Layout: Two level,Full bath on main level Alternate Level Stairs-Rails: Right Alternate Level Stairs-Number of Steps: 10 Discharge Home Access: Stairs to enter Entrance Stairs-Rails: None Entrance Stairs-Number of Steps: 2 Discharge Bathroom Shower/Tub: Walk-in shower Discharge Bathroom Toilet: Handicapped height Does the patient have any problems obtaining your medications?: No  Social/Family/Support Systems Anticipated Caregiver: Eyvonne Left, son Caregiver Availability: Intermittent  Goals Patient/Family Goal for Rehab: *** Expected length of stay: ***  Decrease burden of Care through IP rehab admission: NA  Possible need for SNF placement upon discharge: Not anticipated  Patient Condition:  {PATIENT'S CONDITION:22832}  Preadmission Screen Completed By:  Domingo Pulse, 05/04/2020 11:46 AM ______________________________________________________________________   Discussed status with Dr. Marland Kitchen on *** at *** and received approval for admission today.  Admission Coordinator:  Domingo Pulse, CCC-SLP, time ***Dorna Bloom ***   Assessment/Plan: Diagnosis: 1. Does the need for close, 24 hr/day Medical supervision in concert with the patient's rehab needs make it unreasonable for this patient to be served in a less intensive setting? {yes_no_potentially:3041433} 2. Co-Morbidities requiring supervision/potential complications: *** 3. Due to {due OM:3559741}, does the patient require 24 hr/day rehab nursing? {yes_no_potentially:3041433} 4. Does the patient require coordinated care of a physician, rehab nurse, PT, OT, and SLP to address physical and functional deficits in the context of the above medical diagnosis(es)? {yes_no_potentially:3041433} Addressing deficits in the following areas: {deficits:3041436} 5. Can the patient actively participate in an intensive therapy program of at least 3 hrs of therapy 5 days a week? {yes_no_potentially:3041433} 6. The potential for patient to make measurable gains while on inpatient rehab is {potential:3041437} 7. Anticipated functional outcomes upon discharge from inpatient rehab: {functional outcomes:304600100} PT, {functional outcomes:304600100} OT, {functional outcomes:304600100} SLP 8. Estimated rehab length  of stay to reach the above functional goals is: *** 9. Anticipated discharge destination: {anticipated dc setting:21604} 10. Overall Rehab/Functional Prognosis: {potential:3041437}   MD Signature: ***

## 2020-05-04 NOTE — Progress Notes (Signed)
Physical Therapy Treatment Patient Details Name: Gail Edwards MRN: [id number] DOB: 1955/11/20 Today's Date: 05/04/2020    History of Present Illness 65 yo female admitted garbled speech and L facial droop. TPA given on 3/10 worsening symptoms MR (+) small IVH vs ventriculitis intubation decline in mental status 3/11lumbar punture reveals bacterial mengitis PMH HIV prediabetic ORIF humerus R 2018    PT Comments    Pt making steady progress towards her physical therapy goals. Requiring min assist for functional mobility. Ambulating x 10 feet with a walker, very slow and effortful. Pt continues with hearing difficulties, which is new. Overall, very pleasant and motivated to participate in therapy. Continue to recommend comprehensive inpatient rehab (CIR) for post-acute therapy needs.    Follow Up Recommendations  CIR     Equipment Recommendations  3in1 (PT)    Recommendations for Other Services       Precautions / Restrictions Precautions Precautions: Fall Restrictions Weight Bearing Restrictions: No    Mobility  Bed Mobility Overal bed mobility: Needs Assistance Bed Mobility: Supine to Sit     Supine to sit: Min assist     General bed mobility comments: Cues for progressing BLE's off edge of bed and use of rail, minA for trunk to upright. Increased time for reciprocal scooting    Transfers Overall transfer level: Needs assistance Equipment used: Rolling walker (2 wheeled)             General transfer comment: MinA to rise to stand, cues for hand placement  Ambulation/Gait Ambulation/Gait assistance: Min assist Gait Distance (Feet): 10 Feet Assistive device: Rolling walker (2 wheeled) Gait Pattern/deviations: Step-through pattern;Decreased stride length Gait velocity: decreased Gait velocity interpretation: <1.31 ft/sec, indicative of household ambulator General Gait Details: Cues for walker use, sequencing, slow and effortful pace. No overt LOB   Stairs              Wheelchair Mobility    Modified Rankin (Stroke Patients Only)       Balance Overall balance assessment: Needs assistance Sitting-balance support: Feet supported Sitting balance-Leahy Scale: Fair     Standing balance support: Bilateral upper extremity supported;During functional activity Standing balance-Leahy Scale: Poor                              Cognition Arousal/Alertness: Awake/alert Behavior During Therapy: WFL for tasks assessed/performed Overall Cognitive Status: Impaired/Different from baseline Area of Impairment: Problem solving                             Problem Solving: Slow processing;Difficulty sequencing General Comments: Mildly slower processing times, HOH which is new      Exercises      General Comments        Pertinent Vitals/Pain Pain Assessment: Faces Faces Pain Scale: Hurts little more Pain Location: headache Pain Descriptors / Indicators: Headache Pain Intervention(s): Monitored during session;Premedicated before session    Home Living   Living Arrangements: Alone Available Help at Discharge: Family;Available PRN/intermittently Type of Home: House Home Access: Stairs to enter Entrance Stairs-Rails: None Home Layout: Two level;Full bath on main level        Prior Function            PT Goals (current goals can now be found in the care plan section) Acute Rehab PT Goals Patient Stated Goal: Pt son would like to know level of assist she might need  PT Goal Formulation: With patient Time For Goal Achievement: 05/17/20 Potential to Achieve Goals: Good Progress towards PT goals: Progressing toward goals    Frequency    Min 3X/week      PT Plan Current plan remains appropriate    Co-evaluation              AM-PAC PT "6 Clicks" Mobility   Outcome Measure  Help needed turning from your back to your side while in a flat bed without using bedrails?: A Little Help needed moving  from lying on your back to sitting on the side of a flat bed without using bedrails?: A Little Help needed moving to and from a bed to a chair (including a wheelchair)?: A Little Help needed standing up from a chair using your arms (e.g., wheelchair or bedside chair)?: A Little Help needed to walk in hospital room?: A Little Help needed climbing 3-5 steps with a railing? : A Lot 6 Click Score: 17    End of Session Equipment Utilized During Treatment: Gait belt Activity Tolerance: Patient tolerated treatment well Patient left: in chair;with call bell/phone within reach;with chair alarm set Nurse Communication: Mobility status PT Visit Diagnosis: Unsteadiness on feet (R26.81);Muscle weakness (generalized) (M62.81);Difficulty in walking, not elsewhere classified (R26.2)     Time: 1235-1300 PT Time Calculation (min) (ACUTE ONLY): 25 min  Charges:  $Gait Training: 8-22 mins $Therapeutic Activity: 8-22 mins                     Lillia Pauls, PT, DPT Acute Rehabilitation Services Pager [phone] Office [phone]    Norval Morton 05/04/2020, 2:41 PM

## 2020-05-04 NOTE — Progress Notes (Signed)
Pharmacy Antibiotic Note  Gail Edwards is a 65 y.o. female with hx HIV admitted on 04/30/2020 with acute ischemic stroke, s/p TPA 3/9 PM.  Pharmacy has been consulted for vancomycin/acyclovir/ceftriaxone/ampillin dosing for r/o meningitis.   Vancomycin trough slightly subtherapeutic at 13 mcg/ml, was drawn appropriately. SCr has remained stable.  Plan: Increase vancomycin to 1000mg  IV q12h Continue ceftriaxone 2g IV q12h Continue ampicillin 2g IV q4h  Height: 5\' 6"  (167.6 cm) Weight: 112.7 kg (248 lb 7.3 oz) IBW/kg (Calculated) : 59.3  Temp (24hrs), Avg:98.5 F (36.9 C), Min:98 F (36.7 C), Max:99.1 F (37.3 C)  Recent Labs  Lab 04/30/20 2106 04/30/20 2131 05/02/20 0031 05/03/20 0433 05/04/20 0421  WBC 10.7*  --  14.6*  --   --   CREATININE 0.88 0.80 0.87 0.78 0.72    Estimated Creatinine Clearance: 91.7 mL/min (by C-G formula based on SCr of 0.72 mg/dL).    Allergies  Allergen Reactions  . Codeine Other (See Comments)    Headache    07/03/20, PharmD, BCPS, [facility] Clinical Pharmacist [phone] Please check AMION for all [facility] Pharmacy numbers 05/04/2020

## 2020-05-04 NOTE — Progress Notes (Addendum)
PROGRESS NOTE  Gail Edwards WUJ:[id number] DOB: 1956-02-04 DOA: 04/30/2020 PCP: [name], Rupashree, MD   LOS: 4 days   Brief narrative: Patient is a 65 years old female with past medical history of HIV presented to hospital on 04/30/2020 with garbled speech and left facial droop.  She was initially treated with TPA for possible stroke.  On 05/02/2019 patient developed worsening confusion.  MRI of the brain was performed which showed a small intraventricular hemorrhage versus possible ventriculitis.  PCCM was consulted due to progressive decline in mental status and patient needing intubation.    Subsequently, ID was consulted.  Patient was given antibiotics after lumbar puncture was highly suspected for bacterial meningitis.  Assessment/Plan:  Principal Problem:   Meningitis Active Problems:   HIV disease (HCC)   Abnormal MRI   Cerebral ventriculitis  Acute  metabolic encephalopathy due to acute bacterial meningitis/ventriculitis LP performed was consistent with acute bacterial meningitis.  Patient was initially on acyclovir which was discontinued.  Currently on ampicillin ceftriaxone and vancomycin.  Continue IV steroid to complete total 4-day course.  Patient has improved at this time.  Has mild headache.  Echocardiogram without any vegetations.  CSF culture with with no growth so far.  Toxoplasma IgG positive had good HIV control .  Cryptococcal serum antigen negative.  HIV RNA less than 20.  CD4 count 121 on 05/03/2018  Acute respiratory failure with hypoxia Patient was intubated for worsening mental status.  Currently on room air after extubation.    Hyponatremia resolved.  Sodium of 145 today.  Closely monitor.  Elevated blood pressure.  History of hypertension.  Not on medication.  We will put the patient on losartan low-dose starting today.  HIV Followed by Dr. Ninetta LightsHatcher outpatient.  Continue Emtricitabine-Rilpivir-Tenofovir   Prediabetes.  Latest A1c of 5.7.  Not on  medications at home.  Morbid obesity Would benefit from ongoing weight loss as outpatient.  Debility, deconditioning.  Patient has been seen by physical therapy recommended CIR.  PMR has been consulted   DVT prophylaxis: enoxaparin (LOVENOX) injection 40 mg Start: 05/03/20 1000 SCDs Start: 05/03/20 0930 SCD's Start: 04/30/20 2155    Code Status: Full code  Family Communication: None  Status is: Inpatient  Remains inpatient appropriate because:IV treatments appropriate due to intensity of illness or inability to take PO, Inpatient level of care appropriate due to severity of illness and IV antibiotics   Dispo: The patient is from: Home              Anticipated d/c is to: Home              Patient currently is not medically stable to d/c.   Difficult to place patient No  Consultants:  PCCM  Infectious disease  Neurology   Procedures:  NG tube placement,  Intubation and mechanical ventilation  Lumbar puncture  Anti-infectives: Marland Kitchen. Vancomycin, Rocephin, ampicillin  Anti-infectives (From admission, onward)   Start     Dose/Rate Route Frequency Ordered Stop   05/04/20 1000  emtricitabine-tenofovir AF (DESCOVY) 200-25 MG per tablet 1 tablet  Status:  Discontinued       "And" Linked Group Details   1 tablet Oral Daily 05/03/20 1254 05/03/20 1305   05/04/20 1000  dolutegravir (TIVICAY) tablet 50 mg  Status:  Discontinued       "And" Linked Group Details   50 mg Per Tube Daily 05/03/20 1254 05/03/20 1305   05/04/20 0800  emtricitabine-rilpivir-tenofovir AF (ODEFSEY) 200-25-25 MG per tablet 1 tablet  1 tablet Oral Daily with breakfast 05/03/20 1305     05/02/20 0200  vancomycin (VANCOREADY) IVPB 750 mg/150 mL        750 mg 150 mL/hr over 60 Minutes Intravenous Every 12 hours 05/01/20 1248     05/01/20 1430  dolutegravir (TIVICAY) tablet 50 mg  Status:  Discontinued       "And" Linked Group Details   50 mg Per Tube Daily 05/01/20 1337 05/03/20 1254   05/01/20  1430  emtricitabine-tenofovir AF (DESCOVY) 200-25 MG per tablet 1 tablet  Status:  Discontinued       "And" Linked Group Details   1 tablet Per Tube Daily 05/01/20 1337 05/03/20 1254   05/01/20 1400  acyclovir (ZOVIRAX) 775 mg in dextrose 5 % 150 mL IVPB  Status:  Discontinued        10 mg/kg  77.7 kg (Adjusted) 165.5 mL/hr over 60 Minutes Intravenous Every 8 hours 05/01/20 1246 05/02/20 1729   05/01/20 1330  vancomycin (VANCOCIN) 2,500 mg in sodium chloride 0.9 % 500 mL IVPB        2,500 mg 250 mL/hr over 120 Minutes Intravenous  Once 05/01/20 1241 05/01/20 1753   05/01/20 1315  cefTRIAXone (ROCEPHIN) 2 g in sodium chloride 0.9 % 100 mL IVPB        2 g 200 mL/hr over 30 Minutes Intravenous Every 12 hours 05/01/20 1225     05/01/20 1315  ampicillin (OMNIPEN) 2 g in sodium chloride 0.9 % 100 mL IVPB        2 g 300 mL/hr over 20 Minutes Intravenous Every 4 hours 05/01/20 1225     05/01/20 1200  emtricitabine-rilpivir-tenofovir AF (ODEFSEY) 200-25-25 MG per tablet 1 tablet  Status:  Discontinued        1 tablet Oral Daily with breakfast 05/01/20 0054 05/01/20 1337       Subjective: Today, patient was seen and examined at bedside.  Complains of mild headache.  Patient denies any nausea vomiting fever or chills.  Objective: Vitals:   05/04/20 1200 05/04/20 1237  BP: (!) 182/62 (!) 146/66  Pulse: (!) 103   Resp: (!) 27   Temp: 98.3 F (36.8 C)   SpO2: 97%     Intake/Output Summary (Last 24 hours) at 05/04/2020 1335 Last data filed at 05/04/2020 1100 Gross per 24 hour  Intake 2727.25 ml  Output 200 ml  Net 2527.25 ml   Filed Weights   05/01/20 1200 05/03/20 0300  Weight: 105.3 kg 112.7 kg   Body mass index is 40.1 kg/m.   Physical Exam: GENERAL: Patient is alert awake and oriented. Not in obvious distress.  Obese HENT: No scleral pallor or icterus. Pupils equally reactive to light. Oral mucosa is moist.  No neck stiffness noted. NECK: is supple, no gross swelling  noted. CHEST: Clear to auscultation. No crackles or wheezes.  Diminished breath sounds bilaterally. CVS: S1 and S2 heard, no murmur. Regular rate and rhythm.  ABDOMEN: Soft, non-tender, bowel sounds are present. EXTREMITIES: No edema. CNS: Cranial nerves are intact. No focal motor deficits. SKIN: warm and dry without rashes.  Data Review: I have personally reviewed the following laboratory data and studies,  CBC: Recent Labs  Lab 04/30/20 2106 04/30/20 2131 05/01/20 0905 05/01/20 1418 05/02/20 0031  WBC 10.7*  --   --   --  14.6*  NEUTROABS 9.6*  --   --   --   --   HGB 13.0 13.6 13.3 12.2 11.8*  HCT 38.5 40.0  39.0 36.0 34.2*  MCV 89.5  --   --   --  87.7  PLT 336  --   --   --  238   Basic Metabolic Panel: Recent Labs  Lab 04/30/20 2106 04/30/20 2131 05/01/20 0905 05/01/20 1405 05/01/20 1418 05/01/20 1700 05/02/20 0031 05/02/20 1637 05/03/20 0433 05/04/20 0421  NA 135 136 137  --  135  --  134*  --  139 145  K 4.1 4.1 3.6  --  3.2*  --  3.7  --  3.6 3.5  CL 105 104  --   --   --   --  106  --  111 114*  CO2 20*  --   --   --   --   --  19*  --  20* 22  GLUCOSE 107* 106*  --   --   --   --  237*  --  220* 133*  BUN 14 16  --   --   --   --  13  --  26* 26*  CREATININE 0.88 0.80  --   --   --   --  0.87  --  0.78 0.72  CALCIUM 9.1  --   --   --   --   --  8.6*  --  8.6* 8.5*  MG  --   --   --  1.8  --  1.8 1.9 2.4  --   --   PHOS  --   --   --  1.9*  --  2.8 2.7 1.9*  --  2.7   Liver Function Tests: Recent Labs  Lab 04/30/20 2106 05/02/20 0031  AST 20 14*  ALT 17 15  ALKPHOS 89 70  BILITOT 1.1 0.6  PROT 6.7 6.2*  ALBUMIN 3.9 3.1*   No results for input(s): LIPASE, AMYLASE in the last 168 hours. Recent Labs  Lab 05/01/20 1028  AMMONIA 33   Cardiac Enzymes: No results for input(s): CKTOTAL, CKMB, CKMBINDEX, TROPONINI in the last 168 hours. BNP (last 3 results) No results for input(s): BNP in the last 8760 hours.  ProBNP (last 3 results) No results  for input(s): PROBNP in the last 8760 hours.  CBG: Recent Labs  Lab 05/03/20 2044 05/03/20 2324 05/04/20 0345 05/04/20 0734 05/04/20 1133  GLUCAP 149* 125* 124* 135* 180*   Recent Results (from the past 240 hour(s))  SARS CORONAVIRUS 2 (TAT 6-24 HRS) Nasopharyngeal Nasopharyngeal Swab     Status: None   Collection Time: 04/30/20 10:54 PM   Specimen: Nasopharyngeal Swab  Result Value Ref Range Status   SARS Coronavirus 2 NEGATIVE NEGATIVE Final    Comment: (NOTE) SARS-CoV-2 target nucleic acids are NOT DETECTED.  The SARS-CoV-2 RNA is generally detectable in upper and lower respiratory specimens during the acute phase of infection. Negative results do not preclude SARS-CoV-2 infection, do not rule out co-infections with other pathogens, and should not be used as the sole basis for treatment or other patient management decisions. Negative results must be combined with clinical observations, patient history, and epidemiological information. The expected result is Negative.  Fact Sheet for Patients: HairSlick.no  Fact Sheet for Healthcare Providers: quierodirigir.com  This test is not yet approved or cleared by the [place] FDA and  has been authorized for detection and/or diagnosis of SARS-CoV-2 by FDA under an Emergency Use Authorization (EUA). This EUA will remain  in effect (meaning this test can be used) for the duration of the  COVID-19 declaration under Se ction 564(b)(1) of the Act, 21 U.S.C. section 360bbb-3(b)(1), unless the authorization is terminated or revoked sooner.  Performed at [facility], [address], [place], [place] [address]   MRSA PCR Screening     Status: None   Collection Time: 05/01/20  6:38 AM   Specimen: Nasal Mucosa; Nasopharyngeal  Result Value Ref Range Status   MRSA by PCR NEGATIVE NEGATIVE Final    Comment:        The GeneXpert MRSA Assay (FDA approved for NASAL  specimens only), is one component of a comprehensive MRSA colonization surveillance program. It is not intended to diagnose MRSA infection nor to guide or monitor treatment for MRSA infections. Performed at [facility], [address], [place], [place] [address]   Culture, Urine     Status: Abnormal   Collection Time: 05/01/20  4:29 PM   Specimen: Urine, Random  Result Value Ref Range Status   Specimen Description URINE, RANDOM  Final   Special Requests   Final    NONE Performed at [facility], [address], [place], [place] [address]    Culture >=100,000 COLONIES/mL KLEBSIELLA PNEUMONIAE (A)  Final   Report Status 05/04/2020 FINAL  Final   Organism ID, Bacteria KLEBSIELLA PNEUMONIAE (A)  Final      Susceptibility   Klebsiella pneumoniae - MIC*    AMPICILLIN >=32 RESISTANT Resistant     CEFAZOLIN <=4 SENSITIVE Sensitive     CEFEPIME <=0.12 SENSITIVE Sensitive     CEFTRIAXONE <=0.25 SENSITIVE Sensitive     CIPROFLOXACIN <=0.25 SENSITIVE Sensitive     GENTAMICIN <=1 SENSITIVE Sensitive     IMIPENEM <=0.25 SENSITIVE Sensitive     NITROFURANTOIN 128 RESISTANT Resistant     TRIMETH/SULFA <=20 SENSITIVE Sensitive     AMPICILLIN/SULBACTAM 8 SENSITIVE Sensitive     PIP/TAZO <=4 SENSITIVE Sensitive     * >=100,000 COLONIES/mL KLEBSIELLA PNEUMONIAE  Culture, blood (routine x 2)     Status: None (Preliminary result)   Collection Time: 05/02/20 12:31 AM   Specimen: BLOOD RIGHT HAND  Result Value Ref Range Status   Specimen Description BLOOD RIGHT HAND  Final   Special Requests   Final    BOTTLES DRAWN AEROBIC AND ANAEROBIC Blood Culture adequate volume   Culture   Final    NO GROWTH 1 DAY Performed at [facility], [address], [place], [place] [address]    Report Status PENDING  Incomplete  Culture, blood (routine x 2)     Status: None (Preliminary result)   Collection Time: 05/02/20 12:31 AM   Specimen: BLOOD LEFT HAND  Result Value Ref Range Status    Specimen Description BLOOD LEFT HAND  Final   Special Requests   Final    BOTTLES DRAWN AEROBIC AND ANAEROBIC Blood Culture results may not be optimal due to an excessive volume of blood received in culture bottles   Culture   Final    NO GROWTH 1 DAY Performed at [facility], [address], [place], [place] [address]    Report Status PENDING  Incomplete  Acid Fast Smear (AFB)     Status: None   Collection Time: 05/02/20  1:48 PM   Specimen: CSF; Cerebrospinal Fluid  Result Value Ref Range Status   AFB Specimen Processing Comment  Final    Comment: (NOTE) Straight Inoculation without Smear Performed At: [facility] [address] [place], [place] [id number] Jolene Schimke  MD LK:[id number]    Acid Fast Smear QNSAFB  Final    Comment: (NOTE) Test not performed. AFB Smear not performed due to specimen source (blood) or insufficient specimen.    Source (AFB) CSF  Final    Comment: Performed at [facility], [address], [place], [place] [address]  CSF culture w Stat Gram Stain     Status: None (Preliminary result)   Collection Time: 05/02/20  1:48 PM   Specimen: CSF; Cerebrospinal Fluid  Result Value Ref Range Status   Specimen Description CSF  Final   Special Requests NONE  Final   Gram Stain   Final    WBC PRESENT,BOTH PMN AND MONONUCLEAR NO ORGANISMS SEEN CYTOSPIN SMEAR    Culture   Final    NO GROWTH 2 DAYS Performed at [facility], [address], [place], [place] [address]    Report Status PENDING  Incomplete     Studies: No results found.    Joycelyn Das, MD  [facility] 05/04/2020  If 7PM-7AM, please contact night-coverage

## 2020-05-04 NOTE — Progress Notes (Signed)
Inpatient Rehab Admissions:  Inpatient Rehab Consult received.  I met with patient at the bedside for rehabilitation assessment and to discuss goals and expectations of an inpatient rehab admission.  She acknowledged understanding of goals and expectations. Pt interested in pursuing CIR.  Will continue to follow.  Signed: Gayland Curry, [place], [place] Admissions Coordinator [phone]

## 2020-05-05 DIAGNOSIS — G9600 Cerebrospinal fluid leak, unspecified: Secondary | ICD-10-CM

## 2020-05-05 DIAGNOSIS — G039 Meningitis, unspecified: Secondary | ICD-10-CM | POA: Diagnosis not present

## 2020-05-05 LAB — GLUCOSE, CAPILLARY
Glucose-Capillary: 147 mg/dL — ABNORMAL HIGH (ref 70–99)
Glucose-Capillary: 144 mg/dL — ABNORMAL HIGH (ref 70–99)
Glucose-Capillary: 156 mg/dL — ABNORMAL HIGH (ref 70–99)
Glucose-Capillary: 172 mg/dL — ABNORMAL HIGH (ref 70–99)
Glucose-Capillary: 144 mg/dL — ABNORMAL HIGH (ref 70–99)
Glucose-Capillary: 96 mg/dL (ref 70–99)

## 2020-05-05 LAB — CBC
HCT: 34.6 % — ABNORMAL LOW (ref 36.0–46.0)
Hemoglobin: 11.6 g/dL — ABNORMAL LOW (ref 12.0–15.0)
MCH: 29.5 pg (ref 26.0–34.0)
MCHC: 33.5 g/dL (ref 30.0–36.0)
MCV: 88 fL (ref 80.0–100.0)
Platelets: 310 10*3/uL (ref 150–400)
RBC: 3.93 MIL/uL (ref 3.87–5.11)
RDW: 15 % (ref 11.5–15.5)
WBC: 8.4 10*3/uL (ref 4.0–10.5)
nRBC: 0 % (ref 0.0–0.2)

## 2020-05-05 LAB — HISTOPLASMA ANTIGEN, URINE: Histoplasma Antigen, urine: <0.5 (ref <0.5)

## 2020-05-05 LAB — CSF CULTURE W GRAM STAIN: Culture: NO GROWTH

## 2020-05-05 LAB — CMV DNA, QUANTITATIVE, PCR
CMV DNA Quant: NEGATIVE IU/mL
Log10 CMV Qn DNA Pl: UNDETERMINED log10 IU/mL

## 2020-05-05 LAB — VDRL, CSF: VDRL Quant, CSF: NONREACTIVE

## 2020-05-05 MED ORDER — CALCIUM CARBONATE ANTACID 500 MG PO CHEW
1.0000 | CHEWABLE_TABLET | Freq: Three times a day (TID) | ORAL | Status: DC
  Administered 2020-05-05 – 2020-05-08 (×8): 200 mg via ORAL
  Filled 2020-05-05 (×7): qty 1

## 2020-05-05 MED ORDER — TEMAZEPAM 15 MG PO CAPS: ORAL_CAPSULE | ORAL | 0 refills | Status: DC

## 2020-05-05 NOTE — Progress Notes (Signed)
Physical Therapy Treatment Patient Details Name: Gail Edwards MRN: [id number] DOB: 04/24/1955 Today's Date: 05/05/2020    History of Present Illness 65 yo female admitted 04/30/20 with garbled speech and L facial droop. TPA given on 3/10 worsening symptoms MR (+) small IVH vs ventriculitis intubation decline in mental status 3/11lumbar punture reveals bacterial mengitis PMH HIV prediabetic ORIF humerus R 2018    PT Comments    Pt was able to walk down the hallway today with min assist and RW.  She remains a bit slow to process despite being fully oriented,  At baseline, she does not use and AD.  UE/LE HEP reviewed at the end of session.  PT will continue to follow acutely for safe mobility progression.   Follow Up Recommendations  CIR     Equipment Recommendations  3in1 (PT)    Recommendations for Other Services       Precautions / Restrictions Precautions Precautions: Fall    Mobility  Bed Mobility               General bed mobility comments: Pt was OOB in the recliner chair.    Transfers Overall transfer level: Needs assistance Equipment used: Rolling walker (2 wheeled)   Sit to Stand: Min assist         General transfer comment: Min assist to help pt power up to her feet, cues for safe hand placement during transitions.  Ambulation/Gait Ambulation/Gait assistance: Min assist Gait Distance (Feet): 85 Feet Assistive device: Rolling walker (2 wheeled) Gait Pattern/deviations: Step-through pattern;Staggering left;Staggering right     General Gait Details: Pt with staggering gait pattern, slow speed, cues for safe RW use.   Stairs             Wheelchair Mobility    Modified Rankin (Stroke Patients Only)       Balance Overall balance assessment: Needs assistance Sitting-balance support: Feet supported;Bilateral upper extremity supported Sitting balance-Leahy Scale: Good     Standing balance support: Bilateral upper extremity  supported Standing balance-Leahy Scale: Poor Standing balance comment: needs external support in standing from AD and therapist.                            Cognition Arousal/Alertness: Awake/alert Behavior During Therapy: WFL for tasks assessed/performed Overall Cognitive Status: Impaired/Different from baseline Area of Impairment: Problem solving                             Problem Solving: Slow processing        Exercises General Exercises - Upper Extremity Shoulder Flexion: AROM;Both;10 reps Elbow Flexion: AROM;Both;10 reps General Exercises - Lower Extremity Ankle Circles/Pumps: AROM;Both;20 reps Long Arc Quad: AROM;Both;10 reps Hip Flexion/Marching: AROM;Both;10 reps    General Comments        Pertinent Vitals/Pain Pain Assessment: No/denies pain    Home Living                      Prior Function            PT Goals (current goals can now be found in the care plan section) Acute Rehab PT Goals Patient Stated Goal: to be able to return home Progress towards PT goals: Progressing toward goals    Frequency    Min 3X/week      PT Plan Current plan remains appropriate    Co-evaluation  AM-PAC PT "6 Clicks" Mobility   Outcome Measure  Help needed turning from your back to your side while in a flat bed without using bedrails?: A Little Help needed moving from lying on your back to sitting on the side of a flat bed without using bedrails?: A Little Help needed moving to and from a bed to a chair (including a wheelchair)?: A Little Help needed standing up from a chair using your arms (e.g., wheelchair or bedside chair)?: A Little Help needed to walk in hospital room?: A Little Help needed climbing 3-5 steps with a railing? : A Little 6 Click Score: 18    End of Session Equipment Utilized During Treatment: Gait belt Activity Tolerance: Patient tolerated treatment well Patient left: in chair;with chair  alarm set;with call bell/phone within reach Nurse Communication: Mobility status PT Visit Diagnosis: Unsteadiness on feet (R26.81);Muscle weakness (generalized) (M62.81);Difficulty in walking, not elsewhere classified (R26.2)     Time: 2500-3704 PT Time Calculation (min) (ACUTE ONLY): 25 min  Charges:  $Gait Training: 8-22 mins $Therapeutic Exercise: 8-22 mins                     Corinna Capra, PT, DPT  Acute Rehabilitation [phone] pager [phone]) [phone] office

## 2020-05-05 NOTE — Telephone Encounter (Signed)
Ok to refill. Thanks 

## 2020-05-05 NOTE — Progress Notes (Signed)
Occupational Therapy Treatment Patient Details Name: Gail Edwards MRN: [id number] DOB: 11/22/1955 Today's Date: 05/05/2020    History of present illness 65 yo female admitted garbled speech and L facial droop. TPA given on 3/10 worsening symptoms MR (+) small IVH vs ventriculitis intubation decline in mental status 3/11lumbar punture reveals bacterial mengitis PMH HIV prediabetic ORIF humerus R 2018   OT comments  Pt with noticeable decrease in size to neck edema on the Rside compared to L this session. Rn made aware as well. Pt progressed to bathroom level care this session with decreased balance requiring RW. Pt independent prior to admission. Recommendation for CIR.   Follow Up Recommendations  CIR    Equipment Recommendations  3 in 1 bedside commode (will need to be bariatric width)    Recommendations for Other Services Rehab consult    Precautions / Restrictions Precautions Precautions: Fall       Mobility Bed Mobility Overal bed mobility: Needs Assistance Bed Mobility: Supine to Sit Rolling: Supervision   Supine to sit: Supervision     General bed mobility comments: pt dependent on bed rail and increased time to sequence task. pt pulling with R UE on bed rail and rocking to long sit EOB    Transfers Overall transfer level: Needs assistance Equipment used: Rolling walker (2 wheeled) Transfers: Sit to/from Stand Sit to Stand: Min assist         General transfer comment: pt with hand placement on RW and cues for hand placement safety    Balance Overall balance assessment: Needs assistance Sitting-balance support: Bilateral upper extremity supported;Feet supported Sitting balance-Leahy Scale: Good     Standing balance support: Bilateral upper extremity supported;During functional activity Standing balance-Leahy Scale: Fair                             ADL either performed or assessed with clinical judgement   ADL Overall ADL's : Needs  assistance/impaired     Grooming: Oral care;Standing;Min guard Grooming Details (indicate cue type and reason): pt holding onto sink at times but able to static stand to complete oral care with widen base of support. pt sequenec task without cues                 Toilet Transfer: Minimal assistance;Regular Toilet;Grab bars Toilet Transfer Details (indicate cue type and reason): pt depend on grab bar for up and down descend to commode. Pt with (A) to elevate from surface and counting to help with momentum up Toileting- Clothing Manipulation and Hygiene: Sitting/lateral lean;Min guard Toileting - Clothing Manipulation Details (indicate cue type and reason): pt completed peri care sitting with lateral lean and able to get toilet paper on L side     Functional mobility during ADLs: Minimal assistance;Rolling walker General ADL Comments: Pt requires (A) to navigate RW initially during session but progressed throughout session with self propelling with turns     Vision       Perception     Praxis      Cognition Arousal/Alertness: Awake/alert Behavior During Therapy: WFL for tasks assessed/performed Overall Cognitive Status: Impaired/Different from baseline                               Problem Solving: Slow processing General Comments: pt with delayed responses at times but more alert and with brisker responses than previous evaluation. pt denies need to void and  voiding bowel/bladder. pt does not appear to be back to baseline cognition wise yet        Exercises     Shoulder Instructions       General Comments VSS, pt reporting feeling very pleased with session    Pertinent Vitals/ Pain       Pain Assessment: No/denies pain  Home Living                                          Prior Functioning/Environment              Frequency  Min 2X/week        Progress Toward Goals  OT Goals(current goals can now be found in the care  plan section)  Progress towards OT goals: Progressing toward goals  Acute Rehab OT Goals Patient Stated Goal: to be able to return home OT Goal Formulation: With patient Time For Goal Achievement: 05/17/20 Potential to Achieve Goals: Good ADL Goals Pt Will Perform Grooming: with set-up;sitting Pt Will Transfer to Toilet: with mod assist;bedside commode;stand pivot transfer Additional ADL Goal #1: Pt will complete bed mobility min (A) as precursor to adls. Additional ADL Goal #2: pt will follow 3 step commands 2 out 3 attempts  Plan Discharge plan remains appropriate    Co-evaluation                 AM-PAC OT "6 Clicks" Daily Activity     Outcome Measure   Help from another person eating meals?: A Little Help from another person taking care of personal grooming?: A Little Help from another person toileting, which includes using toliet, bedpan, or urinal?: A Lot Help from another person bathing (including washing, rinsing, drying)?: A Lot Help from another person to put on and taking off regular upper body clothing?: A Lot Help from another person to put on and taking off regular lower body clothing?: A Lot 6 Click Score: 14    End of Session Equipment Utilized During Treatment: Rolling walker  OT Visit Diagnosis: Unsteadiness on feet (R26.81);Muscle weakness (generalized) (M62.81)   Activity Tolerance Patient tolerated treatment well   Patient Left in chair;with call bell/phone within reach;with chair alarm set;with nursing/sitter in room (RN helping set chair up at the end of session)   Nurse Communication Mobility status;Precautions        Time: 1133-1150 OT Time Calculation (min): 17 min  Charges: OT General Charges $OT Visit: 1 Visit OT Treatments $Self Care/Home Management : 8-22 mins   Brynn, OTR/L  Acute Rehabilitation Services Pager: [phone] Office: [phone] .    Mateo Flow 05/05/2020, 1:48 PM

## 2020-05-05 NOTE — PMR Pre-admission (Shared)
PMR Admission Coordinator Pre-Admission Assessment  Patient: Gail Edwards is an 65 y.o., female MRN: [id number] DOB: 12/15/1955 Height: 5' 6"  (167.6 cm) Weight: 112.7 kg              Insurance Information HMO:    PPO: yes     PCP:      IPA:      80/20:      OTHER:  PRIMARY: [place]      Policy#: [id number] Subscriber: patient CM Name: opened online      Phone#:  Online - [email or web address]  Fax#:  Pre-Cert#: [id number]      Employer: *** Benefits:  Phone #: ***     Name: Irene Shipper Date: 02/23/2020- 02/21/2021 Deductible: $1,000 ($212.19 met) OOP Max: $2,500 ($497.19 met) CIR: 90% coverage, 10% co-insarance SNF: 90% coverage, 10% co-insurance after deductible met, Limited to 120 Days Per Calendar Year combined with [facility]. Outpatient:  90% coverage, 10% co-insurance after deductible met, Limited to 60 Visits Per Calendar Year combined Home Health: 90% coverage, 10% co-insurance after deductible met; Limited to 100 Visits Per Calendar Year. 1 visit equals up to 4 hours of skilled care services. DME: 90% coverage, 10% co-insurance after deductible met   SECONDARY:       Policy#:       Phone#:   Development worker, community:       Phone#:   The Engineer, petroleum" for patients in Inpatient Rehabilitation Facilities with attached "Privacy Act [place] Records" was provided and verbally reviewed with: {CHL IP Patient Family EL:[id number]}  Emergency Contact Information Contact Information    Name Relation Home Work Weed, Carmin [place]   [phone]   NO, CONTACT    [phone]     Current Medical History  Patient Admitting Diagnosis: functional and cognitive deficits d/t bacterial meningitis  History of Present Illness: Pt is a 65 y.o. right-handed female with history of HIV but long-term virologic/immunologic control and prediabetes.  Per chart review patient lives alone.  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  Two-level home with full bath on main level and bedroom upstairs.  She has a son who lives in the area.  Presented 05/01/2020 with acute onset of aphasia as well as left facial droop and headache.  Patient with recent fever and chills.  Her blood pressure was 190/110.  Cranial CT scan negative.  Patient did  receive TPA.  CT angiogram of head and neck no intracranial arterial occlusion or high-grade stenosis.  MRI showed fluid level within the occipital horn of the bilateral lateral ventricles with low signal on T2 and associated restricted diffusion.  Findings suggestive of small intraventricular hemorrhage with the possibility of ventriculitis.  Echocardiogram with ejection fraction of 55 to 60% no wall motion abnormalities.  Admission chemistries unremarkable except glucose 107, WBC 10,700, SARS coronavirus negative, hemoglobin A1c 5.7, ammonia level within normal limits 33, urinalysis positive nitrite.  Patient did require intubation for airway protection and extubated 05/03/2020.  Neurology follow-up as well as infectious disease and LP was performed consistent with acute bacterial meningitis and currently maintained on Emtricitabine-Rilpivir-Tenofovir as well as broad-spectrum antibiotics of ampicillin and vancomycin.. Droplet precautions.  Patient was cleared to begin Lovenox for DVT prophylaxis.  Therapy evaluations completed due to patient's decreased functional mobility physical medicine rehab consult requested.  Complete NIHSS TOTAL: 0 Glasgow Coma Scale Score: 15  Past Medical History  Past Medical History:  Diagnosis Date  .  Abnormal Pap smear 2009   lsil-cin1  . HIV infection ([place])   . Pre-diabetes     Family History  family history includes Cancer in her father; Diabetes in her mother.  Prior Rehab/Hospitalizations:  Has the patient had prior rehab or hospitalizations prior to admission? No  Has the patient had major surgery during 100 days prior to  admission? No  Current Medications   Current Facility-Administered Medications:  .  [COMPLETED] alteplase (ACTIVASE) 1 mg/mL infusion 90 mg, 90 mg, Intravenous, Once, Stopped at 04/30/20 2234 **FOLLOWED BY** 0.9 %  sodium chloride infusion, 50 mL, Intravenous, Once, Kerney Elbe, MD .  0.9 %  sodium chloride infusion, , Intravenous, PRN, Amie Portland, MD, Stopped at 05/04/20 1419 .  acetaminophen (TYLENOL) tablet 650 mg, 650 mg, Oral, Q4H PRN, 650 mg at 05/04/20 1237 **OR** acetaminophen (TYLENOL) 160 MG/5ML solution 650 mg, 650 mg, Per Tube, Q4H PRN, 650 mg at 05/02/20 1750 **OR** acetaminophen (TYLENOL) suppository 650 mg, 650 mg, Rectal, Q4H PRN, Kerney Elbe, MD, 650 mg at 05/01/20 1225 .  ampicillin (OMNIPEN) 2 g in sodium chloride 0.9 % 100 mL IVPB, 2 g, Intravenous, Q4H, Pashayan, Redgie Grayer, MD, Last Rate: 300 mL/hr at 05/05/20 1230, 2 g at 05/05/20 1230 .  calcium carbonate (TUMS - dosed in mg elemental calcium) chewable tablet 200 mg of elemental calcium, 1 tablet, Oral, TID WC, Pokhrel, Laxman, MD, 200 mg of elemental calcium at 05/05/20 1054 .  cefTRIAXone (ROCEPHIN) 2 g in sodium chloride 0.9 % 100 mL IVPB, 2 g, Intravenous, Q12H, Pashayan, Redgie Grayer, MD, Last Rate: 200 mL/hr at 05/05/20 1059, 2 g at 05/05/20 1059 .  Chlorhexidine Gluconate Cloth 2 % PADS 6 each, 6 each, Topical, Daily, Amie Portland, MD, 6 each at 05/04/20 1300 .  emtricitabine-rilpivir-tenofovir AF (ODEFSEY) 200-25-25 MG per tablet 1 tablet, 1 tablet, Oral, Q breakfast, Jacky Kindle, MD, 1 tablet at 05/05/20 0830 .  enoxaparin (LOVENOX) injection 40 mg, 40 mg, Subcutaneous, Daily, Chand, Sudham, MD, 40 mg at 05/05/20 1055 .  hydrALAZINE (APRESOLINE) injection 10-20 mg, 10-20 mg, Intravenous, Q4H PRN, Jacky Kindle, MD, 20 mg at 05/04/20 1708 .  insulin aspart (novoLOG) injection 0-20 Units, 0-20 Units, Subcutaneous, Q4H, Jacky Kindle, MD, 4 Units at 05/05/20 1146 .  labetalol (NORMODYNE) injection 10 mg, 10 mg,  Intravenous, Q2H PRN, Jacky Kindle, MD, 10 mg at 05/04/20 1717 .  losartan (COZAAR) tablet 25 mg, 25 mg, Oral, Daily, Pokhrel, Laxman, MD, 25 mg at 05/05/20 1054 .  multivitamin with minerals tablet 1 tablet, 1 tablet, Oral, Daily, Jacky Kindle, MD, 1 tablet at 05/05/20 1054 .  senna-docusate (Senokot-S) tablet 1 tablet, 1 tablet, Oral, QHS PRN, Tacy Learn, Sudham, MD .  temazepam (RESTORIL) capsule 15 mg, 15 mg, Oral, QHS PRN, Tacy Learn, Sudham, MD .  vancomycin (VANCOREADY) IVPB 1000 mg/200 mL, 1,000 mg, Intravenous, Q12H, Einar Grad, RPH, Last Rate: 200 mL/hr at 05/05/20 1436, 1,000 mg at 05/05/20 1436  Patients Current Diet:  Diet Order            Diet regular Room service appropriate? Yes; Fluid consistency: Thin  Diet effective now                 Precautions / Restrictions Precautions Precautions: Fall Restrictions Weight Bearing Restrictions: No   Has the patient had 2 or more falls or a fall with injury in the past year?No  Prior Activity Level Limited Community (1-2x/wk): 2-3 x/week out of house  Prior Functional Level Prior Function  Level of Independence: Independent Comments: works at [employer] as Chief Strategy Officer from home  [place]: Did the patient need help bathing, dressing, using the toilet or eating?  Independent  Indoor Mobility: Did the patient need assistance with walking from room to room (with or without device)? Independent  Stairs: Did the patient need assistance with internal or external stairs (with or without device)? Independent  Functional Cognition: Did the patient need help planning regular tasks such as shopping or remembering to take medications? Independent  Home Assistive Devices / Equipment Home Equipment: Walker - 2 wheels,Adaptive equipment,Shower seat  Prior Device Use: Indicate devices/aids used by the patient prior to current illness, exacerbation or injury? None of the above  Current Functional Level Cognition  Overall Cognitive  Status: Impaired/Different from baseline Orientation Level: (P) Oriented X4 General Comments: pt with delayed responses at times but more alert and with brisker responses than previous evaluation. pt denies need to void and voiding bowel/bladder. pt does not appear to be back to baseline cognition wise yet    Extremity Assessment (includes Sensation/Coordination)  Upper Extremity Assessment: Generalized weakness  Lower Extremity Assessment: Generalized weakness    ADLs  Overall ADL's : Needs assistance/impaired Eating/Feeding: NPO Eating/Feeding Details (indicate cue type and reason): notified SLP of extubation, notified RN of cancelled SLP consult need for new orders if appropriate Grooming: Oral care,Standing,Min guard Grooming Details (indicate cue type and reason): pt holding onto sink at times but able to static stand to complete oral care with widen base of support. pt sequenec task without cues Upper Body Bathing: Moderate assistance Lower Body Bathing: Maximal assistance Upper Body Dressing : Moderate assistance Lower Body Dressing: Maximal assistance Toilet Transfer: Minimal assistance,Regular Toilet,Grab bars Toilet Transfer Details (indicate cue type and reason): pt depend on grab bar for up and down descend to commode. Pt with (A) to elevate from surface and counting to help with momentum up Toileting- Clothing Manipulation and Hygiene: Sitting/lateral lean,Min guard Toileting - Clothing Manipulation Details (indicate cue type and reason): pt completed peri care sitting with lateral lean and able to get toilet paper on L side Functional mobility during ADLs: Minimal assistance,Rolling walker General ADL Comments: Pt requires (A) to navigate RW initially during session but progressed throughout session with self propelling with turns    Mobility  Overal bed mobility: Needs Assistance Bed Mobility: Supine to Sit Rolling: Supervision Supine to sit: Supervision General bed  mobility comments: pt dependent on bed rail and increased time to sequence task. pt pulling with R UE on bed rail and rocking to long sit EOB    Transfers  Overall transfer level: Needs assistance Equipment used: Rolling walker (2 wheeled) Transfers: Sit to/from Stand Sit to Stand: Min assist Stand pivot transfers: +2 physical assistance,Max assist General transfer comment: pt with hand placement on RW and cues for hand placement safety    Ambulation / Gait / Stairs / Wheelchair Mobility  Ambulation/Gait Ambulation/Gait assistance: Herbalist (Feet): 10 Feet Assistive device: Rolling walker (2 wheeled) Gait Pattern/deviations: Step-through pattern,Decreased stride length General Gait Details: Cues for walker use, sequencing, slow and effortful pace. No overt LOB Gait velocity: decreased Gait velocity interpretation: <1.31 ft/sec, indicative of household ambulator    Posture / Balance Balance Overall balance assessment: Needs assistance Sitting-balance support: Bilateral upper extremity supported,Feet supported Sitting balance-Leahy Scale: Good Standing balance support: Bilateral upper extremity supported,During functional activity Standing balance-Leahy Scale: Fair    Special needs/care consideration Skin Rash: lower abdomen, Diabetic management novoLOG  0-20 units every 4 hours, Droplet precautions and Designated visitor Garner Gavel, son     Previous Environmental health practitioner (from acute therapy documentation) Living Arrangements: Alone Available Help at Discharge: Family,Available PRN/intermittently Type of Home: [place]: Two level,Full bath on main level Alternate Level Stairs-Rails: Right Alternate Level Stairs-Number of Steps: 10 Home Access: Stairs to enter Entrance Stairs-Rails: None Entrance Stairs-Number of Steps: 2 Bathroom Shower/Tub: Multimedia programmer: Handicapped height Bathroom Accessibility: Yes How Accessible: Accessible via  walker Joy: No Additional Comments: son lives in [place] ( [place] goes by [employer])  Discharge Living Setting Plans for Discharge Living Setting: Patient's home Type of Home at Discharge: House Discharge Home Layout: Two level,Full bath on main level Alternate Level Stairs-Rails: Right Alternate Level Stairs-Number of Steps: 10 Discharge Home Access: Stairs to enter Entrance Stairs-Rails: None Entrance Stairs-Number of Steps: 2 Discharge Bathroom Shower/Tub: Walk-in shower Discharge Bathroom Toilet: Handicapped height Discharge Bathroom Accessibility: Yes How Accessible: Accessible via walker Does the patient have any problems obtaining your medications?: No  Social/Family/Support Systems Anticipated Caregiver: Garner Gavel, son Caregiver Availability: Intermittent Discharge Plan Discussed with Primary Caregiver: Yes Is Caregiver In Agreement with Plan?: Yes Does Caregiver/Family have Issues with Lodging/Transportation while Pt is in Rehab?: No   Goals Patient/Family Goal for Rehab: Supervision: PT; Supervision-Min A: OT, Supervision-Mod I: ST Expected length of stay: 12-18 days Pt/Family Agrees to Admission and willing to participate: Yes Program Orientation Provided & Reviewed with Pt/Caregiver Including Roles  & Responsibilities: Yes   Decrease burden of Care through IP rehab admission: NA   Possible need for SNF placement upon discharge: Not anticipated   Patient Condition: {PATIENT'S CONDITION:22832}  Preadmission Screen Completed By:  Bethel Born, CCC-SLP, 05/05/2020 2:42 PM ______________________________________________________________________   Discussed status with Dr. Marland Kitchenon***at *** and received approval for admission today.  Admission Coordinator:  Bethel Born, time***/Date***

## 2020-05-05 NOTE — Progress Notes (Signed)
Nutrition Follow-up  DOCUMENTATION CODES:   Obesity unspecified  INTERVENTION:   Encourage PO intake at meals  Snacks TID between meals    NUTRITION DIAGNOSIS:   Inadequate oral intake related to poor appetite as evidenced by meal completion < 50%. Ongoing.   GOAL:   Patient will meet greater than or equal to 90% of their needs Progressing.   MONITOR:   PO intake,Supplement acceptance  REASON FOR ASSESSMENT:   Consult Enteral/tube feeding initiation and management  ASSESSMENT:   30 YOF admitted for stroke. PMH of HIV, pre-diabetes, cholecystectomy, hernia repair, HTN, HLD.  Pt discussed during ICU rounds and with RN.   3/10 pt dx with metabolic encephalopathy due to acute bacterial meningitis/ventriculitis  3/12 extubated  Per pt she is feeling much better. She reports having lost some weight PTA by trying to eat healthier. She curently reports poor appetite since extubation. Pt states she ate < 50% of her breakfast this am but did like the food she received.  Pt agreeable to snacks until appetite improves.    Medications reviewed and include: SSI, MVI with minerals  Labs reviewed:  CBG's: [id number]   Diet Order:   Diet Order            Diet regular Room service appropriate? Yes; Fluid consistency: Thin  Diet effective now                 EDUCATION NEEDS:   No education needs have been identified at this time  Skin:  Skin Assessment: Reviewed RN Assessment  Last BM:  3/12 large  Height:   Ht Readings from Last 1 Encounters:  05/01/20 5\' 6"  (1.676 m)    Weight:   Wt Readings from Last 1 Encounters:  05/03/20 112.7 kg    Ideal Body Weight:  61.4 kg  BMI:  Body mass index is 40.1 kg/m.  Estimated Nutritional Needs:   Kcal:  1700-1900  Protein:  95-115 grams  Fluid:  >/=1.6 L  Mandee Pluta P., RD, LDN, CNSC See AMiON for contact information

## 2020-05-05 NOTE — Progress Notes (Addendum)
PROGRESS NOTE  Gail Edwards ZLD:[id number] DOB: 23-Apr-1955 DOA: 04/30/2020 PCP: Lorenda Ishihara, MD   LOS: 5 days   Brief narrative: Patient is a 65 years old female with past medical history of HIV presented to hospital on 04/30/2020 with garbled speech and left facial droop.  She was initially treated with TPA for possible stroke.  On 05/02/2019 patient developed worsening confusion.  MRI of the brain was performed which showed a small intraventricular hemorrhage versus possible ventriculitis.  PCCM was consulted due to progressive decline in mental status and patient needing intubation.    Subsequently, ID was consulted.  Patient was given IV antibiotics after lumbar puncture was highly suspected for bacterial meningitis.  Assessment/Plan:  Principal Problem:   Meningitis Active Problems:   HIV disease (HCC)   Abnormal MRI   Cerebral ventriculitis  Acute  metabolic encephalopathy due to acute bacterial meningitis/ventriculitis LP performed was consistent with acute bacterial meningitis.  Patient was initially on acyclovir which was discontinued.  Currently on ampicillin, ceftriaxone and vancomycin.  Continue IV steroid to complete total 4-day course.  Patient has improved at this time.  No headache today.  2D echocardiogram without any vegetations.  CSF culture with with no growth so far.  Toxoplasma IgG positive. Cryptococcal serum antigen negative.  HIV RNA less than 20.  CD4 count 121 on 05/02/2020.  Would follow ID recommendations on antibiotic.  Acute respiratory failure with hypoxia Patient was intubated for worsening mental status.  Currently on room air after extubation.    Hyponatremia resolved.  Sodium of 145 today.  Closely monitor.  Elevated blood pressure.  History of hypertension.  Not on medication as outpatient.  Losartan was initiated yesterday.  HIV Followed by Dr. Ninetta Lights outpatient.  Continue Emtricitabine-Rilpivir-Tenofovir .  ID to follow the patient  during hospitalization.  Prediabetes.  Latest A1c of 5.7.  Not on medications at home.  Morbid obesity Would benefit from ongoing weight loss as outpatient.  Debility, deconditioning.  Patient has been seen by physical therapy recommended CIR.  PMR has been consulted  DVT prophylaxis: enoxaparin (LOVENOX) injection 40 mg Start: 05/03/20 1000 SCDs Start: 05/03/20 0930  Code Status: Full code  Family Communication:  I spoke with the patient's son and updated him about the clinical condition of the patient.  Status is: Inpatient  Remains inpatient appropriate because:IV treatments appropriate due to intensity of illness or inability to take PO, Inpatient level of care appropriate due to severity of illness and IV antibiotics   Dispo: The patient is from: Home              Anticipated d/c is to: Home              Patient currently is not medically stable to d/c.   Difficult to place patient No  Consultants:  PCCM  Infectious disease  Neurology   Procedures:  NG tube placement,  Intubation and mechanical ventilation  Lumbar puncture  Anti-infectives: Marland Kitchen Vancomycin, Rocephin, ampicillin IV  Anti-infectives (From admission, onward)   Start     Dose/Rate Route Frequency Ordered Stop   05/05/20 0200  vancomycin (VANCOREADY) IVPB 1000 mg/200 mL        1,000 mg 200 mL/hr over 60 Minutes Intravenous Every 12 hours 05/04/20 1641     05/04/20 1000  emtricitabine-tenofovir AF (DESCOVY) 200-25 MG per tablet 1 tablet  Status:  Discontinued       "And" Linked Group Details   1 tablet Oral Daily 05/03/20 1254 05/03/20 1305  05/04/20 1000  dolutegravir (TIVICAY) tablet 50 mg  Status:  Discontinued       "And" Linked Group Details   50 mg Per Tube Daily 05/03/20 1254 05/03/20 1305   05/04/20 0800  emtricitabine-rilpivir-tenofovir AF (ODEFSEY) 200-25-25 MG per tablet 1 tablet        1 tablet Oral Daily with breakfast 05/03/20 1305     05/02/20 0200  vancomycin (VANCOREADY) IVPB  750 mg/150 mL  Status:  Discontinued        750 mg 150 mL/hr over 60 Minutes Intravenous Every 12 hours 05/01/20 1248 05/04/20 1641   05/01/20 1430  dolutegravir (TIVICAY) tablet 50 mg  Status:  Discontinued       "And" Linked Group Details   50 mg Per Tube Daily 05/01/20 1337 05/03/20 1254   05/01/20 1430  emtricitabine-tenofovir AF (DESCOVY) 200-25 MG per tablet 1 tablet  Status:  Discontinued       "And" Linked Group Details   1 tablet Per Tube Daily 05/01/20 1337 05/03/20 1254   05/01/20 1400  acyclovir (ZOVIRAX) 775 mg in dextrose 5 % 150 mL IVPB  Status:  Discontinued        10 mg/kg  77.7 kg (Adjusted) 165.5 mL/hr over 60 Minutes Intravenous Every 8 hours 05/01/20 1246 05/02/20 1729   05/01/20 1330  vancomycin (VANCOCIN) 2,500 mg in sodium chloride 0.9 % 500 mL IVPB        2,500 mg 250 mL/hr over 120 Minutes Intravenous  Once 05/01/20 1241 05/01/20 1753   05/01/20 1315  cefTRIAXone (ROCEPHIN) 2 g in sodium chloride 0.9 % 100 mL IVPB        2 g 200 mL/hr over 30 Minutes Intravenous Every 12 hours 05/01/20 1225     05/01/20 1315  ampicillin (OMNIPEN) 2 g in sodium chloride 0.9 % 100 mL IVPB        2 g 300 mL/hr over 20 Minutes Intravenous Every 4 hours 05/01/20 1225     05/01/20 1200  emtricitabine-rilpivir-tenofovir AF (ODEFSEY) 200-25-25 MG per tablet 1 tablet  Status:  Discontinued        1 tablet Oral Daily with breakfast 05/01/20 0054 05/01/20 1337     Subjective: Today, patient was seen and examined at bedside.  Complains of feeling of better today.  Patient denies any nausea vomiting fever or chills but some heartburn..  Denies headache today.  Objective: Vitals:   05/05/20 1000 05/05/20 1100  BP: (!) 147/70 (!) 144/76  Pulse: 79 73  Resp: (!) 26 (!) 24  Temp:    SpO2: 98% 97%    Intake/Output Summary (Last 24 hours) at 05/05/2020 1137 Last data filed at 05/05/2020 0800 Gross per 24 hour  Intake 831.47 ml  Output 1175 ml  Net -343.53 ml   Filed Weights    05/01/20 1200 05/03/20 0300  Weight: 105.3 kg 112.7 kg   Body mass index is 40.1 kg/m.   Physical Exam: General: Obese built, not in obvious distress HENT:   No scleral pallor or icterus noted. Oral mucosa is moist.  Neck supple.  No neck stiffness Chest:  Clear breath sounds.  Diminished breath sounds bilaterally. No crackles or wheezes.  CVS: S1 &S2 heard. No murmur.  Regular rate and rhythm. Abdomen: Soft, nontender, nondistended.  Bowel sounds are heard.   Extremities: No cyanosis, clubbing or edema.  Peripheral pulses are palpable. Psych: Alert, awake and oriented, normal mood CNS:  No cranial nerve deficits.  Power equal in all extremities.  Skin: Warm and dry.  No rashes noted.  Data Review: I have personally reviewed the following laboratory data and studies,  CBC: Recent Labs  Lab 04/30/20 2106 04/30/20 2131 05/01/20 0905 05/01/20 1418 05/02/20 0031 05/05/20 0055  WBC 10.7*  --   --   --  14.6* 8.4  NEUTROABS 9.6*  --   --   --   --   --   HGB 13.0 13.6 13.3 12.2 11.8* 11.6*  HCT 38.5 40.0 39.0 36.0 34.2* 34.6*  MCV 89.5  --   --   --  87.7 88.0  PLT 336  --   --   --  238 310   Basic Metabolic Panel: Recent Labs  Lab 04/30/20 2106 04/30/20 2131 05/01/20 0905 05/01/20 1405 05/01/20 1418 05/01/20 1700 05/02/20 0031 05/02/20 1637 05/03/20 0433 05/04/20 0421  NA 135 136 137  --  135  --  134*  --  139 145  K 4.1 4.1 3.6  --  3.2*  --  3.7  --  3.6 3.5  CL 105 104  --   --   --   --  106  --  111 114*  CO2 20*  --   --   --   --   --  19*  --  20* 22  GLUCOSE 107* 106*  --   --   --   --  237*  --  220* 133*  BUN 14 16  --   --   --   --  13  --  26* 26*  CREATININE 0.88 0.80  --   --   --   --  0.87  --  0.78 0.72  CALCIUM 9.1  --   --   --   --   --  8.6*  --  8.6* 8.5*  MG  --   --   --  1.8  --  1.8 1.9 2.4  --   --   PHOS  --   --   --  1.9*  --  2.8 2.7 1.9*  --  2.7   Liver Function Tests: Recent Labs  Lab 04/30/20 2106 05/02/20 0031  AST  20 14*  ALT 17 15  ALKPHOS 89 70  BILITOT 1.1 0.6  PROT 6.7 6.2*  ALBUMIN 3.9 3.1*   No results for input(s): LIPASE, AMYLASE in the last 168 hours. Recent Labs  Lab 05/01/20 1028  AMMONIA 33   Cardiac Enzymes: No results for input(s): CKTOTAL, CKMB, CKMBINDEX, TROPONINI in the last 168 hours. BNP (last 3 results) No results for input(s): BNP in the last 8760 hours.  ProBNP (last 3 results) No results for input(s): PROBNP in the last 8760 hours.  CBG: Recent Labs  Lab 05/04/20 2004 05/04/20 2339 05/05/20 0323 05/05/20 0750 05/05/20 1100  GLUCAP 179* 152* 147* 144* 156*   Recent Results (from the past 240 hour(s))  SARS CORONAVIRUS 2 (TAT 6-24 HRS) Nasopharyngeal Nasopharyngeal Swab     Status: None   Collection Time: 04/30/20 10:54 PM   Specimen: Nasopharyngeal Swab  Result Value Ref Range Status   SARS Coronavirus 2 NEGATIVE NEGATIVE Final    Comment: (NOTE) SARS-CoV-2 target nucleic acids are NOT DETECTED.  The SARS-CoV-2 RNA is generally detectable in upper and lower respiratory specimens during the acute phase of infection. Negative results do not preclude SARS-CoV-2 infection, do not rule out co-infections with other pathogens, and should not be used as the sole basis for treatment or  other patient management decisions. Negative results must be combined with clinical observations, patient history, and epidemiological information. The expected result is Negative.  Fact Sheet for Patients: HairSlick.no  Fact Sheet for Healthcare Providers: quierodirigir.com  This test is not yet approved or cleared by the [place] FDA and  has been authorized for detection and/or diagnosis of SARS-CoV-2 by FDA under an Emergency Use Authorization (EUA). This EUA will remain  in effect (meaning this test can be used) for the duration of the COVID-19 declaration under Se ction 564(b)(1) of the Act, 21 U.S.C. section  360bbb-3(b)(1), unless the authorization is terminated or revoked sooner.  Performed at [facility], [address], [place], [place] [address]   MRSA PCR Screening     Status: None   Collection Time: 05/01/20  6:38 AM   Specimen: Nasal Mucosa; Nasopharyngeal  Result Value Ref Range Status   MRSA by PCR NEGATIVE NEGATIVE Final    Comment:        The GeneXpert MRSA Assay (FDA approved for NASAL specimens only), is one component of a comprehensive MRSA colonization surveillance program. It is not intended to diagnose MRSA infection nor to guide or monitor treatment for MRSA infections. Performed at [facility], [address], [place], [place] [address]   Culture, Urine     Status: Abnormal   Collection Time: 05/01/20  4:29 PM   Specimen: Urine, Random  Result Value Ref Range Status   Specimen Description URINE, RANDOM  Final   Special Requests   Final    NONE Performed at [facility], [address] [place], [place] [address]    Culture >=100,000 COLONIES/mL KLEBSIELLA PNEUMONIAE (A)  Final   Report Status 05/04/2020 FINAL  Final   Organism ID, Bacteria KLEBSIELLA PNEUMONIAE (A)  Final      Susceptibility   Klebsiella pneumoniae - MIC*    AMPICILLIN >=32 RESISTANT Resistant     CEFAZOLIN <=4 SENSITIVE Sensitive     CEFEPIME <=0.12 SENSITIVE Sensitive     CEFTRIAXONE <=0.25 SENSITIVE Sensitive     CIPROFLOXACIN <=0.25 SENSITIVE Sensitive     GENTAMICIN <=1 SENSITIVE Sensitive     IMIPENEM <=0.25 SENSITIVE Sensitive     NITROFURANTOIN 128 RESISTANT Resistant     TRIMETH/SULFA <=20 SENSITIVE Sensitive     AMPICILLIN/SULBACTAM 8 SENSITIVE Sensitive     PIP/TAZO <=4 SENSITIVE Sensitive     * >=100,000 COLONIES/mL KLEBSIELLA PNEUMONIAE  Culture, blood (routine x 2)     Status: None (Preliminary result)   Collection Time: 05/02/20 12:31 AM   Specimen: BLOOD RIGHT HAND  Result Value Ref Range Status   Specimen Description BLOOD RIGHT HAND  Final    Special Requests   Final    BOTTLES DRAWN AEROBIC AND ANAEROBIC Blood Culture adequate volume   Culture   Final    NO GROWTH 3 DAYS Performed at [facility], [address] [place], [place] [address]    Report Status PENDING  Incomplete  Culture, blood (routine x 2)     Status: None (Preliminary result)   Collection Time: 05/02/20 12:31 AM   Specimen: BLOOD LEFT HAND  Result Value Ref Range Status   Specimen Description BLOOD LEFT HAND  Final   Special Requests   Final    BOTTLES DRAWN AEROBIC AND ANAEROBIC Blood Culture results may not be optimal due to an excessive volume of blood received in culture bottles   Culture   Final  NO GROWTH 3 DAYS Performed at [facility], [address] [place], [place] [address]    Report Status PENDING  Incomplete  Acid Fast Smear (AFB)     Status: None   Collection Time: 05/02/20  1:48 PM   Specimen: CSF; Cerebrospinal Fluid  Result Value Ref Range Status   AFB Specimen Processing Comment  Final    Comment: (NOTE) Straight Inoculation without Smear Performed At: [facility] [address] [place], [place] [id number] Jolene SchimkeNagendra Sanjai MD YQ:[id number]:[phone]    Acid Fast Smear QNSAFB  Final    Comment: (NOTE) Test not performed. AFB Smear not performed due to specimen source (blood) or insufficient specimen.    Source (AFB) CSF  Final    Comment: Performed at [facility], [address] [place], [place] [address]  CSF culture w Stat Gram Stain     Status: None (Preliminary result)   Collection Time: 05/02/20  1:48 PM   Specimen: CSF; Cerebrospinal Fluid  Result Value Ref Range Status   Specimen Description CSF  Final   Special Requests NONE  Final   Gram Stain   Final    WBC PRESENT,BOTH PMN AND MONONUCLEAR NO ORGANISMS SEEN CYTOSPIN SMEAR    Culture   Final    NO GROWTH 3 DAYS Performed at [facility], [address] [place], [place] [address]    Report Status PENDING  Incomplete      Studies: No results found.    Joycelyn DasLaxman Krystie Leiter, MD  [facility] 05/05/2020  If 7PM-7AM, please contact night-coverage

## 2020-05-05 NOTE — Progress Notes (Signed)
[facility]  Date of Admission:  04/30/2020           Reason for visit: Follow up on meningitis  Current antibiotics: Day 5 Ampicillin (05/01/20--present) Day 5 Ceftriaxone (05/01/20--present) Day 5 Vancomycin (05/01/20--present)   Principal Problem:   Meningitis Active Problems:   HIV disease (HCC)   Abnormal MRI   Cerebral ventriculitis   ASSESSMENT & PLAN:    #Meningitis 05/02/20 CSF negative Gram stain, cultures NGTD but obtained after starting empiric antibiotics.  CSF WBC 1780 (87% PMNS), glucose 79, protein 179.  Currently on broad abx coverage plus steroids.  She gives clear history of CSF leak about 1 month ago with clear nasal drainage for approximately 7 days following powerful sneezing episode as etiology to her development of meningitis . Continue same antibiotics pending final CSF cultures with ampicillin, ceftriaxone, and vancomycin . Steroids per primary . Discussed with NSGY re: CSF leak.  Recommended MRI brain with and without contrast for further evaluation (ordered)  #HIV disease Well controlled historically.  VL undetectable this admission and CD4 count low but percentage maintained in the setting of acute illness.  Do not suspect a opportunistic process. . Continue home Odefsey . No need for PCP prophylaxis    SUBJECTIVE:   24 hour events:  No acute events noted.  Patient reports feeling much better.  No fevers, chills.  Headache resolved.  Hoping to be going home soon.    OBJECTIVE:   Blood pressure (!) 101/58, pulse 75, temperature 97.6 F (36.4 C), temperature source Oral, resp. rate 19, height 5\' 6"  (1.676 m), weight 112.7 kg, SpO2 (!) 85 %. Body mass index is 40.1 kg/m.  Physical Exam Constitutional:      General: She is not in acute distress.    Appearance: Normal appearance.  HENT:     Head: Normocephalic and atraumatic.  Pulmonary:     Effort: Pulmonary effort is normal. No respiratory distress.   Skin:    General: Skin is warm and dry.     Coloration: Skin is not jaundiced.     Findings: No rash.  Neurological:     General: No focal deficit present.     Mental Status: She is alert and oriented to person, place, and time.  Psychiatric:        Mood and Affect: Mood normal.        Behavior: Behavior normal.       Lab Results: Lab Results  Component Value Date   WBC 8.4 05/05/2020   HGB 11.6 (L) 05/05/2020   HCT 34.6 (L) 05/05/2020   MCV 88.0 05/05/2020   PLT 310 05/05/2020    Lab Results  Component Value Date   NA 145 05/04/2020   K 3.5 05/04/2020   CO2 22 05/04/2020   GLUCOSE 133 (H) 05/04/2020   BUN 26 (H) 05/04/2020   CREATININE 0.72 05/04/2020   CALCIUM 8.5 (L) 05/04/2020   GFRNONAA >60 05/04/2020   GFRAA >60 12/13/2016    Lab Results  Component Value Date   ALT 15 05/02/2020   AST 14 (L) 05/02/2020   ALKPHOS 70 05/02/2020   BILITOT 0.6 05/02/2020    No results found for: CRP  No results found for: ESRSEDRATE   I have reviewed the micro and lab results in Epic.  Imaging: No results found.   Imaging  independently reviewed in Epic.    07/02/2020 for Infectious Disease [facility] [phone]  pager 05/05/2020, 1:54 PM  I spent greater than 35 minutes with the patient including greater than 50% of time in face to face counsel of the patient and in coordination of their care.

## 2020-05-05 NOTE — Addendum Note (Signed)
Addended by: Valarie Cones on: 05/05/2020 02:45 PM   Modules accepted: Orders

## 2020-05-06 ENCOUNTER — Inpatient Hospital Stay (HOSPITAL_COMMUNITY): Payer: 59

## 2020-05-06 DIAGNOSIS — Z01818 Encounter for other preprocedural examination: Secondary | ICD-10-CM

## 2020-05-06 DIAGNOSIS — Z4659 Encounter for fitting and adjustment of other gastrointestinal appliance and device: Secondary | ICD-10-CM

## 2020-05-06 DIAGNOSIS — G039 Meningitis, unspecified: Secondary | ICD-10-CM

## 2020-05-06 DIAGNOSIS — R9389 Abnormal findings on diagnostic imaging of other specified body structures: Secondary | ICD-10-CM

## 2020-05-06 LAB — GLUCOSE, CAPILLARY
Glucose-Capillary: 100 mg/dL — ABNORMAL HIGH (ref 70–99)
Glucose-Capillary: 95 mg/dL (ref 70–99)
Glucose-Capillary: 92 mg/dL (ref 70–99)
Glucose-Capillary: 108 mg/dL — ABNORMAL HIGH (ref 70–99)
Glucose-Capillary: 105 mg/dL — ABNORMAL HIGH (ref 70–99)
Glucose-Capillary: 97 mg/dL (ref 70–99)

## 2020-05-06 IMAGING — MR MR HEAD WO/W CM
9 of 13 series · 33 of 48 positions shown · IV contrast (gadavist)
Comparison: MRI head [DATE].

CLINICAL DATA: Meningitis. History of recent CSF leak. HIV
positive.

EXAM:
MRI HEAD WITHOUT AND WITH CONTRAST
TECHNIQUE: Multiplanar, multiecho pulse sequences of the brain and surrounding
structures were obtained without and with intravenous contrast.
CONTRAST:  10mL GADAVIST GADOBUTROL 1 MMOL/ML IV SOLN

[Series 3: DWI · axial · 3.0mm · 1.09mm/px · z∈[-37,+95]mm · 8 of 90 slices shown (1 of 4)]
[im 1/90]
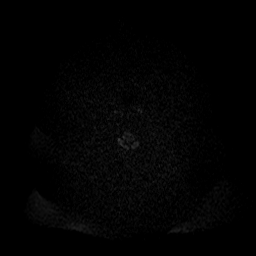
[im 13/90]
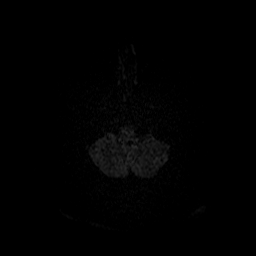
[im 26/90]
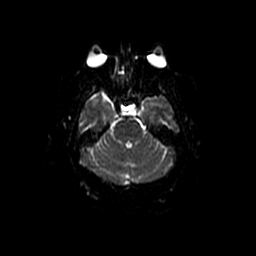
[im 39/90]
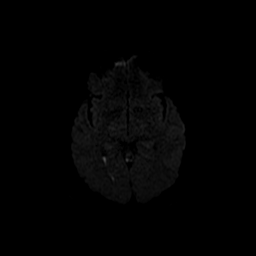
[im 51/90]
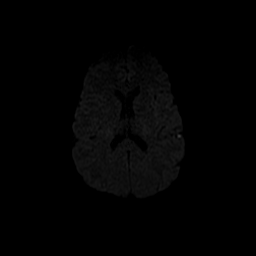
[im 64/90]
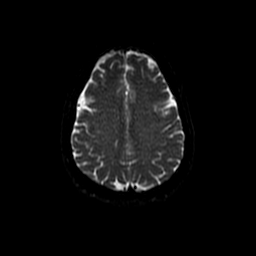
[im 77/90]
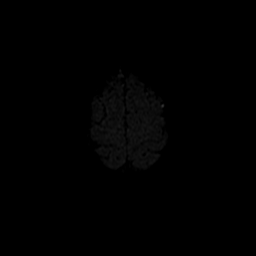
[im 90/90]
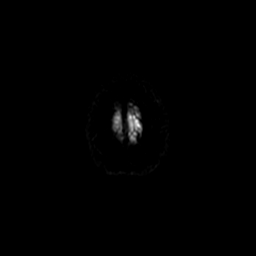

[Series 4: DWI · coronal · 5.0mm · 1.09mm/px · 6 of 66 slices shown (2 of 4)]
[im 1/66]
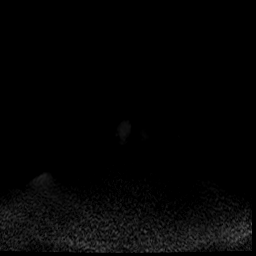
[im 14/66]
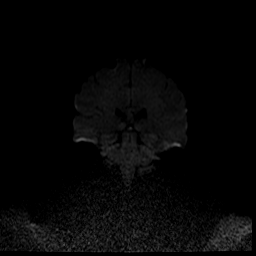
[im 27/66]
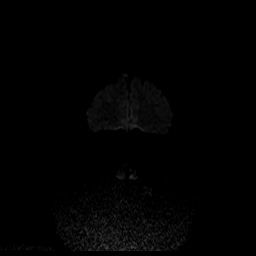
[im 40/66]
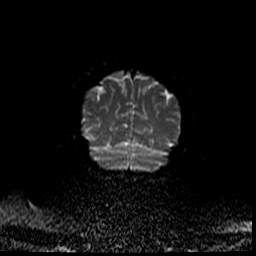
[im 53/66]
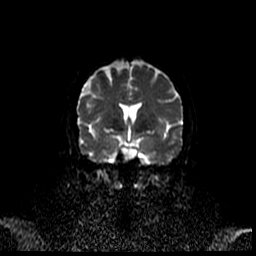
[im 66/66]
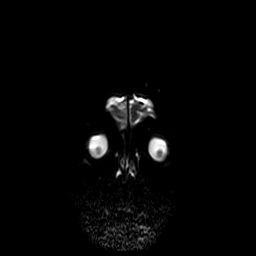

[Series 6: T2 · axial · 5.0mm · 0.45mm/px · z∈[-58,+86]mm · 2 of 25 slices shown]
[im 1/25]
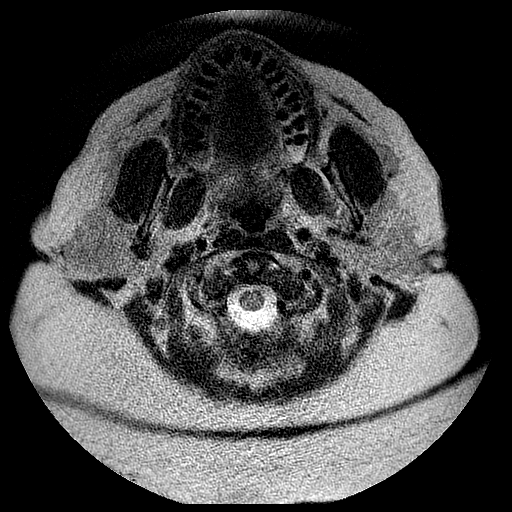
[im 25/25]
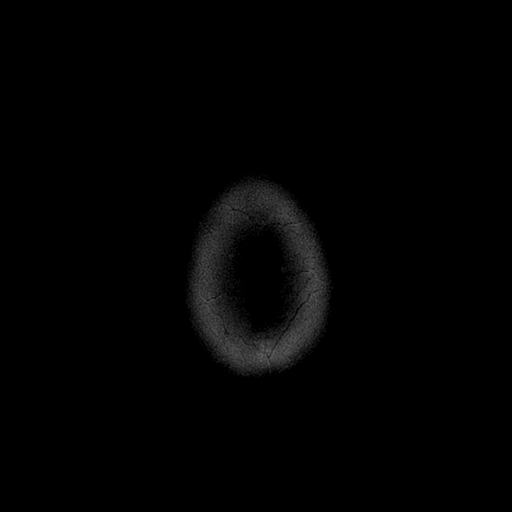

[Series 7: FLAIR · axial · 3.0mm · 0.45mm/px · z∈[-58,+86]mm · 2 of 25 slices shown]
[im 1/25]
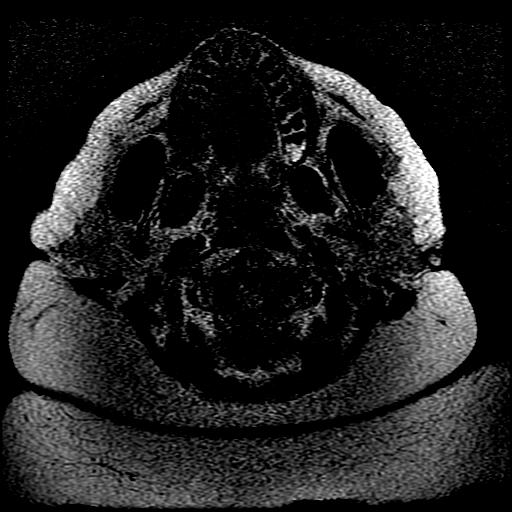
[im 25/25]
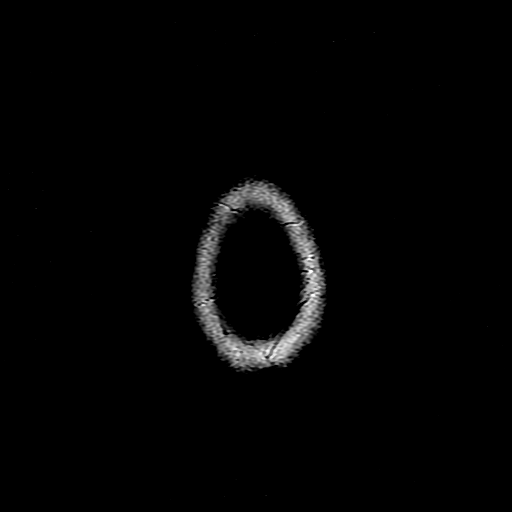

[Series 11: T1 post-contrast · axial · 3.0mm · 0.47mm/px · z∈[-46,+101]mm · 4 of 50 slices shown (1 of 3)]
[im 1/50]
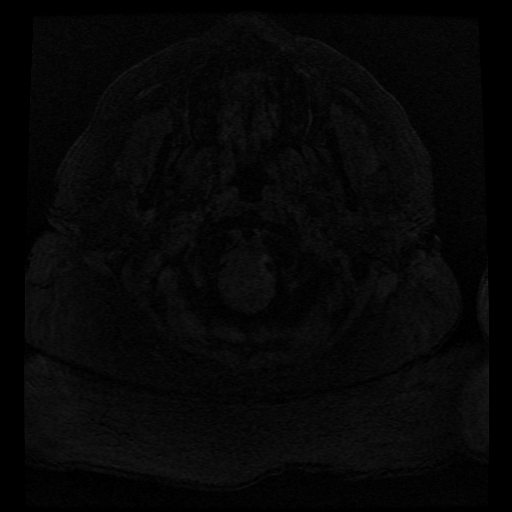
[im 17/50]
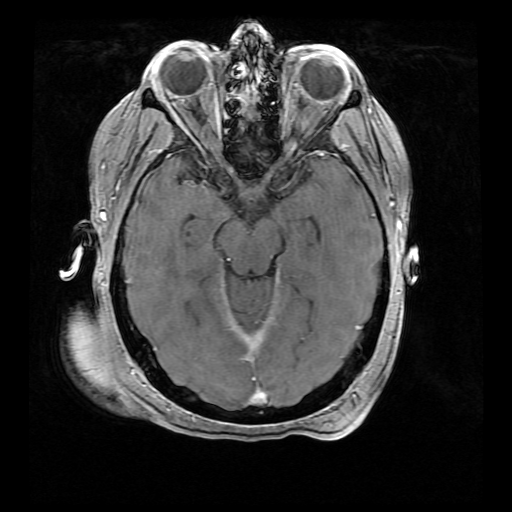
[im 33/50]
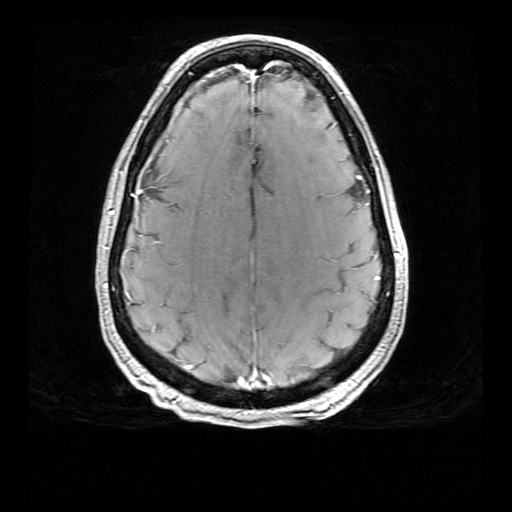
[im 50/50]
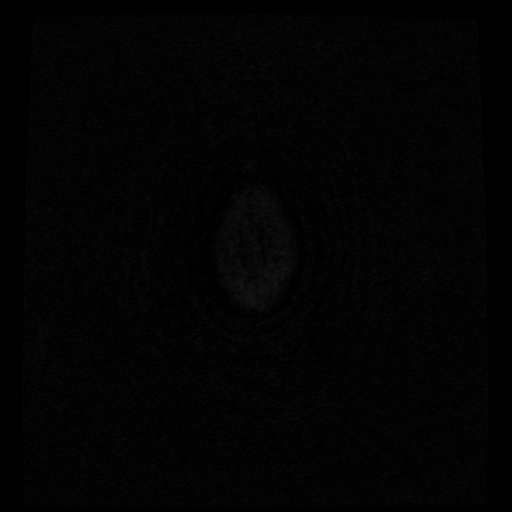

[Series 12: T1 post-contrast · coronal · 5.0mm · 0.39mm/px · 2 of 25 slices shown (2 of 3)]
[im 1/25]
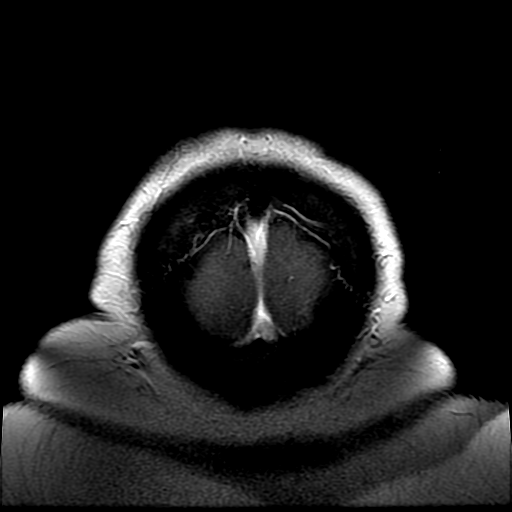
[im 25/25]
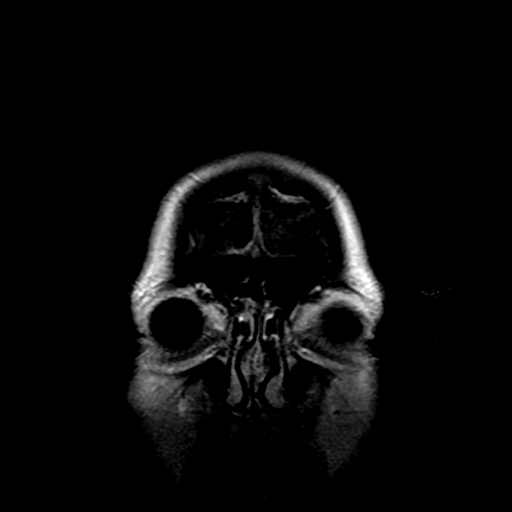

[Series 13: T1 post-contrast · sagittal · 5.0mm · 0.47mm/px · 2 of 23 slices shown (3 of 3)]
[im 1/23]
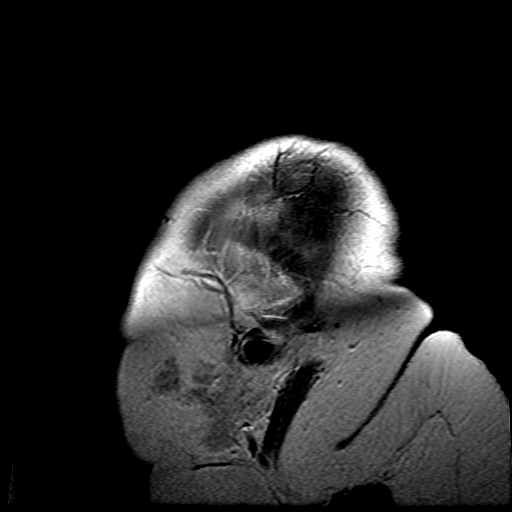
[im 23/23]
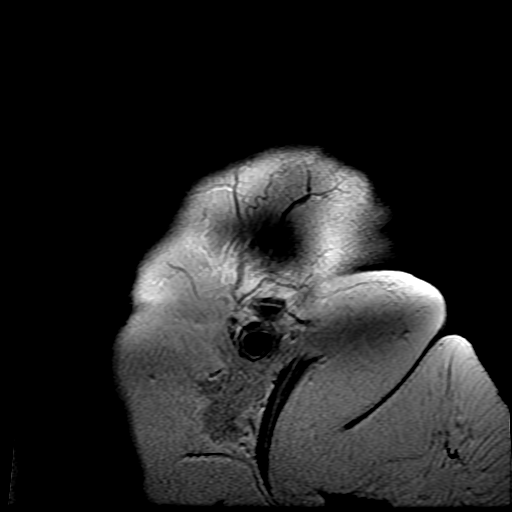

[Series 300: DWI · axial · 3.0mm · 1.09mm/px · z∈[-37,+95]mm · 4 of 45 slices shown (3 of 4)]
[im 1/45]
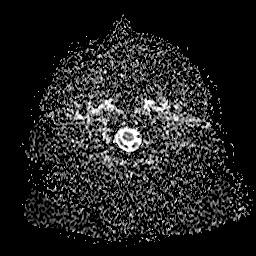
[im 15/45]
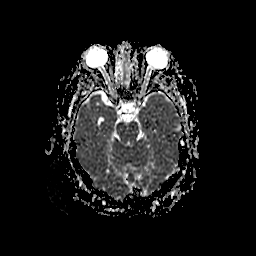
[im 30/45]
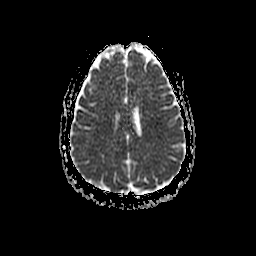
[im 45/45]
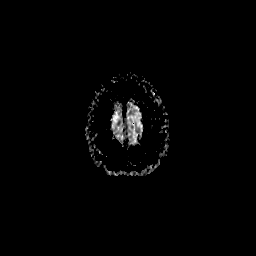

[Series 400: DWI · coronal · 5.0mm · 1.09mm/px · 3 of 33 slices shown (4 of 4)]
[im 1/33]
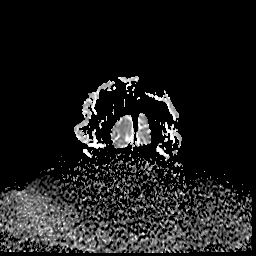
[im 17/33]
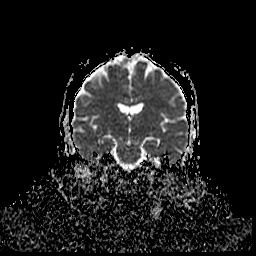
[im 33/33]
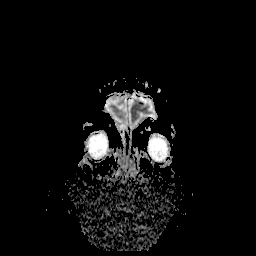

[33 of 48 positions shown; findings below may reference images not displayed]

FINDINGS: Brain: Multiple small areas of restricted diffusion in the
subarachnoid space over the convexity bilaterally have developed
since the prior study. Restricted diffusion in the occipital horns
bilaterally also has progressed. These findings are most consistent
with meningitis.

Small areas of restricted diffusion in the right frontal white
matter, left putamen, left frontal white matter, right frontal
cortex likely are due to small areas of acute infarct. No large
territory acute infarct. There is mild meningeal enhancement on the
right which is likely related to meningitis. No abscess or subdural
fluid collection identified.

Empty sella.  The sella is enlarged with small pituitary.

Negative for hydrocephalus. Negative for intracranial hemorrhage or
mass lesion.

Vascular: Normal arterial flow voids.

Skull and upper cervical spine: No focal skeletal lesion.

Sinuses/Orbits: Mucosal edema in the paranasal sinuses. Bilateral
mastoid effusion.

Other: None
IMPRESSION: Interval development of small areas of restricted diffusion in the
subarachnoid space over the convexity bilaterally compatible with
meningitis. Progressive restricted diffusion in the occipital horns
bilaterally compatible with meningitis. No hydrocephalus or abscess.

Small areas of acute cerebral infarct are present bilaterally.

Mild meningeal enhancement on the right likely related to
meningitis.

Empty sella.  This could be a source of the CSF leak.

## 2020-05-06 MED ORDER — GADOBUTROL 1 MMOL/ML IV SOLN
10.0000 mL | Freq: Once | INTRAVENOUS | Status: AC | PRN
  Administered 2020-05-06: 10 mL via INTRAVENOUS

## 2020-05-06 NOTE — H&P (Incomplete)
Physical Medicine and Rehabilitation Admission H&P     HPI: Gail Edwards. Gail Edwards is a 65 year old right-handed female with history of HIV long-term virologic/immunologic control followed by infectious disease, prediabetes.  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  Two-level home with full bath on main level 1 bedroom upstairs.  She has a son who lives in the area.  Presented 05/01/2020 with acute onset of aphasia as well as left facial droop and headache.  Patient with recent fever and chills.  Her blood pressure was 190/110.  Cranial CT scan negative.  Patient did receive TPA.  CT angiogram of head and neck no intracranial arterial occlusion or high-grade stenosis.  MRI showed fluid level within the occipital horn of the bilateral lateral ventricles with low signal on T2 and associated restricted diffusion.  Findings suggestive of small intraventricular hemorrhage with the possibility of ventriculitis.  Echocardiogram with ejection fraction of 55 to 60% no wall motion abnormalities and no vegetation identified.  Admission chemistries unremarkable aside glucose 107 WBC 10,700, CD4 count 121, SARS coronavirus negative, hemoglobin A1c 5.7 ammonia level within normal limits at 33 urinalysis positive nitrite.  Patient did require intubation for airway protection extubated 05/03/2020.  Neurology follow-up as well as infectious disease and LP was performed consistent with acute bacterial meningitis and initially maintained on ampicillin /Rocephin /vancomycin and simplified to ceftriaxone alone through 05/14/2020.  Follow-up MRI 05/06/2020 reviewed by neurosurgery again showing small area of restricted diffusion in the subarachnoid space over the convexity bilaterally compatible meningitis.  No hydrocephalus or abscess.  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  Placed on Lovenox for DVT prophylaxis.Tolerating a regular diet.Emtricitabine ongoing  for her HIV followed by Dr. Ninetta Lights.  Therapy evaluations completed due to patient's decreased functional ability left facial droop and slurred speech was admitted for a comprehensive rehab program.  Review of Systems  Constitutional: Positive for chills, fever and malaise/fatigue.  HENT: Negative for hearing loss.   Eyes: Negative for blurred vision and double vision.  Respiratory: Positive for shortness of breath. Negative for cough.   Cardiovascular: Positive for leg swelling. Negative for chest pain and palpitations.  Gastrointestinal: Positive for constipation. Negative for heartburn, nausea and vomiting.  Genitourinary: Negative for dysuria, flank pain and hematuria.  Musculoskeletal: Positive for joint pain and myalgias.  Skin: Negative for rash.  Neurological: Positive for dizziness, speech change, weakness and headaches.  All other systems reviewed and are negative.  Past Medical History:  Diagnosis Date  . Abnormal Pap smear 2009   [id number]  . HIV infection (HCC)   . Pre-diabetes    Past Surgical History:  Procedure Laterality Date  . CESAREAN SECTION    . CHOLECYSTECTOMY    . HERNIA REPAIR    . ORIF HUMERUS FRACTURE Right 12/17/2016   Procedure: OPEN REDUCTION INTERNAL FIXATION (ORIF) RIGHT PROXIMAL HUMERUS FRACTURE;  Surgeon: Sheral Apley, MD;  Location: [facility];  Service: Orthopedics;  Laterality: Right;   Family History  Problem Relation Age of Onset  . Diabetes Mother   . Cancer Father        brain  . Breast cancer Neg Hx    Social History:  reports that she has never smoked. She has never used smokeless tobacco. She reports that she does not drink alcohol and does not use drugs. Allergies:  Allergies  Allergen Reactions  . Codeine Other (See Comments)    Headache   Medications  Prior to Admission  Medication Sig Dispense Refill  . Cholecalciferol (VITAMIN D3) 10 MCG (400 UNIT) tablet Take 400 Units by mouth daily.    . clobetasol  ointment (TEMOVATE) 0.05 % APPLY 1 APPLICATION  TOPICALLY 2  TIMES DAILY 90 g 5  . emtricitabine-rilpivir-tenofovir AF (ODEFSEY) 200-25-25 MG TABS tablet Take 1 tablet by mouth daily with breakfast. 90 tablet 5    Drug Regimen Review Drug regimen was reviewed and remains appropriate with no significant issues identified  Home: Home Living Family/patient expects to be discharged to:: Private residence Living Arrangements: Alone Available Help at Discharge: Family,Available PRN/intermittently Type of Home: House Home Access: Stairs to enter [employer] of Steps: 2 Entrance Stairs-Rails: None Home Layout: Two level,Full bath on main level Alternate Level Stairs-Number of Steps: 10 Alternate Level Stairs-Rails: Right Bathroom Shower/Tub: Health visitor: Handicapped height Bathroom Accessibility: Yes Home Equipment: Environmental consultant - 2 wheels,Adaptive equipment,Shower Engineering geologist: Educational psychologist Comments: son lives in [place] ( [place] [name])   Functional History: Prior Function Level of Independence: Independent Comments: 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 Transfers Overall transfer level: Needs assistance Equipment used: Rolling walker (2 wheeled) Transfers: Sit to/from Stand Sit to Stand: Min assist Stand pivot transfers: +2 physical assistance,Max assist General transfer comment: Min assist to help pt power up to her feet, cues for safe hand placement during transitions. Ambulation/Gait Ambulation/Gait assistance: Min assist Gait Distance (Feet): 85 Feet Assistive device: Rolling walker (2 wheeled) Gait Pattern/deviations: Step-through pattern,Staggering left,Staggering right General Gait Details: Pt  with staggering gait pattern, slow speed, cues for safe RW use. Gait velocity: decreased Gait velocity interpretation: <1.31 ft/sec, indicative of household ambulator    ADL: ADL Overall ADL's : Needs assistance/impaired Eating/Feeding: NPO Eating/Feeding Details (indicate cue type and reason): notified SLP of extubation, notified RN of cancelled SLP consult need for new orders if appropriate Grooming: Oral care,Standing,Min guard Grooming Details (indicate cue type and reason): pt holding onto sink at times but able to static stand to complete oral care with widen base of support. pt sequenec task without cues Upper Body Bathing: Moderate assistance Lower Body Bathing: Maximal assistance Upper Body Dressing : Moderate assistance Lower Body Dressing: Maximal assistance Toilet Transfer: Minimal assistance,Regular Toilet,Grab bars Toilet Transfer Details (indicate cue type and reason): pt depend on grab bar for up and down descend to commode. Pt with (A) to elevate from surface and counting to help with momentum up Toileting- Clothing Manipulation and Hygiene: Sitting/lateral lean,Min guard Toileting - Clothing Manipulation Details (indicate cue type and reason): pt completed peri care sitting with lateral lean and able to get toilet paper on L side Functional mobility during ADLs: Minimal assistance,Rolling walker General ADL Comments: Pt requires (A) to navigate RW initially during session but progressed throughout session with self propelling with turns  Cognition: Cognition Overall Cognitive Status: Impaired/Different from baseline Orientation Level: Oriented X4 Cognition Arousal/Alertness: Awake/alert Behavior During Therapy: WFL for tasks assessed/performed Overall Cognitive Status: Impaired/Different from baseline Area of Impairment: Problem solving Orientation Level: Disoriented to,Time Problem Solving: Slow processing General Comments: pt with delayed responses at times but  more alert and with brisker responses than previous evaluation. pt denies need to void and voiding bowel/bladder. pt does not appear to be back to baseline cognition wise  yet  Physical Exam: Blood pressure 137/67, pulse 83, temperature 98.6 F (37 C), temperature source Oral, resp. rate 18, height 5\' 6"  (1.676 m), weight 114.9 kg, SpO2 99 %. Physical Exam Neurological:     Comments: Patient is awake alert no acute distress.  Makes eye contact with examiner and follows simple commands.  Provides her name age and date of birth.  She did have some difficulty recalling her full hospital stay.     Results for orders placed or performed during the hospital encounter of 04/30/20 (from the past 48 hour(s))  Glucose, capillary     Status: Abnormal   Collection Time: 05/04/20  7:34 AM  Result Value Ref Range   Glucose-Capillary 135 (H) 70 - 99 mg/dL    Comment: Glucose reference range applies only to samples taken after fasting for at least 8 hours.  Glucose, capillary     Status: Abnormal   Collection Time: 05/04/20 11:33 AM  Result Value Ref Range   Glucose-Capillary 180 (H) 70 - 99 mg/dL    Comment: Glucose reference range applies only to samples taken after fasting for at least 8 hours.  Vancomycin, trough     Status: Abnormal   Collection Time: 05/04/20  2:09 PM  Result Value Ref Range   Vancomycin Tr 13 (L) 15 - 20 ug/mL    Comment: Performed at [facility], [address] [place], [place]  Glucose, capillary     Status: Abnormal   Collection Time: 05/04/20  3:32 PM  Result Value Ref Range   Glucose-Capillary 152 (H) 70 - 99 mg/dL    Comment: Glucose reference range applies only to samples taken after fasting for at least 8 hours.  Glucose, capillary     Status: Abnormal   Collection Time: 05/04/20  8:04 PM  Result Value Ref Range   Glucose-Capillary 179 (H) 70 - 99 mg/dL    Comment: Glucose reference range applies only to samples taken after fasting for at least 8  hours.  Glucose, capillary     Status: Abnormal   Collection Time: 05/04/20 11:39 PM  Result Value Ref Range   Glucose-Capillary 152 (H) 70 - 99 mg/dL    Comment: Glucose reference range applies only to samples taken after fasting for at least 8 hours.  CBC     Status: Abnormal   Collection Time: 05/05/20 12:55 AM  Result Value Ref Range   WBC 8.4 4.0 - 10.5 K/uL   RBC 3.93 3.87 - 5.11 MIL/uL   Hemoglobin 11.6 (L) 12.0 - 15.0 g/dL   HCT 05/07/20 (L) 31.4 - 97.0 %   MCV 88.0 80.0 - 100.0 fL   MCH 29.5 26.0 - 34.0 pg   MCHC 33.5 30.0 - 36.0 g/dL   RDW 26.3 78.5 - 88.5 %   Platelets 310 150 - 400 K/uL   nRBC 0.0 0.0 - 0.2 %    Comment: Performed at [facility], [address], [place], [place]  Glucose, capillary     Status: Abnormal   Collection Time: 05/05/20  3:23 AM  Result Value Ref Range   Glucose-Capillary 147 (H) 70 - 99 mg/dL    Comment: Glucose reference range applies only to samples taken after fasting for at least 8 hours.  Glucose, capillary     Status: Abnormal   Collection Time: 05/05/20  7:50 AM  Result Value Ref Range   Glucose-Capillary 144 (H) 70 - 99 mg/dL    Comment: Glucose  reference range applies only to samples taken after fasting for at least 8 hours.  Glucose, capillary     Status: Abnormal   Collection Time: 05/05/20 11:00 AM  Result Value Ref Range   Glucose-Capillary 156 (H) 70 - 99 mg/dL    Comment: Glucose reference range applies only to samples taken after fasting for at least 8 hours.  Glucose, capillary     Status: Abnormal   Collection Time: 05/05/20  3:12 PM  Result Value Ref Range   Glucose-Capillary 172 (H) 70 - 99 mg/dL    Comment: Glucose reference range applies only to samples taken after fasting for at least 8 hours.  Glucose, capillary     Status: Abnormal   Collection Time: 05/05/20  7:39 PM  Result Value Ref Range   Glucose-Capillary 144 (H) 70 - 99 mg/dL    Comment: Glucose reference range applies only to samples  taken after fasting for at least 8 hours.  Glucose, capillary     Status: None   Collection Time: 05/05/20 11:21 PM  Result Value Ref Range   Glucose-Capillary 96 70 - 99 mg/dL    Comment: Glucose reference range applies only to samples taken after fasting for at least 8 hours.  Glucose, capillary     Status: Abnormal   Collection Time: 05/06/20  3:46 AM  Result Value Ref Range   Glucose-Capillary 100 (H) 70 - 99 mg/dL    Comment: Glucose reference range applies only to samples taken after fasting for at least 8 hours.   No results found.     Medical Problem List and Plan: 1.  Left facial droop with slurred speech and cognitive deficits secondary to bacterial meningitis/acute metabolic encephalopathy  -patient may *** shower  -ELOS/Goals: *** 2.  Antithrombotics: -DVT/anticoagulation: Lovenox  -antiplatelet therapy: N/A 3. Pain Management: Tylenol as needed 4. Mood: Restoril as needed for sleep  -antipsychotic agents: N/A 5. Neuropsych: This patient is capable of making decisions on her own behalf. 6. Skin/Wound Care: Routine skin checks 7. Fluids/Electrolytes/Nutrition: Routine in and outs with follow-up chemistries 8.  ID/bacterial meningitis.  Continue ceftriaxone per infectious disease through 05/14/2020.   9.  HIV.  Follow-up per Dr. Ninetta LightsHatcher.  ContinueEmtricitabine 10.  Hypertension.  Cozaar 25 mg daily.  Monitor with increased mobility 11.  Prediabetes.  Hemoglobin A1c 5.7.  SSI.  ***  Mcarthur RossettiDaniel J Angiulli, PA-C 05/06/2020

## 2020-05-06 NOTE — Progress Notes (Signed)
PROGRESS NOTE  Gail Edwards YME:[id number] DOB: 12-28-1955 DOA: 04/30/2020 PCP: Lorenda Ishihara, MD   LOS: 6 days   Brief narrative: Patient is a 65 years old female with past medical history of HIV presented to hospital on 04/30/2020 with garbled speech and left facial droop.  She was initially treated with TPA for possible stroke.  On 05/02/2019 patient developed worsening confusion.  MRI of the brain was performed which showed a small intraventricular hemorrhage versus possible ventriculitis.  PCCM was consulted due to progressive decline in mental status and patient needing intubation.    Subsequently, ID was consulted.  Patient was given IV antibiotics after lumbar puncture was highly suspected for bacterial meningitis.  Symptoms have significantly improved at this time.  Assessment/Plan:  Principal Problem:   Meningitis Active Problems:   HIV disease (HCC)   Abnormal MRI   Cerebral ventriculitis  Acute  metabolic encephalopathy due to acute bacterial meningitis/ventriculitis LP performed was consistent with acute bacterial meningitis.  Patient was initially on acyclovir which was discontinued.  Currently on ampicillin, ceftriaxone and vancomycin.  ID today recommended to discontinue ampicillin and vancomycin.  ID recommends ceftriaxone through 3/23.  Will need PICC line placement for antibiotics..  Received IV steroids for bacterial meningitis.   2D echocardiogram without any vegetations.  CSF culture with with no growth so far.  Toxoplasma IgG positive. Cryptococcal serum antigen negative.  HIV RNA less than 20.  CD4 count 121 on 05/02/2020.  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  ID to discuss this with neurosurgery.  Acute respiratory failure with hypoxia Patient was intubated for worsening mental status.  Currently on room air after extubation.    Hyponatremia resolved.  Sodium of 145 today.  Closely monitor.  Elevated blood pressure.  History of hypertension.  Not on  medication as outpatient.  Losartan was initiated place and with overall improved blood pressure.  We will continue to monitor and adjust medications as necessary.  HIV Followed by Dr. Ninetta Lights outpatient.  Continue Emtricitabine-Rilpivir-Tenofovir .  Infectious disease on board.  We will continue current medication.  Prediabetes.  Latest A1c of 5.7.  Not on medications at home.  Morbid obesity Would benefit from ongoing weight loss as outpatient.  Debility, deconditioning.  Patient has been seen by physical therapy recommended CIR.  PMR has been consulted  DVT prophylaxis: enoxaparin (LOVENOX) injection 40 mg Start: 05/03/20 1000 SCDs Start: 05/03/20 0930  Code Status: Full code  Family Communication:  None today.  I spoke with the patient's son and updated him about the clinical condition of the patient yesterday  Status is: Inpatient  Remains inpatient appropriate because:IV treatments appropriate due to intensity of illness or inability to take PO, Inpatient level of care appropriate due to severity of illness and IV antibiotics   Dispo: The patient is from: Home              Anticipated d/c is to: Home              Patient currently is not medically stable to d/c.   Difficult to place patient No  Consultants:  PCCM  Infectious disease  Neurology   Procedures:  NG tube placement,  Intubation and mechanical ventilation  Lumbar puncture  Anti-infectives: Marland Kitchen Rocephin IV  Anti-infectives (From admission, onward)   Start     Dose/Rate Route Frequency Ordered Stop   05/05/20 0200  vancomycin (VANCOREADY) IVPB 1000 mg/200 mL  Status:  Discontinued  1,000 mg 200 mL/hr over 60 Minutes Intravenous Every 12 hours 05/04/20 1641 05/06/20 1053   05/04/20 1000  emtricitabine-tenofovir AF (DESCOVY) 200-25 MG per tablet 1 tablet  Status:  Discontinued       "And" Linked Group Details   1 tablet Oral Daily 05/03/20 1254 05/03/20 1305   05/04/20 1000  dolutegravir  (TIVICAY) tablet 50 mg  Status:  Discontinued       "And" Linked Group Details   50 mg Per Tube Daily 05/03/20 1254 05/03/20 1305   05/04/20 0800  emtricitabine-rilpivir-tenofovir AF (ODEFSEY) 200-25-25 MG per tablet 1 tablet        1 tablet Oral Daily with breakfast 05/03/20 1305     05/02/20 0200  vancomycin (VANCOREADY) IVPB 750 mg/150 mL  Status:  Discontinued        750 mg 150 mL/hr over 60 Minutes Intravenous Every 12 hours 05/01/20 1248 05/04/20 1641   05/01/20 1430  dolutegravir (TIVICAY) tablet 50 mg  Status:  Discontinued       "And" Linked Group Details   50 mg Per Tube Daily 05/01/20 1337 05/03/20 1254   05/01/20 1430  emtricitabine-tenofovir AF (DESCOVY) 200-25 MG per tablet 1 tablet  Status:  Discontinued       "And" Linked Group Details   1 tablet Per Tube Daily 05/01/20 1337 05/03/20 1254   05/01/20 1400  acyclovir (ZOVIRAX) 775 mg in dextrose 5 % 150 mL IVPB  Status:  Discontinued        10 mg/kg  77.7 kg (Adjusted) 165.5 mL/hr over 60 Minutes Intravenous Every 8 hours 05/01/20 1246 05/02/20 1729   05/01/20 1330  vancomycin (VANCOCIN) 2,500 mg in sodium chloride 0.9 % 500 mL IVPB        2,500 mg 250 mL/hr over 120 Minutes Intravenous  Once 05/01/20 1241 05/01/20 1753   05/01/20 1315  cefTRIAXone (ROCEPHIN) 2 g in sodium chloride 0.9 % 100 mL IVPB        2 g 200 mL/hr over 30 Minutes Intravenous Every 12 hours 05/01/20 1225     05/01/20 1315  ampicillin (OMNIPEN) 2 g in sodium chloride 0.9 % 100 mL IVPB  Status:  Discontinued        2 g 300 mL/hr over 20 Minutes Intravenous Every 4 hours 05/01/20 1225 05/06/20 1053   05/01/20 1200  emtricitabine-rilpivir-tenofovir AF (ODEFSEY) 200-25-25 MG per tablet 1 tablet  Status:  Discontinued        1 tablet Oral Daily with breakfast 05/01/20 0054 05/01/20 1337     Subjective: Today, patient was seen and examined at bedside.  Patient denies any nausea vomiting fever headache chills rigors.  Wishes to take  shower.  Objective: Vitals:   05/06/20 0725 05/06/20 1228  BP: 138/66 (!) 156/79  Pulse: 67 65  Resp: 18 18  Temp: 97.9 F (36.6 C) 98 F (36.7 C)  SpO2: 99% 100%    Intake/Output Summary (Last 24 hours) at 05/06/2020 1402 Last data filed at 05/06/2020 0251 Gross per 24 hour  Intake 200 ml  Output 500 ml  Net -300 ml   Filed Weights   05/01/20 1200 05/03/20 0300 05/06/20 0458  Weight: 105.3 kg 112.7 kg 114.9 kg   Body mass index is 40.89 kg/m.   Physical Exam: General: Morbidly obese,, not in obvious distress HENT:   No scleral pallor or icterus noted. Oral mucosa is moist.  Neck supple Chest:  Clear breath sounds.  Diminished breath sounds bilaterally. No crackles or wheezes.  CVS: S1 &S2 heard. No murmur.  Regular rate and rhythm. Abdomen: Soft, nontender, nondistended.  Bowel sounds are heard.   Extremities: No cyanosis, clubbing or edema.  Peripheral pulses are palpable. Psych: Alert, awake and oriented, normal mood CNS:  No cranial nerve deficits.  Power equal in all extremities.   Skin: Warm and dry.  No rashes noted.   Data Review: I have personally reviewed the following laboratory data and studies,  CBC: Recent Labs  Lab 04/30/20 2106 04/30/20 2131 05/01/20 0905 05/01/20 1418 05/02/20 0031 05/05/20 0055  WBC 10.7*  --   --   --  14.6* 8.4  NEUTROABS 9.6*  --   --   --   --   --   HGB 13.0 13.6 13.3 12.2 11.8* 11.6*  HCT 38.5 40.0 39.0 36.0 34.2* 34.6*  MCV 89.5  --   --   --  87.7 88.0  PLT 336  --   --   --  238 310   Basic Metabolic Panel: Recent Labs  Lab 04/30/20 2106 04/30/20 2131 05/01/20 0905 05/01/20 1405 05/01/20 1418 05/01/20 1700 05/02/20 0031 05/02/20 1637 05/03/20 0433 05/04/20 0421  NA 135 136 137  --  135  --  134*  --  139 145  K 4.1 4.1 3.6  --  3.2*  --  3.7  --  3.6 3.5  CL 105 104  --   --   --   --  106  --  111 114*  CO2 20*  --   --   --   --   --  19*  --  20* 22  GLUCOSE 107* 106*  --   --   --   --  237*  --   220* 133*  BUN 14 16  --   --   --   --  13  --  26* 26*  CREATININE 0.88 0.80  --   --   --   --  0.87  --  0.78 0.72  CALCIUM 9.1  --   --   --   --   --  8.6*  --  8.6* 8.5*  MG  --   --   --  1.8  --  1.8 1.9 2.4  --   --   PHOS  --   --   --  1.9*  --  2.8 2.7 1.9*  --  2.7   Liver Function Tests: Recent Labs  Lab 04/30/20 2106 05/02/20 0031  AST 20 14*  ALT 17 15  ALKPHOS 89 70  BILITOT 1.1 0.6  PROT 6.7 6.2*  ALBUMIN 3.9 3.1*   No results for input(s): LIPASE, AMYLASE in the last 168 hours. Recent Labs  Lab 05/01/20 1028  AMMONIA 33   Cardiac Enzymes: No results for input(s): CKTOTAL, CKMB, CKMBINDEX, TROPONINI in the last 168 hours. BNP (last 3 results) No results for input(s): BNP in the last 8760 hours.  ProBNP (last 3 results) No results for input(s): PROBNP in the last 8760 hours.  CBG: Recent Labs  Lab 05/05/20 1939 05/05/20 2321 05/06/20 0346 05/06/20 0749 05/06/20 1225  GLUCAP 144* 96 100* 95 92   Recent Results (from the past 240 hour(s))  SARS CORONAVIRUS 2 (TAT 6-24 HRS) Nasopharyngeal Nasopharyngeal Swab     Status: None   Collection Time: 04/30/20 10:54 PM   Specimen: Nasopharyngeal Swab  Result Value Ref Range Status   SARS Coronavirus 2 NEGATIVE NEGATIVE Final  Comment: (NOTE) SARS-CoV-2 target nucleic acids are NOT DETECTED.  The SARS-CoV-2 RNA is generally detectable in upper and lower respiratory specimens during the acute phase of infection. Negative results do not preclude SARS-CoV-2 infection, do not rule out co-infections with other pathogens, and should not be used as the sole basis for treatment or other patient management decisions. Negative results must be combined with clinical observations, patient history, and epidemiological information. The expected result is Negative.  Fact Sheet for Patients: HairSlick.no  Fact Sheet for Healthcare  Providers: quierodirigir.com  This test is not yet approved or cleared by the [place] FDA and  has been authorized for detection and/or diagnosis of SARS-CoV-2 by FDA under an Emergency Use Authorization (EUA). This EUA will remain  in effect (meaning this test can be used) for the duration of the COVID-19 declaration under Se ction 564(b)(1) of the Act, 21 U.S.C. section 360bbb-3(b)(1), unless the authorization is terminated or revoked sooner.  Performed at [facility], [address] [place], [place] [address]   MRSA PCR Screening     Status: None   Collection Time: 05/01/20  6:38 AM   Specimen: Nasal Mucosa; Nasopharyngeal  Result Value Ref Range Status   MRSA by PCR NEGATIVE NEGATIVE Final    Comment:        The GeneXpert MRSA Assay (FDA approved for NASAL specimens only), is one component of a comprehensive MRSA colonization surveillance program. It is not intended to diagnose MRSA infection nor to guide or monitor treatment for MRSA infections. Performed at [facility], [address] [place], [place] [address]   Culture, Urine     Status: Abnormal   Collection Time: 05/01/20  4:29 PM   Specimen: Urine, Random  Result Value Ref Range Status   Specimen Description URINE, RANDOM  Final   Special Requests   Final    NONE Performed at [facility], [address], [place], [place] [address]    Culture >=100,000 COLONIES/mL KLEBSIELLA PNEUMONIAE (A)  Final   Report Status 05/04/2020 FINAL  Final   Organism ID, Bacteria KLEBSIELLA PNEUMONIAE (A)  Final      Susceptibility   Klebsiella pneumoniae - MIC*    AMPICILLIN >=32 RESISTANT Resistant     CEFAZOLIN <=4 SENSITIVE Sensitive     CEFEPIME <=0.12 SENSITIVE Sensitive     CEFTRIAXONE <=0.25 SENSITIVE Sensitive     CIPROFLOXACIN <=0.25 SENSITIVE Sensitive     GENTAMICIN <=1 SENSITIVE Sensitive     IMIPENEM <=0.25 SENSITIVE Sensitive     NITROFURANTOIN 128 RESISTANT  Resistant     TRIMETH/SULFA <=20 SENSITIVE Sensitive     AMPICILLIN/SULBACTAM 8 SENSITIVE Sensitive     PIP/TAZO <=4 SENSITIVE Sensitive     * >=100,000 COLONIES/mL KLEBSIELLA PNEUMONIAE  Culture, blood (routine x 2)     Status: None (Preliminary result)   Collection Time: 05/02/20 12:31 AM   Specimen: BLOOD RIGHT HAND  Result Value Ref Range Status   Specimen Description BLOOD RIGHT HAND  Final   Special Requests   Final    BOTTLES DRAWN AEROBIC AND ANAEROBIC Blood Culture adequate volume   Culture   Final    NO GROWTH 4 DAYS Performed at [facility], [address], [place], [place] [address]    Report Status PENDING  Incomplete  Culture, blood (routine x 2)     Status: None (Preliminary result)   Collection Time: 05/02/20 12:31 AM   Specimen: BLOOD LEFT HAND  Result  Value Ref Range Status   Specimen Description BLOOD LEFT HAND  Final   Special Requests   Final    BOTTLES DRAWN AEROBIC AND ANAEROBIC Blood Culture results may not be optimal due to an excessive volume of blood received in culture bottles   Culture   Final    NO GROWTH 4 DAYS Performed at [facility], [address] [place], [place] [address]    Report Status PENDING  Incomplete  Acid Fast Smear (AFB)     Status: None   Collection Time: 05/02/20  1:48 PM   Specimen: CSF; Cerebrospinal Fluid  Result Value Ref Range Status   AFB Specimen Processing Comment  Final    Comment: (NOTE) Straight Inoculation without Smear Performed At: [facility] [address] [place], [place] [id number] Jolene SchimkeNagendra Sanjai MD XB:[id number]:[phone]    Acid Fast Smear QNSAFB  Final    Comment: (NOTE) Test not performed. AFB Smear not performed due to specimen source (blood) or insufficient specimen.    Source (AFB) CSF  Final    Comment: Performed at [facility], [address] [place], [place] [address]  CSF culture w Stat Gram Stain     Status: None   Collection Time: 05/02/20  1:48 PM   Specimen: CSF;  Cerebrospinal Fluid  Result Value Ref Range Status   Specimen Description CSF  Final   Special Requests NONE  Final   Gram Stain   Final    WBC PRESENT,BOTH PMN AND MONONUCLEAR NO ORGANISMS SEEN CYTOSPIN SMEAR    Culture   Final    NO GROWTH 3 DAYS Performed at [facility], [address] [place], [place] [address]    Report Status 05/05/2020 FINAL  Final     Studies: MR BRAIN W WO CONTRAST  Result Date: 05/06/2020 CLINICAL DATA:  Meningitis. History of recent CSF leak. HIV positive. EXAM: MRI HEAD WITHOUT AND WITH CONTRAST TECHNIQUE: Multiplanar, multiecho pulse sequences of the brain and surrounding structures were obtained without and with intravenous contrast. CONTRAST:  10mL GADAVIST GADOBUTROL 1 MMOL/ML IV SOLN COMPARISON:  MRI head 05/01/2020. FINDINGS: Brain: Multiple small areas of restricted diffusion in the subarachnoid space over the convexity bilaterally have developed since the prior study. Restricted diffusion in the occipital horns bilaterally also has progressed. These findings are most consistent with meningitis. Small areas of restricted diffusion in the right frontal white matter, left putamen, left frontal white matter, right frontal cortex likely are due to small areas of acute infarct. No large territory acute infarct. There is mild meningeal enhancement on the right which is likely related to meningitis. No abscess or subdural fluid collection identified. 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 Negative for hydrocephalus. Negative for intracranial hemorrhage or mass lesion. Vascular: Normal arterial flow voids. Skull and upper cervical spine: No focal skeletal lesion. Sinuses/Orbits: Mucosal edema in the paranasal sinuses. Bilateral mastoid effusion. Other: None IMPRESSION: Interval development of small areas of restricted diffusion in the subarachnoid space over the convexity bilaterally compatible with meningitis. Progressive restricted diffusion  in the occipital horns bilaterally compatible with meningitis. No hydrocephalus or abscess. 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 Mild meningeal enhancement on the right likely related to meningitis. Empty sella.  This could be a source of the CSF leak. Electronically Signed   By: Marlan Palauharles  Clark M.D.   On: 05/06/2020 10:40      Joycelyn DasLaxman Karlita Lichtman, MD  [facility] 05/06/2020  If  7PM-7AM, please contact night-coverage

## 2020-05-06 NOTE — Progress Notes (Signed)
Inpatient Rehab Admissions Coordinator:    I met with Pt. For ongoing regarding potential CIR admission. Pt. States that she remains interested. I do not yet have insurance authorization but will open a case today.   Karli Wickizer, MS, CCC-SLP Rehab Admissions Coordinator  [phone] (celll) [phone] (office)  

## 2020-05-06 NOTE — Progress Notes (Signed)
MRI scan reviewed.  Patient with empty sella syndrome.  No other areas of significant abnormality.  No history of ongoing CSF leak even with posturing.  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  Generally in these cases the meningitis will scar the subarachnoid spaces down to the point where leakage will no longer occur.  At this point there is no indication for any type of neurosurgical intervention.  Continue antibiotics and observation.  Should the CSF leak return then we can look into treatment options.

## 2020-05-06 NOTE — Progress Notes (Signed)
[facility]  Date of Admission:  04/30/2020     Total days of antibiotics 6         ASSESSMENT:  Ms. Franzen completed her MRI with no concerns for Listeria and MRSA PCR is negative so will discontinue vancomycin and ampicillin. CSF cultures remain without growth. Awaiting neurosurgery evaluation for potential CSF leak and if any interventions may be needed. Will need PICC line placed. She is on Day 6 of treatment and will need 8 more days of ceftriaxone. Continue Odefsey for HIV. Disposition being considered for CIR.   PLAN:  1. Discontinue ampicillin and vancomycin.  2. Continue ceftriaxone through 3/23.  3. PICC line placement pending disposition decision.  4. Await neurosurgery evaluation for CSF leak.  5. Discontinue droplet precautions.   Principal Problem:   Meningitis Active Problems:   HIV disease (HCC)   Abnormal MRI   Cerebral ventriculitis   . calcium carbonate  1 tablet Oral TID WC  . Chlorhexidine Gluconate Cloth  6 each Topical Daily  . emtricitabine-rilpivir-tenofovir AF  1 tablet Oral Q breakfast  . enoxaparin (LOVENOX) injection  40 mg Subcutaneous Daily  . insulin aspart  0-20 Units Subcutaneous Q4H  . losartan  25 mg Oral Daily  . multivitamin with minerals  1 tablet Oral Daily    SUBJECTIVE:  Afebrile overnight with no acute events. Questioning when she can go home and also needs disability paper work completed.   Allergies  Allergen Reactions  . Codeine Other (See Comments)    Headache     Review of Systems: Review of Systems  Constitutional: Negative for chills, fever and weight loss.  Respiratory: Negative for cough, shortness of breath and wheezing.   Cardiovascular: Negative for chest pain and leg swelling.  Gastrointestinal: Negative for abdominal pain, constipation, diarrhea, nausea and vomiting.  Skin: Negative for rash.      OBJECTIVE: Vitals:   05/06/20 0056 05/06/20 0331 05/06/20 0458 05/06/20  0725  BP: 133/69 137/67  138/66  Pulse:    67  Resp: 18   18  Temp: 98.6 F (37 C)   97.9 F (36.6 C)  TempSrc: Oral   Oral  SpO2: 98% 99%  99%  Weight:   114.9 kg   Height:       Body mass index is 40.89 kg/m.  Physical Exam Constitutional:      General: She is not in acute distress.    Appearance: She is well-developed.  Cardiovascular:     Rate and Rhythm: Normal rate and regular rhythm.     Heart sounds: Normal heart sounds.  Pulmonary:     Effort: Pulmonary effort is normal.     Breath sounds: Normal breath sounds.  Skin:    General: Skin is warm and dry.  Neurological:     Mental Status: She is alert and oriented to person, place, and time.  Psychiatric:        Behavior: Behavior normal.        Thought Content: Thought content normal.        Judgment: Judgment normal.     Lab Results Lab Results  Component Value Date   WBC 8.4 05/05/2020   HGB 11.6 (L) 05/05/2020   HCT 34.6 (L) 05/05/2020   MCV 88.0 05/05/2020   PLT 310 05/05/2020    Lab Results  Component Value Date   CREATININE 0.72 05/04/2020   BUN 26 (H) 05/04/2020   NA 145 05/04/2020   K 3.5  05/04/2020   CL 114 (H) 05/04/2020   CO2 22 05/04/2020    Lab Results  Component Value Date   ALT 15 05/02/2020   AST 14 (L) 05/02/2020   ALKPHOS 70 05/02/2020   BILITOT 0.6 05/02/2020     Microbiology: Recent Results (from the past 240 hour(s))  SARS CORONAVIRUS 2 (TAT 6-24 HRS) Nasopharyngeal Nasopharyngeal Swab     Status: None   Collection Time: 04/30/20 10:54 PM   Specimen: Nasopharyngeal Swab  Result Value Ref Range Status   SARS Coronavirus 2 NEGATIVE NEGATIVE Final    Comment: (NOTE) SARS-CoV-2 target nucleic acids are NOT DETECTED.  The SARS-CoV-2 RNA is generally detectable in upper and lower respiratory specimens during the acute phase of infection. Negative results do not preclude SARS-CoV-2 infection, do not rule out co-infections with other pathogens, and should not be used as  the sole basis for treatment or other patient management decisions. Negative results must be combined with clinical observations, patient history, and epidemiological information. The expected result is Negative.  Fact Sheet for Patients: HairSlick.no  Fact Sheet for Healthcare Providers: quierodirigir.com  This test is not yet approved or cleared by the [place] FDA and  has been authorized for detection and/or diagnosis of SARS-CoV-2 by FDA under an Emergency Use Authorization (EUA). This EUA will remain  in effect (meaning this test can be used) for the duration of the COVID-19 declaration under Se ction 564(b)(1) of the Act, 21 U.S.C. section 360bbb-3(b)(1), unless the authorization is terminated or revoked sooner.  Performed at [facility], [address], [place], [place] [address]   MRSA PCR Screening     Status: None   Collection Time: 05/01/20  6:38 AM   Specimen: Nasal Mucosa; Nasopharyngeal  Result Value Ref Range Status   MRSA by PCR NEGATIVE NEGATIVE Final    Comment:        The GeneXpert MRSA Assay (FDA approved for NASAL specimens only), is one component of a comprehensive MRSA colonization surveillance program. It is not intended to diagnose MRSA infection nor to guide or monitor treatment for MRSA infections. Performed at [facility], [address], [place], [place] [address]   Culture, Urine     Status: Abnormal   Collection Time: 05/01/20  4:29 PM   Specimen: Urine, Random  Result Value Ref Range Status   Specimen Description URINE, RANDOM  Final   Special Requests   Final    NONE Performed at [facility], [address] [place], [place] [address]    Culture >=100,000 COLONIES/mL KLEBSIELLA PNEUMONIAE (A)  Final   Report Status 05/04/2020 FINAL  Final   Organism ID, Bacteria KLEBSIELLA PNEUMONIAE (A)  Final      Susceptibility   Klebsiella pneumoniae - MIC*     AMPICILLIN >=32 RESISTANT Resistant     CEFAZOLIN <=4 SENSITIVE Sensitive     CEFEPIME <=0.12 SENSITIVE Sensitive     CEFTRIAXONE <=0.25 SENSITIVE Sensitive     CIPROFLOXACIN <=0.25 SENSITIVE Sensitive     GENTAMICIN <=1 SENSITIVE Sensitive     IMIPENEM <=0.25 SENSITIVE Sensitive     NITROFURANTOIN 128 RESISTANT Resistant     TRIMETH/SULFA <=20 SENSITIVE Sensitive     AMPICILLIN/SULBACTAM 8 SENSITIVE Sensitive     PIP/TAZO <=4 SENSITIVE Sensitive     * >=100,000 COLONIES/mL KLEBSIELLA PNEUMONIAE  Culture, blood (routine x 2)     Status: None (Preliminary result)   Collection Time: 05/02/20 12:31 AM   Specimen: BLOOD  RIGHT HAND  Result Value Ref Range Status   Specimen Description BLOOD RIGHT HAND  Final   Special Requests   Final    BOTTLES DRAWN AEROBIC AND ANAEROBIC Blood Culture adequate volume   Culture   Final    NO GROWTH 4 DAYS Performed at [facility], [address] [place], [place] [address]    Report Status PENDING  Incomplete  Culture, blood (routine x 2)     Status: None (Preliminary result)   Collection Time: 05/02/20 12:31 AM   Specimen: BLOOD LEFT HAND  Result Value Ref Range Status   Specimen Description BLOOD LEFT HAND  Final   Special Requests   Final    BOTTLES DRAWN AEROBIC AND ANAEROBIC Blood Culture results may not be optimal due to an excessive volume of blood received in culture bottles   Culture   Final    NO GROWTH 4 DAYS Performed at [facility], [address], [place], [place] [address]    Report Status PENDING  Incomplete  Acid Fast Smear (AFB)     Status: None   Collection Time: 05/02/20  1:48 PM   Specimen: CSF; Cerebrospinal Fluid  Result Value Ref Range Status   AFB Specimen Processing Comment  Final    Comment: (NOTE) Straight Inoculation without Smear Performed At: [facility] [address] [place], [place] [id number] Jolene Schimke MD CH:[id number]    Acid Fast Smear QNSAFB  Final    Comment: (NOTE) Test  not performed. AFB Smear not performed due to specimen source (blood) or insufficient specimen.    Source (AFB) CSF  Final    Comment: Performed at [facility], [address] [place], [place] [address]  CSF culture w Stat Gram Stain     Status: None   Collection Time: 05/02/20  1:48 PM   Specimen: CSF; Cerebrospinal Fluid  Result Value Ref Range Status   Specimen Description CSF  Final   Special Requests NONE  Final   Gram Stain   Final    WBC PRESENT,BOTH PMN AND MONONUCLEAR NO ORGANISMS SEEN CYTOSPIN SMEAR    Culture   Final    NO GROWTH 3 DAYS Performed at [facility], [address], [place], [place] [address]    Report Status 05/05/2020 FINAL  Final     Marcos Eke, NP [facility]  05/06/2020  11:51 AM

## 2020-05-07 ENCOUNTER — Inpatient Hospital Stay: Payer: Self-pay

## 2020-05-07 LAB — GLUCOSE, CAPILLARY
Glucose-Capillary: 102 mg/dL — ABNORMAL HIGH (ref 70–99)
Glucose-Capillary: 109 mg/dL — ABNORMAL HIGH (ref 70–99)
Glucose-Capillary: 118 mg/dL — ABNORMAL HIGH (ref 70–99)
Glucose-Capillary: 132 mg/dL — ABNORMAL HIGH (ref 70–99)
Glucose-Capillary: 144 mg/dL — ABNORMAL HIGH (ref 70–99)
Glucose-Capillary: 149 mg/dL — ABNORMAL HIGH (ref 70–99)
Glucose-Capillary: 159 mg/dL — ABNORMAL HIGH (ref 70–99)
Glucose-Capillary: 92 mg/dL (ref 70–99)

## 2020-05-07 LAB — CULTURE, BLOOD (ROUTINE X 2)
Culture: NO GROWTH
Culture: NO GROWTH
Special Requests: ADEQUATE

## 2020-05-07 LAB — BASIC METABOLIC PANEL
Anion gap: 6 (ref 5–15)
BUN: 24 mg/dL — ABNORMAL HIGH (ref 8–23)
CO2: 24 mmol/L (ref 22–32)
Calcium: 8.1 mg/dL — ABNORMAL LOW (ref 8.9–10.3)
Chloride: 107 mmol/L (ref 98–111)
Creatinine, Ser: 0.63 mg/dL (ref 0.44–1.00)
GFR, Estimated: >60 mL/min (ref >60)
Glucose, Bld: 103 mg/dL — ABNORMAL HIGH (ref 70–99)
Potassium: 3.5 mmol/L (ref 3.5–5.1)
Sodium: 137 mmol/L (ref 135–145)

## 2020-05-07 LAB — CBC
HCT: 34 % — ABNORMAL LOW (ref 36.0–46.0)
Hemoglobin: 11.3 g/dL — ABNORMAL LOW (ref 12.0–15.0)
MCH: 29.5 pg (ref 26.0–34.0)
MCHC: 33.2 g/dL (ref 30.0–36.0)
MCV: 88.8 fL (ref 80.0–100.0)
Platelets: 273 10*3/uL (ref 150–400)
RBC: 3.83 MIL/uL — ABNORMAL LOW (ref 3.87–5.11)
RDW: 14.8 % (ref 11.5–15.5)
WBC: 8.3 10*3/uL (ref 4.0–10.5)
nRBC: 0 % (ref 0.0–0.2)

## 2020-05-07 LAB — BLASTOMYCES ANTIGEN: Blastomyces Antigen: NOT DETECTED ng/mL

## 2020-05-07 LAB — CYTOLOGY - NON PAP

## 2020-05-07 LAB — MAGNESIUM: Magnesium: 2.1 mg/dL (ref 1.7–2.4)

## 2020-05-07 MED ORDER — LOSARTAN POTASSIUM 50 MG PO TABS
50.0000 mg | ORAL_TABLET | Freq: Every day | ORAL | Status: DC
Start: 2020-05-08 — End: 2020-05-08
  Administered 2020-05-08: 50 mg via ORAL
  Filled 2020-05-07: qty 1

## 2020-05-07 MED ORDER — SODIUM CHLORIDE 0.9% FLUSH
10.0000 mL | Freq: Two times a day (BID) | INTRAVENOUS | Status: DC
  Administered 2020-05-07 – 2020-05-08 (×2): 10 mL

## 2020-05-07 MED ORDER — SODIUM CHLORIDE 0.9% FLUSH
10.0000 mL | INTRAVENOUS | Status: DC | PRN
Start: 2020-05-07 — End: 2020-05-08
  Administered 2020-05-08: 10 mL

## 2020-05-07 MED ORDER — CEFTRIAXONE IV (FOR PTA / DISCHARGE USE ONLY): 2.0000 g | Freq: Two times a day (BID) | INTRAVENOUS | 0 refills | Status: DC

## 2020-05-07 NOTE — Progress Notes (Signed)
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  Appreciate NSGY evaluation and help with management of CSF leak.  . Continue ceftriaxone 2gm BID for 2 weeks.  End date = 05/15/20 . Place PICC . See OPAT note.  Will arrange [facility] follow up  #HIV disease Well controlled historically.  VL undetectable this admission and CD4 count low but percentage maintained in the setting of acute illness.  Do not suspect an opportunistic process. . Continue home [place] . No need for PCP prophylaxis  Diagnosis: Bacterial meningitis  Culture Result: Negative  Allergies  Allergen Reactions  . Codeine Other (See Comments)    Headache    OPAT Orders Discharge antibiotics to be given via PICC line Discharge antibiotics: Per pharmacy protocol  Ceftriaxone 2gm BID IV  Duration: 2 weeks End Date: 05/15/20  [facility] Per Protocol:  Home health RN for IV administration and teaching; PICC line care and labs.    Labs weekly while on IV antibiotics: x__ CBC with differential __ BMP x__ CMP __ CRP __ ESR __ Vancomycin trough __ CK  x__ Please pull PIC at completion of IV antibiotics __ Please leave PIC in place until doctor has seen patient or been notified  Fax weekly labs to [phone]  Clinic Follow Up Appt: 05/14/20 at 9am with Dr Megan Salon.   SUBJECTIVE:   No acute events.  Feels well.  Ready to be discharged whether it be home or to rehab.  She has no fevers, chills.  No headaches.  Tolerating abx.      OBJECTIVE:   Blood pressure (!) 147/67, pulse 100, temperature 98 F (36.7 C), temperature source Oral, resp. rate 18, height 5' 6"  (1.676 m), weight 117.2 kg, SpO2 99 %. Body mass index is 41.7 kg/m.  Physical Exam Constitutional:      General: She is not in acute distress. HENT:     Head: Normocephalic and atraumatic.  Pulmonary:     Effort: Pulmonary effort is normal. No respiratory distress.  Musculoskeletal:     Cervical back: Normal range of motion and neck supple.  Neurological:     General: No focal deficit present.     Mental Status: She is alert and oriented to person, place, and time.     Cranial Nerves: No cranial nerve deficit.  Psychiatric:        Mood and Affect: Mood normal.        Behavior: Behavior normal.       Lab Results: Lab Results  Component Value Date   WBC 8.3 05/07/2020   HGB 11.3 (L) 05/07/2020   HCT 34.0 (L) 05/07/2020   MCV 88.8 05/07/2020   PLT 273 05/07/2020    Lab Results  Component Value Date   NA 137 05/07/2020   K 3.5 05/07/2020   CO2 24 05/07/2020   GLUCOSE 103 (H) 05/07/2020   BUN 24 (H) 05/07/2020   CREATININE 0.63 05/07/2020   CALCIUM 8.1 (L) 05/07/2020   GFRNONAA >60  05/07/2020   GFRAA >60 12/13/2016    Lab Results  Component Value Date   ALT 15 05/02/2020   AST 14 (L) 05/02/2020   ALKPHOS 70 05/02/2020   BILITOT 0.6 05/02/2020    No results found for: CRP  No results found for: ESRSEDRATE   I have reviewed the micro and lab results in Epic.  Imaging: MR BRAIN W WO CONTRAST  Result Date: 05/06/2020 CLINICAL DATA:  Meningitis. History of recent CSF leak. HIV positive. EXAM: MRI HEAD WITHOUT AND WITH CONTRAST TECHNIQUE: Multiplanar, multiecho pulse sequences of the brain and surrounding structures were obtained without and with intravenous contrast. CONTRAST:  63m GADAVIST GADOBUTROL 1 MMOL/ML IV SOLN COMPARISON:  MRI head 05/01/2020. FINDINGS: Brain: Multiple small areas of restricted diffusion in the  subarachnoid space over the convexity bilaterally have developed since the prior study. Restricted diffusion in the occipital horns bilaterally also has progressed. These findings are most consistent with meningitis. Small areas of restricted diffusion in the right frontal white matter, left putamen, left frontal white matter, right frontal cortex likely are due to small areas of acute infarct. No large territory acute infarct. There is mild meningeal enhancement on the right which is likely related to meningitis. No abscess or subdural fluid collection identified. 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 Negative for hydrocephalus. Negative for intracranial hemorrhage or mass lesion. Vascular: Normal arterial flow voids. Skull and upper cervical spine: No focal skeletal lesion. Sinuses/Orbits: Mucosal edema in the paranasal sinuses. Bilateral mastoid effusion. Other: None IMPRESSION: Interval development of small areas of restricted diffusion in the subarachnoid space over the convexity bilaterally compatible with meningitis. Progressive restricted diffusion in the occipital horns bilaterally compatible with meningitis. No hydrocephalus or abscess. 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 Mild meningeal enhancement on the right likely related to meningitis. Empty sella.  This could be a source of the CSF leak. Electronically Signed   By: CFranchot GalloM.D.   On: 05/06/2020 10:40     Imaging  independently reviewed in Epic.    ARaynelle Highlandfor Infectious Disease [phone] 05/07/2020, 11:32 AM  I spent greater than 35 minutes with the patient including greater than 50% of time in face to face counsel of the patient and in coordination of their care.

## 2020-05-07 NOTE — Progress Notes (Signed)
Inpatient Rehab Admissions Coordinator:   PT and OT have updated their notes and are now recommending no OT follow up and outpatient PT.  Pt. States she feels comfortable going home at current level. Cir will sign off.   Megan Salon, MS, CCC-SLP Rehab Admissions Coordinator  [phone] (celll) [phone] (office)

## 2020-05-07 NOTE — Progress Notes (Signed)
Physical Therapy Treatment Patient Details Name: Gail Edwards MRN: [id number] DOB: 1955/02/29 Today's Date: 05/07/2020    History of Present Illness 65 y.o. female presenting with garbled speech and L facial droop. Patient initially treated with tPA. On 05/02/2019 patient developed worsening confusion.  MRI (+) small intraventricular hemorrhage versus possible ventriculitis. ETT s/p extubation. 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 PMHx significant for HIV, pre-diabetes, and ORIF R humerus 10/18.    PT Comments    Goals updated due to patient's substantial progress. Patient ambulated 500' with RW and 54' with no AD and min guard. Patient with mild balance deficits more prevalent with no AD, however patient able to self correct mild balance disturbances. Given patient's progress, discharge recommendation updated to OPPT to continue improving balance and strength to assist with return to PLOF. Patient has necessary supervision/assistance by brother at d/c.    Follow Up Recommendations  Outpatient PT;Supervision - Intermittent     Equipment Recommendations  3in1 (PT)    Recommendations for Other Services       Precautions / Restrictions Precautions Precautions: Fall Restrictions Weight Bearing Restrictions: No    Mobility  Bed Mobility Overal bed mobility: Modified Independent             General bed mobility comments: Pt was OOB in the recliner chair.    Transfers Overall transfer level: Needs assistance Equipment used: Rolling Hilja Kintzel (2 wheeled) Transfers: Sit to/from Stand Sit to Stand: Supervision         General transfer comment: Supervision A fo safety with use of RW.  Ambulation/Gait Ambulation/Gait assistance: Min guard Gait Distance (Feet): 500 Feet (x 75') Assistive device: Rolling Tayvon Culley (2 wheeled);None Gait Pattern/deviations: Step-through pattern;Trendelenburg     General  Gait Details: ambulated 500' with RW and 75' with no AD and min guard. Patient drifts L/R without AD, however able to self correct mild LOB. Patient demos trendelenburg gait due to LE weakness.   Stairs             Wheelchair Mobility    Modified Rankin (Stroke Patients Only)       Balance Overall balance assessment: Mild deficits observed, not formally tested                                          Cognition Arousal/Alertness: Awake/alert Behavior During Therapy: WFL for tasks assessed/performed Overall Cognitive Status: Within Functional Limits for tasks assessed                                 General Comments: Appears within functional for tasks assessed today.      Exercises      General Comments        Pertinent Vitals/Pain Pain Assessment: No/denies pain    Home Living                      Prior Function            PT Goals (current goals can now be found in the care plan section) Acute Rehab PT Goals Patient Stated Goal: To return home. PT Goal Formulation: With patient Time For Goal Achievement: 05/17/20 Potential to Achieve Goals: Good Progress towards PT goals: Progressing toward goals    Frequency  Min 3X/week      PT Plan Discharge plan needs to be updated    Co-evaluation              AM-PAC PT "6 Clicks" Mobility   Outcome Measure  Help needed turning from your back to your side while in a flat bed without using bedrails?: A Little Help needed moving from lying on your back to sitting on the side of a flat bed without using bedrails?: A Little Help needed moving to and from a bed to a chair (including a wheelchair)?: A Little Help needed standing up from a chair using your arms (e.g., wheelchair or bedside chair)?: A Little Help needed to walk in hospital room?: A Little Help needed climbing 3-5 steps with a railing? : A Little 6 Click Score: 18    End of Session Equipment  Utilized During Treatment: Gait belt Activity Tolerance: Patient tolerated treatment well Patient left: in chair;with call bell/phone within reach Nurse Communication: Mobility status PT Visit Diagnosis: Unsteadiness on feet (R26.81);Muscle weakness (generalized) (M62.81);Difficulty in walking, not elsewhere classified (R26.2)     Time: 5643-3295 PT Time Calculation (min) (ACUTE ONLY): 23 min  Charges:  $Gait Training: 8-22 mins $Therapeutic Activity: 8-22 mins                     Albertine Lafoy A. Dan Humphreys PT, DPT Acute Rehabilitation Services Pager [phone] Office [phone]    Viviann Spare 05/07/2020, 12:24 PM

## 2020-05-07 NOTE — Progress Notes (Signed)
Occupational Therapy Treatment Patient Details Name: Gail Edwards MRN: [id number] DOB: 1955/09/18 Today's Date: 05/07/2020    History of present illness 65 y.o. female presenting with garbled speech and L facial droop. Patient initially treated with tPA. On 05/02/2019 patient developed worsening confusion.  MRI (+) small intraventricular hemorrhage versus possible ventriculitis. ETT s/p extubation. 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 PMHx significant for HIV, pre-diabetes, and ORIF R humerus 10/18.   OT comments  Patient met lying supine in bed in agreement with OT treatment session with focus on self-care re-education, ADL transfers, and d/c planning. Patient has met 4/4 OT goals. Please refer to updated goals below. Patient able to perform UB/LB bathing at sink level with supervision A for safety and intermittent use of RW. Patient also performing all ADL transfers with supervision A. Given patient progress, recommendation updated to no needs. OT to continue to follow acutely to maximize safety/indepedence with self-care tasks in prep for safe d/c home with intermittent assist from family as needed for IADLs.   Follow Up Recommendations  No OT follow up;Supervision - Intermittent    Equipment Recommendations  None recommended by OT    Recommendations for Other Services      Precautions / Restrictions Precautions Precautions: Fall Restrictions Weight Bearing Restrictions: No       Mobility Bed Mobility Overal bed mobility: Modified Independent                  Transfers Overall transfer level: Needs assistance Equipment used: Rolling walker (2 wheeled)   Sit to Stand: Supervision         General transfer comment: Supervision A fo safety with use of RW.    Balance Overall balance assessment: Mild deficits observed, not formally tested                                          ADL either performed or assessed with clinical judgement   ADL Overall ADL's : Needs assistance/impaired     Grooming: Oral care;Supervision/safety;Standing Grooming Details (indicate cue type and reason): 3/3 grooming tasks standing at sink with supervision A for safety. Upper Body Bathing: Set up;Sitting Upper Body Bathing Details (indicate cue type and reason): Seated EOB with set-up assist. Lower Body Bathing: Sit to/from stand;Supervison/ safety Lower Body Bathing Details (indicate cue type and reason): Supervision A in sitting/standing to wash front perineal area, buttocks, and upper legs. Patient declined washing lower legs and feet. May benefit from AE to maximize independence. Upper Body Dressing : Set up;Sitting Upper Body Dressing Details (indicate cue type and reason): Doffed/donned anterior hospital gown.     Toilet Transfer: Consulting civil engineer Details (indicate cue type and reason): Simulated with transfer to recliner with supervision A and use of RW.         Functional mobility during ADLs: Supervision/safety General ADL Comments: Patient making great progress toward goals. Continued generalized weakness although improving.     Vision       Perception     Praxis      Cognition Arousal/Alertness: Awake/alert Behavior During Therapy: WFL for tasks assessed/performed Overall Cognitive Status: Impaired/Different from baseline                                 General Comments:  Appears within functional for tasks assessed today.        Exercises     Shoulder Instructions       General Comments      Pertinent Vitals/ Pain       Pain Assessment: No/denies pain  Home Living                                          Prior Functioning/Environment              Frequency  Min 2X/week        Progress Toward Goals  OT Goals(current goals can now be found in the care plan section)  Progress  towards OT goals: Progressing toward goals  Acute Rehab OT Goals Patient Stated Goal: To return home. OT Goal Formulation: With patient Time For Goal Achievement: 05/17/20 Potential to Achieve Goals: Good ADL Goals Pt Will Perform Upper Body Dressing: with modified independence;sitting Pt Will Perform Lower Body Dressing: with modified independence;sit to/from stand;with adaptive equipment Additional ADL Goal #1: Patient will complete all ADL transfers with Mod I and LRAD demonstrating good safety awareness. Additional ADL Goal #2: Patient will tolerate BUE HEP with written handout to improve strength in prep for ADLs.  Plan Discharge plan needs to be updated;Frequency remains appropriate    Co-evaluation                 AM-PAC OT "6 Clicks" Daily Activity     Outcome Measure   Help from another person eating meals?: None Help from another person taking care of personal grooming?: A Little Help from another person toileting, which includes using toliet, bedpan, or urinal?: A Little Help from another person bathing (including washing, rinsing, drying)?: A Little Help from another person to put on and taking off regular upper body clothing?: A Little Help from another person to put on and taking off regular lower body clothing?: A Little 6 Click Score: 19    End of Session Equipment Utilized During Treatment: Gait belt;Rolling walker  OT Visit Diagnosis: Unsteadiness on feet (R26.81);Muscle weakness (generalized) (M62.81)   Activity Tolerance Patient tolerated treatment well   Patient Left in chair;with call bell/phone within reach;with chair alarm set   Nurse Communication          Time: 2336-1224 OT Time Calculation (min): 28 min  Charges: OT General Charges $OT Visit: 1 Visit OT Treatments $Self Care/Home Management : 23-37 mins  Kaeleigh Westendorf H. OTR/L Supplemental OT, Department of rehab services [phone]   Eiman Maret R H. 05/07/2020, 11:40 AM

## 2020-05-07 NOTE — TOC Initial Note (Signed)
Transition of Care [facility]) - Initial/Assessment Note    Patient Details  Name: Gail Edwards MRN: [id number] Date of Birth: Oct 29, 1955  Transition of Care [facility]) CM/SW Contact:    Pollie Friar, RN Phone Number: 05/07/2020, 2:20 PM  Clinical Narrative:                 Recommendations have changed from CIR to outpatient therapy. CM met with the patient and she states her brother is coming to stay with as long as needed for supervision and assistance at home.  CM offered choice for HH and IV abx and she had no preference. Manito RN to be arranged through [employer] with [employer]. Pam with [employer] will come and provide needed education for home IV abx. Await PICC placement and then d/c home per MD.  TOC following.  Expected Discharge Plan: [place] Barriers to Discharge: Continued Medical Work up   Patient Goals and CMS Choice   CMS Medicare.gov Compare Post Acute Care list provided to:: Patient Choice offered to / list presented to : Patient  Expected Discharge Plan and Services Expected Discharge Plan: [place]   Discharge Planning Services: CM Consult Post Acute Care Choice: [place] arrangements for the past 2 months: Single Family Home                           HH Arranged: RN HH Agency: [facility]) Date HH Agency Contacted: 05/07/20   Representative spoke with at [place]: Pam  Prior Living Arrangements/Services Living arrangements for the past 2 months: [place] with:: Self Patient language and need for interpreter reviewed:: Yes Do you feel safe going back to the place where you live?: Yes      Need for Family Participation in Patient Care: Yes (Comment) (intermittent) Care giver support system in place?: Yes (comment)   Criminal Activity/Legal Involvement Pertinent to Current Situation/Hospitalization: No - Comment as needed  Activities of Daily Living      Permission  Sought/Granted                  Emotional Assessment Appearance:: Appears stated age Attitude/Demeanor/Rapport: Engaged Affect (typically observed): Accepting Orientation: : Oriented to Self,Oriented to Place,Oriented to  Time,Oriented to Situation   Psych Involvement: No (comment)  Admission diagnosis:  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 Encounter for intubation   . Encounter for orogastric (OG) tube placement   . Meningitis 05/04/2020  . Abnormal MRI   . Cerebral ventriculitis   . 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 Alopecia 01/04/2018  . Bruising 01/04/2018  . Hyperlipidemia 12/06/2012  . Elevated blood sugar 05/29/2012  . Insomnia 05/29/2012  . HTN (hypertension) 11/25/2011  . PAP SMER CERV W/LW GRADE SQUAMOUS INTRAEPITH LES 06/20/2007  . PSORIASIS NEC 11/28/2006  . BACK PAIN 11/25/2005  . HIV disease ([place]) 11/24/2005   PCP:  Leeroy Cha, MD Pharmacy:   [facility], [address], [address] [place], [place] [address] Phone: [phone] Fax: [phone]  [id number] [place] [address] [facility] [id number]: [phone]: [address]  [address] Phone: [phone] Fax: [phone]     Social Determinants of Health (SDOH) Interventions    Readmission Risk Interventions No flowsheet data found.

## 2020-05-07 NOTE — Progress Notes (Signed)
Peripherally Inserted Central Catheter Placement  The IV Nurse has discussed with the patient and/or persons authorized to consent for the patient, the purpose of this procedure and the potential benefits and risks involved with this procedure.  The benefits include less needle sticks, lab draws from the catheter, and the patient may be discharged home with the catheter. Risks include, but not limited to, infection, bleeding, blood clot (thrombus formation), and puncture of an artery; nerve damage and irregular heartbeat and possibility to perform a PICC exchange if needed/ordered by physician.  Alternatives to this procedure were also discussed.  Bard Power PICC patient education guide, fact sheet on infection prevention and patient information card has been provided to patient /or left at bedside. PICC placed by Terie Purser RN.    PICC Placement Documentation  PICC Single Lumen 05/07/20 Left Basilic 41 cm 0 cm (Active)  Indication for Insertion or Continuance of Line Home intravenous therapies (PICC only) 05/07/20 2058  Exposed Catheter (cm) 0 cm 05/07/20 2058  Site Assessment Clean;Dry;Intact 05/07/20 2058  Line Status Blood return noted;Flushed;Saline locked 05/07/20 2058  Dressing Type Transparent;Occlusive;Securing device 05/07/20 2058  Dressing Status Clean;Dry;Intact 05/07/20 2058  Antimicrobial disc in place? Yes 05/07/20 2058  Safety Lock Not Applicable 05/07/20 2058  Line Adjustment (NICU/IV Team Only) No 05/07/20 2058  Dressing Intervention New dressing 05/07/20 2058  Dressing Change Due 05/14/20 05/07/20 2058       Christeen Douglas 05/07/2020, 9:00 PM

## 2020-05-07 NOTE — Plan of Care (Signed)
  Problem: Education: Goal: Knowledge of disease or condition will improve Outcome: Progressing Goal: Knowledge of secondary prevention will improve Outcome: Progressing Goal: Knowledge of patient specific risk factors addressed and post discharge goals established will improve Outcome: Progressing Goal: Individualized Educational Video(s) Outcome: Progressing   Problem: Coping: Goal: Will verbalize positive feelings about self Outcome: Progressing Goal: Will identify appropriate support needs Outcome: Progressing   Problem: Health Behavior/Discharge Planning: Goal: Ability to manage health-related needs will improve Outcome: Progressing   Problem: Self-Care: Goal: Ability to participate in self-care as condition permits will improve Outcome: Progressing Goal: Verbalization of feelings and concerns over difficulty with self-care will improve Outcome: Progressing Goal: Ability to communicate needs accurately will improve Outcome: Progressing   Problem: Nutrition: Goal: Risk of aspiration will decrease Outcome: Progressing Goal: Dietary intake will improve Outcome: Progressing   Problem: Ischemic Stroke/TIA Tissue Perfusion: Goal: Complications of ischemic stroke/TIA will be minimized Outcome: Progressing   Problem: Education: Goal: Knowledge of General Education information will improve Description: Including pain rating scale, medication(s)/side effects and non-pharmacologic comfort measures Outcome: Progressing   Problem: Health Behavior/Discharge Planning: Goal: Ability to manage health-related needs will improve Outcome: Progressing   Problem: Clinical Measurements: Goal: Ability to maintain clinical measurements within normal limits will improve Outcome: Progressing Goal: Will remain free from infection Outcome: Progressing Goal: Diagnostic test results will improve Outcome: Progressing Goal: Respiratory complications will improve Outcome: Progressing Goal:  Cardiovascular complication will be avoided Outcome: Progressing   Problem: Activity: Goal: Risk for activity intolerance will decrease Outcome: Progressing   Problem: Nutrition: Goal: Adequate nutrition will be maintained Outcome: Progressing   Problem: Coping: Goal: Level of anxiety will decrease Outcome: Progressing   Problem: Elimination: Goal: Will not experience complications related to bowel motility Outcome: Progressing Goal: Will not experience complications related to urinary retention Outcome: Progressing   Problem: Pain Managment: Goal: General experience of comfort will improve Outcome: Progressing   Problem: Safety: Goal: Ability to remain free from injury will improve Outcome: Progressing   Problem: Skin Integrity: Goal: Risk for impaired skin integrity will decrease Outcome: Progressing   Problem: Safety: Goal: Non-violent Restraint(s) Outcome: Progressing

## 2020-05-07 NOTE — Progress Notes (Signed)
PROGRESS NOTE  Gail Edwards RUE:[id number]:[id number] DOB: 1956-02-16 DOA: 04/30/2020 PCP: [name], Rupashree, MD   LOS: 7 days   Brief narrative: Gail Edwards is a 65 years old female with past medical history of HIV presented to hospital on 04/30/2020 with garbled speech and left facial droop.  She was initially treated with TPA for possible stroke.  On 05/02/2019 Gail Edwards developed worsening confusion.  MRI of the brain was performed which showed a small intraventricular hemorrhage versus possible ventriculitis.  PCCM was consulted due to progressive decline in mental status and Gail Edwards needing intubation.    Subsequently, ID was consulted.  Gail Edwards was given IV antibiotics after lumbar puncture was highly suspected for bacterial meningitis.  Symptoms have significantly improved at this time.  She was seen by physical therapy who recommended CIR.  Assessment/Plan:  Principal Problem:   Meningitis Active Problems:   HIV disease (HCC)   Abnormal MRI   Cerebral ventriculitis   Encounter for intubation   Encounter for orogastric (OG) tube placement  Acute  metabolic encephalopathy due to acute bacterial meningitis/ventriculitis Encephalopathy has resolved at this time.  LP performed was consistent with acute bacterial meningitis.  Lumbar puncture culture negative.  ID recommends ceftriaxone through 3/23.  Will need PICC line placement for antibiotics.  Received IV steroids for bacterial meningitis.   2D echocardiogram without any vegetations.  CSF culture with with no growth so far.  Toxoplasma IgG positive. Cryptococcal serum antigen negative.  HIV RNA less than 20.  CD4 count 121 on 05/02/2020.  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  Neurosurgery has seen the Gail Edwards and recommended conservative treatment.  Acute respiratory failure with hypoxia Gail Edwards was intubated for worsening mental status.  Currently on room air after extubation.   Hyponatremia resolved.    Elevated blood pressure.  History of  hypertension.  Not on medication as outpatient.  Losartan was initiated with overall improved blood pressure.  Losartan dose will increase to 50 mg daily.  HIV Followed by Dr. Ninetta LightsHatcher outpatient.  Continue Emtricitabine-Rilpivir-Tenofovir .  Infectious disease on board.  We will continue current medication.  Prediabetes.  Latest A1c of 5.7.  Not on medications at home.  Morbid obesity Would benefit from ongoing weight loss as outpatient.  Debility, deconditioning.  Gail Edwards has been seen by physical therapy who recommended CIR.  PMR has been consulted  DVT prophylaxis: enoxaparin (LOVENOX) injection 40 mg Start: 05/03/20 1000 SCDs Start: 05/03/20 0930  Code Status: Full code  Family Communication:  None today.   Status is: Inpatient  Remains inpatient appropriate because:IV treatments appropriate due to intensity of illness or inability to take PO, Inpatient level of care appropriate due to severity of illness and IV antibiotics   Dispo: The Gail Edwards is from: Home              Anticipated d/c is to: Home              Gail Edwards currently is not medically stable to d/c.   Difficult to place Gail Edwards No  Consultants:  PCCM  Infectious disease  Neurology   Procedures:  NG tube placement,  Intubation and mechanical ventilation  Lumbar puncture  Anti-infectives: Marland Kitchen. Rocephin IV  Subjective: Today, Gail Edwards was seen and examined at bedside.  Gail Edwards denies any nausea vomiting fever chills or rigor.    Objective: Vitals:   05/07/20 0757 05/07/20 1150  BP: (!) 147/67 (!) 149/81  Pulse: 100 84  Resp: 18 16  Temp: 98 F (36.7 C) 98.2 F (  36.8 C)  SpO2: 99% 98%    Intake/Output Summary (Last 24 hours) at 05/07/2020 1155 Last data filed at 05/07/2020 0850 Gross per 24 hour  Intake 238 ml  Output 1000 ml  Net -762 ml   Filed Weights   05/03/20 0300 05/06/20 0458 05/07/20 0500  Weight: 112.7 kg 114.9 kg 117.2 kg   Body mass index is 41.7 kg/m.   Physical  Exam: General: Morbidly obese built, not in obvious distress HENT:   No scleral pallor or icterus noted. Oral mucosa is moist.  Neck supple Chest:  Clear breath sounds.  Diminished breath sounds bilaterally. No crackles or wheezes.  CVS: S1 &S2 heard. No murmur.  Regular rate and rhythm. Abdomen: Soft, nontender, nondistended.  Bowel sounds are heard.   Extremities: No cyanosis, clubbing or edema.  Peripheral pulses are palpable. Psych: Alert, awake and oriented, normal mood CNS:  No cranial nerve deficits.  Power equal in all extremities.   Skin: Warm and dry.  No rashes noted.  Data Review: I have personally reviewed the following laboratory data and studies,  CBC: Recent Labs  Lab 04/30/20 2106 04/30/20 2131 05/01/20 0905 05/01/20 1418 05/02/20 0031 05/05/20 0055 05/07/20 0423  WBC 10.7*  --   --   --  14.6* 8.4 8.3  NEUTROABS 9.6*  --   --   --   --   --   --   HGB 13.0   < > 13.3 12.2 11.8* 11.6* 11.3*  HCT 38.5   < > 39.0 36.0 34.2* 34.6* 34.0*  MCV 89.5  --   --   --  87.7 88.0 88.8  PLT 336  --   --   --  238 310 273   < > = values in this interval not displayed.   Basic Metabolic Panel: Recent Labs  Lab 04/30/20 2106 04/30/20 2131 05/01/20 0905 05/01/20 1405 05/01/20 1418 05/01/20 1700 05/02/20 0031 05/02/20 1637 05/03/20 0433 05/04/20 0421 05/07/20 0423  NA 135 136   < >  --  135  --  134*  --  139 145 137  K 4.1 4.1   < >  --  3.2*  --  3.7  --  3.6 3.5 3.5  CL 105 104  --   --   --   --  106  --  111 114* 107  CO2 20*  --   --   --   --   --  19*  --  20* 22 24  GLUCOSE 107* 106*  --   --   --   --  237*  --  220* 133* 103*  BUN 14 16  --   --   --   --  13  --  26* 26* 24*  CREATININE 0.88 0.80  --   --   --   --  0.87  --  0.78 0.72 0.63  CALCIUM 9.1  --   --   --   --   --  8.6*  --  8.6* 8.5* 8.1*  MG  --   --   --  1.8  --  1.8 1.9 2.4  --   --  2.1  PHOS  --   --   --  1.9*  --  2.8 2.7 1.9*  --  2.7  --    < > = values in this interval not  displayed.   Liver Function Tests: Recent Labs  Lab 04/30/20 2106 05/02/20 0031  AST 20 14*  ALT 17 15  ALKPHOS 89 70  BILITOT 1.1 0.6  PROT 6.7 6.2*  ALBUMIN 3.9 3.1*   No results for input(s): LIPASE, AMYLASE in the last 168 hours. Recent Labs  Lab 05/01/20 1028  AMMONIA 33   Cardiac Enzymes: No results for input(s): CKTOTAL, CKMB, CKMBINDEX, TROPONINI in the last 168 hours. BNP (last 3 results) No results for input(s): BNP in the last 8760 hours.  ProBNP (last 3 results) No results for input(s): PROBNP in the last 8760 hours.  CBG: Recent Labs  Lab 05/06/20 1631 05/06/20 2002 05/06/20 2327 05/07/20 0349 05/07/20 0807  GLUCAP 108* 105* 97 102* 149*   Recent Results (from the past 240 hour(s))  SARS CORONAVIRUS 2 (TAT 6-24 HRS) Nasopharyngeal Nasopharyngeal Swab     Status: None   Collection Time: 04/30/20 10:54 PM   Specimen: Nasopharyngeal Swab  Result Value Ref Range Status   SARS Coronavirus 2 NEGATIVE NEGATIVE Final    Comment: (NOTE) SARS-CoV-2 target nucleic acids are NOT DETECTED.  The SARS-CoV-2 RNA is generally detectable in upper and lower respiratory specimens during the acute phase of infection. Negative results do not preclude SARS-CoV-2 infection, do not rule out co-infections with other pathogens, and should not be used as the sole basis for treatment or other Gail Edwards management decisions. Negative results must be combined with clinical observations, Gail Edwards history, and epidemiological information. The expected result is Negative.  Fact Sheet for Patients: HairSlick.no  Fact Sheet for Healthcare Providers: quierodirigir.com  This test is not yet approved or cleared by the [place] FDA and  has been authorized for detection and/or diagnosis of SARS-CoV-2 by FDA under an Emergency Use Authorization (EUA). This EUA will remain  in effect (meaning this test can be used) for the  duration of the COVID-19 declaration under Se ction 564(b)(1) of the Act, 21 U.S.C. section 360bbb-3(b)(1), unless the authorization is terminated or revoked sooner.  Performed at [facility], [address] [place], [place] [address]   MRSA PCR Screening     Status: None   Collection Time: 05/01/20  6:38 AM   Specimen: Nasal Mucosa; Nasopharyngeal  Result Value Ref Range Status   MRSA by PCR NEGATIVE NEGATIVE Final    Comment:        The GeneXpert MRSA Assay (FDA approved for NASAL specimens only), is one component of a comprehensive MRSA colonization surveillance program. It is not intended to diagnose MRSA infection nor to guide or monitor treatment for MRSA infections. Performed at [facility], [address] [place], [place] [address]   Culture, Urine     Status: Abnormal   Collection Time: 05/01/20  4:29 PM   Specimen: Urine, Random  Result Value Ref Range Status   Specimen Description URINE, RANDOM  Final   Special Requests   Final    NONE Performed at [facility], [address] [place], [place] [address]    Culture >=100,000 COLONIES/mL KLEBSIELLA PNEUMONIAE (A)  Final   Report Status 05/04/2020 FINAL  Final   Organism ID, Bacteria KLEBSIELLA PNEUMONIAE (A)  Final      Susceptibility   Klebsiella pneumoniae - MIC*    AMPICILLIN >=32 RESISTANT Resistant     CEFAZOLIN <=4 SENSITIVE Sensitive     CEFEPIME <=0.12 SENSITIVE Sensitive     CEFTRIAXONE <=0.25 SENSITIVE Sensitive     CIPROFLOXACIN <=0.25 SENSITIVE Sensitive     GENTAMICIN <=1 SENSITIVE Sensitive     IMIPENEM <=0.25  SENSITIVE Sensitive     NITROFURANTOIN 128 RESISTANT Resistant     TRIMETH/SULFA <=20 SENSITIVE Sensitive     AMPICILLIN/SULBACTAM 8 SENSITIVE Sensitive     PIP/TAZO <=4 SENSITIVE Sensitive     * >=100,000 COLONIES/mL KLEBSIELLA PNEUMONIAE  Culture, blood (routine x 2)     Status: None   Collection Time: 05/02/20 12:31 AM   Specimen: BLOOD RIGHT HAND  Result  Value Ref Range Status   Specimen Description BLOOD RIGHT HAND  Final   Special Requests   Final    BOTTLES DRAWN AEROBIC AND ANAEROBIC Blood Culture adequate volume   Culture   Final    NO GROWTH 5 DAYS Performed at [facility], [address] [place], [place] [address]    Report Status 05/07/2020 FINAL  Final  Culture, blood (routine x 2)     Status: None   Collection Time: 05/02/20 12:31 AM   Specimen: BLOOD LEFT HAND  Result Value Ref Range Status   Specimen Description BLOOD LEFT HAND  Final   Special Requests   Final    BOTTLES DRAWN AEROBIC AND ANAEROBIC Blood Culture results may not be optimal due to an excessive volume of blood received in culture bottles   Culture   Final    NO GROWTH 5 DAYS Performed at [facility], [address], [place], [place] [address]    Report Status 05/07/2020 FINAL  Final  Acid Fast Smear (AFB)     Status: None   Collection Time: 05/02/20  1:48 PM   Specimen: CSF; Cerebrospinal Fluid  Result Value Ref Range Status   AFB Specimen Processing Comment  Final    Comment: (NOTE) Straight Inoculation without Smear Performed At: [facility] [address] [place], [place] [id number] Jolene Schimke MD DX:[id number]    Acid Fast Smear QNSAFB  Final    Comment: (NOTE) Test not performed. AFB Smear not performed due to specimen source (blood) or insufficient specimen.    Source (AFB) CSF  Final    Comment: Performed at [facility], [address] [place], [place] [address]  Fungus Culture With Stain     Status: None (Preliminary result)   Collection Time: 05/02/20  1:48 PM  Result Value Ref Range Status   Fungus Stain Final report  Final    Comment: (NOTE) Performed At: [facility] [address] [place], [place] [id number] Jolene Schimke MD HU:[id number]    Fungus (Mycology) Culture PENDING  Incomplete   Fungal Source CSF  Final    Comment: Performed at [facility], [address],  [place], [place] [address]  CSF culture w Stat Gram Stain     Status: None   Collection Time: 05/02/20  1:48 PM   Specimen: CSF; Cerebrospinal Fluid  Result Value Ref Range Status   Specimen Description CSF  Final   Special Requests NONE  Final   Gram Stain   Final    WBC PRESENT,BOTH PMN AND MONONUCLEAR NO ORGANISMS SEEN CYTOSPIN SMEAR    Culture   Final    NO GROWTH 3 DAYS Performed at [facility], [address], [place], [place] [address]    Report Status 05/05/2020 FINAL  Final  Fungus Culture Result     Status: None   Collection Time: 05/02/20  1:48 PM  Result Value Ref Range Status   Result 1 Comment  Final    Comment: (NOTE) KOH/Calcofluor preparation:  no fungus observed. Performed At: [facility] [address] [place], [place] [id number] Jolene Schimke MD JT:[id number]      Studies: MR BRAIN W WO CONTRAST  Result Date: 05/06/2020 CLINICAL DATA:  Meningitis. History of recent CSF leak. HIV positive. EXAM: MRI HEAD WITHOUT AND WITH CONTRAST TECHNIQUE: Multiplanar, multiecho pulse sequences of the brain and surrounding structures were obtained without and with intravenous contrast. CONTRAST:  70mL GADAVIST GADOBUTROL 1 MMOL/ML IV SOLN COMPARISON:  MRI head 05/01/2020. FINDINGS: Brain: Multiple small areas of restricted diffusion in the subarachnoid space over the convexity bilaterally have developed since the prior study. Restricted diffusion in the occipital horns bilaterally also has progressed. These findings are most consistent with meningitis. Small areas of restricted diffusion in the right frontal white matter, left putamen, left frontal white matter, right frontal cortex likely are due to small areas of acute infarct. No large territory acute infarct. There is mild meningeal enhancement on the right which is likely related to meningitis. No abscess or subdural fluid collection identified. 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 Negative for  hydrocephalus. Negative for intracranial hemorrhage or mass lesion. Vascular: Normal arterial flow voids. Skull and upper cervical spine: No focal skeletal lesion. Sinuses/Orbits: Mucosal edema in the paranasal sinuses. Bilateral mastoid effusion. Other: None IMPRESSION: Interval development of small areas of restricted diffusion in the subarachnoid space over the convexity bilaterally compatible with meningitis. Progressive restricted diffusion in the occipital horns bilaterally compatible with meningitis. No hydrocephalus or abscess. 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 Mild meningeal enhancement on the right likely related to meningitis. Empty sella.  This could be a source of the CSF leak. Electronically Signed   By: Marlan Palau M.D.   On: 05/06/2020 10:40      Joycelyn Das, MD  [facility] 05/07/2020  If 7PM-7AM, please contact night-coverage

## 2020-05-07 NOTE — Progress Notes (Signed)
PHARMACY CONSULT NOTE FOR:  OUTPATIENT  PARENTERAL ANTIBIOTIC THERAPY (OPAT)  Indication: Meningitis  Regimen: ceftriaxone 2g IV q12h End date: 05/15/2020  IV antibiotic discharge orders are pended. To discharging provider:  please sign these orders via discharge navigator,  Select New Orders & click on the button choice - Manage This Unsigned Work.     Thank you for allowing pharmacy to be a part of this patient's care.  Jeannetta Nap 05/07/2020, 11:52 AM

## 2020-05-08 LAB — CBC
HCT: 33 % — ABNORMAL LOW (ref 36.0–46.0)
Hemoglobin: 11 g/dL — ABNORMAL LOW (ref 12.0–15.0)
MCH: 29.6 pg (ref 26.0–34.0)
MCHC: 33.3 g/dL (ref 30.0–36.0)
MCV: 88.9 fL (ref 80.0–100.0)
Platelets: 268 10*3/uL (ref 150–400)
RBC: 3.71 MIL/uL — ABNORMAL LOW (ref 3.87–5.11)
RDW: 14.5 % (ref 11.5–15.5)
WBC: 9.5 10*3/uL (ref 4.0–10.5)
nRBC: 0 % (ref 0.0–0.2)

## 2020-05-08 LAB — BASIC METABOLIC PANEL
Anion gap: 6 (ref 5–15)
BUN: 16 mg/dL (ref 8–23)
CO2: 27 mmol/L (ref 22–32)
Calcium: 8.2 mg/dL — ABNORMAL LOW (ref 8.9–10.3)
Chloride: 106 mmol/L (ref 98–111)
Creatinine, Ser: 0.62 mg/dL (ref 0.44–1.00)
GFR, Estimated: >60 mL/min (ref >60)
Glucose, Bld: 110 mg/dL — ABNORMAL HIGH (ref 70–99)
Potassium: 3.6 mmol/L (ref 3.5–5.1)
Sodium: 139 mmol/L (ref 135–145)

## 2020-05-08 LAB — HSV 1/2 AB IGG/IGM CSF
HSV 1/2 Ab Screen IgG, CSF: 9.24 IV — ABNORMAL HIGH (ref <=0.89)
HSV 1/2 Ab, IgM, CSF: 0.49 IV (ref <=0.89)

## 2020-05-08 LAB — MAGNESIUM: Magnesium: 2 mg/dL (ref 1.7–2.4)

## 2020-05-08 LAB — GLUCOSE, CAPILLARY
Glucose-Capillary: 106 mg/dL — ABNORMAL HIGH (ref 70–99)
Glucose-Capillary: 102 mg/dL — ABNORMAL HIGH (ref 70–99)

## 2020-05-08 MED ORDER — HEPARIN SOD (PORK) LOCK FLUSH 100 UNIT/ML IV SOLN: 250.0000 [IU] | Freq: Every day | INTRAVENOUS | Status: DC

## 2020-05-08 MED ORDER — LOSARTAN POTASSIUM 50 MG PO TABS: 50.0000 mg | ORAL_TABLET | Freq: Every day | ORAL | 2 refills | Status: DC

## 2020-05-08 MED ORDER — SENNOSIDES-DOCUSATE SODIUM 8.6-50 MG PO TABS: 1.0000 | ORAL_TABLET | Freq: Every evening | ORAL | Status: DC | PRN

## 2020-05-08 MED ORDER — HEPARIN SOD (PORK) LOCK FLUSH 100 UNIT/ML IV SOLN
250.0000 [IU] | INTRAVENOUS | Status: DC | PRN
  Administered 2020-05-08: 250 [IU]

## 2020-05-08 MED ORDER — ACETAMINOPHEN 325 MG PO TABS: 325.0000 mg | ORAL_TABLET | ORAL | Status: DC | PRN

## 2020-05-08 MED ORDER — ADULT MULTIVITAMIN W/MINERALS CH: 1.0000 | ORAL_TABLET | Freq: Every day | ORAL | Status: AC

## 2020-05-08 NOTE — Discharge Summary (Signed)
Physician Discharge Summary  Gail Edwards TJQ:[id number] DOB: 02/20/1956 DOA: 04/30/2020  PCP: Leeroy Cha, MD  Admit date: 04/30/2020 Discharge date: 05/08/2020  Admitted From: Home  Discharge disposition: Home  Recommendations for Outpatient Follow-Up:   . Follow up with your primary care provider in one week.  . Check CBC, BMP, magnesium in the next visit . Follow-up with infectious disease as scheduled  . Follow-up with [facility] to be scheduled by the clinic.  Discharge Diagnosis:   Principal Problem:   Meningitis Active Problems:   HIV disease ([place])   Abnormal MRI   Cerebral ventriculitis   Encounter for intubation   Encounter for orogastric (OG) tube placement   Discharge Condition: Improved.  Diet recommendation: Low sodium, heart healthy.    Wound care: None.  Code status: Full.   History of Present Illness:   Patient is a 65 years old female with past medical history of HIV presented to hospital on 04/30/2020 with garbled speech and left facial droop.  She was initially treated with TPA for possible stroke.  On 05/02/2019 patient developed worsening confusion.  MRI of the brain was performed which showed a small intraventricular hemorrhage versus possible ventriculitis.  PCCM was consulted due to progressive decline in mental status and patient needing intubation.  Subsequently, ID was consulted.  Patient was given IV antibiotics after lumbar puncture was highly suspected for bacterial meningitis.  Symptoms have significantly improved at this time.  She was seen by physical therapy who recommended CIR.  Hospital Course:   Following conditions were addressed during hospitalization as listed below,  Acute metabolicencephalopathydue to culture-negative acute bacterial meningitis/ventriculitis Metabolic encephalopathy resolved subsequently..  LP performed was consistent with acute bacterial meningitis.  Lumbar puncture culture fluid  was however negative.  ID recommends ceftriaxone through 3/23.    Received PICC line placement for antibiotics.  Patient completed IV steroids for bacterial meningitis during hospitalization.     A 2D echocardiogram was performed which did not show any vegetations.  Toxoplasma IgG positive. Cryptococcal serum antigen negative.  HIV RNA less than 20.  CD4 count 121 on 05/02/2020.  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  Neurosurgery was consulted and recommended conservative treatment.  At this time, ID has recommended outpatient antibiotic and follow-up.  Patient is stable for disposition.  Acute respiratory failure with hypoxia Resolved.  Patient was intubated for worsening mental status.  Currently on room air after extubation.    Hyponatremia resolved.    Sodium level prior to discharge was 139  Elevated blood pressure.  History of hypertension.  Not on medication as outpatient.  Losartan was initiated with overall improved blood pressure.  Losartan will be continued on discharge.  Will need follow-up with primary care physician.  HIV Followed by Dr. Johnnye Sima outpatient.  Continue Emtricitabine-Rilpivir-Tenofovir .  Infectious disease the patient during hospitalization.  Will follow up with ID on discharge .  Prediabetes.  Latest A1c of 5.7.  Not on medications at home.  Morbid obesity Would benefit from ongoing weight loss as outpatient.  Debility, deconditioning.  Patient has been seen by physical therapy who initially recommended CIR.   Patient did reasonably well so was finally considered for outpatient PT on discharge.  Disposition.  At this time, patient is stable for disposition home with outpatient PCP, infectious disease follow-up.  Will also follow-up with [facility].  Medical Consultants:    PCCM  Infectious disease  Neurology  Procedures:     NG tube  placement,  Intubation and mechanical ventilation  Lumbar puncture  Subjective:   Today,  patient was seen and examined at bedside.  Denies any nausea, vomiting, headache, fever or chills  Discharge Exam:   Vitals:   05/08/20 0339 05/08/20 0700  BP: (!) 143/74 (!) 158/66  Pulse: 68 84  Resp: 17 16  Temp: 97.7 F (36.5 C) 98 F (36.7 C)  SpO2: 97% 98%   Vitals:   05/07/20 2350 05/08/20 0339 05/08/20 0500 05/08/20 0700  BP: 136/71 (!) 143/74  (!) 158/66  Pulse: 68 68  84  Resp: 17 17  16   Temp: 97.7 F (36.5 C) 97.7 F (36.5 C)  98 F (36.7 C)  TempSrc: Oral Oral  Oral  SpO2: 99% 97%  98%  Weight:   113.9 kg   Height:       General: Alert awake, not in obvious distress, obese built HENT: pupils equally reacting to light,  No scleral pallor or icterus noted. Oral mucosa is moist.  Chest:  Clear breath sounds.  Diminished breath sounds bilaterally. No crackles or wheezes.  CVS: S1 &S2 heard. No murmur.  Regular rate and rhythm. Abdomen: Soft, nontender, nondistended.  Bowel sounds are heard.   Extremities: No cyanosis, clubbing or edema.  Peripheral pulses are palpable. Psych: Alert, awake and oriented, normal mood CNS:  No cranial nerve deficits.  Power equal in all extremities.   Skin: Warm and dry.  No rashes noted.  The results of significant diagnostics from this hospitalization (including imaging, microbiology, ancillary and laboratory) are listed below for reference.     Diagnostic Studies:   CT ANGIO HEAD W OR WO CONTRAST  Result Date: 04/30/2020 CLINICAL DATA:  Headache and left facial droop EXAM: CT ANGIOGRAPHY HEAD AND NECK TECHNIQUE: Multidetector CT imaging of the head and neck was performed using the standard protocol during bolus administration of intravenous contrast. Multiplanar CT image reconstructions and MIPs were obtained to evaluate the vascular anatomy. Carotid stenosis measurements (when applicable) are obtained utilizing NASCET criteria, using the distal internal carotid diameter as the denominator. CONTRAST:  50m OMNIPAQUE IOHEXOL 350  MG/ML SOLN COMPARISON:  None. FINDINGS: CTA NECK FINDINGS SKELETON: There is no bony spinal canal stenosis. No lytic or blastic lesion. OTHER NECK: Normal pharynx, larynx and major salivary glands. No cervical lymphadenopathy. Enlarged right thyroid lobe. UPPER CHEST: No pneumothorax or pleural effusion. No nodules or masses. AORTIC ARCH: There is no calcific atherosclerosis of the aortic arch. There is no aneurysm, dissection or hemodynamically significant stenosis of the visualized portion of the aorta. Conventional 3 vessel aortic branching pattern. The visualized proximal subclavian arteries are widely patent. RIGHT CAROTID SYSTEM: Normal without aneurysm, dissection or stenosis. LEFT CAROTID SYSTEM: No dissection, occlusion or aneurysm. Mild atherosclerotic calcification at the carotid bifurcation without hemodynamically significant stenosis. VERTEBRAL ARTERIES: Left dominant configuration. Both origins are clearly patent. There is no dissection, occlusion or flow-limiting stenosis to the skull base (V1-V3 segments). CTA HEAD FINDINGS POSTERIOR CIRCULATION: --Vertebral arteries: Normal V4 segments. --Inferior cerebellar arteries: Normal. --Basilar artery: Normal. --Superior cerebellar arteries: Normal. --Posterior cerebral arteries (PCA): Normal. ANTERIOR CIRCULATION: --Intracranial internal carotid arteries: Normal. --Anterior cerebral arteries (ACA): Normal. Both A1 segments are present. Patent anterior communicating artery (a-comm). --Middle cerebral arteries (MCA): Normal. VENOUS SINUSES: As permitted by contrast timing, patent. ANATOMIC VARIANTS: None Review of the MIP images confirms the above findings. IMPRESSION: 1. No intracranial arterial occlusion or high-grade stenosis. 2. Enlarged right thyroid lobe. Recommend thyroid ultrasound (ref: J Am Coll Radiol.  2015 Feb;12(2): 143-50). Electronically Signed   By: Ulyses Jarred M.D.   On: 04/30/2020 21:48   CT HEAD WO CONTRAST  Addendum Date: 05/02/2020    ADDENDUM REPORT: 05/02/2020 07:22 ADDENDUM: Study discussed by telephone with Dr. Amie Portland on 05/02/2020 at 0715 hours. He and I both note the abnormal appearance of the occipital horns on MRI yesterday, and on this CT the horns - which were readily visible at 0753 hours (series 3 image 13 on that exam) - are no longer evident and therefore likely opacified with isodense material. We discussed suspicion of meningitis/ventriculitis with debris more so than intraventricular blood. This patient has underlying HIV, and her WBC is increasing. CSF sampling and analysis is planned. Electronically Signed   By: Genevie Ann M.D.   On: 05/02/2020 07:22   Result Date: 05/02/2020 CLINICAL DATA:  Stroke follow-up. EXAM: CT HEAD WITHOUT CONTRAST TECHNIQUE: Contiguous axial images were obtained from the base of the skull through the vertex without intravenous contrast. COMPARISON:  May 01, 2020 (7:52 a.m.) FINDINGS: Brain: No evidence of acute infarction, hemorrhage, hydrocephalus, extra-axial collection or mass lesion/mass effect. Vascular: No hyperdense vessel or unexpected calcification. Skull: Normal. Negative for fracture or focal lesion. Sinuses/Orbits: Mild right maxillary sinus and bilateral ethmoid sinus mucosal thickening is seen. Other: Endotracheal and orogastric tubes are in place. IMPRESSION: Stable exam without acute intracranial abnormality. Electronically Signed: By: Virgina Norfolk M.D. On: 05/01/2020 23:00   CT HEAD WO CONTRAST  Result Date: 05/01/2020 CLINICAL DATA:  Status post tPA administration for CVA. Frontal headache. EXAM: CT HEAD WITHOUT CONTRAST TECHNIQUE: Contiguous axial images were obtained from the base of the skull through the vertex without intravenous contrast. COMPARISON:  April 30, 2020 FINDINGS: Brain: Ventricles and sulci are normal in size and configuration. There is stable invagination of CSF into the sella. There is no intracranial mass, hemorrhage, extra-axial fluid collection,  or midline shift. Brain parenchyma appears unremarkable. No acute infarct is demonstrable. Vascular: No hyperdense vessel. No appreciable vascular calcification. Skull: Bony calvarium appears intact. Sinuses/Orbits: There is mucosal thickening in the right maxillary antrum. There is mucosal thickening and opacification in several ethmoid air cells. Orbits appear symmetric bilaterally. Other: Mastoid air cells are clear. IMPRESSION: 1. Normal appearing brain parenchyma. No acute infarct evident. No mass or hemorrhage. There is a degree of empty sella, a stable finding of questionable significance. 2.  There are foci of paranasal sinus disease. Electronically Signed   By: Lowella Grip III M.D.   On: 05/01/2020 08:03   CT ANGIO NECK W OR WO CONTRAST  Result Date: 04/30/2020 CLINICAL DATA:  Headache and left facial droop EXAM: CT ANGIOGRAPHY HEAD AND NECK TECHNIQUE: Multidetector CT imaging of the head and neck was performed using the standard protocol during bolus administration of intravenous contrast. Multiplanar CT image reconstructions and MIPs were obtained to evaluate the vascular anatomy. Carotid stenosis measurements (when applicable) are obtained utilizing NASCET criteria, using the distal internal carotid diameter as the denominator. CONTRAST:  77m OMNIPAQUE IOHEXOL 350 MG/ML SOLN COMPARISON:  None. FINDINGS: CTA NECK FINDINGS SKELETON: There is no bony spinal canal stenosis. No lytic or blastic lesion. OTHER NECK: Normal pharynx, larynx and major salivary glands. No cervical lymphadenopathy. Enlarged right thyroid lobe. UPPER CHEST: No pneumothorax or pleural effusion. No nodules or masses. AORTIC ARCH: There is no calcific atherosclerosis of the aortic arch. There is no aneurysm, dissection or hemodynamically significant stenosis of the visualized portion of the aorta. Conventional 3 vessel aortic branching pattern.  The visualized proximal subclavian arteries are widely patent. RIGHT CAROTID  SYSTEM: Normal without aneurysm, dissection or stenosis. LEFT CAROTID SYSTEM: No dissection, occlusion or aneurysm. Mild atherosclerotic calcification at the carotid bifurcation without hemodynamically significant stenosis. VERTEBRAL ARTERIES: Left dominant configuration. Both origins are clearly patent. There is no dissection, occlusion or flow-limiting stenosis to the skull base (V1-V3 segments). CTA HEAD FINDINGS POSTERIOR CIRCULATION: --Vertebral arteries: Normal V4 segments. --Inferior cerebellar arteries: Normal. --Basilar artery: Normal. --Superior cerebellar arteries: Normal. --Posterior cerebral arteries (PCA): Normal. ANTERIOR CIRCULATION: --Intracranial internal carotid arteries: Normal. --Anterior cerebral arteries (ACA): Normal. Both A1 segments are present. Patent anterior communicating artery (a-comm). --Middle cerebral arteries (MCA): Normal. VENOUS SINUSES: As permitted by contrast timing, patent. ANATOMIC VARIANTS: None Review of the MIP images confirms the above findings. IMPRESSION: 1. No intracranial arterial occlusion or high-grade stenosis. 2. Enlarged right thyroid lobe. Recommend thyroid ultrasound (ref: J Am Coll Radiol. 2015 Feb;12(2): 143-50). Electronically Signed   By: Ulyses Jarred M.D.   On: 04/30/2020 21:48   MR BRAIN WO CONTRAST  Result Date: 05/01/2020 CLINICAL DATA:  Stroke follow-up. EXAM: MRI HEAD WITHOUT CONTRAST TECHNIQUE: Multiplanar, multiecho pulse sequences of the brain and surrounding structures were obtained without intravenous contrast. COMPARISON:  Head CT May 01, 2020 FINDINGS: Brain: No acute infarction, hydrocephalus or mass lesion. Fluid level within the occipital horn of the bilateral lateral ventricles with low signal on T2 and associated restricted diffusion. There is also hyperintensity of the cerebral sulci in the parietooccipital region. Vascular: Normal flow voids. Skull and upper cervical spine: Cervical spine is obscured by artifact. Normal marrow  signal in the calvarium. Sinuses/Orbits: Mucosal thickening of the right maxillary sinus and bilateral ethmoid cells. IMPRESSION: Fluid level within the occipital horn of the bilateral lateral ventricles with low signal on T2 and associated restricted diffusion. Findings are suggestive of small intraventricular hemorrhage. However, the possibility of ventriculitis CT be excluded. CSF sampling may be helpful for further evaluation. These results were called by telephone at the time of interpretation on 05/01/2020 at 11:56 am to provider [facility] , who verbally acknowledged these results. Electronically Signed   By: Pedro Earls M.D.   On: 05/01/2020 11:55   DG Chest Port 1 View  Result Date: 05/01/2020 CLINICAL DATA:  Hump of adipose tissue on her upper back. EXAM: PORTABLE CHEST 1 VIEW COMPARISON:  May 01, 2020 FINDINGS: An endotracheal tube is seen with its distal tip approximately 1.8 cm from the carina. A nasogastric tube is noted with its distal tip overlying the expected region of the gastric fundus. Mild, chronic appearing increased lung markings are seen with mild areas of atelectasis noted along the lateral aspect of the left lung base. There is no evidence of a pleural effusion or pneumothorax. Perihilar prominence of the pulmonary vasculature is seen. The heart size and mediastinal contours are within normal limits. Degenerative changes are seen throughout the thoracic spine. IMPRESSION: 1. Endotracheal tube and nasogastric tube in appropriate position. 2. Mild pulmonary vascular congestion with mild left basilar atelectasis. Electronically Signed   By: Virgina Norfolk M.D.   On: 05/01/2020 14:53   DG CHEST PORT 1 VIEW  Result Date: 05/01/2020 CLINICAL DATA:  History of abnormal MRI. EXAM: PORTABLE CHEST 1 VIEW COMPARISON:  08/16/2019. FINDINGS: Limited exam as patient would not hold still for imaging. Mediastinal prominence, most likely related to AP lordotic supine  projection. Heart size normal. Mild pulmonary venous congestion. Low lung volumes. Mild bilateral interstitial prominence cannot be excluded.  Mild interstitial edema and/or pneumonitis cannot be excluded. Tiny left pleural effusion cannot be excluded. No pneumothorax. Surgical clips right upper quadrant. No acute bony abnormality. Postsurgical changes right shoulder. IMPRESSION: Limited exam as patient would not hold still for imaging. Heart size normal. Mild pulmonary venous congestion. Low lung volumes. Mild bilateral interstitial prominence cannot be excluded. Mild interstitial edema and/or pneumonitis cannot be excluded. Tiny left pleural effusion cannot be excluded. Electronically Signed   By: Marcello Moores  Register   On: 05/01/2020 06:53   ECHOCARDIOGRAM COMPLETE  Result Date: 05/01/2020    ECHOCARDIOGRAM REPORT   Patient Name:   SELEN SMUCKER Date of Exam: 05/01/2020 Medical Rec #:  [id number]      Height:       66.0 in Accession #:    [id number]     Weight:       232.1 lb Date of Birth:  [birth year]      BSA:          2.131 m Patient Age:    9 years       BP:           160/74 mmHg Patient Gender: F              HR:           82 bpm. Exam Location:  Inpatient Procedure: 2D Echo, Cardiac Doppler, Color Doppler and Intracardiac            Opacification Agent Indications:    Stroke  History:        Patient has no prior history of Echocardiogram examinations.                 Risk Factors:Hypertension and Dyslipidemia. HIV.  Sonographer:    Clayton Lefort RDCS (AE) Referring Phys: [place]  1. Left ventricular ejection fraction, by estimation, is 55 to 60%. The left ventricle has normal function. The left ventricle has no regional wall motion abnormalities. There is mild concentric left ventricular hypertrophy. Left ventricular diastolic parameters were normal.  2. Right ventricular systolic function is normal. The right ventricular size is normal. There is normal pulmonary artery systolic pressure.   3. The mitral valve is normal in structure. Trivial mitral valve regurgitation. No evidence of mitral stenosis.  4. The aortic valve is tricuspid. There is mild calcification of the aortic valve. Aortic valve regurgitation is not visualized. Mild aortic valve sclerosis is present, with no evidence of aortic valve stenosis. Comparison(s): No prior Echocardiogram. Conclusion(s)/Recommendation(s): Normal biventricular function without evidence of hemodynamically significant valvular heart disease. No intracardiac source of embolism detected on this transthoracic study. A transesophageal echocardiogram is recommended to exclude cardiac source of embolism if clinically indicated. FINDINGS  Left Ventricle: Left ventricular ejection fraction, by estimation, is 55 to 60%. The left ventricle has normal function. The left ventricle has no regional wall motion abnormalities. Definity contrast agent was given IV to delineate the left ventricular  endocardial borders. The left ventricular internal cavity size was normal in size. There is mild concentric left ventricular hypertrophy. Left ventricular diastolic parameters were normal. Right Ventricle: The right ventricular size is normal. No increase in right ventricular wall thickness. Right ventricular systolic function is normal. There is normal pulmonary artery systolic pressure. The tricuspid regurgitant velocity is 2.32 m/s, and  with an assumed right atrial pressure of 8 mmHg, the estimated right ventricular systolic pressure is 83.2 mmHg. Left Atrium: Left atrial size was normal in size. Right Atrium: Right atrial size  was normal in size. Pericardium: There is no evidence of pericardial effusion. Presence of pericardial fat pad. Mitral Valve: The mitral valve is normal in structure. Trivial mitral valve regurgitation. No evidence of mitral valve stenosis. MV peak gradient, 6.4 mmHg. The mean mitral valve gradient is 2.0 mmHg. Tricuspid Valve: The tricuspid valve is normal  in structure. Tricuspid valve regurgitation is trivial. No evidence of tricuspid stenosis. Aortic Valve: The aortic valve is tricuspid. There is mild calcification of the aortic valve. Aortic valve regurgitation is not visualized. Mild aortic valve sclerosis is present, with no evidence of aortic valve stenosis. Aortic valve mean gradient measures 7.0 mmHg. Aortic valve peak gradient measures 12.5 mmHg. Aortic valve area, by VTI measures 2.45 cm. Pulmonic Valve: The pulmonic valve was not well visualized. Pulmonic valve regurgitation is not visualized. Aorta: The aortic root, ascending aorta and aortic arch are all structurally normal, with no evidence of dilitation or obstruction. Venous: IVC assessment for right atrial pressure unable to be performed due to mechanical ventilation. IAS/Shunts: The atrial septum is grossly normal.  LEFT VENTRICLE PLAX 2D LVIDd:         4.10 cm  Diastology LVIDs:         2.80 cm  LV e' medial:    7.94 cm/s LV PW:         1.30 cm  LV E/e' medial:  10.4 LV IVS:        1.40 cm  LV e' lateral:   10.10 cm/s LVOT diam:     2.10 cm  LV E/e' lateral: 8.2 LV SV:         80 LV SV Index:   38 LVOT Area:     3.46 cm  RIGHT VENTRICLE             IVC RV Basal diam:  2.80 cm     IVC diam: 2.20 cm RV S prime:     14.30 cm/s TAPSE (M-mode): 2.4 cm LEFT ATRIUM             Index       RIGHT ATRIUM           Index LA diam:        3.80 cm 1.78 cm/m  RA Area:     13.60 cm LA Vol (A2C):   55.1 ml 25.86 ml/m RA Volume:   30.70 ml  14.41 ml/m LA Vol (A4C):   56.8 ml 26.66 ml/m LA Biplane Vol: 56.3 ml 26.42 ml/m  AORTIC VALVE AV Area (Vmax):    2.62 cm AV Area (Vmean):   2.40 cm AV Area (VTI):     2.45 cm AV Vmax:           177.00 cm/s AV Vmean:          126.000 cm/s AV VTI:            0.328 m AV Peak Grad:      12.5 mmHg AV Mean Grad:      7.0 mmHg LVOT Vmax:         134.00 cm/s LVOT Vmean:        87.300 cm/s LVOT VTI:          0.232 m LVOT/AV VTI ratio: 0.71  AORTA Ao Root diam: 3.10 cm Ao Asc  diam:  3.40 cm MITRAL VALVE                TRICUSPID VALVE MV Area (PHT): 3.27 cm     TR  Peak grad:   21.5 mmHg MV Area VTI:   2.31 cm     TR Vmax:        232.00 cm/s MV Peak grad:  6.4 mmHg MV Mean grad:  2.0 mmHg     SHUNTS MV Vmax:       1.26 m/s     Systemic VTI:  0.23 m MV Vmean:      65.6 cm/s    Systemic Diam: 2.10 cm MV Decel Time: 232 msec MV E velocity: 82.50 cm/s MV A velocity: 101.00 cm/s MV E/A ratio:  0.82 Buford Dresser MD Electronically signed by Buford Dresser MD Signature Date/Time: 05/01/2020/8:15:58 PM    Final    US THYROID  Result Date: 05/01/2020 CLINICAL DATA:  Thyromegaly EXAM: THYROID ULTRASOUND TECHNIQUE: Ultrasound examination of the thyroid gland and adjacent soft tissues was performed. COMPARISON:  None. FINDINGS: Parenchymal Echotexture: Moderately heterogeneous Isthmus: 1.2 cm Right lobe: 5.4 x 3.1 x 2.6 cm Left lobe: 4.5 x 2.2 x 1.8 cm _________________________________________________________ Estimated total number of nodules >/= 1 cm: 2 Number of spongiform nodules >/=  2 cm not described below (TR1): 0 Number of mixed cystic and solid nodules >/= 1.5 cm not described below (TR2): 0 _________________________________________________________ Nodule # 1: Location: Right; inferior Maximum size: 4.4 cm; Other 2 dimensions: 3.2 x 2.7 cm Composition: solid/almost completely solid (2) Echogenicity: hypoechoic (2) Shape: taller-than-wide (3) Margins: smooth (0) Echogenic foci: none (0) ACR TI-RADS total points: 7. ACR TI-RADS risk category: TR5 (>/= 7 points). ACR TI-RADS recommendations: **Given size (>/= 1.0 cm) and appearance, fine needle aspiration of this highly suspicious nodule should be considered based on TI-RADS criteria. _________________________________________________________ Nodule # 2: Location: Left; mid Maximum size: 1.0 cm; Other 2 dimensions: 0.9 x 0.7 cm Composition: solid/almost completely solid (2) Echogenicity: hyperechoic (1) Shape: not  taller-than-wide (0) Margins: smooth (0) Echogenic foci: None ACR TI-RADS total points: 3. ACR TI-RADS risk category: TR3 (3 points). ACR TI-RADS recommendations: Given size (<1.4 cm) and appearance, this nodule does NOT meet TI-RADS criteria for biopsy or dedicated follow-up. _________________________________________________________ IMPRESSION: Nodule 1 (TI-RADS 5) located in the inferior right thyroid lobe, measuring 4.4 x 3.2 x 2.7 cm, meets criteria for FNA. The above is in keeping with the ACR TI-RADS recommendations - J Am Coll Radiol 2017;14:587-595. Electronically Signed   By: Miachel Roux M.D.   On: 05/01/2020 13:54   CT HEAD CODE STROKE WO CONTRAST  Result Date: 04/30/2020 CLINICAL DATA:  Code stroke.  Facial droop, left.  Headache. EXAM: CT HEAD WITHOUT CONTRAST TECHNIQUE: Contiguous axial images were obtained from the base of the skull through the vertex without intravenous contrast. COMPARISON:  None. FINDINGS: Brain: There is no mass, hemorrhage or extra-axial collection. The size and configuration of the ventricles and extra-axial CSF spaces are normal. The brain parenchyma is normal, without evidence of acute or chronic infarction. Vascular: No abnormal hyperdensity of the major intracranial arteries or dural venous sinuses. No intracranial atherosclerosis. Skull: The visualized skull base, calvarium and extracranial soft tissues are normal. Sinuses/Orbits: No fluid levels or advanced mucosal thickening of the visualized paranasal sinuses. No mastoid or middle ear effusion. The orbits are normal. ASPECTS [facility] Stroke Program Early CT Score) - Ganglionic level infarction (caudate, lentiform nuclei, internal capsule, insula, M1-M3 cortex): 7 - Supraganglionic infarction (M4-M6 cortex): 3 Total score (0-10 with 10 being normal): 10 IMPRESSION: 1. Normal head CT. 2. ASPECTS is 10. These results were communicated to Dr. Kerney Elbe at 9:20 pm on 04/30/2020  by text page via the [facility] messaging system.  Electronically Signed   By: Ulyses Jarred M.D.   On: 04/30/2020 21:20     Labs:   Basic Metabolic Panel: Recent Labs  Lab 05/01/20 1405 05/01/20 1418 05/01/20 1700 05/02/20 0031 05/02/20 1637 05/03/20 0433 05/04/20 0421 05/07/20 0423 05/08/20 0500  NA  --    < >  --  134*  --  139 145 137 139  K  --    < >  --  3.7  --  3.6 3.5 3.5 3.6  CL  --   --   --  106  --  111 114* 107 106  CO2  --   --   --  19*  --  20* 22 24 27   GLUCOSE  --   --   --  237*  --  220* 133* 103* 110*  BUN  --   --   --  13  --  26* 26* 24* 16  CREATININE  --   --   --  0.87  --  0.78 0.72 0.63 0.62  CALCIUM  --   --   --  8.6*  --  8.6* 8.5* 8.1* 8.2*  MG 1.8  --  1.8 1.9 2.4  --   --  2.1 2.0  PHOS 1.9*  --  2.8 2.7 1.9*  --  2.7  --   --    < > = values in this interval not displayed.   GFR Estimated Creatinine Clearance: 92.2 mL/min (by C-G formula based on SCr of 0.62 mg/dL). Liver Function Tests: Recent Labs  Lab 05/02/20 0031  AST 14*  ALT 15  ALKPHOS 70  BILITOT 0.6  PROT 6.2*  ALBUMIN 3.1*   No results for input(s): LIPASE, AMYLASE in the last 168 hours. Recent Labs  Lab 05/01/20 1028  AMMONIA 33   Coagulation profile Recent Labs  Lab 05/02/20 0752  INR 1.3*    CBC: Recent Labs  Lab 05/01/20 1418 05/02/20 0031 05/05/20 0055 05/07/20 0423 05/08/20 0500  WBC  --  14.6* 8.4 8.3 9.5  HGB 12.2 11.8* 11.6* 11.3* 11.0*  HCT 36.0 34.2* 34.6* 34.0* 33.0*  MCV  --  87.7 88.0 88.8 88.9  PLT  --  238 310 273 268   Cardiac Enzymes: No results for input(s): CKTOTAL, CKMB, CKMBINDEX, TROPONINI in the last 168 hours. BNP: Invalid input(s): POCBNP CBG: Recent Labs  Lab 05/07/20 1955 05/07/20 2140 05/07/20 2348 05/08/20 0337 05/08/20 0714  GLUCAP 144* 118* 109* 106* 102*   D-Dimer No results for input(s): DDIMER in the last 72 hours. Hgb A1c No results for input(s): HGBA1C in the last 72 hours. Lipid Profile No results for input(s): CHOL, HDL, LDLCALC, TRIG,  CHOLHDL, LDLDIRECT in the last 72 hours. Thyroid function studies No results for input(s): TSH, T4TOTAL, T3FREE, THYROIDAB in the last 72 hours.  Invalid input(s): FREET3 Anemia work up No results for input(s): VITAMINB12, FOLATE, FERRITIN, TIBC, IRON, RETICCTPCT in the last 72 hours. Microbiology Recent Results (from the past 240 hour(s))  SARS CORONAVIRUS 2 (TAT 6-24 HRS) Nasopharyngeal Nasopharyngeal Swab     Status: None   Collection Time: 04/30/20 10:54 PM   Specimen: Nasopharyngeal Swab  Result Value Ref Range Status   SARS Coronavirus 2 NEGATIVE NEGATIVE Final    Comment: (NOTE) SARS-CoV-2 target nucleic acids are NOT DETECTED.  The SARS-CoV-2 RNA is generally detectable in upper and lower respiratory specimens during the acute phase of infection. Negative  results do not preclude SARS-CoV-2 infection, do not rule out co-infections with other pathogens, and should not be used as the sole basis for treatment or other patient management decisions. Negative results must be combined with clinical observations, patient history, and epidemiological information. The expected result is Negative.  Fact Sheet for Patients: SugarRoll.be  Fact Sheet for Healthcare Providers: https://www.woods-mathews.com/  This test is not yet approved or cleared by the [place] FDA and  has been authorized for detection and/or diagnosis of SARS-CoV-2 by FDA under an Emergency Use Authorization (EUA). This EUA will remain  in effect (meaning this test can be used) for the duration of the COVID-19 declaration under Se ction 564(b)(1) of the Act, 21 U.S.C. section 360bbb-3(b)(1), unless the authorization is terminated or revoked sooner.  Performed at [facility], [place] [address], [place], [place] [address]   MRSA PCR Screening     Status: None   Collection Time: 05/01/20  6:38 AM   Specimen: Nasal Mucosa; Nasopharyngeal  Result Value Ref Range  Status   MRSA by PCR NEGATIVE NEGATIVE Final    Comment:        The GeneXpert MRSA Assay (FDA approved for NASAL specimens only), is one component of a comprehensive MRSA colonization surveillance program. It is not intended to diagnose MRSA infection nor to guide or monitor treatment for MRSA infections. Performed at [facility], [place] [address], [place], [place] [address]   Culture, Urine     Status: Abnormal   Collection Time: 05/01/20  4:29 PM   Specimen: Urine, Random  Result Value Ref Range Status   Specimen Description URINE, RANDOM  Final   Special Requests   Final    NONE Performed at [facility], [place] [address] [place], [place] [address]    Culture >=100,000 COLONIES/mL KLEBSIELLA PNEUMONIAE (A)  Final   Report Status 05/04/2020 FINAL  Final   Organism ID, Bacteria KLEBSIELLA PNEUMONIAE (A)  Final      Susceptibility   Klebsiella pneumoniae - MIC*    AMPICILLIN >=32 RESISTANT Resistant     CEFAZOLIN <=4 SENSITIVE Sensitive     CEFEPIME <=0.12 SENSITIVE Sensitive     CEFTRIAXONE <=0.25 SENSITIVE Sensitive     CIPROFLOXACIN <=0.25 SENSITIVE Sensitive     GENTAMICIN <=1 SENSITIVE Sensitive     IMIPENEM <=0.25 SENSITIVE Sensitive     NITROFURANTOIN 128 RESISTANT Resistant     TRIMETH/SULFA <=20 SENSITIVE Sensitive     AMPICILLIN/SULBACTAM 8 SENSITIVE Sensitive     PIP/TAZO <=4 SENSITIVE Sensitive     * >=100,000 COLONIES/mL KLEBSIELLA PNEUMONIAE  Culture, blood (routine x 2)     Status: None   Collection Time: 05/02/20 12:31 AM   Specimen: BLOOD RIGHT HAND  Result Value Ref Range Status   Specimen Description BLOOD RIGHT HAND  Final   Special Requests   Final    BOTTLES DRAWN AEROBIC AND ANAEROBIC Blood Culture adequate volume   Culture   Final    NO GROWTH 5 DAYS Performed at [facility], [address], [place], [place] [address]    Report Status 05/07/2020 FINAL  Final  Culture, blood (routine x 2)     Status: None   Collection Time:  05/02/20 12:31 AM   Specimen: BLOOD LEFT HAND  Result Value Ref Range Status   Specimen Description BLOOD LEFT HAND  Final   Special Requests   Final    BOTTLES DRAWN AEROBIC AND ANAEROBIC Blood Culture results may  not be optimal due to an excessive volume of blood received in culture bottles   Culture   Final    NO GROWTH 5 DAYS Performed at [facility], [place] [address] [place], [place] [address]    Report Status 05/07/2020 FINAL  Final  Blastomyces Antigen     Status: None   Collection Time: 05/02/20 11:08 AM   Specimen: Blood  Result Value Ref Range Status   Blastomyces Antigen None Detected None Detected ng/mL Final    Comment: (NOTE) Results reported as ng/mL in 0.2 - 14.7 ng/mL range Results above the limit of detection but below 0.2 ng/mL are reported as 'Positive, Below the Limit of Quantification' Results above 14.7 ng/mL are reported as 'Positive, Above the Limit of Quantification'    Specimen Type SERUM  Final    Comment: (NOTE) Performed At: [place], [place] [id number] Bruce Donath MD [id number]   Acid Fast Smear (AFB)     Status: None   Collection Time: 05/02/20  1:48 PM   Specimen: CSF; Cerebrospinal Fluid  Result Value Ref Range Status   AFB Specimen Processing Comment  Final    Comment: (NOTE) Straight Inoculation without Smear Performed At: [facility] [place], [place] [id number] Rush Farmer MD [id number]    Acid Fast Smear QNSAFB  Final    Comment: (NOTE) Test not performed. AFB Smear not performed due to specimen source (blood) or insufficient specimen.    Source (AFB) CSF  Final    Comment: Performed at [facility], [place] [address], [place], [place] [address]  Fungus Culture With Stain     Status: None (Preliminary result)   Collection Time: 05/02/20  1:48 PM  Result Value Ref Range Status   Fungus Stain Final report  Final    Comment: (NOTE) Performed  At: [facility] [place], [place] [id number] Rush Farmer MD [id number]    Fungus (Mycology) Culture PENDING  Incomplete   Fungal Source CSF  Final    Comment: Performed at [facility], [place] [address], [place], [place] [address]  CSF culture w Stat Gram Stain     Status: None   Collection Time: 05/02/20  1:48 PM   Specimen: CSF; Cerebrospinal Fluid  Result Value Ref Range Status   Specimen Description CSF  Final   Special Requests NONE  Final   Gram Stain   Final    WBC PRESENT,BOTH PMN AND MONONUCLEAR NO ORGANISMS SEEN CYTOSPIN SMEAR    Culture   Final    NO GROWTH 3 DAYS Performed at [facility], [place] [address], [place],  [address]    Report Status 05/05/2020 FINAL  Final  Fungus Culture Result     Status: None   Collection Time: 05/02/20  1:48 PM  Result Value Ref Range Status   Result 1 Comment  Final    Comment: (NOTE) KOH/Calcofluor preparation:  no fungus observed. Performed At: [facility] [place], [place] [id number] Rush Farmer MD TG:[id number]      Discharge Instructions:   Discharge Instructions    [facility] pharmacist to adjust dose for Vancomycin, Aminoglycosides and other anti-infective therapies as requested by physician.   Complete by: As directed    [facility] to provide Cath Flo 52m   Complete by: As directed    Administer for PICC line occlusion and as ordered by physician for other access  device issues.   Ambulatory referral to Neurology   Complete by: As directed    An appointment is requested in approximately: 4 weeks -- bacterial meningitis, c/f preceding CSF leak and low pressure headache now resolved   Ambulatory referral to Physical Therapy   Complete by: As directed    Anaphylaxis Kit: Provided to treat any anaphylactic reaction to the medication being provided to the patient if First Dose or when requested by physician   Complete by: As  directed    Epinephrine 15m/ml vial / amp: Administer 0.344m(0.83m10msubcutaneously once for moderate to severe anaphylaxis, nurse to call physician and pharmacy when reaction occurs and call 911 if needed for immediate care   Diphenhydramine 22m27m IV vial: Administer 25-22mg70mIM PRN for first dose reaction, rash, itching, mild reaction, nurse to call physician and pharmacy when reaction occurs   Sodium Chloride 0.9% NS 500ml 70mAdminister if needed for hypovolemic blood pressure drop or as ordered by physician after call to physician with anaphylactic reaction   Call MD for:  persistant nausea and vomiting   Complete by: As directed    Call MD for:  temperature >100.4   Complete by: As directed    Change dressing on IV access line weekly and PRN   Complete by: As directed    Diet - low sodium heart healthy   Complete by: As directed    Discharge instructions   Complete by: As directed    Follow-up with your primary care physician for regular visit.  [facility] for follow-up office to contact you.  Complete the course of antibiotic as per home health.   Flush IV access with Sodium Chloride 0.9% and Heparin 10 units/ml or 100 units/ml   Complete by: As directed    Home infusion instructions - [facility]   Complete by: As directed    Instructions: Flush IV access with Sodium Chloride 0.9% and Heparin 10units/ml or 100units/ml   Change dressing on IV access line: Weekly and PRN   Instructions Cath Flo 2mg: A683mnister for PICC Line occlusion and as ordered by physician for other access device   [facility] pharmacist to adjust dose for: Vancomycin, Aminoglycosides and other anti-infective therapies as requested by physician   Increase activity slowly   Complete by: As directed    Method of administration may be changed at the discretion of home infusion pharmacist based upon assessment of the patient and/or caregiver's ability to  self-administer the medication ordered   Complete by: As directed    No wound care   Complete by: As directed      Allergies as of 05/08/2020      Reactions   Codeine Other (See Comments)   Headache      Medication List    TAKE these medications   acetaminophen 325 MG tablet Commonly known as: TYLENOL Take 1 tablet (325 mg total) by mouth every 4 (four) hours as needed for mild pain, fever or headache (or temp > 37.5 C (99.5 F)).   cefTRIAXone  IVPB Commonly known as: ROCEPHIN Inject 2 g into the vein every 12 (twelve) hours for 8 days. Indication:  Meningitis  First Dose: No Last Day of Therapy:  05/15/2020 Labs - Once weekly:  CBC/D and BMP, Labs - Every other week:  ESR and CRP Method of administration: IV Push Method of administration may be changed at the discretion of home infusion pharmacist based upon assessment of the  patient and/or caregiver's ability to self-administer the medication ordered.   clobetasol ointment 0.05 % Commonly known as: TEMOVATE APPLY 1 APPLICATION  TOPICALLY 2  TIMES DAILY   losartan 50 MG tablet Commonly known as: COZAAR Take 1 tablet (50 mg total) by mouth daily.   multivitamin with minerals Tabs tablet Take 1 tablet by mouth daily.   Odefsey 200-25-25 MG Tabs tablet Generic drug: emtricitabine-rilpivir-tenofovir AF Take 1 tablet by mouth daily with breakfast.   senna-docusate 8.6-50 MG tablet Commonly known as: Senokot-S Take 1 tablet by mouth at bedtime as needed for mild constipation.   temazepam 15 MG capsule Commonly known as: RESTORIL TAKE 1 CAPSULE BY MOUTH  EVERY NIGHT AT BEDTIME AS  NEEDED FOR SLEEP   Vitamin D3 10 MCG (400 UNIT) tablet Take 400 Units by mouth daily.            Discharge Care Instructions  (From admission, onward)         Start     Ordered   05/07/20 0000  Change dressing on IV access line weekly and PRN  (Home infusion instructions - [facility] )        05/07/20 1613           Follow-up Information    [place] Follow up.   Specialty: Rehabilitation Why: The outpatient therapy will contact you for the first appointment Contact information: [place] Follow up.   Why: The home health agency will contact you for the first home visit. Contact information: [phone]       Leeroy Cha, MD. Schedule an appointment as soon as possible for a visit in 1 week(s).   Specialty: Internal Medicine Why: regular followup and blood work Contact information: [place]. [address] [place] [address] [phone]        Campbell Riches, MD .   Specialty: Infectious Diseases Contact information: [place] [address] [phone]                Time coordinating discharge: 39 minutes  Signed:  Joseantonio Dittmar  [facility] 05/08/2020, 8:22 AM

## 2020-05-08 NOTE — Care Management Important Message (Signed)
Important Message  Patient Details  Name: Gail Edwards MRN: [id number] Date of Birth: 1955-05-27   Medicare Important Message Given:  Yes  Patient left prior to IM delivery.  IM mailed to the patient home address.     Shatasia Cutshaw 05/08/2020, 3:31 PM

## 2020-05-08 NOTE — Progress Notes (Signed)
Discharged to home after IV team came up to flush. Discharge instructions reviewed with by nursing student Morrie Sheldon and her Professor/Instructor.

## 2020-05-08 NOTE — Progress Notes (Signed)
Physical Therapy Treatment Patient Details Name: Gail Edwards MRN: [id number] DOB: [birth year] Today's Date: 05/08/2020    History of Present Illness 65 y.o. female presenting with garbled speech and L facial droop. Patient initially treated with tPA. On 05/02/2019 patient developed worsening confusion.  MRI (+) small intraventricular hemorrhage versus possible ventriculitis. ETT s/p extubation. 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 PMHx significant for HIV, pre-diabetes, and ORIF R humerus 10/18.    PT Comments    Goals met and education completed. Patient overall modI for all mobility with no AD. Patient negotiated 4 stairs with R handrail modI. Recommend OPPT following discharge. PT will sign off at this time.    Follow Up Recommendations  Outpatient PT;Supervision - Intermittent     Equipment Recommendations  None recommended by PT    Recommendations for Other Services       Precautions / Restrictions Precautions Precautions: Fall Restrictions Weight Bearing Restrictions: No    Mobility  Bed Mobility               General bed mobility comments: Pt was OOB in the recliner chair.    Transfers Overall transfer level: Modified independent Equipment used: None                Ambulation/Gait Ambulation/Gait assistance: Modified independent (Device/Increase time) Gait Distance (Feet): 600 Feet Assistive device: None Gait Pattern/deviations: WFL(Within Functional Limits) Gait velocity: normal       Stairs Stairs: Yes Stairs assistance: Modified independent (Device/Increase time) Stair Management: One rail Right;Forwards;Alternating pattern Number of Stairs: 4     Wheelchair Mobility    Modified Rankin (Stroke Patients Only)       Balance Overall balance assessment: Mild deficits observed, not formally tested                                           Cognition Arousal/Alertness: Awake/alert Behavior During Therapy: WFL for tasks assessed/performed Overall Cognitive Status: Within Functional Limits for tasks assessed                                        Exercises      General Comments        Pertinent Vitals/Pain Pain Assessment: No/denies pain    Home Living                      Prior Function            PT Goals (current goals can now be found in the care plan section) Acute Rehab PT Goals Patient Stated Goal: To return home. PT Goal Formulation: With patient Time For Goal Achievement: 05/17/20 Potential to Achieve Goals: Good Progress towards PT goals: Goals met/education completed, patient discharged from PT    Frequency    Min 3X/week      PT Plan Current plan remains appropriate    Co-evaluation              AM-PAC PT "6 Clicks" Mobility   Outcome Measure  Help needed turning from your back to your side while in a flat bed without using bedrails?: None Help needed moving from lying on your back to sitting on the side of a flat bed without using  bedrails?: None Help needed moving to and from a bed to a chair (including a wheelchair)?: None Help needed standing up from a chair using your arms (e.g., wheelchair or bedside chair)?: None Help needed to walk in hospital room?: None Help needed climbing 3-5 steps with a railing? : None 6 Click Score: 24    End of Session   Activity Tolerance: Patient tolerated treatment well Patient left: in chair;with call bell/phone within reach Nurse Communication: Mobility status PT Visit Diagnosis: Unsteadiness on feet (R26.81);Muscle weakness (generalized) (M62.81);Difficulty in walking, not elsewhere classified (R26.2)     Time: 4742-5956 PT Time Calculation (min) (ACUTE ONLY): 10 min  Charges:  $Therapeutic Activity: 8-22 mins                     Delisha Peaden A. Gilford Rile PT, DPT Acute Rehabilitation Services Pager  [phone] Office [phone]    Linna Hoff 05/08/2020, 10:13 AM

## 2020-05-08 NOTE — Plan of Care (Signed)

## 2020-05-08 NOTE — TOC Transition Note (Signed)
Transition of Care [facility]) - CM/SW Discharge Note   Patient Details  Name: Gail Edwards MRN: [id number] Date of Birth: 1955/03/30  Transition of Care [facility]) CM/SW Contact:  Kermit Balo, RN Phone Number: 05/08/2020, 10:29 AM   Clinical Narrative:    Patient is discharging home with [facility] RN through [place] and IV meds through [facility]. She will also have outpatient PT with [facility].  Pt has support at home and transportation to home.   Final next level of care: Home w Home Health Services Barriers to Discharge: No Barriers Identified   Patient Goals and CMS Choice   CMS Medicare.gov Compare Post Acute Care list provided to:: Patient Choice offered to / list presented to : Patient  Discharge Placement                       Discharge Plan and Services   Discharge Planning Services: CM Consult Post Acute Care Choice: Home Health                    HH Arranged: RN HH Agency: [facility] Human resources officer) Date HH Agency Contacted: 05/07/20   Representative spoke with at [facility] Agency: Pam  Social Determinants of Health (SDOH) Interventions     Readmission Risk Interventions No flowsheet data found.

## 2020-05-11 LAB — HSV TYPE 2 AB IGG, CSF (REFLEXED): HSV Type 2 Ab IgG, CSF (Reflexed): 0 IV (ref <=0.89)

## 2020-05-12 NOTE — Progress Notes (Signed)
Late entry for missed scoring of Modified Rankin Scale.  Score is based on review of the medical record.     05/08/20 1100  Modified Rankin (Stroke Patients Only)  Pre-Morbid Rankin Score 0  Modified Rankin 2    Lavona [place], [place]   Acute Rehabilitation Services  Pager [phone] Office [phone] 05/12/2020

## 2020-05-13 DIAGNOSIS — G9600 Cerebrospinal fluid leak, unspecified: Secondary | ICD-10-CM | POA: Insufficient documentation

## 2020-05-13 LAB — HSV TYPE 1 AB, IGG, CSF (REFLEXED): HSV Type 1 Ab, IgG, CSF (Reflexed): 5.03 IV — ABNORMAL HIGH (ref <=0.89)

## 2020-05-14 ENCOUNTER — Other Ambulatory Visit: Payer: Self-pay

## 2020-05-14 ENCOUNTER — Ambulatory Visit (INDEPENDENT_AMBULATORY_CARE_PROVIDER_SITE_OTHER): Payer: 59 | Admitting: Internal Medicine

## 2020-05-14 ENCOUNTER — Encounter: Payer: Self-pay | Admitting: Internal Medicine

## 2020-05-14 ENCOUNTER — Telehealth: Payer: Self-pay

## 2020-05-14 DIAGNOSIS — G039 Meningitis, unspecified: Secondary | ICD-10-CM | POA: Diagnosis not present

## 2020-05-14 DIAGNOSIS — B20 Human immunodeficiency virus [HIV] disease: Secondary | ICD-10-CM

## 2020-05-14 DIAGNOSIS — G9600 Cerebrospinal fluid leak, unspecified: Secondary | ICD-10-CM | POA: Diagnosis not present

## 2020-05-14 MED ORDER — CEFTRIAXONE IV (FOR PTA / DISCHARGE USE ONLY): 2.0000 g | Freq: Two times a day (BID) | INTRAVENOUS | Status: AC

## 2020-05-14 NOTE — Progress Notes (Signed)
RN drew labs from patient's PICC per Dr. Orvan Falconer. Dressing was clean, dry, and intact. Line flushed well with good blood return. RN replaced extension catheter and cap. PICC line flushed with saline and heparin flush and clamped.   Sandie Ano, RN

## 2020-05-14 NOTE — Telephone Encounter (Signed)
RN relayed verbal orders per Dr. Orvan Falconer to E[facility] Blase at [facility] to pull [facility] tomorrow (05/15/20) after last dose. Debbie verbalized understanding.   Sandie Ano, RN

## 2020-05-14 NOTE — Assessment & Plan Note (Signed)
More than likely the inflammation related to her meningitis will seal her CSF leak.  I instructed her to let us know right away if she has any further fluid draining from her nose.

## 2020-05-14 NOTE — Assessment & Plan Note (Signed)
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  She is improving on ceftriaxone and will complete therapy tomorrow.

## 2020-05-14 NOTE — Progress Notes (Signed)
Patient Active Problem List   Diagnosis Date Noted  . CSF leak 05/13/2020    Priority: High  . Meningitis 05/04/2020    Priority: High  . HIV disease ([place]) 11/24/2005    Priority: Medium  . Encounter for intubation   . Encounter for orogastric (OG) tube placement   . Alopecia 01/04/2018  . Bruising 01/04/2018  . Hyperlipidemia 12/06/2012  . Elevated blood sugar 05/29/2012  . Insomnia 05/29/2012  . HTN (hypertension) 11/25/2011  . PAP SMER CERV W/LW GRADE SQUAMOUS INTRAEPITH LES 06/20/2007  . PSORIASIS NEC 11/28/2006    Patient's Medications  New Prescriptions   No medications on file  Previous Medications   ACETAMINOPHEN (TYLENOL) 325 MG TABLET    Take 1 tablet (325 mg total) by mouth every 4 (four) hours as needed for mild pain, fever or headache (or temp > 37.5 C (99.5 F)).   CHOLECALCIFEROL (VITAMIN D3) 10 MCG (400 UNIT) TABLET    Take 400 Units by mouth daily.   CLOBETASOL OINTMENT (TEMOVATE) 0.05 %    APPLY 1 APPLICATION  TOPICALLY 2  TIMES DAILY   EMTRICITABINE-RILPIVIR-TENOFOVIR AF (ODEFSEY) 200-25-25 MG TABS TABLET    Take 1 tablet by mouth daily with breakfast.   LOSARTAN (COZAAR) 50 MG TABLET    Take 1 tablet (50 mg total) by mouth daily.   MULTIPLE VITAMIN (MULTIVITAMIN WITH MINERALS) TABS TABLET    Take 1 tablet by mouth daily.   SENNA-DOCUSATE (SENOKOT-S) 8.6-50 MG TABLET    Take 1 tablet by mouth at bedtime as needed for mild constipation.   TEMAZEPAM (RESTORIL) 15 MG CAPSULE    TAKE 1 CAPSULE BY MOUTH  EVERY NIGHT AT BEDTIME AS  NEEDED FOR SLEEP  Modified Medications   Modified Medication Previous Medication   CEFTRIAXONE (ROCEPHIN) IVPB cefTRIAXone (ROCEPHIN) IVPB      Inject 2 g into the vein every 12 (twelve) hours for 1 day. Indication:  Meningitis  First Dose: No Last Day of Therapy:  05/15/2020 Labs - Once weekly:  CBC/D and BMP, Labs - Every other week:  ESR and CRP Method of administration: IV Push Method of administration may be changed  at the discretion of home infusion pharmacist based upon assessment of the patient and/or caregiver's ability to self-administer the medication ordered.    Inject 2 g into the vein every 12 (twelve) hours for 8 days. Indication:  Meningitis  First Dose: No Last Day of Therapy:  05/15/2020 Labs - Once weekly:  CBC/D and BMP, Labs - Every other week:  ESR and CRP Method of administration: IV Push Method of administration may be changed at the discretion of home infusion pharmacist based upon assessment of the patient and/or caregiver's ability to self-administer the medication ordered.  Discontinued Medications   No medications on file    Subjective: Ms. Burdell is in for her hospital follow-up visit.  Several months ago she had a violent sneeze and for about 4 weeks afterward had intermittent dripping of clear fluid from her nose associated with intermittent headaches.  On 04/30/2020 she was walking on a treadmill and suddenly developed a severe, progressive headache.  She took some ibuprofen and went to church where she became disoriented causing other church members to call EMS.  She was admitted to the hospital and started on broad spectrum antibiotics for possible meningitis.  Lumbar puncture was performed which showed 1780 white blood cells of which 87% were segmented neutrophils.  Protein was elevated  at 197 and her glucose was 79.  No organisms were seen on Gram stain and cultures were negative but the lumbar puncture was performed after antibiotics were started.  Antibiotic therapy was narrowed to ceftriaxone.  She has now completed 13 days of therapy.  She has not had any fluid dripping from her nose for the past 4 weeks.  She has been on [facility] for HIV infection which has been under excellent control for many years.  Review of Systems: Review of Systems  Constitutional: Positive for malaise/fatigue. Negative for chills, diaphoresis and fever.  HENT: Negative for congestion, ear pain and sinus  pain.   Respiratory: Negative for cough and shortness of breath.   Cardiovascular: Negative for chest pain.  Gastrointestinal: Negative for abdominal pain, diarrhea, nausea and vomiting.  Musculoskeletal: Negative for back pain.  Neurological: Negative for focal weakness and headaches.    Past Medical History:  Diagnosis Date  . Abnormal Pap smear 2009   lsil-cin1  . HIV infection ([place])   . Pre-diabetes     Social History   Tobacco Use  . Smoking status: Never Smoker  . Smokeless tobacco: Never Used  Vaping Use  . Vaping Use: Never used  Substance Use Topics  . Alcohol use: No  . Drug use: No    Family History  Problem Relation Age of Onset  . Diabetes Mother   . Cancer Father        brain  . Breast cancer Neg Hx     Allergies  Allergen Reactions  . Codeine Other (See Comments)    Headache    Health Maintenance  Topic Date Due  . PAP SMEAR-Modifier  12/05/2019  . COVID-19 Vaccine (4 - Booster for Pfizer series) 07/03/2020  . MAMMOGRAM  12/02/2021  . TETANUS/TDAP  10/09/2025  . COLONOSCOPY (Pts 45-26yr Insurance coverage will need to be confirmed)  02/23/2028  . INFLUENZA VACCINE  Completed  . Hepatitis C Screening  Completed  . HIV Screening  Completed  . HPV VACCINES  Aged Out    Objective:  Vitals:   05/14/20 0853  BP: 138/80  Pulse: (!) 111  Temp: 97.6 F (36.4 C)  TempSrc: Oral  SpO2: 99%  Weight: 231 lb (104.8 kg)  Height: _0  (1.626 m)   Body mass index is 39.65 kg/m.  Physical Exam Constitutional:      Comments: She is in good spirits.  I  Cardiovascular:     Rate and Rhythm: Normal rate and regular rhythm.     Heart sounds: No murmur heard.   Pulmonary:     Effort: Pulmonary effort is normal.     Breath sounds: Normal breath sounds.  Musculoskeletal:     Cervical back: Neck supple.  Neurological:     General: No focal deficit present.  Psychiatric:        Mood and Affect: Mood normal.     Lab Results Lab Results   Component Value Date   WBC 9.5 05/08/2020   HGB 11.0 (L) 05/08/2020   HCT 33.0 (L) 05/08/2020   MCV 88.9 05/08/2020   PLT 268 05/08/2020    Lab Results  Component Value Date   CREATININE 0.62 05/08/2020   BUN 16 05/08/2020   NA 139 05/08/2020   K 3.6 05/08/2020   CL 106 05/08/2020   CO2 27 05/08/2020    Lab Results  Component Value Date   ALT 15 05/02/2020   AST 14 (L) 05/02/2020   ALKPHOS 70 05/02/2020  BILITOT 0.6 05/02/2020    Lab Results  Component Value Date   CHOL 133 05/01/2020   HDL 46 05/01/2020   LDLCALC 82 05/01/2020   TRIG 27 05/01/2020   CHOLHDL 2.9 05/01/2020   Lab Results  Component Value Date   LABRPR NON REACTIVE 05/02/2020   HIV 1 RNA Quant (copies/mL)  Date Value  05/02/2020 <20  11/09/2011 <20  02/25/2011 <20   CD4 T Cell Abs  Date Value  05/02/2020 121 /uL (L)  11/09/2011 760 cmm  02/25/2011 780 cmm     Problem List Items Addressed This Visit      High   Meningitis    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  She is improving on ceftriaxone and will complete therapy tomorrow.      CSF leak    More than likely the inflammation related to her meningitis will seal her CSF leak.  I instructed her to let us know right away if she has any further fluid draining from her nose.        Medium   HIV disease ([place])    Her HIV infection has been under excellent long-term control with Odefsey.  Suspect that her and CD4 count of 121 is a transient change related to her meningitis that will self-correct.  She will get a repeat CD4 count today and follow-up in 4 to 6 weeks.  She will continue Odefsey.      Relevant Medications   cefTRIAXone (ROCEPHIN) IVPB   Other Relevant Orders   T-helper cell (CD4)- ([facility] clinic only)        Michel Bickers, MD [facility] for [place] [phone] pager   [phone] cell 05/14/2020, 9:32 AM

## 2020-05-14 NOTE — Assessment & Plan Note (Signed)
Her HIV infection has been under excellent long-term control with Odefsey.  Suspect that her and CD4 count of 121 is a transient change related to her meningitis that will self-correct.  She will get a repeat CD4 count today and follow-up in 4 to 6 weeks.  She will continue Odefsey.

## 2020-05-15 LAB — T-HELPER CELL (CD4) - (RCID CLINIC ONLY)
CD4 % Helper T Cell: 40 % (ref 33–65)
CD4 T Cell Abs: 517 /uL (ref 400–1790)

## 2020-05-26 ENCOUNTER — Encounter: Payer: Self-pay | Admitting: Diagnostic Neuroimaging

## 2020-05-26 ENCOUNTER — Ambulatory Visit (INDEPENDENT_AMBULATORY_CARE_PROVIDER_SITE_OTHER): Payer: 59 | Admitting: Diagnostic Neuroimaging

## 2020-05-26 VITALS — BP 143/97 | HR 91 | Ht 64.0 in | Wt 241.0 lb

## 2020-05-26 DIAGNOSIS — G039 Meningitis, unspecified: Secondary | ICD-10-CM | POA: Diagnosis not present

## 2020-05-26 DIAGNOSIS — G9600 Cerebrospinal fluid leak, unspecified: Secondary | ICD-10-CM | POA: Diagnosis not present

## 2020-05-26 NOTE — Patient Instructions (Signed)
-   symptoms resolved; doing well - no headaches or signs of CSF leak at this time - monitor; follow up as needed

## 2020-05-26 NOTE — Progress Notes (Signed)
[facility]  PATIENT: Gail Edwards DOB: 1955-03-01  REFERRING CLINICIAN: Bhagat, Karmen Bongo, MD HISTORY FROM: patient  REASON FOR VISIT: new consult    HISTORICAL  CHIEF COMPLAINT:  Chief Complaint  Patient presents with  . Bacterial meningitis preceding CSF leak    Rm 7 New Pt, ED referral  "evaluation for need for out patient rehab services"    HISTORY OF PRESENT ILLNESS:   65 year old female with history of HIV here for evaluation of meningitis and CSF leak.  04/30/2020 patient presented to the hospital for slurred speech and left facial droop.  Patient was given IV TPA.  Post TPA scans demonstrated fluid/debris in the ventricles suggestive of ventriculitis.  Patient was empirically treated for meningitis.  CSF analysis consistent with bacterial meningitis.  Patient was treated for meningitis and gradually improved.  Prior to hospitalization patient had recalled clear fluid leaking from her nose after coughing/sneezing episode.  Possibility of CSF leak was raised.  Neurosurgery was consulted.  Ultimately patient improved clinical course and was discharged home.  She has follow-up with ID clinic and PCP.  She is doing well on her baseline.  No recurrent headaches, confusion, fevers or rhinorrhea.   REVIEW OF SYSTEMS: Full 14 system review of systems performed and negative with exception of: as per HPI.  ALLERGIES: Allergies  Allergen Reactions  . Codeine Other (See Comments)    Headache    HOME MEDICATIONS: Outpatient Medications Prior to Visit  Medication Sig Dispense Refill  . acetaminophen (TYLENOL) 325 MG tablet Take 1 tablet (325 mg total) by mouth every 4 (four) hours as needed for mild pain, fever or headache (or temp > 37.5 C (99.5 F)).    . calcium carbonate (OSCAL) 1500 (600 Ca) MG TABS tablet Take by mouth daily with breakfast.    . Cholecalciferol (VITAMIN D3) 10 MCG (400 UNIT) tablet Take 400 Units by mouth daily.    . clobetasol ointment  (TEMOVATE) 0.05 % APPLY 1 APPLICATION  TOPICALLY 2  TIMES DAILY 90 g 5  . emtricitabine-rilpivir-tenofovir AF (ODEFSEY) 200-25-25 MG TABS tablet Take 1 tablet by mouth daily with breakfast. 90 tablet 5  . losartan (COZAAR) 50 MG tablet Take 1 tablet (50 mg total) by mouth daily. 30 tablet 2  . Multiple Vitamin (MULTIVITAMIN WITH MINERALS) TABS tablet Take 1 tablet by mouth daily. 100 tablet   . temazepam (RESTORIL) 15 MG capsule TAKE 1 CAPSULE BY MOUTH  EVERY NIGHT AT BEDTIME AS  NEEDED FOR SLEEP 90 capsule 0  . senna-docusate (SENOKOT-S) 8.6-50 MG tablet Take 1 tablet by mouth at bedtime as needed for mild constipation. (Patient not taking: Reported on 05/14/2020) 30 tablet    No facility-administered medications prior to visit.    PAST MEDICAL HISTORY: Past Medical History:  Diagnosis Date  . Abnormal Pap smear 2009   lsil-cin1  . Diabetes mellitus without complication (H[facility])   . Femur fracture, left (H[facility]) 2021   no surgery required  . HIV infection ([facility])   . Hypertension   . Insomnia   . Meningitis   . Osteopenia   . Pre-diabetes   . Psoriasis   . Vitamin D deficiency     PAST SURGICAL HISTORY: Past Surgical History:  Procedure Laterality Date  . CESAREAN SECTION  1987  . CHOLECYSTECTOMY  1993  . HERNIA REPAIR  1994, 2001   umbilical  . ORIF HUMERUS FRACTURE Right 12/17/2016   Procedure: OPEN REDUCTION INTERNAL FIXATION (ORIF) RIGHT PROXIMAL HUMERUS FRACTURE;  Surgeon:  Sheral Apley, MD;  Location: [facility];  Service: Orthopedics;  Laterality: Right;    FAMILY HISTORY: Family History  Problem Relation Age of Onset  . Diabetes Mother   . Cancer Father        brain  . Dementia Father   . Breast cancer Neg Hx     SOCIAL HISTORY: Social History   Socioeconomic History  . Marital status: Legally Separated    Spouse name: Not on file  . Number of children: 1  . Years of education: Not on file  . Highest education level: Bachelor's degree (e.g.,  BA, AB, BS)  Occupational History  . Not on file  Tobacco Use  . Smoking status: Never Smoker  . Smokeless tobacco: Never Used  Vaping Use  . Vaping Use: Never used  Substance and Sexual Activity  . Alcohol use: Never  . Drug use: Never  . Sexual activity: Yes    Partners: Male    Birth control/protection: Condom  Other Topics Concern  . Not on file  Social History Narrative   lives alone   Hot tea daily   Social Determinants of Health   Financial Resource Strain: Not on file  Food Insecurity: Not on file  Transportation Needs: Not on file  Physical Activity: Not on file  Stress: Not on file  Social Connections: Not on file  Intimate Partner Violence: Not on file     PHYSICAL EXAM  GENERAL EXAM/CONSTITUTIONAL: Vitals:  Vitals:   05/26/20 0943 05/26/20 0946  BP: (!) 175/110 (!) 143/97  Pulse: (!) 108 91  Weight: 241 lb (109.3 kg)   Height: 5\' 4"  (1.626 m)    Body mass index is 41.37 kg/m. Wt Readings from Last 3 Encounters:  05/26/20 241 lb (109.3 kg)  05/14/20 231 lb (104.8 kg)  05/08/20 251 lb 1.7 oz (113.9 kg)    Patient is in no distress; well developed, nourished and groomed; neck is supple  CARDIOVASCULAR:  Examination of carotid arteries is normal; no carotid bruits  Regular rate and rhythm, no murmurs  Examination of peripheral vascular system by observation and palpation is normal  EYES:  Ophthalmoscopic exam of optic discs and posterior segments is normal; no papilledema or hemorrhages No exam data present  MUSCULOSKELETAL:  Gait, strength, tone, movements noted in Neurologic exam below  NEUROLOGIC: MENTAL STATUS:  No flowsheet data found.  awake, alert, oriented to person, place and time  recent and remote memory intact  normal attention and concentration  language fluent, comprehension intact, naming intact  fund of knowledge appropriate  CRANIAL NERVE:   2nd - no papilledema on fundoscopic exam  2nd, 3rd, 4th, 6th -  pupils equal and reactive to light, visual fields full to confrontation, extraocular muscles intact, no nystagmus  5th - facial sensation symmetric  7th - facial strength symmetric  8th - hearing intact  9th - palate elevates symmetrically, uvula midline  11th - shoulder shrug symmetric  12th - tongue protrusion midline  MOTOR:   normal bulk and tone, full strength in the BUE, BLE  SENSORY:   normal and symmetric to light touch, temperature, vibration  COORDINATION:   finger-nose-finger, fine finger movements normal  REFLEXES:   deep tendon reflexes present and symmetric  GAIT/STATION:   narrow based gait     DIAGNOSTIC DATA (LABS, IMAGING, TESTING) - I reviewed patient records, labs, notes, testing and imaging myself where available.  Lab Results  Component Value Date   WBC 9.5 05/08/2020  HGB 11.0 (L) 05/08/2020   HCT 33.0 (L) 05/08/2020   MCV 88.9 05/08/2020   PLT 268 05/08/2020      Component Value Date/Time   NA 139 05/08/2020 0500   K 3.6 05/08/2020 0500   CL 106 05/08/2020 0500   CO2 27 05/08/2020 0500   GLUCOSE 110 (H) 05/08/2020 0500   BUN 16 05/08/2020 0500   CREATININE 0.62 05/08/2020 0500   CREATININE 0.82 11/09/2011 0850   CALCIUM 8.2 (L) 05/08/2020 0500   PROT 6.2 (L) 05/02/2020 0031   ALBUMIN 3.1 (L) 05/02/2020 0031   AST 14 (L) 05/02/2020 0031   ALT 15 05/02/2020 0031   ALKPHOS 70 05/02/2020 0031   BILITOT 0.6 05/02/2020 0031   GFRNONAA >60 05/08/2020 0500   GFRAA >60 12/13/2016 1459   Lab Results  Component Value Date   CHOL 133 05/01/2020   HDL 46 05/01/2020   LDLCALC 82 05/01/2020   TRIG 27 05/01/2020   CHOLHDL 2.9 05/01/2020   Lab Results  Component Value Date   HGBA1C 5.7 (H) 05/01/2020   No results found for: VITAMINB12 No results found for: TSH   05/06/20 MRI brain w/wo Interval development of small areas of restricted diffusion in the subarachnoid space over the convexity bilaterally compatible  with meningitis. Progressive restricted diffusion in the occipital horns bilaterally compatible with meningitis. No hydrocephalus or abscess.  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  Mild meningeal enhancement on the right likely related to meningitis.  Empty sella.  This could be a source of the CSF leak.    ASSESSMENT AND PLAN  65 y.o. year old female here with:  Dx:  1. Meningitis   2. CSF leak     PLAN:  PNEUMOCOCCAL MENINGITIS (possibly due to spontaneous CSF leak; s/p IV abx) - symptoms resolved; doing well - no headaches or signs of CSF leak at this time - monitor; follow up as needed  Return for return to PCP, pending if symptoms worsen or fail to improve.    Suanne Marker, MD 05/26/2020, 10:41 AM Certified in Neurology, Neurophysiology and Neuroimaging  [facility] [address] [place], [place] [address] [phone]

## 2020-06-03 LAB — FUNGUS CULTURE WITH STAIN

## 2020-06-03 LAB — FUNGAL ORGANISM REFLEX

## 2020-06-03 LAB — FUNGUS CULTURE RESULT

## 2020-06-21 LAB — ACID FAST CULTURE WITH REFLEXED SENSITIVITIES (MYCOBACTERIA): Acid Fast Culture: NEGATIVE

## 2020-06-25 ENCOUNTER — Other Ambulatory Visit: Payer: Self-pay

## 2020-06-25 ENCOUNTER — Encounter: Payer: Self-pay | Admitting: Internal Medicine

## 2020-06-25 ENCOUNTER — Ambulatory Visit (INDEPENDENT_AMBULATORY_CARE_PROVIDER_SITE_OTHER): Payer: 59 | Admitting: Internal Medicine

## 2020-06-25 VITALS — BP 148/77 | HR 84 | Temp 98.3°F | Ht 64.0 in | Wt 240.0 lb

## 2020-06-25 DIAGNOSIS — N898 Other specified noninflammatory disorders of vagina: Secondary | ICD-10-CM | POA: Diagnosis not present

## 2020-06-25 DIAGNOSIS — G039 Meningitis, unspecified: Secondary | ICD-10-CM | POA: Diagnosis not present

## 2020-06-25 DIAGNOSIS — G9600 Cerebrospinal fluid leak, unspecified: Secondary | ICD-10-CM

## 2020-06-25 DIAGNOSIS — B20 Human immunodeficiency virus [HIV] disease: Secondary | ICD-10-CM | POA: Diagnosis not present

## 2020-06-25 DIAGNOSIS — Z23 Encounter for immunization: Secondary | ICD-10-CM | POA: Diagnosis not present

## 2020-06-25 MED ORDER — VALACYCLOVIR HCL 500 MG PO TABS: 500.0000 mg | ORAL_TABLET | Freq: Two times a day (BID) | ORAL | 1 refills | Status: DC

## 2020-06-25 NOTE — Assessment & Plan Note (Signed)
I will give her a trial of empiric valacyclovir.

## 2020-06-25 NOTE — Assessment & Plan Note (Signed)
Her infection remains under excellent, long-term control.  As I expected, her CD4 count rebounded to normal after resolution of her meningitis.

## 2020-06-25 NOTE — Progress Notes (Signed)
Patient Active Problem List   Diagnosis Date Noted  . CSF leak 05/13/2020    Priority: High  . Meningitis 05/04/2020    Priority: High  . HIV disease (HCC) 11/24/2005    Priority: Medium  . Vaginal sore 06/25/2020  . Encounter for intubation   . Encounter for orogastric (OG) tube placement   . Alopecia 01/04/2018  . Bruising 01/04/2018  . Hyperlipidemia 12/06/2012  . Elevated blood sugar 05/29/2012  . Insomnia 05/29/2012  . HTN (hypertension) 11/25/2011  . PAP SMER CERV W/LW GRADE SQUAMOUS INTRAEPITH LES 06/20/2007  . PSORIASIS NEC 11/28/2006    Patient's Medications  New Prescriptions   VALACYCLOVIR (VALTREX) 500 MG TABLET    Take 1 tablet (500 mg total) by mouth 2 (two) times daily.  Previous Medications   ACETAMINOPHEN (TYLENOL) 325 MG TABLET    Take 1 tablet (325 mg total) by mouth every 4 (four) hours as needed for mild pain, fever or headache (or temp > 37.5 C (99.5 F)).   CALCIUM CARBONATE (OSCAL) 1500 (600 CA) MG TABS TABLET    Take by mouth daily with breakfast.   CHOLECALCIFEROL (VITAMIN D3) 10 MCG (400 UNIT) TABLET    Take 400 Units by mouth daily.   CLOBETASOL OINTMENT (TEMOVATE) 0.05 %    APPLY 1 APPLICATION  TOPICALLY 2  TIMES DAILY   EMTRICITABINE-RILPIVIR-TENOFOVIR AF (ODEFSEY) 200-25-25 MG TABS TABLET    Take 1 tablet by mouth daily with breakfast.   LOSARTAN (COZAAR) 50 MG TABLET    Take 1 tablet (50 mg total) by mouth daily.   MULTIPLE VITAMIN (MULTIVITAMIN WITH MINERALS) TABS TABLET    Take 1 tablet by mouth daily.   SENNA-DOCUSATE (SENOKOT-S) 8.6-50 MG TABLET    Take 1 tablet by mouth at bedtime as needed for mild constipation.   TEMAZEPAM (RESTORIL) 15 MG CAPSULE    TAKE 1 CAPSULE BY MOUTH  EVERY NIGHT AT BEDTIME AS  NEEDED FOR SLEEP  Modified Medications   No medications on file  Discontinued Medications   No medications on file    Subjective: Gail Edwards is in for her hospital follow-up visit.  Several months ago she had a violent sneeze  and for about 4 weeks afterward had intermittent dripping of clear fluid from her nose associated with intermittent headaches.  On 04/30/2020 she was walking on a treadmill and suddenly developed a severe, progressive headache.  She took some ibuprofen and went to church where she became disoriented causing other church members to call EMS.  She was admitted to the hospital and started on broad spectrum antibiotics for possible meningitis.  Lumbar puncture was performed which showed 1780 white blood cells of which 87% were segmented neutrophils.  Protein was elevated at 197 and her glucose was 79.  No organisms were seen on Gram stain and cultures were negative but the lumbar puncture was performed after antibiotics were started.  Antibiotic therapy was narrowed to ceftriaxone.  She completed 2 weeks of IV ceftriaxone on 05/15/2020.  She has had no further headache fever or confusion.  However, over the past few weeks she has noted intermittent drops of clear fluid from her right nares.  She has also developed a vaginal sore over the past 2 weeks.  She has had fever blisters in the past but has never had a symptomatic episode of genital herpes.  She has no vaginal discharge or dysuria.  She has been on [facility] for HIV infection which  has been under excellent control for many years.  Review of Systems: Review of Systems  Constitutional: Negative for chills, diaphoresis, fever and malaise/fatigue.  HENT: Negative for congestion, ear pain and sinus pain.   Respiratory: Negative for cough and shortness of breath.   Cardiovascular: Negative for chest pain.  Gastrointestinal: Negative for abdominal pain, diarrhea, nausea and vomiting.  Genitourinary: Negative for dysuria.       As noted in HPI.  Musculoskeletal: Negative for back pain.  Neurological: Negative for focal weakness and headaches.    Past Medical History:  Diagnosis Date  . Abnormal Pap smear 2009   lsil-cin1  . Diabetes mellitus without  complication (HCC)   . Femur fracture, left (HCC) 2021   no surgery required  . HIV infection (HCC)   . Hypertension   . Insomnia   . Meningitis   . Osteopenia   . Pre-diabetes   . Psoriasis   . Vitamin D deficiency     Social History   Tobacco Use  . Smoking status: Never Smoker  . Smokeless tobacco: Never Used  Vaping Use  . Vaping Use: Never used  Substance Use Topics  . Alcohol use: Never  . Drug use: Never    Family History  Problem Relation Age of Onset  . Diabetes Mother   . Cancer Father        brain  . Dementia Father   . Breast cancer Neg Hx     Allergies  Allergen Reactions  . Codeine Other (See Comments)    Headache    Health Maintenance  Topic Date Due  . PAP SMEAR-Modifier  12/05/2019  . COVID-19 Vaccine (4 - Booster for Pfizer series) 07/03/2020  . INFLUENZA VACCINE  09/22/2020  . MAMMOGRAM  12/02/2021  . TETANUS/TDAP  10/09/2025  . COLONOSCOPY (Pts 45-36yrs Insurance coverage will need to be confirmed)  02/23/2028  . Hepatitis C Screening  Completed  . HIV Screening  Completed  . HPV VACCINES  Aged Out    Objective:  Vitals:   06/25/20 0831  BP: (!) 148/77  Pulse: 84  Temp: 98.3 F (36.8 C)  TempSrc: Oral  SpO2: 100%  Weight: 240 lb (108.9 kg)  Height: 5\' 4"  (1.626 m)   Body mass index is 41.2 kg/m.  Physical Exam Constitutional:      Comments: She is in no distress but has appropriate concerns about recurrent CSF leak.  Cardiovascular:     Rate and Rhythm: Normal rate and regular rhythm.     Heart sounds: No murmur heard.   Pulmonary:     Effort: Pulmonary effort is normal.     Breath sounds: Normal breath sounds.  Musculoskeletal:     Cervical back: Neck supple.  Neurological:     General: No focal deficit present.  Psychiatric:        Mood and Affect: Mood normal.     Lab Results Lab Results  Component Value Date   WBC 9.5 05/08/2020   HGB 11.0 (L) 05/08/2020   HCT 33.0 (L) 05/08/2020   MCV 88.9  05/08/2020   PLT 268 05/08/2020    Lab Results  Component Value Date   CREATININE 0.62 05/08/2020   BUN 16 05/08/2020   NA 139 05/08/2020   K 3.6 05/08/2020   CL 106 05/08/2020   CO2 27 05/08/2020    Lab Results  Component Value Date   ALT 15 05/02/2020   AST 14 (L) 05/02/2020   ALKPHOS 70 05/02/2020   BILITOT  0.6 05/02/2020    Lab Results  Component Value Date   CHOL 133 05/01/2020   HDL 46 05/01/2020   LDLCALC 82 05/01/2020   TRIG 27 05/01/2020   CHOLHDL 2.9 05/01/2020   Lab Results  Component Value Date   LABRPR NON REACTIVE 05/02/2020   HIV 1 RNA Quant (copies/mL)  Date Value  05/02/2020 <20  11/09/2011 <20  02/25/2011 <20   CD4 T Cell Abs  Date Value  05/14/2020 517 /uL  05/02/2020 121 /uL (L)  11/09/2011 760 cmm     Problem List Items Addressed This Visit      High   Meningitis    Her meningitis has resolved but with recurrent CSF leak she remains at risk for recurrent infection.  There is no proven indication for antibiotic prophylaxis.  I will update her vaccination status with 23 valent pneumococcal vaccine.  She knows to call if she develops any further headache or fever.      CSF leak    I will refer her back to see Dr. Dutch Quint, the neurosurgeon who saw her in the hospital.      Relevant Orders   Ambulatory referral to Neurosurgery     Medium   HIV disease [facility])    Her infection remains under excellent, long-term control.  As I expected, her CD4 count rebounded to normal after resolution of her meningitis.      Relevant Medications   valACYclovir (VALTREX) 500 MG tablet     Unprioritized   Vaginal sore    I will give her a trial of empiric valacyclovir.      Relevant Medications   valACYclovir (VALTREX) 500 MG tablet    Other Visit Diagnoses    Need for pneumococcal vaccination    -  Primary   Relevant Orders   Pneumococcal polysaccharide vaccine 23-valent greater than or equal to 2yo subcutaneous/IM (Completed)        Cliffton Asters, MD [facility] [phone] pager   [phone] cell 06/25/2020, 8:58 AM

## 2020-06-25 NOTE — Assessment & Plan Note (Signed)
Her meningitis has resolved but with recurrent CSF leak she remains at risk for recurrent infection.  There is no proven indication for antibiotic prophylaxis.  I will update her vaccination status with 23 valent pneumococcal vaccine.  She knows to call if she develops any further headache or fever.

## 2020-06-25 NOTE — Assessment & Plan Note (Signed)
I will refer her back to see Dr. Dutch Quint, the neurosurgeon who saw her in the hospital.

## 2020-07-16 ENCOUNTER — Telehealth: Payer: Self-pay

## 2020-07-16 NOTE — Telephone Encounter (Signed)
Patient called back, would prefer to wait on appointment with Dr. Orvan Falconer until the testing ordered by Dr. Jordan Likes comes back. RN offered to schedule patient, she states she will call back to schedule with Dr. Orvan Falconer once she gets her upcoming tests scheduled.   Sandie Ano, RN

## 2020-07-16 NOTE — Telephone Encounter (Signed)
Per Dr. Orvan Falconer, patient appointment next week is ok to be converted to a phone/video visit or postponed until she's seen by neurologist Dr. Jordan Likes . Left voicemail requesting call back to confirm if change is ok.   Moxon Messler Loyola Mast, RN

## 2020-07-22 ENCOUNTER — Other Ambulatory Visit (HOSPITAL_COMMUNITY): Payer: Self-pay | Admitting: Neurosurgery

## 2020-07-22 ENCOUNTER — Other Ambulatory Visit: Payer: Self-pay | Admitting: Neurosurgery

## 2020-07-22 DIAGNOSIS — G9601 Cranial cerebrospinal fluid leak, spontaneous: Secondary | ICD-10-CM

## 2020-07-23 ENCOUNTER — Ambulatory Visit: Payer: 59 | Admitting: Internal Medicine

## 2020-08-01 ENCOUNTER — Other Ambulatory Visit: Payer: Self-pay | Admitting: Infectious Diseases

## 2020-08-01 ENCOUNTER — Telehealth: Payer: Self-pay | Admitting: *Deleted

## 2020-08-01 DIAGNOSIS — G4709 Other insomnia: Secondary | ICD-10-CM

## 2020-08-01 MED ORDER — TEMAZEPAM 15 MG PO CAPS: ORAL_CAPSULE | ORAL | 4 refills | Status: DC

## 2020-08-01 MED ORDER — TEMAZEPAM 15 MG PO CAPS: ORAL_CAPSULE | ORAL | 0 refills | Status: DC

## 2020-08-01 NOTE — Telephone Encounter (Signed)
Patient called 1) to request refills of temazepam be sent to [facility], and 2) ask how/who she might contact to get the imaging Dr Jordan Likes ordered scheduled.  RN advised patient to call Radiology central scheduling again today to see if it gets scheduled through them, and then to follow up with Dr Lindalou Hose office for further information. Regarding Temezapam, this must be e-scribed by physician, cannot be sent by clinic. Will send to Dr Ninetta Lights. Andree Coss, RN

## 2020-08-12 ENCOUNTER — Other Ambulatory Visit: Payer: Self-pay

## 2020-08-12 NOTE — Patient Outreach (Signed)
[facility]) Care Management  08/12/2020  Gail Edwards September 06, 1955 [id number]   First telephone outreach attempt to obtain mRS. No answer. Left message for returned call.  Vanice Sarah [facility] Management Assistant [phone]

## 2020-08-15 ENCOUNTER — Ambulatory Visit (HOSPITAL_COMMUNITY)
Admission: RE | Admit: 2020-08-15 | Discharge: 2020-08-15 | Disposition: A | Discharge status: Home / Self Care | Payer: 59 | Source: Ambulatory Visit | Attending: Neurosurgery | Admitting: Neurosurgery

## 2020-08-15 ENCOUNTER — Other Ambulatory Visit: Payer: Self-pay

## 2020-08-15 DIAGNOSIS — G9601 Cranial cerebrospinal fluid leak, spontaneous: Secondary | ICD-10-CM | POA: Insufficient documentation

## 2020-08-15 LAB — GLUCOSE, CAPILLARY: Glucose-Capillary: 102 mg/dL — ABNORMAL HIGH (ref 70–99)

## 2020-08-15 IMAGING — RF DG SPINAL PUNCT LUMBAR DIAG WITH FL CT GUIDANCE
6 series · 7 of 7 positions shown · non-contrast
Comparison: none

CLINICAL DATA: CSF rhinorrhea

[Series 1: cp_standard · 0.17mm/px · 1 of 1 slices shown (1 of 5)]
[im 1/1]
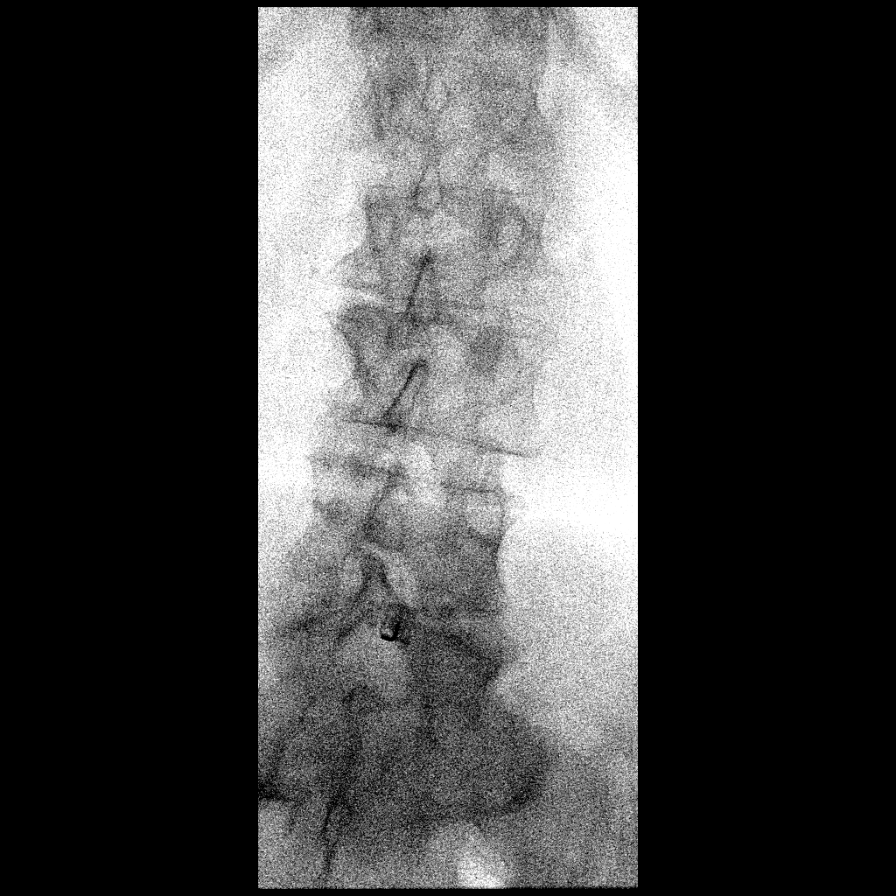

[Series 2: fluoro_iodine 2fps_bw · 0.17mm/px · 2 of 2 frames shown]
[frame 1/2]
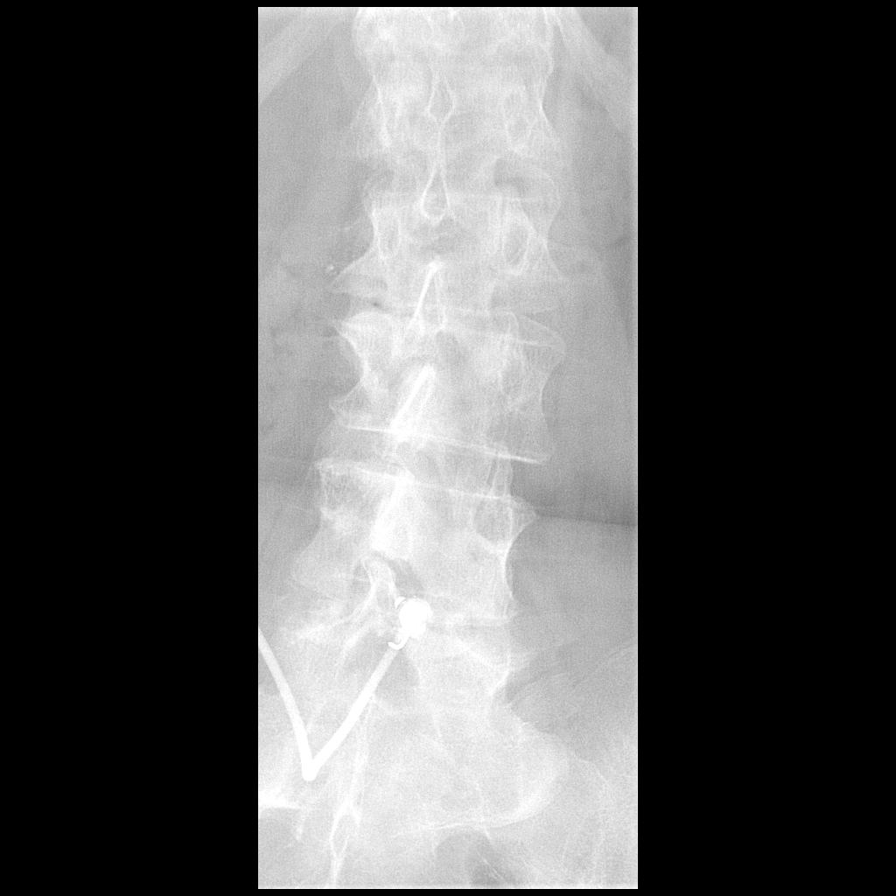
[frame 2/2]
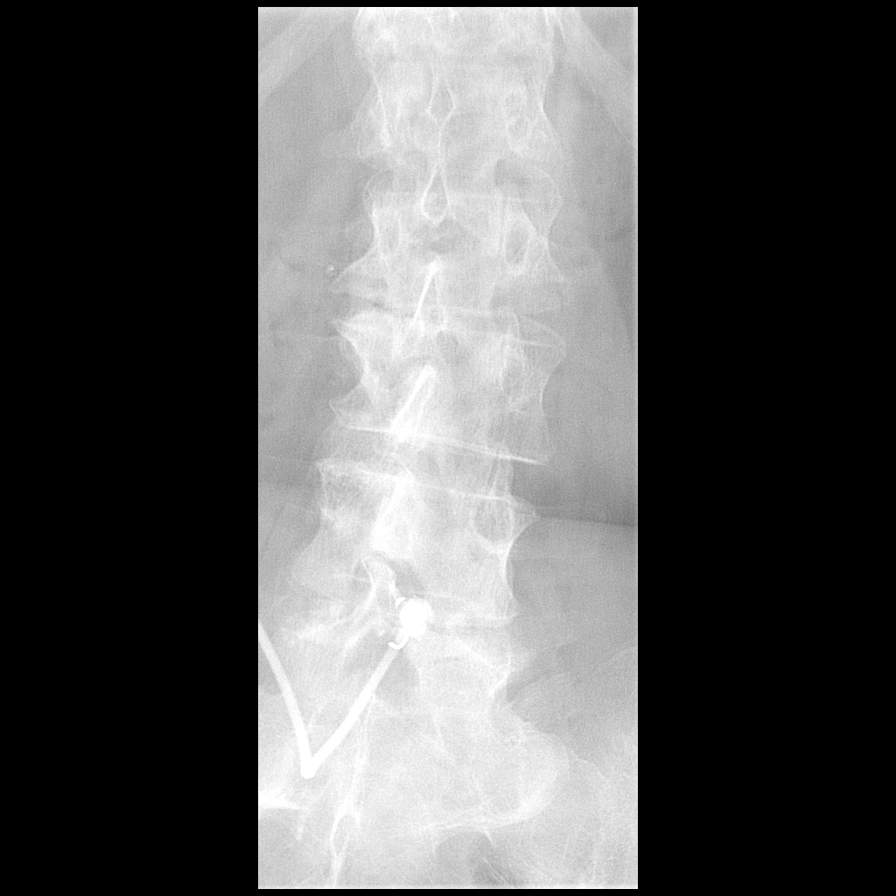

[Series 3: cp_standard · 0.17mm/px · 1 of 1 slices shown (2 of 5)]
[im 1/1]
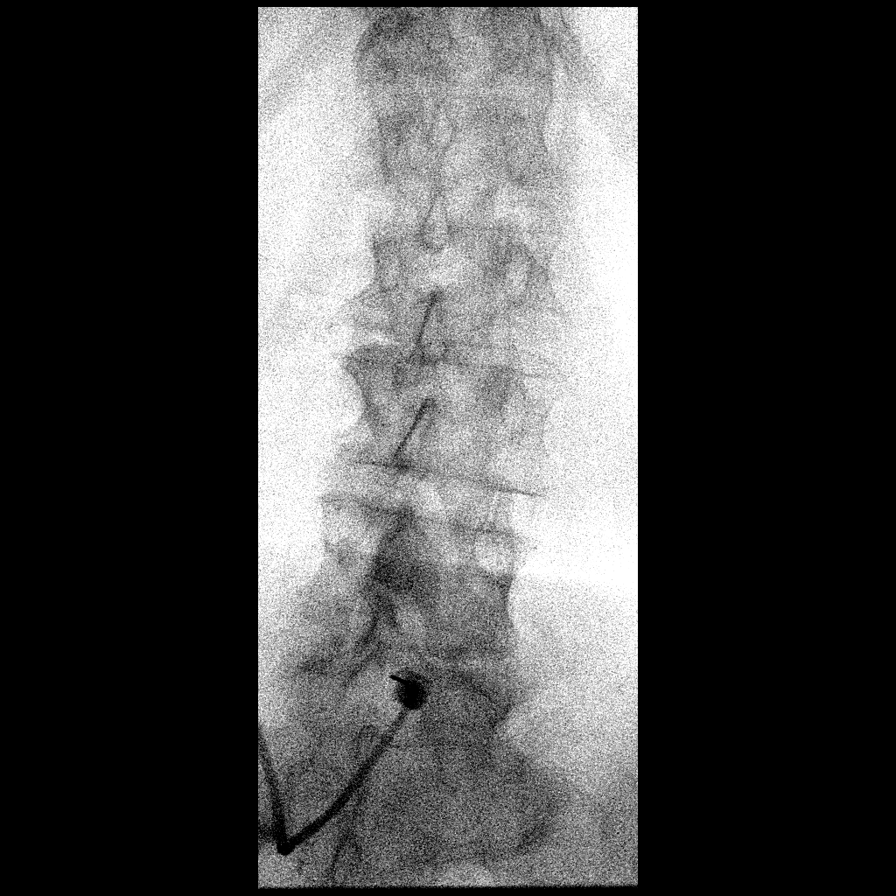

[Series 4: cp_standard · 0.17mm/px · 1 of 1 slices shown (3 of 5)]
[im 1/1]
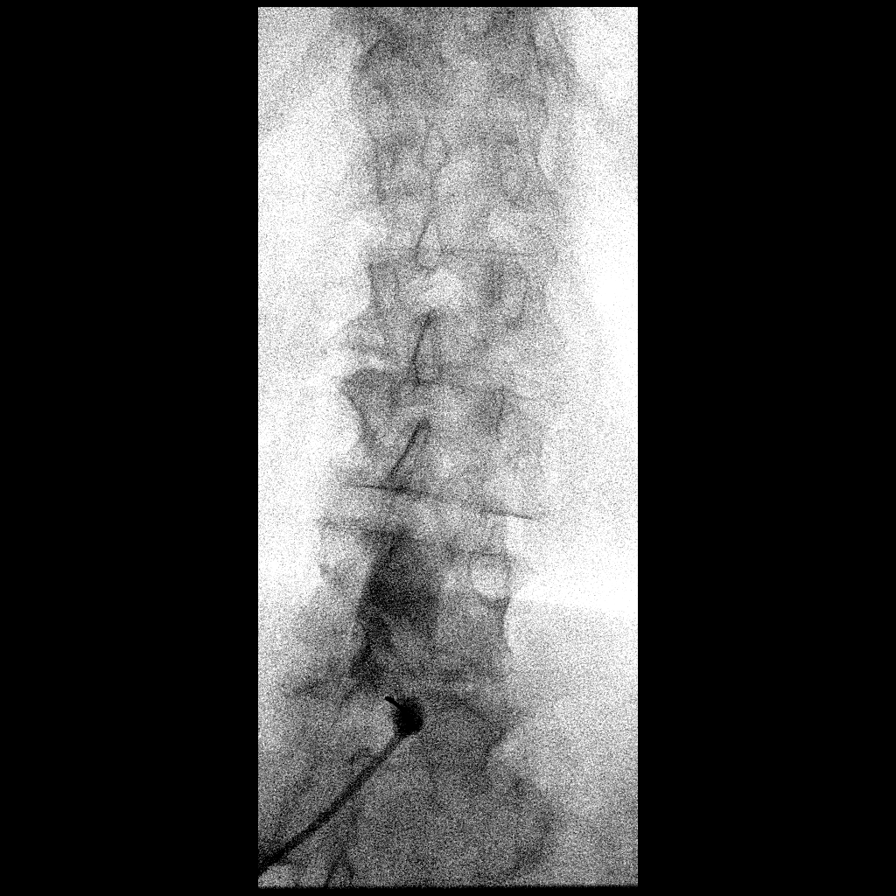

[Series 5: cp_standard · 0.17mm/px · 1 of 1 slices shown (4 of 5)]
[im 1/1]
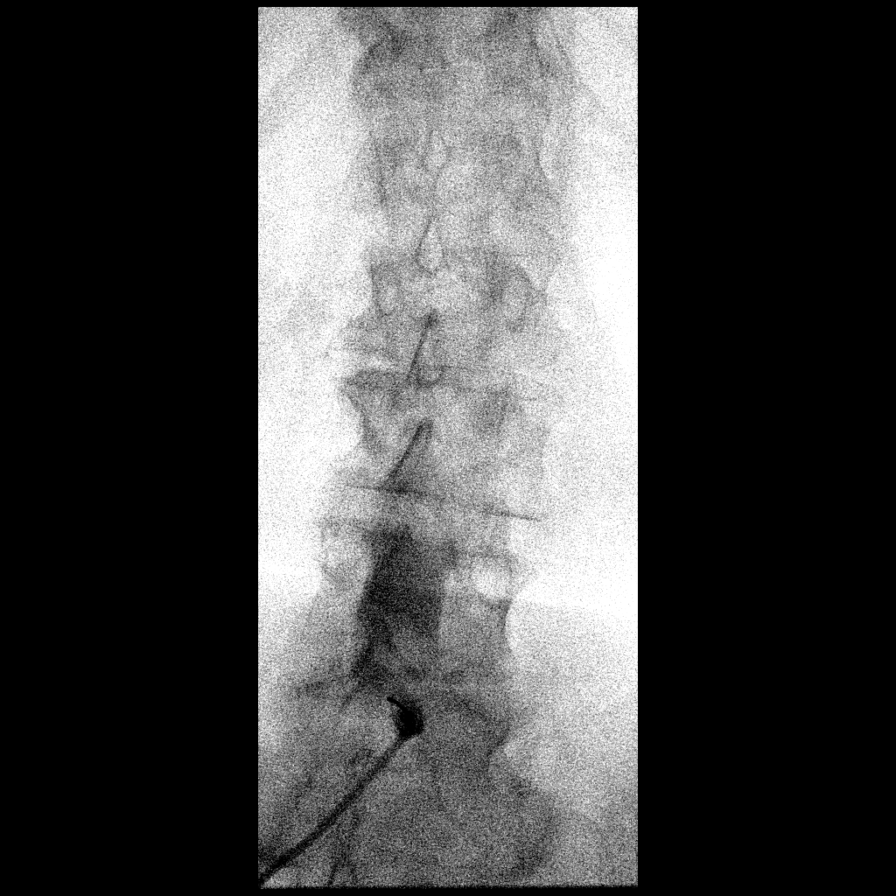

[Series 6: cp_standard · 0.17mm/px · 1 of 1 slices shown (5 of 5)]
[im 1/1]
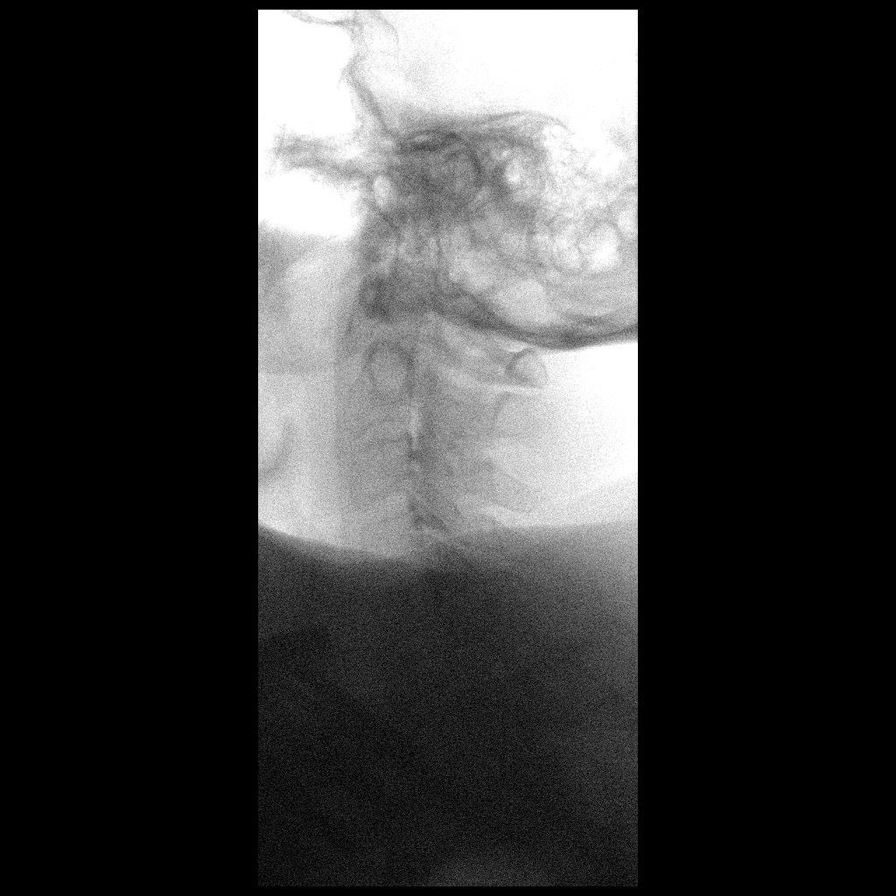

[7 of 7 positions shown; findings below may reference images not displayed]

FLUOROSCOPY TIME:  46.6 mGy air kerma.  2.6 minutes

PROCEDURE:
LUMBAR PUNCTURE FOR CISTERNOGRAM

Consent was obtained by Dr. TIGER.

Patient was positioned prone on the fluoroscopy table. Local
anesthesia was provided with 1% lidocaine without epinephrine after
prepped and draped in the usual sterile fashion. Puncture was
performed at L5-S1 using a 5 inch 20 gauge spinal needle via midline
approach. Using a single pass through the dura, the needle was
placed within the thecal sac, with return of clear CSF. 8 mL of
Isovue [I5] was injected into the thecal sac, with normal
opacification of the nerve roots and cauda equina consistent with
free flow within the subarachnoid space. The patient was then moved
to the trendelenburg position and contrast flowed into the basal
cisterns.

I personally performed the lumbar puncture and administered the
intrathecal contrast. I also personally supervised acquisition of
the cisternogram images.
FINDINGS: No diagnostic images of the face were acquired given the low
likelihood of seen CSF leakage compared to CT.
IMPRESSION: Successful lumbar puncture and intrathecal contrast opacification
for CT cisternogram.

## 2020-08-15 IMAGING — CT CT MAXILLOFACIAL W/ CM
3 series · 15 of 47 positions shown, 18 images · IV contrast (agent unspecified)
Comparison: Brain MRI [DATE]

CLINICAL DATA: CSF rhinorrhea on the right with meningitis.

EXAM:
CT MAXILLOFACIAL WITH CONTRAST; CT MAXILLOFACIAL WITHOUT CONTRAST
TECHNIQUE: Multidetector CT imaging of the maxillofacial structures was
performed without and with intrathecal contrast. Multiplanar CT
image reconstructions were also generated.
CONTRAST:  Intrathecal contrast, reference myelography report.

[Series 5: facialbone 2.0 st · axial · 0.39mm/px · z∈[-276,-116]mm · 9 of 94 slices shown, 12 images]
[im 7/94  brain]
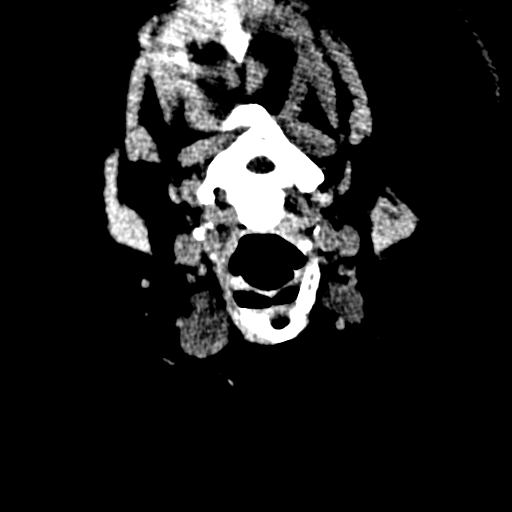
[im 7/94  bone]
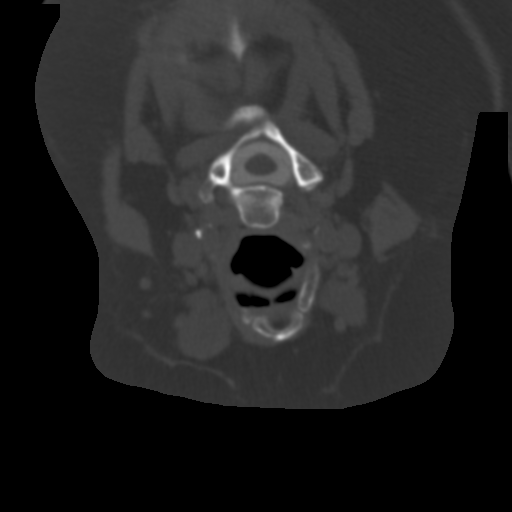
[im 17/94  bone]
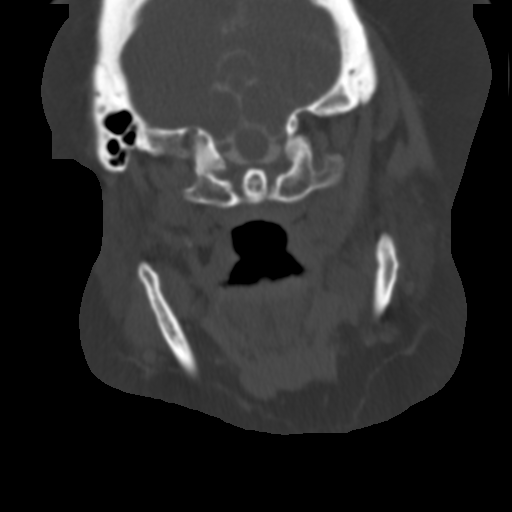
[im 26/94  bone]
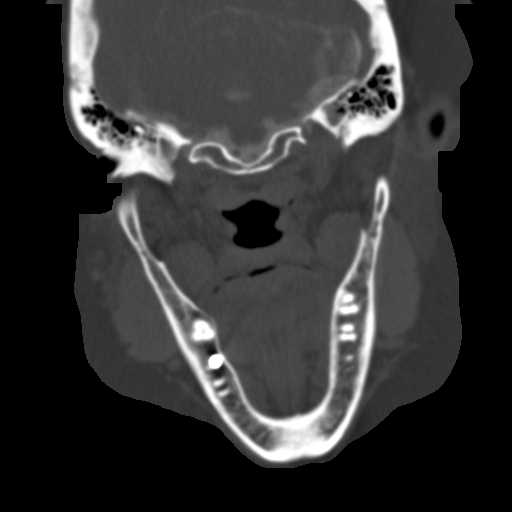
[im 36/94  bone]
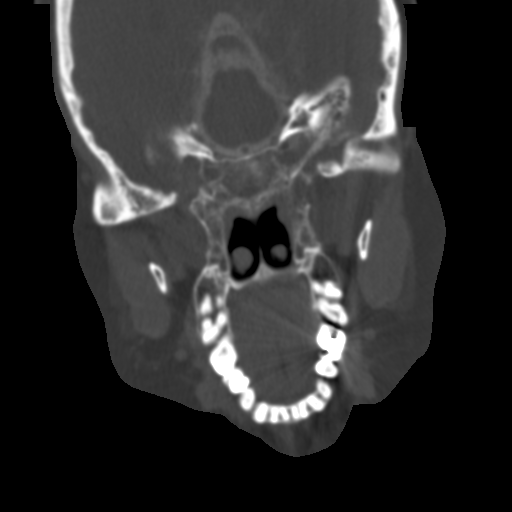
[im 49/94  brain]
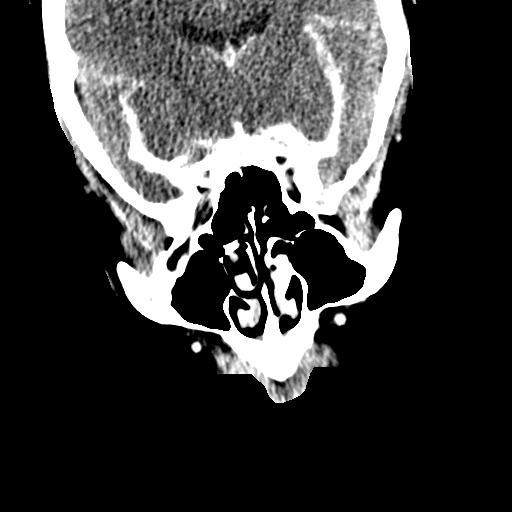
[im 49/94  bone]
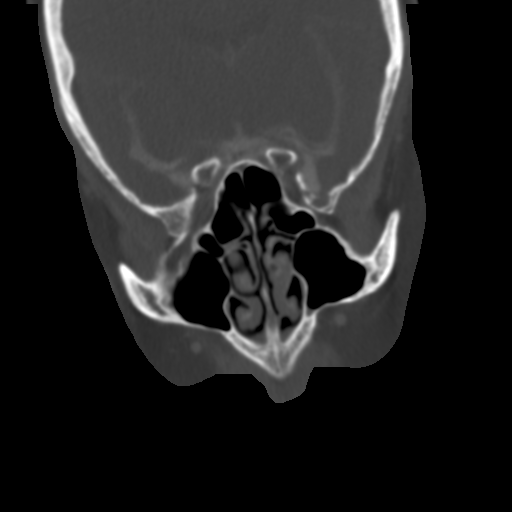
[im 58/94  bone]
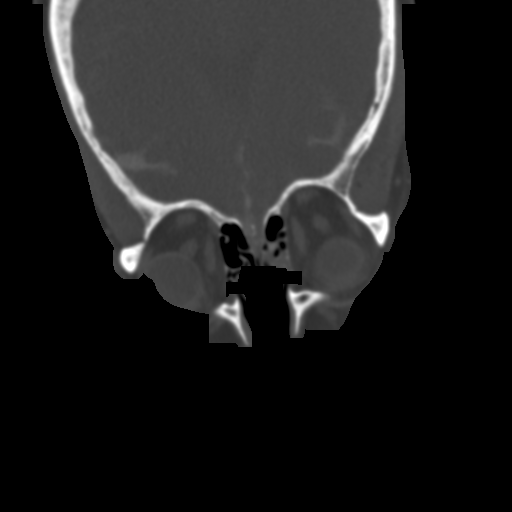
[im 68/94  bone]
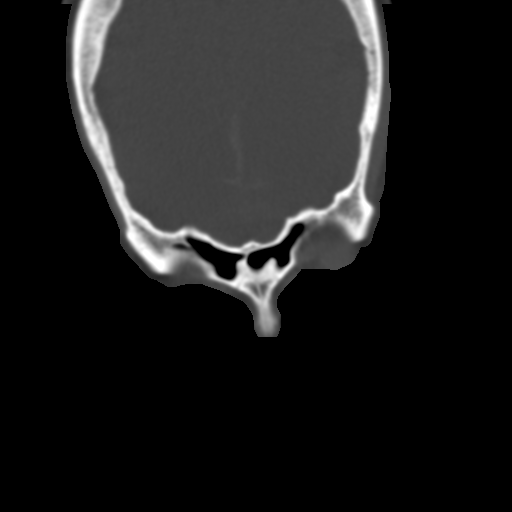
[im 77/94  bone]
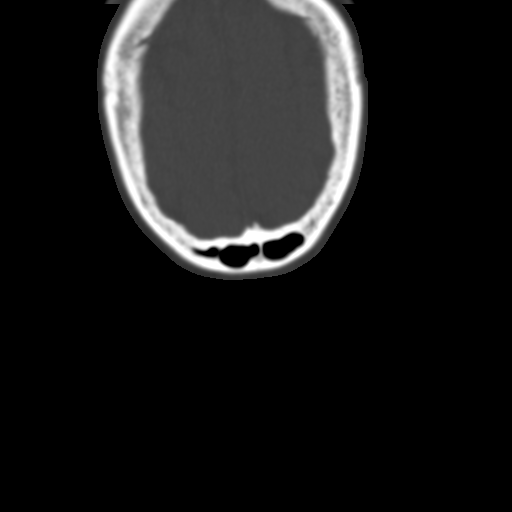
[im 87/94  brain]
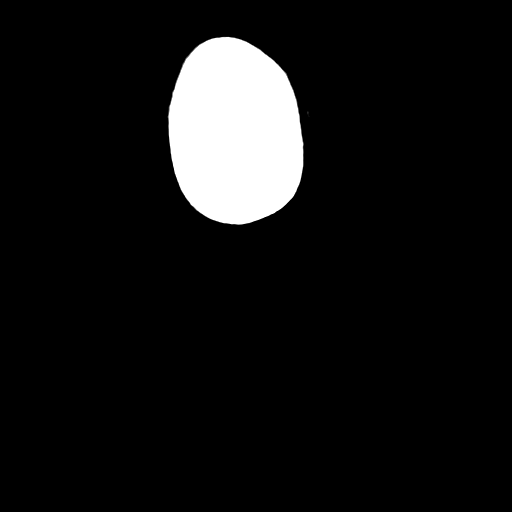
[im 87/94  bone]
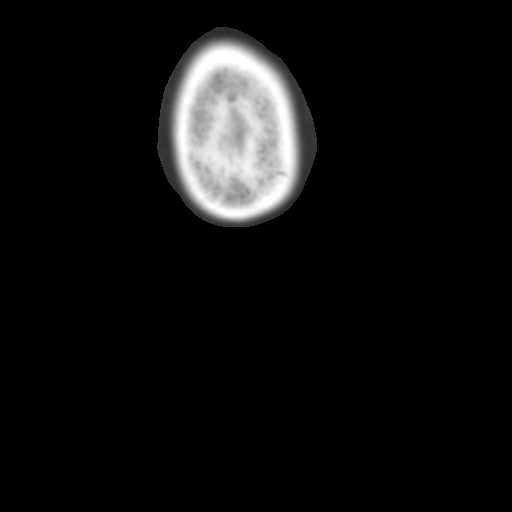

[Series 8: facialbone 2.0 sag st · sagittal · 0.34mm/px · 3 of 94 slices shown]
[im 32/94  bone]
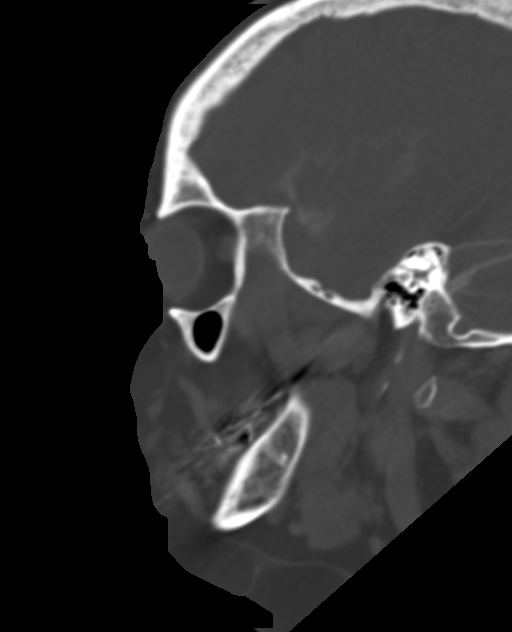
[im 47/94  bone]
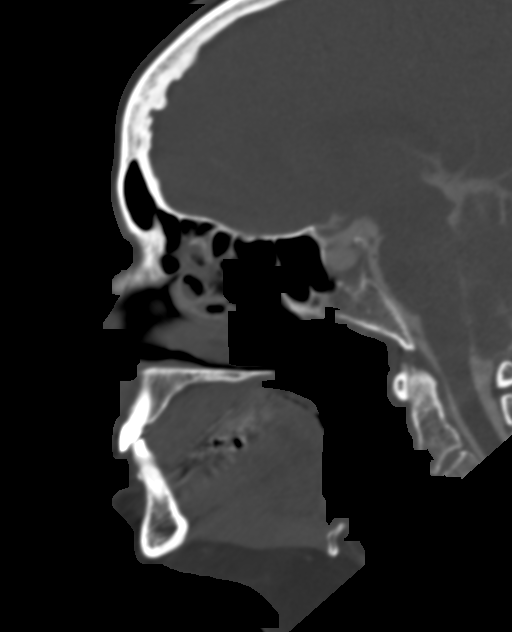
[im 63/94  bone]
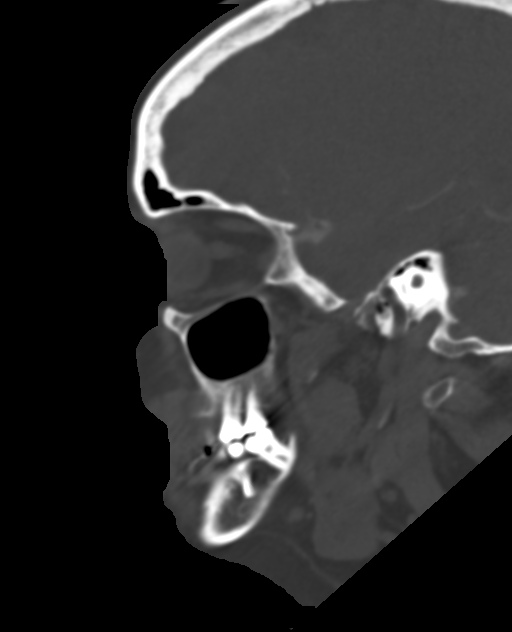

[Series 11: facialbone cor st thin · coronal · 0.18mm/px · 3 of 109 slices shown]
[im 37/109  bone]
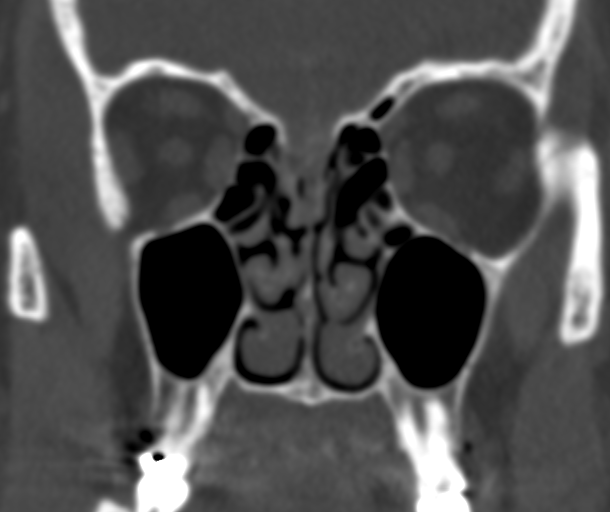
[im 49/109  bone]
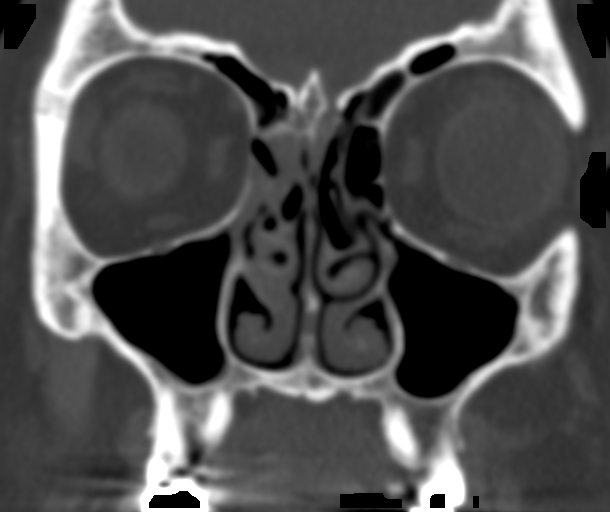
[im 61/109  bone]
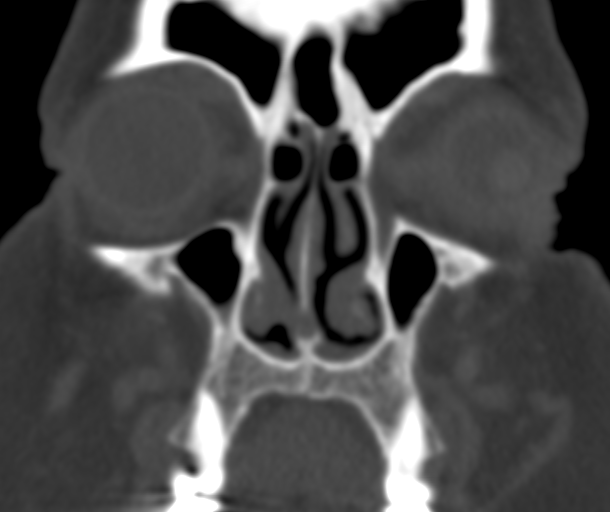

[15 of 47 positions shown; findings below may reference images not displayed]

FINDINGS: Pre and postcontrast images of the face were obtained. The patient
was actively leaking from the right nostril during the scan. On the
symptomatic right side there is a clear bony defect at the level of
the cribriform plate (a similar defect is seen on the left). At the
lateral ethmoid level, such as on [DATE], there is increased density
that is partially related to sclerosis but there could be subtle
adjacent increased density from leakage, especially on sagittal soft
tissue windows. There is notable decent of the right gyrus rectus
into the olfactory groove, also seen on prior coronal T2 weighted
imaging.

On the left, there is a notable area of lateral left sphenoid
thinning at the level of the distended left Meckel's cave.

Partially empty sella.
IMPRESSION: 1. Although good cisternal opacification and active rhinorrhea,
active contrast leakage was not detectable. Still, the scan likely
localizes the symptomatic site as there is only 1 area of bony
dehiscence on the symptomatic right side, at the cribriform plate.
At the same level, the right olfactory groove is asymmetrically
effaced by downward descent of the right gyrus rectus.
2. Imperceptible left posterolateral sphenoid sinus wall at the
level of the dilated left Meckel's cave, without contrast
extravasation. There left cribriform plate thinning/dehiscence
similar to the right.
3. Above and partially empty sella suggests there is underlying
chronic intracranial hypertension.

## 2020-08-15 IMAGING — CT CT MAXILLOFACIAL W/O CM
2 of 4 series · 10 of 30 positions shown, 12 images · IV contrast (agent unspecified)
Comparison: Brain MRI [DATE]

CLINICAL DATA: CSF rhinorrhea on the right with meningitis.

EXAM:
CT MAXILLOFACIAL WITH CONTRAST; CT MAXILLOFACIAL WITHOUT CONTRAST
TECHNIQUE: Multidetector CT imaging of the maxillofacial structures was
performed without and with intrathecal contrast. Multiplanar CT
image reconstructions were also generated.
CONTRAST:  Intrathecal contrast, reference myelography report.

[Series 4: bone 2.0 ax · oblique · 0.32mm/px · 7 of 104 slices shown, 9 images]
[im 13/104  brain]
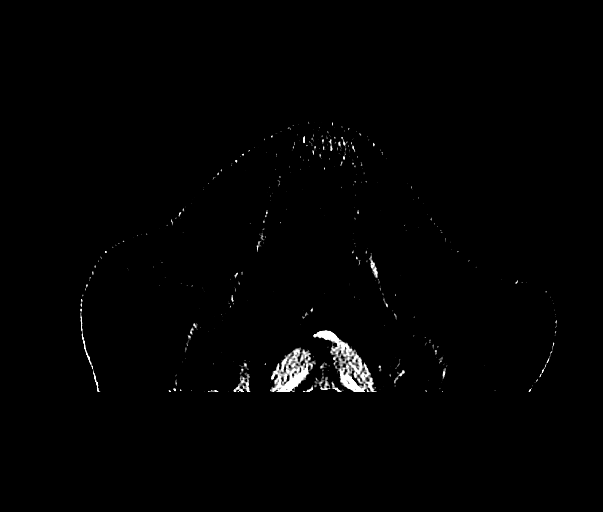
[im 13/104  bone]
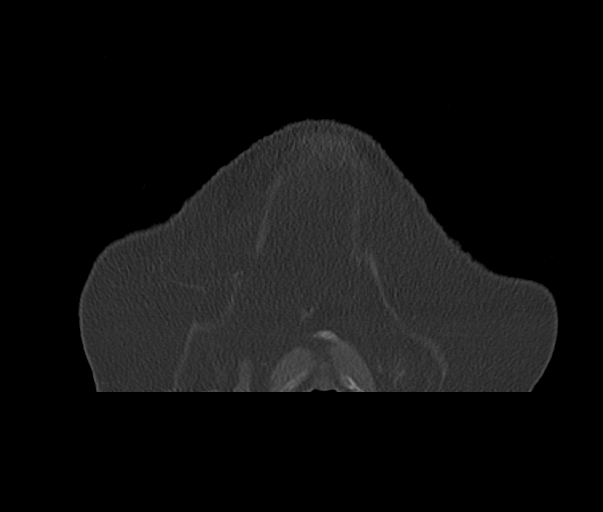
[im 26/104  bone]
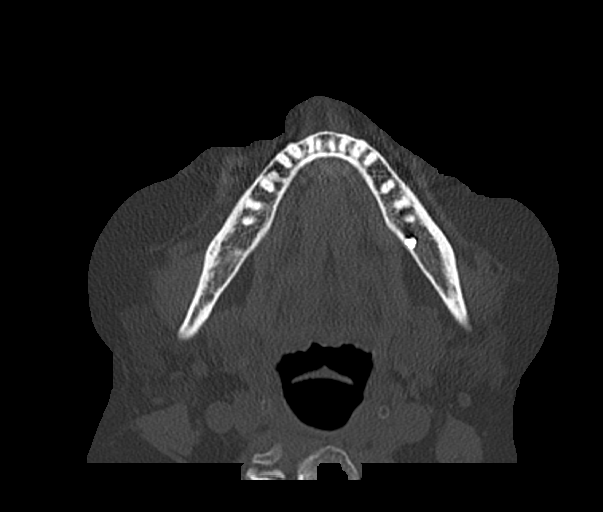
[im 39/104  bone]
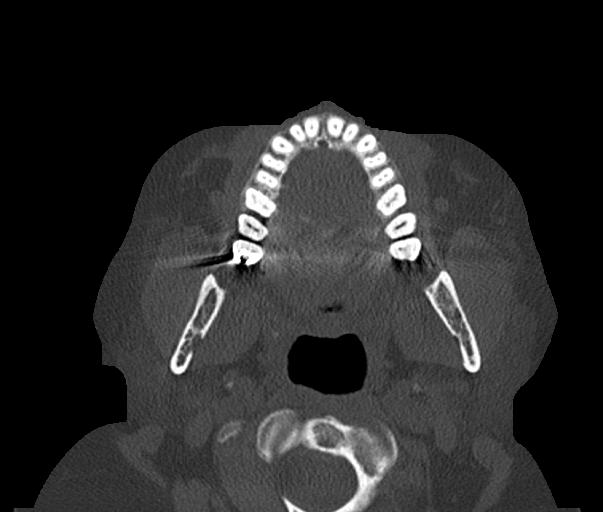
[im 52/104  bone]
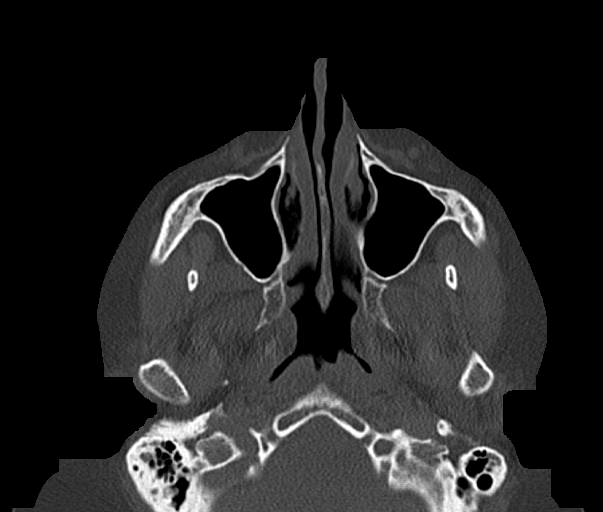
[im 65/104  brain]
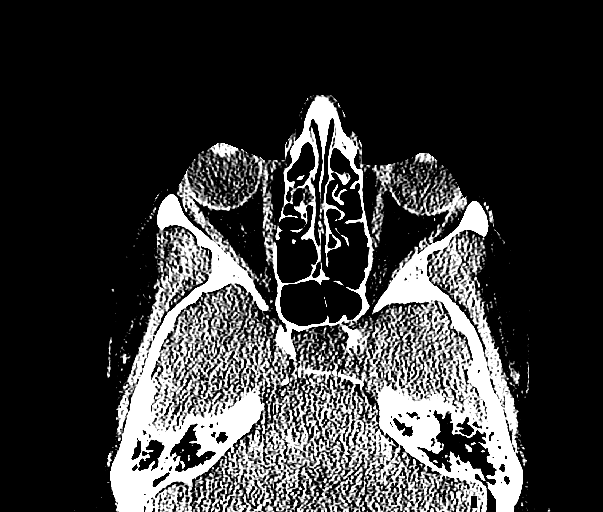
[im 65/104  bone]
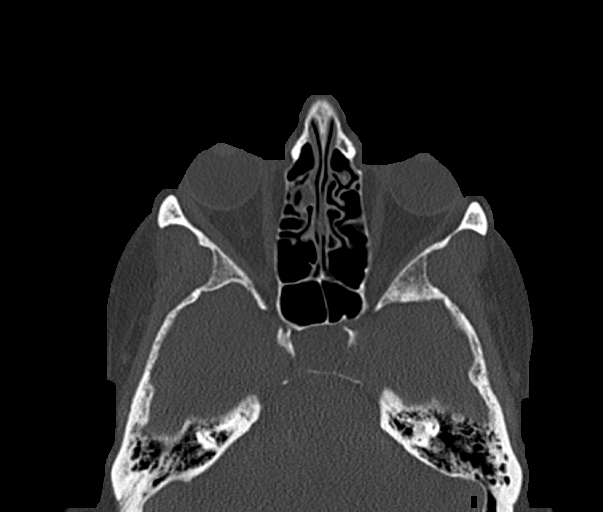
[im 78/104  bone]
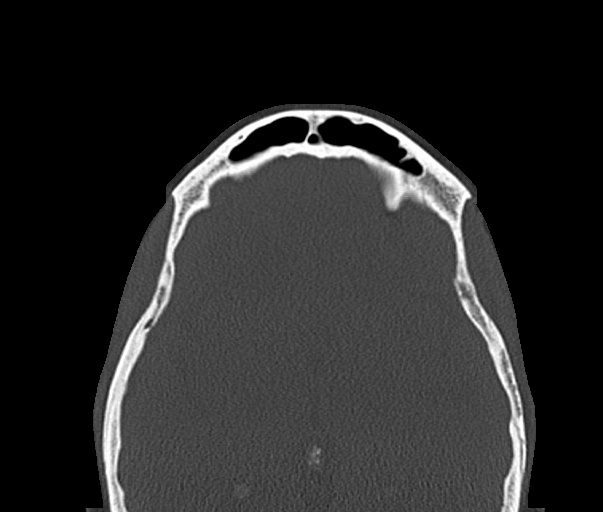
[im 91/104  bone]
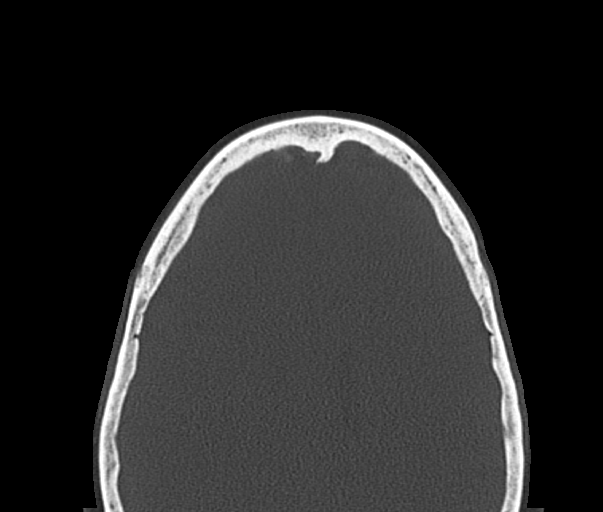

[Series 8: facialbone 2.0 sag st · sagittal · 0.31mm/px · 3 of 88 slices shown]
[im 30/88  bone]
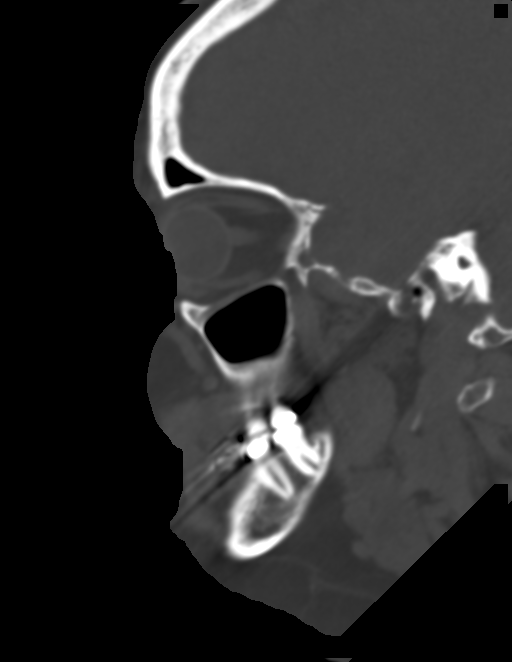
[im 44/88  bone]
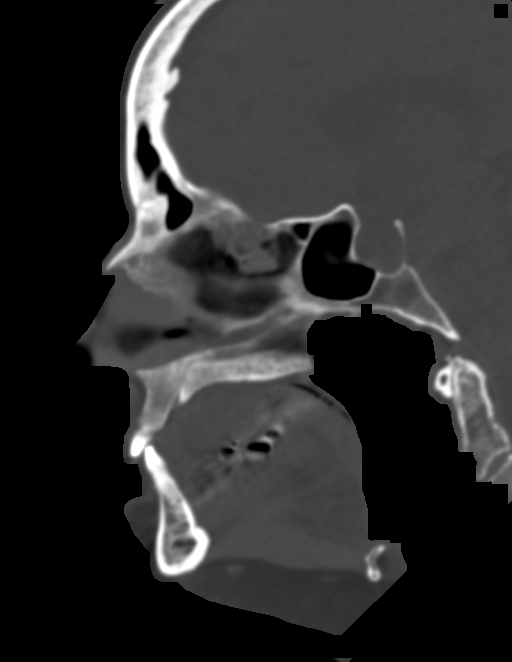
[im 59/88  bone]
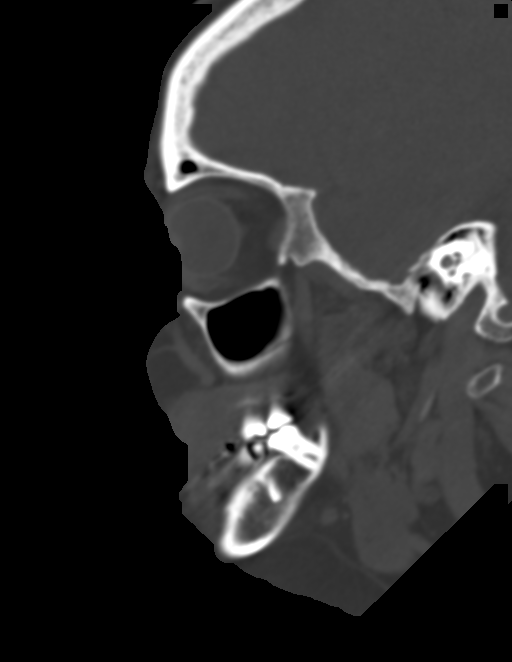

[10 of 30 positions shown; findings below may reference images not displayed]

FINDINGS: Pre and postcontrast images of the face were obtained. The patient
was actively leaking from the right nostril during the scan. On the
symptomatic right side there is a clear bony defect at the level of
the cribriform plate (a similar defect is seen on the left). At the
lateral ethmoid level, such as on [DATE], there is increased density
that is partially related to sclerosis but there could be subtle
adjacent increased density from leakage, especially on sagittal soft
tissue windows. There is notable decent of the right gyrus rectus
into the olfactory groove, also seen on prior coronal T2 weighted
imaging.

On the left, there is a notable area of lateral left sphenoid
thinning at the level of the distended left Meckel's cave.

Partially empty sella.
IMPRESSION: 1. Although good cisternal opacification and active rhinorrhea,
active contrast leakage was not detectable. Still, the scan likely
localizes the symptomatic site as there is only 1 area of bony
dehiscence on the symptomatic right side, at the cribriform plate.
At the same level, the right olfactory groove is asymmetrically
effaced by downward descent of the right gyrus rectus.
2. Imperceptible left posterolateral sphenoid sinus wall at the
level of the dilated left Meckel's cave, without contrast
extravasation. There left cribriform plate thinning/dehiscence
similar to the right.
3. Above and partially empty sella suggests there is underlying
chronic intracranial hypertension.

## 2020-08-15 MED ORDER — LIDOCAINE HCL (PF) 1 % IJ SOLN
5.0000 mL | Freq: Once | INTRAMUSCULAR | Status: AC
  Administered 2020-08-15: 5 mL via INTRADERMAL

## 2020-08-15 MED ORDER — IOHEXOL 300 MG/ML  SOLN
8.0000 mL | Freq: Once | INTRAMUSCULAR | Status: AC | PRN
  Administered 2020-08-15: 8 mL via INTRATHECAL

## 2020-08-15 NOTE — Procedures (Signed)
Successful LP at 5/1 for injection of 8cc Omni 300.   JWatts MD

## 2020-08-15 NOTE — Patient Outreach (Signed)
[facility]) Care Management  08/15/2020  Gail Edwards 25-Jul-1955 [id number]   Second telephone outreach attempt to obtain mRS. No answer. Left message for returned call.  Vanice Sarah [facility] Management Assistant [phone]

## 2020-08-19 ENCOUNTER — Other Ambulatory Visit: Payer: Self-pay

## 2020-08-19 NOTE — Patient Outreach (Signed)
[facility]) Care Management  08/19/2020  Gail Edwards 1955/03/09 [id number]   3 outreach attempts were completed to obtain mRs. mRs could not be obtained because patient never returned my calls. mRs=7    Vanice Sarah Care Management Assistant [phone]

## 2020-09-05 ENCOUNTER — Other Ambulatory Visit: Payer: Self-pay | Admitting: Student

## 2020-09-05 DIAGNOSIS — G96810 Intracranial hypotension, unspecified: Secondary | ICD-10-CM

## 2020-10-01 ENCOUNTER — Other Ambulatory Visit: Payer: Self-pay | Admitting: Neurological Surgery

## 2020-10-08 ENCOUNTER — Other Ambulatory Visit: Payer: Self-pay | Admitting: Otolaryngology

## 2020-10-14 ENCOUNTER — Other Ambulatory Visit: Payer: Self-pay | Admitting: Otolaryngology

## 2020-10-14 NOTE — Progress Notes (Signed)
Surgical Instructions    Your procedure is scheduled on Tuesday August 30th.  Report to [facility] Main Entrance "A" at 5:30 A.M., then check in with the Admitting office.  Call this number if you have problems the morning of surgery:  [phone]   If you have any questions prior to your surgery date call [phone]: Open Monday-Friday 8am-4pm    Remember:  Do not eat or drink anything after midnight the night before your surgery    Take these medicines the morning of surgery with A SIP OF WATER  emtricitabine-rilpivir-tenofovir AF (ODEFSEY) 200-25-25 MG TABS tablet  IF NEEDED acetaminophen (TYLENOL) 325 MG tablet  As of today, STOP taking any Aspirin (unless otherwise instructed by your surgeon) Aleve, Naproxen, Ibuprofen, Motrin, Advil, Goody's, BC's, all herbal medications, fish oil, and all vitamins.          Do not wear jewelry or makeup Do not wear lotions, powders, perfumes, or deodorant. Do not shave 48 hours prior to surgery.   Do not bring valuables to the hospital. DO Not wear nail polish, gel polish, artificial nails, or any other type of covering on  natural nails including finger and toenails. If patients have artificial nails, gel coating, etc. that need to be removed by a nail salon please have this removed prior to surgery or surgery may need to be canceled/delayed if the surgeon/ anesthesia feels like the patient is unable to be adequately monitored.             [facility] is not responsible for any belongings or valuables.  Do NOT Smoke (Tobacco/Vaping) or drink Alcohol 24 hours prior to your procedure If you use a CPAP at night, you may bring all equipment for your overnight stay.   Contacts, glasses, dentures or bridgework may not be worn into surgery, please bring cases for these belongings   For patients admitted to the hospital, discharge time will be determined by your treatment team.   Patients discharged the day of surgery will not be allowed to  drive home, and someone needs to stay with them for 24 hours.  ONLY 1 SUPPORT PERSON MAY BE PRESENT WHILE YOU ARE IN SURGERY. IF YOU ARE TO BE ADMITTED ONCE YOU ARE IN YOUR ROOM YOU WILL BE ALLOWED TWO (2) VISITORS.  Minor children may have two parents present. Special consideration for safety and communication needs will be reviewed on a case by case basis.  Special instructions:    Oral Hygiene is also important to reduce your risk of infection.  Remember - BRUSH YOUR TEETH THE MORNING OF SURGERY WITH YOUR REGULAR TOOTHPASTE   [facility]- Preparing For Surgery  Before surgery, you can play an important role. Because skin is not sterile, your skin needs to be as free of germs as possible. You can reduce the number of germs on your skin by washing with CHG (chlorahexidine gluconate) Soap before surgery.  CHG is an antiseptic cleaner which kills germs and bonds with the skin to continue killing germs even after washing.     Please do not use if you have an allergy to CHG or antibacterial soaps. If your skin becomes reddened/irritated stop using the CHG.  Do not shave (including legs and underarms) for at least 48 hours prior to first CHG shower. It is OK to shave your face.  Please follow these instructions carefully.     Shower the NIGHT BEFORE SURGERY and the MORNING OF SURGERY with CHG Soap.   If you  chose to wash your hair, wash your hair first as usual with your normal shampoo. After you shampoo, rinse your hair and body thoroughly to remove the shampoo.  Then [employer] and genitals (private parts) with your normal soap and rinse thoroughly to remove soap.  After that Use CHG Soap as you would any other liquid soap. You can apply CHG directly to the skin and wash gently with a scrungie or a clean washcloth.   Apply the CHG Soap to your body ONLY FROM THE NECK DOWN.  Do not use on open wounds or open sores. Avoid contact with your eyes, ears, mouth and genitals (private parts). Wash Face  and genitals (private parts)  with your normal soap.   Wash thoroughly, paying special attention to the area where your surgery will be performed.  Thoroughly rinse your body with warm water from the neck down.  DO NOT shower/wash with your normal soap after using and rinsing off the CHG Soap.  Pat yourself dry with a CLEAN TOWEL.  Wear CLEAN PAJAMAS to bed the night before surgery  Place CLEAN SHEETS on your bed the night before your surgery  DO NOT SLEEP WITH PETS.   Day of Surgery:  Take a shower with CHG soap. Wear Clean/Comfortable clothing the morning of surgery Do not apply any deodorants/lotions.   Remember to brush your teeth WITH YOUR REGULAR TOOTHPASTE.   Please read over the following fact sheets that you were given.

## 2020-10-15 ENCOUNTER — Encounter (HOSPITAL_COMMUNITY): Payer: Self-pay

## 2020-10-15 ENCOUNTER — Encounter (HOSPITAL_COMMUNITY)
Admission: RE | Admit: 2020-10-15 | Discharge: 2020-10-15 | Disposition: A | Discharge status: Home / Self Care | Payer: 59 | Source: Ambulatory Visit | Attending: Neurological Surgery | Admitting: Neurological Surgery

## 2020-10-15 ENCOUNTER — Other Ambulatory Visit: Payer: Self-pay

## 2020-10-15 DIAGNOSIS — Z01812 Encounter for preprocedural laboratory examination: Secondary | ICD-10-CM | POA: Insufficient documentation

## 2020-10-15 LAB — CBC
HCT: 42.9 % (ref 36.0–46.0)
Hemoglobin: 13.4 g/dL (ref 12.0–15.0)
MCH: 28.9 pg (ref 26.0–34.0)
MCHC: 31.2 g/dL (ref 30.0–36.0)
MCV: 92.7 fL (ref 80.0–100.0)
Platelets: 345 10*3/uL (ref 150–400)
RBC: 4.63 MIL/uL (ref 3.87–5.11)
RDW: 14.1 % (ref 11.5–15.5)
WBC: 8.6 10*3/uL (ref 4.0–10.5)
nRBC: 0 % (ref 0.0–0.2)

## 2020-10-15 LAB — BASIC METABOLIC PANEL
Anion gap: 5 (ref 5–15)
BUN: 17 mg/dL (ref 8–23)
CO2: 29 mmol/L (ref 22–32)
Calcium: 9.2 mg/dL (ref 8.9–10.3)
Chloride: 106 mmol/L (ref 98–111)
Creatinine, Ser: 0.84 mg/dL (ref 0.44–1.00)
GFR, Estimated: >60 mL/min (ref >60)
Glucose, Bld: 163 mg/dL — ABNORMAL HIGH (ref 70–99)
Potassium: 4.7 mmol/L (ref 3.5–5.1)
Sodium: 140 mmol/L (ref 135–145)

## 2020-10-15 LAB — SURGICAL PCR SCREEN
MRSA, PCR: NEGATIVE
Staphylococcus aureus: NEGATIVE

## 2020-10-15 NOTE — Progress Notes (Signed)
PCP - Dr. Chales Salmon with [facility] at [facility] - Denies  Chest x-ray - Not indicated EKG - 04/30/20 Stress Test - Denies ECHO - 05/01/20 Cardiac Cath - Denies  Sleep Study - Denies  DM - Denies  COVID TEST- August 26th    Anesthesia review: No  Patient denies shortness of breath, fever, cough and chest pain at PAT appointment   All instructions explained to the patient, with a verbal understanding of the material. Patient agrees to go over the instructions while at home for a better understanding. Patient also instructed to wear a mask while in public after being tested for COVID-19. The opportunity to ask questions was provided.

## 2020-10-17 ENCOUNTER — Other Ambulatory Visit: Payer: Self-pay | Admitting: Neurological Surgery

## 2020-10-18 LAB — SARS CORONAVIRUS 2 (TAT 6-24 HRS): SARS Coronavirus 2: NEGATIVE

## 2020-10-20 ENCOUNTER — Other Ambulatory Visit: Payer: Self-pay | Admitting: Internal Medicine

## 2020-10-20 DIAGNOSIS — Z1231 Encounter for screening mammogram for malignant neoplasm of breast: Secondary | ICD-10-CM

## 2020-10-20 NOTE — Anesthesia Preprocedure Evaluation (Addendum)
Anesthesia Evaluation  Patient identified by MRN, date of birth, ID band Patient awake    Reviewed: Allergy & Precautions, NPO status , Patient's Chart, lab work & pertinent test results  History of Anesthesia Complications Negative for: history of anesthetic complications  Airway Mallampati: II  TM Distance: >3 FB Neck ROM: Full    Dental  (+) Dental Advisory Given, Teeth Intact   Pulmonary neg pulmonary ROS,    Pulmonary exam normal        Cardiovascular hypertension, Pt. on medications Normal cardiovascular exam   '22 TTE - EF 55 to 60%. Mild concentric left ventricular hypertrophy. Trivial MR. Mild aortic valve sclerosis is present, with no evidence of aortic valve stenosis.     Neuro/Psych  CSF leak  negative psych ROS   GI/Hepatic negative GI ROS, Neg liver ROS,   Endo/Other  Morbid obesity Pre-DM   Renal/GU negative Renal ROS     Musculoskeletal negative musculoskeletal ROS (+)   Abdominal (+) + obese,   Peds  Hematology  (+) HIV,   Anesthesia Other Findings   Reproductive/Obstetrics                            Anesthesia Physical Anesthesia Plan  ASA: 3  Anesthesia Plan: General   Post-op Pain Management:    Induction: Intravenous  PONV Risk Score and Plan: 4 or greater and Treatment may vary due to age or medical condition, Ondansetron, Dexamethasone, Midazolam and Scopolamine patch - Pre-op  Airway Management Planned: Oral ETT  Additional Equipment: None  Intra-op Plan:   Post-operative Plan: Extubation in OR  Informed Consent: I have reviewed the patients History and Physical, chart, labs and discussed the procedure including the risks, benefits and alternatives for the proposed anesthesia with the patient or authorized representative who has indicated his/her understanding and acceptance.     Dental advisory given  Plan Discussed with: CRNA and  Anesthesiologist  Anesthesia Plan Comments:        Anesthesia Quick Evaluation

## 2020-10-21 ENCOUNTER — Inpatient Hospital Stay (HOSPITAL_COMMUNITY): Payer: 59 | Admitting: Certified Registered"

## 2020-10-21 ENCOUNTER — Encounter (HOSPITAL_COMMUNITY): Payer: Self-pay | Admitting: Neurological Surgery

## 2020-10-21 ENCOUNTER — Encounter (HOSPITAL_COMMUNITY)
Admission: RE | Disposition: A | Discharge status: Home / Self Care | Payer: Self-pay | Source: Home / Self Care | Attending: Neurological Surgery

## 2020-10-21 ENCOUNTER — Inpatient Hospital Stay (HOSPITAL_COMMUNITY): Payer: 59

## 2020-10-21 ENCOUNTER — Inpatient Hospital Stay (HOSPITAL_COMMUNITY)
Admission: RE | Admit: 2020-10-21 | Discharge: 2020-10-25 | DRG: 026 | Disposition: A | Discharge status: Home / Self Care | Payer: 59 | Attending: Neurological Surgery | Admitting: Neurological Surgery

## 2020-10-21 ENCOUNTER — Other Ambulatory Visit: Payer: Self-pay

## 2020-10-21 DIAGNOSIS — Z419 Encounter for procedure for purposes other than remedying health state, unspecified: Secondary | ICD-10-CM

## 2020-10-21 DIAGNOSIS — G9600 Cerebrospinal fluid leak, unspecified: Secondary | ICD-10-CM | POA: Diagnosis present

## 2020-10-21 DIAGNOSIS — E559 Vitamin D deficiency, unspecified: Secondary | ICD-10-CM | POA: Diagnosis present

## 2020-10-21 DIAGNOSIS — G9608 Other cranial cerebrospinal fluid leak: Principal | ICD-10-CM | POA: Diagnosis present

## 2020-10-21 DIAGNOSIS — Z8661 Personal history of infections of the central nervous system: Secondary | ICD-10-CM

## 2020-10-21 DIAGNOSIS — G4700 Insomnia, unspecified: Secondary | ICD-10-CM | POA: Diagnosis present

## 2020-10-21 DIAGNOSIS — Z833 Family history of diabetes mellitus: Secondary | ICD-10-CM

## 2020-10-21 DIAGNOSIS — B20 Human immunodeficiency virus [HIV] disease: Secondary | ICD-10-CM | POA: Diagnosis present

## 2020-10-21 DIAGNOSIS — Z885 Allergy status to narcotic agent status: Secondary | ICD-10-CM

## 2020-10-21 DIAGNOSIS — E119 Type 2 diabetes mellitus without complications: Secondary | ICD-10-CM | POA: Diagnosis present

## 2020-10-21 DIAGNOSIS — R0981 Nasal congestion: Secondary | ICD-10-CM | POA: Diagnosis present

## 2020-10-21 DIAGNOSIS — J342 Deviated nasal septum: Secondary | ICD-10-CM | POA: Diagnosis present

## 2020-10-21 DIAGNOSIS — I10 Essential (primary) hypertension: Secondary | ICD-10-CM | POA: Diagnosis present

## 2020-10-21 HISTORY — PX: TRANSPHENOIDAL APPROACH EXPOSURE: SHX6311

## 2020-10-21 HISTORY — PX: PLACEMENT OF LUMBAR DRAIN: SHX6028

## 2020-10-21 HISTORY — PX: CRANIOTOMY: SHX93

## 2020-10-21 HISTORY — PX: ENDOSCOPIC TRANS NASAL APPROACH WITH FUSION: SHX6750

## 2020-10-21 LAB — GLUCOSE, CAPILLARY
Glucose-Capillary: 129 mg/dL — ABNORMAL HIGH (ref 70–99)
Glucose-Capillary: 156 mg/dL — ABNORMAL HIGH (ref 70–99)

## 2020-10-21 LAB — CBC
HCT: 41.7 % (ref 36.0–46.0)
Hemoglobin: 13.6 g/dL (ref 12.0–15.0)
MCH: 29.1 pg (ref 26.0–34.0)
MCHC: 32.6 g/dL (ref 30.0–36.0)
MCV: 89.3 fL (ref 80.0–100.0)
Platelets: 331 10*3/uL (ref 150–400)
RBC: 4.67 MIL/uL (ref 3.87–5.11)
RDW: 14 % (ref 11.5–15.5)
WBC: 11.9 10*3/uL — ABNORMAL HIGH (ref 4.0–10.5)
nRBC: 0 % (ref 0.0–0.2)

## 2020-10-21 LAB — CREATININE, SERUM
Creatinine, Ser: 0.96 mg/dL (ref 0.44–1.00)
GFR, Estimated: >60 mL/min (ref >60)

## 2020-10-21 IMAGING — RF DG LUMBAR SPINE 1V
1 series · 1 of 1 positions shown · non-contrast
Comparison: [DATE].

CLINICAL DATA: Surgery, elective [21] ([21]-CM)

EXAM:
LUMBAR SPINE - 1 VIEW

[Series 1: run · 1 of 1 slices shown]
[im 1/1]
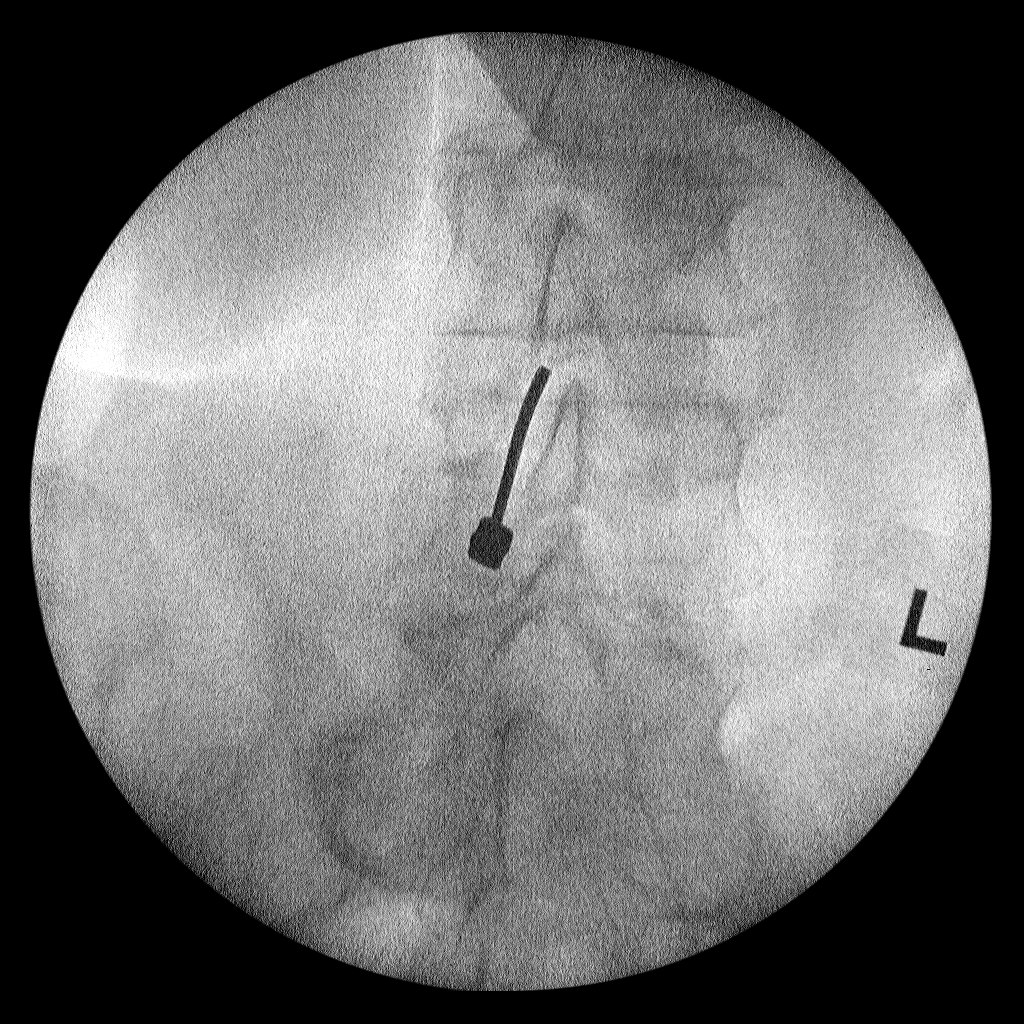

[1 of 1 positions shown; findings below may reference images not displayed]

FINDINGS: Fluoro time: 16 seconds.

A single C-arm image was obtained intraoperatively and submitted for
post operative interpretation. This image demonstrates a needle with
the tip projecting at the right paramidline L3-L4 level. Please see
the performing provider's procedural report for further detail.
IMPRESSION: Intraoperative radiograph, as detailed above.

## 2020-10-21 SURGERY — CRANIOTOMY HYPOPHYSECTOMY TRANSNASAL APPROACH
Anesthesia: General | Site: Nose

## 2020-10-21 MED ORDER — HEPARIN SODIUM (PORCINE) 5000 UNIT/ML IJ SOLN
5000.0000 [IU] | Freq: Three times a day (TID) | INTRAMUSCULAR | Status: AC
Start: 2020-10-23 — End: 2020-10-24
  Administered 2020-10-23 – 2020-10-24 (×6): 5000 [IU] via SUBCUTANEOUS
  Filled 2020-10-21 (×6): qty 1

## 2020-10-21 MED ORDER — FENTANYL CITRATE (PF) 100 MCG/2ML IJ SOLN: 25.0000 ug | INTRAMUSCULAR | Status: DC | PRN

## 2020-10-21 MED ORDER — EPHEDRINE SULFATE-NACL 50-0.9 MG/10ML-% IV SOSY
PREFILLED_SYRINGE | INTRAVENOUS | Status: DC | PRN
  Administered 2020-10-21 (×2): 10 mg via INTRAVENOUS

## 2020-10-21 MED ORDER — CLOBETASOL PROPIONATE 0.05 % EX OINT: 1.0000 "application " | TOPICAL_OINTMENT | Freq: Two times a day (BID) | CUTANEOUS | Status: DC | PRN

## 2020-10-21 MED ORDER — CALCIUM CARBONATE ANTACID 500 MG PO CHEW
1500.0000 mg | CHEWABLE_TABLET | Freq: Every day | ORAL | Status: DC
  Administered 2020-10-22 – 2020-10-24 (×3): 1500 mg via ORAL
  Filled 2020-10-21 (×2): qty 3
  Filled 2020-10-21: qty 8
  Filled 2020-10-21: qty 3
  Filled 2020-10-21: qty 8
  Filled 2020-10-21: qty 3

## 2020-10-21 MED ORDER — CHLORHEXIDINE GLUCONATE CLOTH 2 % EX PADS: 6.0000 | MEDICATED_PAD | Freq: Once | CUTANEOUS | Status: DC

## 2020-10-21 MED ORDER — LIDOCAINE-EPINEPHRINE 1 %-1:100000 IJ SOLN
INTRAMUSCULAR | Status: DC | PRN
  Administered 2020-10-21: 9 mL

## 2020-10-21 MED ORDER — CEFAZOLIN SODIUM-DEXTROSE 2-4 GM/100ML-% IV SOLN
2.0000 g | Freq: Three times a day (TID) | INTRAVENOUS | Status: AC
Start: 2020-10-21 — End: 2020-10-22
  Administered 2020-10-21 – 2020-10-22 (×2): 2 g via INTRAVENOUS
  Filled 2020-10-21 (×2): qty 100

## 2020-10-21 MED ORDER — CHLORHEXIDINE GLUCONATE 0.12 % MT SOLN
15.0000 mL | Freq: Once | OROMUCOSAL | Status: AC
  Administered 2020-10-21: 15 mL via OROMUCOSAL
  Filled 2020-10-21: qty 15

## 2020-10-21 MED ORDER — ROCURONIUM BROMIDE 10 MG/ML (PF) SYRINGE
PREFILLED_SYRINGE | INTRAVENOUS | Status: DC | PRN
Start: 2020-10-21 — End: 2020-10-21
  Administered 2020-10-21: 80 mg via INTRAVENOUS

## 2020-10-21 MED ORDER — PHENYLEPHRINE HCL-NACL 20-0.9 MG/250ML-% IV SOLN
INTRAVENOUS | Status: DC | PRN
  Administered 2020-10-21: 25 ug/min via INTRAVENOUS

## 2020-10-21 MED ORDER — PROMETHAZINE HCL 25 MG/ML IJ SOLN: 6.2500 mg | INTRAMUSCULAR | Status: DC | PRN

## 2020-10-21 MED ORDER — MUPIROCIN CALCIUM 2 % EX CREA
TOPICAL_CREAM | CUTANEOUS | Status: AC
  Filled 2020-10-21: qty 15

## 2020-10-21 MED ORDER — THROMBIN 5000 UNITS EX SOLR
CUTANEOUS | Status: AC
  Filled 2020-10-21: qty 10000

## 2020-10-21 MED ORDER — CEFAZOLIN SODIUM-DEXTROSE 2-4 GM/100ML-% IV SOLN
2.0000 g | INTRAVENOUS | Status: AC
  Administered 2020-10-21: 2 g via INTRAVENOUS
  Filled 2020-10-21: qty 100

## 2020-10-21 MED ORDER — DEXAMETHASONE SODIUM PHOSPHATE 10 MG/ML IJ SOLN
10.0000 mg | Freq: Once | INTRAMUSCULAR | Status: AC
  Administered 2020-10-21: 10 mg via INTRAVENOUS
  Filled 2020-10-21: qty 1

## 2020-10-21 MED ORDER — THROMBIN 5000 UNITS EX SOLR: OROMUCOSAL | Status: DC | PRN

## 2020-10-21 MED ORDER — EMTRICITAB-RILPIVIR-TENOFOV AF 200-25-25 MG PO TABS
1.0000 | ORAL_TABLET | Freq: Every day | ORAL | Status: DC
  Administered 2020-10-22 – 2020-10-25 (×4): 1 via ORAL
  Filled 2020-10-21 (×4): qty 1

## 2020-10-21 MED ORDER — LOSARTAN POTASSIUM 50 MG PO TABS
100.0000 mg | ORAL_TABLET | Freq: Every day | ORAL | Status: DC
  Administered 2020-10-21 – 2020-10-25 (×5): 100 mg via ORAL
  Filled 2020-10-21 (×5): qty 2

## 2020-10-21 MED ORDER — ONDANSETRON HCL 4 MG PO TABS: 4.0000 mg | ORAL_TABLET | ORAL | Status: DC | PRN

## 2020-10-21 MED ORDER — OXYCODONE HCL 5 MG PO TABS: 5.0000 mg | ORAL_TABLET | Freq: Once | ORAL | Status: DC | PRN

## 2020-10-21 MED ORDER — ONDANSETRON HCL 4 MG/2ML IJ SOLN
INTRAMUSCULAR | Status: DC | PRN
  Administered 2020-10-21: 4 mg via INTRAVENOUS

## 2020-10-21 MED ORDER — OXYMETAZOLINE HCL 0.05 % NA SOLN
NASAL | Status: DC | PRN
  Administered 2020-10-21: 1 via TOPICAL

## 2020-10-21 MED ORDER — MIDAZOLAM HCL 2 MG/2ML IJ SOLN
INTRAMUSCULAR | Status: AC
  Filled 2020-10-21: qty 2

## 2020-10-21 MED ORDER — LIDOCAINE 2% (20 MG/ML) 5 ML SYRINGE
INTRAMUSCULAR | Status: DC | PRN
Start: 2020-10-21 — End: 2020-10-21
  Administered 2020-10-21: 80 mg via INTRAVENOUS

## 2020-10-21 MED ORDER — HYDROCODONE-ACETAMINOPHEN 5-325 MG PO TABS: 1.0000 | ORAL_TABLET | ORAL | Status: DC | PRN

## 2020-10-21 MED ORDER — TRIAMCINOLONE ACETONIDE 40 MG/ML IJ SUSP
INTRAMUSCULAR | Status: DC | PRN
  Administered 2020-10-21: 40 mg

## 2020-10-21 MED ORDER — OXYMETAZOLINE HCL 0.05 % NA SOLN
NASAL | Status: AC
  Filled 2020-10-21: qty 30

## 2020-10-21 MED ORDER — LIDOCAINE-EPINEPHRINE 1 %-1:100000 IJ SOLN
INTRAMUSCULAR | Status: AC
  Filled 2020-10-21: qty 1

## 2020-10-21 MED ORDER — PROPOFOL 10 MG/ML IV BOLUS
INTRAVENOUS | Status: DC | PRN
  Administered 2020-10-21: 150 mg via INTRAVENOUS
  Administered 2020-10-21: 20 mg via INTRAVENOUS

## 2020-10-21 MED ORDER — CEPHALEXIN 500 MG PO CAPS: 500.0000 mg | ORAL_CAPSULE | Freq: Three times a day (TID) | ORAL | 0 refills | Status: DC

## 2020-10-21 MED ORDER — SODIUM CHLORIDE 0.9 % IV SOLN
INTRAVENOUS | Status: DC | PRN
  Administered 2020-10-21: 500 mL via INTRAVENOUS

## 2020-10-21 MED ORDER — TRIAMCINOLONE ACETONIDE 40 MG/ML IJ SUSP
INTRAMUSCULAR | Status: AC
  Filled 2020-10-21: qty 5

## 2020-10-21 MED ORDER — PROPOFOL 10 MG/ML IV BOLUS
INTRAVENOUS | Status: AC
  Filled 2020-10-21: qty 20

## 2020-10-21 MED ORDER — SODIUM CHLORIDE 0.9 % IR SOLN
Status: DC | PRN
  Administered 2020-10-21: 1000 mL

## 2020-10-21 MED ORDER — 0.9 % SODIUM CHLORIDE (POUR BTL) OPTIME
TOPICAL | Status: DC | PRN
  Administered 2020-10-21: 1000 mL

## 2020-10-21 MED ORDER — POLYETHYLENE GLYCOL 3350 17 G PO PACK: 17.0000 g | PACK | Freq: Every day | ORAL | Status: DC | PRN

## 2020-10-21 MED ORDER — ACETAMINOPHEN 650 MG RE SUPP: 650.0000 mg | RECTAL | Status: DC | PRN

## 2020-10-21 MED ORDER — MIDAZOLAM HCL 2 MG/2ML IJ SOLN
INTRAMUSCULAR | Status: DC | PRN
Start: 2020-10-21 — End: 2020-10-21
  Administered 2020-10-21: 2 mg via INTRAVENOUS

## 2020-10-21 MED ORDER — SUFENTANIL CITRATE 50 MCG/ML IV SOLN
INTRAVENOUS | Status: AC
  Filled 2020-10-21: qty 1

## 2020-10-21 MED ORDER — MUPIROCIN CALCIUM 2 % EX CREA
TOPICAL_CREAM | CUTANEOUS | Status: DC | PRN
  Administered 2020-10-21: 1 via TOPICAL

## 2020-10-21 MED ORDER — SODIUM CHLORIDE (PF) 0.9 % IJ SOLN
INTRAMUSCULAR | Status: AC
  Filled 2020-10-21: qty 10

## 2020-10-21 MED ORDER — SCOPOLAMINE 1 MG/3DAYS TD PT72
MEDICATED_PATCH | TRANSDERMAL | Status: AC
  Filled 2020-10-21: qty 1

## 2020-10-21 MED ORDER — ONDANSETRON HCL 4 MG/2ML IJ SOLN: 4.0000 mg | INTRAMUSCULAR | Status: DC | PRN

## 2020-10-21 MED ORDER — PROMETHAZINE HCL 12.5 MG PO TABS
12.5000 mg | ORAL_TABLET | ORAL | Status: DC | PRN
  Filled 2020-10-21: qty 2

## 2020-10-21 MED ORDER — SUFENTANIL CITRATE 50 MCG/ML IV SOLN
INTRAVENOUS | Status: DC | PRN
  Administered 2020-10-21: 5 ug via INTRAVENOUS
  Administered 2020-10-21: 15 ug via INTRAVENOUS
  Administered 2020-10-21: 10 ug via INTRAVENOUS

## 2020-10-21 MED ORDER — ORAL CARE MOUTH RINSE: 15.0000 mL | Freq: Once | OROMUCOSAL | Status: AC

## 2020-10-21 MED ORDER — FLUORESCEIN SODIUM 10 % IV SOLN
INTRAVENOUS | Status: DC | PRN
  Administered 2020-10-21: 1 mL via INTRAVENOUS

## 2020-10-21 MED ORDER — TEMAZEPAM 15 MG PO CAPS: 15.0000 mg | ORAL_CAPSULE | Freq: Every evening | ORAL | Status: DC | PRN

## 2020-10-21 MED ORDER — DOCUSATE SODIUM 100 MG PO CAPS
100.0000 mg | ORAL_CAPSULE | Freq: Two times a day (BID) | ORAL | Status: DC
  Administered 2020-10-21 – 2020-10-25 (×8): 100 mg via ORAL
  Filled 2020-10-21 (×8): qty 1

## 2020-10-21 MED ORDER — BACITRACIN ZINC 500 UNIT/GM EX OINT
TOPICAL_OINTMENT | CUTANEOUS | Status: AC
  Filled 2020-10-21: qty 28.35

## 2020-10-21 MED ORDER — LACTATED RINGERS IV SOLN: INTRAVENOUS | Status: DC

## 2020-10-21 MED ORDER — HYDROMORPHONE HCL 1 MG/ML IJ SOLN: 0.5000 mg | INTRAMUSCULAR | Status: DC | PRN

## 2020-10-21 MED ORDER — OXYCODONE HCL 5 MG/5ML PO SOLN: 5.0000 mg | Freq: Once | ORAL | Status: DC | PRN

## 2020-10-21 MED ORDER — SALINE SPRAY 0.65 % NA SOLN
4.0000 | Freq: Four times a day (QID) | NASAL | Status: DC
  Administered 2020-10-22 – 2020-10-25 (×12): 4 via NASAL
  Filled 2020-10-21 (×2): qty 44

## 2020-10-21 MED ORDER — ACETAMINOPHEN 325 MG PO TABS
650.0000 mg | ORAL_TABLET | ORAL | Status: DC | PRN
  Administered 2020-10-21 – 2020-10-23 (×3): 650 mg via ORAL
  Filled 2020-10-21 (×3): qty 2

## 2020-10-21 MED ORDER — LABETALOL HCL 5 MG/ML IV SOLN: 10.0000 mg | INTRAVENOUS | Status: DC | PRN

## 2020-10-21 SURGICAL SUPPLY — 115 items
ATTRACTOMAT 16X20 MAGNETIC DRP (DRAPES) ×3 IMPLANT
BAG COUNTER SPONGE SURGICOUNT (BAG) ×9 IMPLANT
BAND RUBBER #18 3X1/16 STRL (MISCELLANEOUS) ×6 IMPLANT
BENZOIN TINCTURE PRP APPL 2/3 (GAUZE/BANDAGES/DRESSINGS) ×3 IMPLANT
BLADE CLIPPER SURG (BLADE) IMPLANT
BLADE RAD40 ROTATE 4M 4 5PK (BLADE) IMPLANT
BLADE ROTATE RAD 40 4 M4 (BLADE) ×3 IMPLANT
BLADE ROTATE TRICUT 4X13 M4 (BLADE) ×3 IMPLANT
BLADE SURG 10 STRL SS (BLADE) ×3 IMPLANT
BLADE SURG 11 STRL SS (BLADE) ×6 IMPLANT
BLADE SURG 15 STRL LF DISP TIS (BLADE) ×4 IMPLANT
BLADE SURG 15 STRL SS (BLADE) ×6
BUR DIAMOND 13X5 70D (BURR) IMPLANT
BUR DIAMOND CURV 15X5 15D (BURR) IMPLANT
BUR TAPER CHOANAL ATRESIA 30K (BURR) IMPLANT
CABLE BIPOLOR RESECTION CORD (MISCELLANEOUS) ×3 IMPLANT
CANISTER SUC SOCK COL 7IN (MISCELLANEOUS) ×3 IMPLANT
CANISTER SUCT 3000ML PPV (MISCELLANEOUS) ×9 IMPLANT
CATH LUMBAR HERMETIC 14G (CATHETERS) ×2 IMPLANT
CATHETER LUMBAR HERMETIC 14G (CATHETERS) ×3
COAGULATOR SUCT 8FR VV (MISCELLANEOUS) ×3 IMPLANT
COVER BACK TABLE 60X90IN (DRAPES) IMPLANT
COVER MAYO STAND STRL (DRAPES) ×3 IMPLANT
DERMABOND ADVANCED (GAUZE/BANDAGES/DRESSINGS) ×1
DERMABOND ADVANCED .7 DNX12 (GAUZE/BANDAGES/DRESSINGS) ×2 IMPLANT
DRAIN SUBARACHNOID (WOUND CARE) ×3 IMPLANT
DRAPE C-ARM 42X72 X-RAY (DRAPES) ×6 IMPLANT
DRAPE HALF SHEET 40X57 (DRAPES) ×9 IMPLANT
DRAPE INCISE IOBAN 66X45 STRL (DRAPES) ×3 IMPLANT
DRAPE LAPAROTOMY 100X72X124 (DRAPES) ×3 IMPLANT
DRAPE MICROSCOPE LEICA (MISCELLANEOUS) IMPLANT
DRAPE SURG 17X23 STRL (DRAPES) ×12 IMPLANT
DRESSING NASAL POPE 10X1.5X2.5 (GAUZE/BANDAGES/DRESSINGS) ×2 IMPLANT
DRSG NASAL POPE 10X1.5X2.5 (GAUZE/BANDAGES/DRESSINGS) ×3
DRSG NASOPORE 8CM (GAUZE/BANDAGES/DRESSINGS) ×3 IMPLANT
DURAPREP 26ML APPLICATOR (WOUND CARE) ×6 IMPLANT
ELECT COATED BLADE 2.86 ST (ELECTRODE) IMPLANT
ELECT NEEDLE TIP 2.8 STRL (NEEDLE) ×3 IMPLANT
ELECT REM PT RETURN 9FT ADLT (ELECTROSURGICAL) ×6
ELECTRODE REM PT RTRN 9FT ADLT (ELECTROSURGICAL) ×4 IMPLANT
GAUZE 4X4 16PLY ~~LOC~~+RFID DBL (SPONGE) ×3 IMPLANT
GAUZE PACKING FOLDED 2  STR (GAUZE/BANDAGES/DRESSINGS) ×3
GAUZE PACKING FOLDED 2 STR (GAUZE/BANDAGES/DRESSINGS) ×2 IMPLANT
GAUZE SPONGE 2X2 8PLY STRL LF (GAUZE/BANDAGES/DRESSINGS) ×2 IMPLANT
GAUZE SPONGE 4X4 12PLY STRL (GAUZE/BANDAGES/DRESSINGS) ×3 IMPLANT
GLOVE EXAM NITRILE LRG STRL (GLOVE) IMPLANT
GLOVE EXAM NITRILE XL STR (GLOVE) IMPLANT
GLOVE EXAM NITRILE XS STR PU (GLOVE) IMPLANT
GLOVE SURG ENC TEXT LTX SZ7 (GLOVE) ×6 IMPLANT
GLOVE SURG LTX SZ7.5 (GLOVE) ×3 IMPLANT
GLOVE SURG UNDER POLY LF SZ7.5 (GLOVE) ×9 IMPLANT
GOWN STRL REUS W/ TWL LRG LVL3 (GOWN DISPOSABLE) ×6 IMPLANT
GOWN STRL REUS W/ TWL XL LVL3 (GOWN DISPOSABLE) IMPLANT
GOWN STRL REUS W/TWL 2XL LVL3 (GOWN DISPOSABLE) ×3 IMPLANT
GOWN STRL REUS W/TWL LRG LVL3 (GOWN DISPOSABLE) ×9
GOWN STRL REUS W/TWL XL LVL3 (GOWN DISPOSABLE)
GRAFT DURAGEN MATRIX 1WX1L (Tissue) ×3 IMPLANT
HEMOSTAT POWDER KIT SURGIFOAM (HEMOSTASIS) ×3 IMPLANT
HEMOSTAT SURGICEL 2X14 (HEMOSTASIS) ×3 IMPLANT
KIT BASIN OR (CUSTOM PROCEDURE TRAY) ×9 IMPLANT
KIT DRAIN CSF ACCUDRAIN (MISCELLANEOUS) ×3 IMPLANT
KIT TURNOVER KIT B (KITS) ×9 IMPLANT
KNIFE ARACHNOID DISP AM-21-S (BLADE) IMPLANT
NEEDLE EPID 14G 6 TOUHY (NEEDLE) ×3 IMPLANT
NEEDLE HYPO 25GX1X1/2 BEV (NEEDLE) ×3 IMPLANT
NEEDLE HYPO 25X1 1.5 SAFETY (NEEDLE) ×6 IMPLANT
NEEDLE SPNL 22GX3.5 QUINCKE BK (NEEDLE) ×3 IMPLANT
NEEDLE SPNL 25GX3.5 QUINCKE BL (NEEDLE) ×3 IMPLANT
NS IRRIG 1000ML POUR BTL (IV SOLUTION) ×9 IMPLANT
PACK LAMINECTOMY NEURO (CUSTOM PROCEDURE TRAY) ×3 IMPLANT
PAD ARMBOARD 7.5X6 YLW CONV (MISCELLANEOUS) ×18 IMPLANT
PATTIES SURGICAL .25X.25 (GAUZE/BANDAGES/DRESSINGS) IMPLANT
PATTIES SURGICAL .5 X.5 (GAUZE/BANDAGES/DRESSINGS) ×3 IMPLANT
PATTIES SURGICAL .5 X3 (DISPOSABLE) ×3 IMPLANT
PENCIL BUTTON HOLSTER BLD 10FT (ELECTRODE) ×3 IMPLANT
PROBE FOR NEUROSURGERY (MISCELLANEOUS) IMPLANT
SEALANT ADHERUS EXTEND TIP (MISCELLANEOUS) ×3 IMPLANT
SET WALTER ACTIVATION W/DRAPE (SET/KITS/TRAYS/PACK) ×3 IMPLANT
SHEATH ENDOSCRUB 0 DEG (SHEATH) ×3 IMPLANT
SHEATH ENDOSCRUB 30 DEG (SHEATH) IMPLANT
SHEATH ENDOSCRUB 45 DEG (SHEATH) ×3 IMPLANT
SPECIMEN JAR SMALL (MISCELLANEOUS) IMPLANT
SPLINT NASAL DOYLE BI-VL (GAUZE/BANDAGES/DRESSINGS) IMPLANT
SPONGE GAUZE 2X2 STER 10/PKG (GAUZE/BANDAGES/DRESSINGS) ×1
SPONGE NEURO XRAY DETECT 1X3 (DISPOSABLE) ×3 IMPLANT
SPONGE SURGIFOAM ABS GEL SZ50 (HEMOSTASIS) IMPLANT
SPONGE T-LAP 4X18 ~~LOC~~+RFID (SPONGE) ×3 IMPLANT
STAPLER SKIN PROX WIDE 3.9 (STAPLE) ×3 IMPLANT
STRIP CLOSURE SKIN 1/2X4 (GAUZE/BANDAGES/DRESSINGS) ×3 IMPLANT
SUT 5.0 PDS RB-1 (SUTURE)
SUT BONE WAX W31G (SUTURE) ×3 IMPLANT
SUT ETHILON 3 0 FSL (SUTURE) IMPLANT
SUT ETHILON 3 0 PS 1 (SUTURE) IMPLANT
SUT ETHILON 6 0 P 1 (SUTURE) IMPLANT
SUT MNCRL AB 3-0 PS2 27 (SUTURE) ×3 IMPLANT
SUT PDS AB 4-0 RB1 27 (SUTURE) IMPLANT
SUT PDS PLUS AB 5-0 RB-1 (SUTURE) IMPLANT
SUT PLAIN 4 0 ~~LOC~~ 1 (SUTURE) IMPLANT
SUT SILK 2 0 PERMA HAND 18 BK (SUTURE) ×3 IMPLANT
SUT SILK 2 0 TIES 10X30 (SUTURE) ×3 IMPLANT
SUT VIC AB 2-0 CP2 18 (SUTURE) ×3 IMPLANT
SUT VIC AB 4-0 P-3 18X BRD (SUTURE) IMPLANT
SUT VIC AB 4-0 P3 18 (SUTURE)
SYR CONTROL 10ML LL (SYRINGE) ×9 IMPLANT
TOWEL GREEN STERILE (TOWEL DISPOSABLE) ×6 IMPLANT
TOWEL GREEN STERILE FF (TOWEL DISPOSABLE) ×9 IMPLANT
TRACKER ENT INSTRUMENT (MISCELLANEOUS) ×3 IMPLANT
TRACKER ENT PATIENT (MISCELLANEOUS) ×3 IMPLANT
TRAP SPECIMEN MUCUS 40CC (MISCELLANEOUS) IMPLANT
TRAY ENT MC OR (CUSTOM PROCEDURE TRAY) ×6 IMPLANT
TRAY FOLEY MTR SLVR 16FR STAT (SET/KITS/TRAYS/PACK) ×3 IMPLANT
TUBE CONNECTING 12X1/4 (SUCTIONS) ×3 IMPLANT
TUBING EXTENTION W/L.L. (IV SETS) ×3 IMPLANT
TUBING STRAIGHTSHOT EPS 5PK (TUBING) ×3 IMPLANT
WATER STERILE IRR 1000ML POUR (IV SOLUTION) ×6 IMPLANT

## 2020-10-21 NOTE — Op Note (Signed)
PATIENT: Gail Edwards  DAY OF SURGERY: 10/21/20   PRE-OPERATIVE DIAGNOSIS:  Anterior skull base CSF leak   POST-OPERATIVE DIAGNOSIS:  Same   PROCEDURE:  Placement of lumbar drain with injection of fluorescein for localization of CSF leak, endonasal endoscopic repair of anterior skull base CSF leak   SURGEON:  Surgeon(s) and Role:    Jadene Pierini, MD - Co-surgeon    Osborn Coho, MD - Co-surgeon   ANESTHESIA: ETGA   BRIEF HISTORY: This is a 65 year old woman who presented with meningitis 2/2 a CSF leak in the anterior skull base. After she recovered, it began leaking again, I therefore recommended an endonasal repair with LD placement. This was discussed with the patient as well as risks, benefits, and alternatives and wished to proceed with surgery.   OPERATIVE DETAIL: The patient was taken to the operating room and placed on the OR table in the supine position. A formal time out was performed with two patient identifiers and confirmed the operative site. Anesthesia was induced by the anesthesia team.   The lumbar drain was placed first. The patient was turned and placed in the right lateral decubitus position and fluoroscopy was used to guide a 14ga Tuohy needle into the lumbar cistern. The stylet was removed, catheter was threaded without resistance. Of note, the opening pressure did appear  subjectively elevated with very brisk flow of clear CSF. The needle was removed, catheter was sutured in place with a plastic stay, and secured with sterile dressing. CSF was drained off, mixed, and 5mg  of fluorescein was then injected into the catheter, flushed with 5cc, and clamped off. Dr. then performed the endonasal approach.  Abdominal fat and rectus fascia grafts were harvested in the usual fashion with a transverse incision, selection of an appropriate size graft of abdominal fat, circumferential dissection around the graft, and removal. Dissection was taken down to the  anterior rectus sheath, where a small piece of fascia was harvested. The fascia was then closed with suture to cover the defect and this incision was thoroughly irrigated and closed in layers.  Dr. Annalee Genta was able to identify the leak site at the lateral cribriform with obvious CSF egress. We dissected the mucosa around the defect and identified the borders, then began the repair. I was able to mobilize the lateral, anterior, and posterior epidural space, but (as expected) it was difficult to mobilize medially. It was a very narrow defect and too small to perform a structural / Annalee Genta. I therefore inserted a piece of duragen as an inlay, which occluded the defect, then sealed this with Adheris fibrin sealant. Valsalva showed no leak and the patient's lumbar drain was reopened to allow for protection of the repair. Dr. Soil scientist continued with the flap placement and closure.   EBL:  55mL   DRAINS: none   SPECIMENS: none   45m, MD 10/21/20 8:56 AM

## 2020-10-21 NOTE — Transfer of Care (Signed)
Immediate Anesthesia Transfer of Care Note  Patient: Gail Edwards  Procedure(s) Performed: Endoscopic Endonasal CSF leak repair PLACEMENT OF LUMBAR DRAIN TRANSPHENOIDAL APPROACH EXPOSURE ENDOSCOPIC TRANS NASAL APPROACH WITH FUSION  Patient Location: PACU  Anesthesia Type:General  Level of Consciousness: drowsy and patient cooperative  Airway & Oxygen Therapy: Patient Spontanous Breathing and Patient connected to nasal cannula oxygen  Post-op Assessment: Report given to RN and Post -op Vital signs reviewed and stable  Post vital signs: Reviewed and stable  Last Vitals:  Vitals Value Taken Time  BP 133/72 10/21/20 1151  Temp    Pulse 115 10/21/20 1153  Resp 15 10/21/20 1153  SpO2 98 % 10/21/20 1153  Vitals shown include unvalidated device data.  Last Pain:  Vitals:   10/21/20 0607  TempSrc:   PainSc: 0-No pain         Complications: No notable events documented.

## 2020-10-21 NOTE — Consult Note (Signed)
   ENT Progress Note: Procedure(s): Endoscopic Endonasal CSF leak repair PLACEMENT OF LUMBAR DRAIN TRANSPHENOIDAL APPROACH EXPOSURE ENDOSCOPIC TRANS NASAL APPROACH WITH FUSION   Subjective: Recurrent spinal fluid leak  Objective: Vital signs in last 24 hours: Temp:  [98 F (36.7 C)] 98 F (36.7 C) (08/30 0548) Pulse Rate:  [77] 77 (08/30 0548) Resp:  [19] 19 (08/30 0548) BP: (159)/(79) 159/79 (08/30 0548) SpO2:  [100 %] 100 % (08/30 0548) Weight:  [113.4 kg] 113.4 kg (08/30 0548) Weight change:     Intake/Output from previous day: No intake/output data recorded. Intake/Output this shift: No intake/output data recorded.  Labs: No results for input(s): WBC, HGB, HCT, PLT in the last 72 hours. No results for input(s): NA, K, CL, CO2, GLUCOSE, BUN, CALCIUM in the last 72 hours.  Invalid input(s): CREATININR  Studies/Results: No results found.   PHYSICAL EXAM: Patent anterior nasal passageway without evidence of infection.   Assessment/Plan: Patient with history of recurrent CSF rhinorrhea and meningitis.  CT findings show dehiscence in the right ethmoid region.  Plan endoscopic repair of CSF leak.    Gail Edwards 10/21/2020, 7:30 AM

## 2020-10-21 NOTE — Discharge Instructions (Signed)
Sinus/Nasal Instructions:  1. Limited activity 2. Liquid and soft diet 3. May bathe and shower 4. Saline nasal spray - 4 puffs/nostril every hour while awake, begin the morning after surgery 5. Elevate Head of Bed 6. No nose blowing/Open mouth sneeze 7. Alternate Tylenol and ibuprofen every 6 hours as needed for pain.  Call [facility] for any questions or concerns regarding nasal issues: [phone]  

## 2020-10-21 NOTE — Anesthesia Procedure Notes (Signed)
Procedure Name: Intubation Date/Time: 10/21/2020 7:41 AM Performed by: Moshe Salisbury, CRNA Pre-anesthesia Checklist: Patient identified, Emergency Drugs available, Suction available and Patient being monitored Patient Re-evaluated:Patient Re-evaluated prior to induction Oxygen Delivery Method: Circle System Utilized Preoxygenation: Pre-oxygenation with 100% oxygen Induction Type: IV induction Ventilation: Mask ventilation without difficulty Laryngoscope Size: Mac and 3 Grade View: Grade I Tube type: Oral Tube size: 7.5 mm Number of attempts: 1 Airway Equipment and Method: Stylet Placement Confirmation: ETT inserted through vocal cords under direct vision, positive ETCO2 and breath sounds checked- equal and bilateral Secured at: 21 cm Tube secured with: Tape Dental Injury: Teeth and Oropharynx as per pre-operative assessment

## 2020-10-21 NOTE — Op Note (Signed)
Operative Note:  ENDOSCOPIC CSF LEAK REPAIR      Patient: Gail Edwards  Medical record number: [id number]  Date:10/21/2020  Pre-operative Indications: 1.  CSF Leak     2.  Deviated nasal septum       Postoperative Indications: Same  Surgical Procedure: 1. Endoscopic Repair of CSF Leak    2.  Right endoscopic sinus surgery with intraoperative navigation (Xomed Fusion) consisting of: Right total ethmoidectomy and right nasal frontal recess exploration.    3.  Nasal septoplasty      Anesthesia: GET  Surgeon: Barbee Cough, M.D.  Neurosurgeon: Dr. Maurice Small  Complications: None  EBL: 100 cc  Findings: Severe nasal septal deviation with right-sided nasal airway obstruction.  No evidence of active sinonasal disease or infection.  Anterior right bony dehiscence with CSF leakage from the right anterior fovea ethmoidalis.  Reconstruction consisting of DuraGen, Adheris dural glue, Surgicel and free mucosal graft from the right middle turbinate.  Nasal pore packing placed.  Note: The neurosurgical component of the operative procedure is dictated as a separate operative note by Dr. Maurice Small.   Brief History: The patient is a 65 y.o. 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 The patient has a history of CSF rhinorrhea consistent with spinal fluid leak.  The patient developed acute meningitis early in 2022 and her leakage resolved for a period of time, she developed recurrent right sided rhinorrhea.  CT and MRI scan findings were consistent with bony dehiscence in the right superior ethmoid region along the fovea ethmoidalis. Patient seen by me at [facility] preoperatively with review of nasal anatomy and sinus CT scan for navigation.  Given the patient's history and findings, the above surgical procedures were recommended, risks and benefits were discussed in detail with the patient.  They understand and agree with our plan for surgery which is scheduled at [facility] under  general anesthesia.  Surgical Procedure: The patient is brought to the neurosurgical operating room on 10/21/2020 and placed in supine position on the operating table. General endotracheal anesthesia was established without difficulty. When the patient was adequately anesthetized, surgical timeout was performed with correct identification of the patient and the surgical procedure. The patient's nose was then injected with 9 cc of 1% lidocaine 1:100,000 dilution epinephrine which was injected in a submucosal fashion. The patient's nose was then packed with Afrin-soaked cottonoid pledgets were left in place for approximately 10 minutes to allow for vasoconstriction and hemostasis.  The Xomed Fusion navigation headgear was applied and anatomic and surgical landmarks were identified and confirmed, navigation was used throughout the sinus component of the surgical procedure.  0 degree nasal endoscopy was then performed bilaterally.  The patient had a severe right superior septal deviation which limited access to the right middle meatus.  A nasal septoplasty was then undertaken.  Left anterior hemitransfixion incision was created using a #15 scalpel.  A mucoperichondrial flap was elevated on the left-hand side from anterior to posterior.  The anterior cartilaginous septum was crossed and a mucoperichondrial flap was elevated on the patient's right allowing direct access to the entire bony cartilaginous septum.  Anterior septal cartilage was resected there is later morselized return to the mucoperichondrial pocket.  Posterior bony deviation was then resected with through-cutting forceps creating a midline septum and preserving the overlying mucosal flaps.  Endoscopic sinus surgery for location of the CFS leak was then undertaken this consisted of total ethmoidectomy and nasal frontal recess exploration using 0 degree endoscope and a  straight microdebrider.  The middle turbinate was carefully medialized and using  endoscopic scissors was resected leaving a small attachment point for anatomic visualization.  The posterior attachment of the middle turbinate was then cauterized with suction cautery and there was no significant active bleeding.  The superior aspect of the ethmoid dissection was carried from posterior to anterior with a 45 degree telescope and a curved microdebrider.  Dissection was then extended into the nasal frontal recess which was widely enlarged to allow adequate drainage and accommodate for possible scar tissue after reconstruction.  Using blunt and sharp dissection under direct visualization with navigation the area of dehiscence was identified.  The surrounding sinus mucosa was resected exposing the bony dehiscence and dura.  With adequate access to the dehiscence the neurosurgical component of the procedure was begun by Dr. Maurice Small.  This is dictated as a separate operative report.  This was reconstructed in multiple layers by Dr. Maurice Small with a DuraGen underlay graft.  Adherence dural glue was then applied over the entire area and Valsalva maneuver was performed to check for CSF leak.  No evidence of active leakage or bleeding.  Surgicel was then placed.  A free mucosal graft harvested from the middle turbinate resection was then applied overlying the entire area.  Adheris dural glue was then placed over the entire flap margin   There was no evidence of active CSF leak or bleeding.  Nasa-pore packing was placed in the superior ethmoid cavity to allow extra support.  Nasal septal reconstruction was then undertaken.  The resected anterior septal cartilage was morselized and return to the mucoperichondrial pocket.  Mucosal of flaps were reapproximated with interrupted 4-0 gut suture. Mattressing suture was undertaken with 4-0 gut suture in order to reapproximate the flaps from anterior to posterior.  Bilateral Doyle nasal septal splints were then placed after the application of Bactroban ointment  and sutured in position with a 3-0 Ethilon suture.  The patient's nasal cavity was irrigated and suctioned.  Surgical sponge count was correct. An oral gastric tube was passed and the stomach contents were aspirated. Patient was awakened from anesthetic and transferred from the operating room to the recovery room in stable condition. There were no complications and blood loss was 100 cc.   Barbee Cough, M.D. [facility] 10/21/2020

## 2020-10-21 NOTE — H&P (Signed)
Surgical H&P Update  HPI: 65 y.o. woman with history of CSF leak discovered after meningitis earlier this year. Drainage stopped after the infection and recurred, now here for treatment. No changes in health since she was last seen. Still having rhinorrhea and wishes to proceed with surgery.  PMHx:  Past Medical History:  Diagnosis Date   Abnormal Pap smear 2009   [id number]   Diabetes mellitus without complication (HCC)    Femur fracture, left (HCC) 2021   no surgery required   HIV infection (HCC)    Hypertension    Insomnia    Meningitis    Osteopenia    Pre-diabetes    Psoriasis    Vitamin D deficiency    FamHx:  Family History  Problem Relation Age of Onset   Diabetes Mother    Cancer Father        brain   Dementia Father    Breast cancer Neg Hx    SocHx:  reports that she has never smoked. She has never used smokeless tobacco. She reports that she does not drink alcohol and does not use drugs.  Physical Exam: AOx3, PERRL, FS, TM  Strength 5/5 x4, SILTx4  Assesment/Plan: 65 y.o. woman with CSF rhinorrhea, here for LD placement and endonasal repair. Risks, benefits, and alternatives discussed and the patient would like to continue with surgery.  -OR today -4N ICU post-op  Jadene Pierini, MD 10/21/20 7:33 AM

## 2020-10-21 NOTE — Plan of Care (Signed)
Resp e/u. Able to tolerate HOB <30 degrees. Adequate PO intake.

## 2020-10-21 NOTE — Anesthesia Postprocedure Evaluation (Signed)
Anesthesia Post Note  Patient: Gail Edwards  Procedure(s) Performed: Endoscopic Endonasal CSF leak repair (Nose) PLACEMENT OF LUMBAR DRAIN (Back) TRANSPHENOIDAL APPROACH EXPOSURE (Nose) ENDOSCOPIC TRANS NASAL APPROACH WITH FUSION (Nose)     Patient location during evaluation: PACU Anesthesia Type: General Level of consciousness: awake and alert Pain management: pain level controlled Vital Signs Assessment: post-procedure vital signs reviewed and stable Respiratory status: spontaneous breathing, nonlabored ventilation and respiratory function stable Cardiovascular status: blood pressure returned to baseline, stable and tachycardic Postop Assessment: no apparent nausea or vomiting Anesthetic complications: no   No notable events documented.  Last Vitals:  Vitals:   10/21/20 1406 10/21/20 1415  BP: (!) 161/85   Pulse: (!) 107 (!) 107  Resp: 20 (!) 27  Temp:    SpO2: 98% 96%    Last Pain:  Vitals:   10/21/20 1406  TempSrc:   PainSc: 0-No pain                 Beryle Lathe

## 2020-10-22 ENCOUNTER — Encounter (HOSPITAL_COMMUNITY): Payer: Self-pay | Admitting: Neurological Surgery

## 2020-10-22 LAB — SURGICAL PATHOLOGY

## 2020-10-22 MED ORDER — CHLORHEXIDINE GLUCONATE CLOTH 2 % EX PADS
6.0000 | MEDICATED_PAD | Freq: Every day | CUTANEOUS | Status: DC
  Administered 2020-10-22 – 2020-10-23 (×2): 6 via TOPICAL

## 2020-10-22 NOTE — Progress Notes (Signed)
Dr. Maurice Small gave order for PT eval tomorrow and to keep [facility] > 15 Degrees.

## 2020-10-22 NOTE — Progress Notes (Signed)
Dr. Maurice Small gave diet order and order for neuro checks q4H via secure chat.

## 2020-10-22 NOTE — Progress Notes (Signed)
   ENT Progress Note: POD #1 s/p Procedure(s): Endoscopic Endonasal CSF leak repair PLACEMENT OF LUMBAR DRAIN TRANSPHENOIDAL APPROACH EXPOSURE ENDOSCOPIC TRANS NASAL APPROACH WITH FUSION   Subjective: C/O congestion  Objective: Vital signs in last 24 hours: Temp:  [97.2 F (36.2 C)-98.9 F (37.2 C)] 98.3 F (36.8 C) (08/31 0400) Pulse Rate:  [86-114] 93 (08/31 0800) Resp:  [12-27] 24 (08/31 0800) BP: (85-161)/(49-95) 127/95 (08/31 0800) SpO2:  [89 %-98 %] 96 % (08/31 0800) Weight change:  Last BM Date:  (pta)  Intake/Output from previous day: 08/30 0701 - 08/31 0700 In: 1068.8 [I.V.:868.8; IV Piggyback:200] Out: 3756 [Urine:3550; Drains:156; Blood:50] Intake/Output this shift: Total I/O In: 10 [I.V.:10] Out: 10 [Drains:10]  Labs: Recent Labs    10/21/20 1733  WBC 11.9*  HGB 13.6  HCT 41.7  PLT 331   No results for input(s): NA, K, CL, CO2, GLUCOSE, BUN, CALCIUM in the last 72 hours.  Invalid input(s): CREATININR  Studies/Results: DG Lumbar Spine 1 View  Result Date: 10/21/2020 CLINICAL DATA:  Surgery, elective Z41.9 (ICD-10-CM) EXAM: LUMBAR SPINE - 1 VIEW COMPARISON:  08/15/2020. FINDINGS: Fluoro time: 16 seconds. A single C-arm image was obtained intraoperatively and submitted for post operative interpretation. This image demonstrates a needle with the tip projecting at the right paramidline L3-L4 level. Please see the performing provider's procedural report for further detail. IMPRESSION: Intraoperative radiograph, as detailed above. Electronically Signed   By: Feliberto Harts M.D.   On: 10/21/2020 12:57   DG C-Arm 1-60 Min-No Report  Result Date: 10/21/2020 Fluoroscopy was utilized by the requesting physician.  No radiographic interpretation.     PHYSICAL EXAM: No bleeding or drainage   Assessment/Plan: Pt stable Begin saline nasal spray this am, cont nasal precautions OP po abx prescribed Plan lumbar drain for ~3 days per Dr. Maurice Small Plan d/c  after drain removal     Osborn Coho 10/22/2020, 8:22 AM

## 2020-10-22 NOTE — Progress Notes (Addendum)
Neurosurgery Service Progress Note  Subjective: No acute events overnight, no rhinorrhea, mild headache yesterday that resolved w/ APAP   Objective: Vitals:   10/22/20 0600 10/22/20 0606 10/22/20 0700 10/22/20 0800  BP: (!) 85/49 132/86 132/77 (!) 127/95  Pulse: 93  86 93  Resp: (!) 23  20 (!) 24  Temp:    97.8 F (36.6 C)  TempSrc:    Oral  SpO2: 98%  95% 96%  Weight:      Height:        Physical Exam: AOx3, PERRL, EOMI, FS, TM, Strength 5/5 x4, SILTx4 Drain in place w/ expected fluorescein tinged CSF  Assessment & Plan: 65 y.o. woman s/p LD placement and endonasal endoscopic CSF leak repair, recovering well.  -continue LD x3d at 10cc/hr, will clamp 9/2 x24h then d/c if no leak -ICU for LD -HOB > 15 degrees -SCDs/TEDs, SQH 9/1  Jadene Pierini  10/22/20 8:56 AM

## 2020-10-23 NOTE — Evaluation (Signed)
Physical Therapy Evaluation & Discharge Patient Details Name: Gail Edwards MRN: [id number] DOB: Sep 22, 1955 Today's Date: 10/23/2020   History of Present Illness  65 y/o female with history of CSF leak discovered after meningitis earlier in 2022. Drainage stopped after infection and recurred. Continues to have rhinorrhea. Patient now s/p endoscopic repair of CSF leak, R endoscopic sinus surgery with R total ethmoidectomy and R nasal frontal receess exploration, and nasal septoplasty with placement of lumbar drain. PMH: DM type 2, HIV infection, meningitis, insomnia, psoriasis, vitamin D deficiency.  Clinical Impression  PTA, patient lives alone and reports independence with mobility. Discussed with RN prior to evaluation, RN clamped lumbar drain prior to mobility. Patient currently functioning at modI level for mobility with no AD. Patient at baseline for mobility. Patient motivated to get better and return home soon. No further skilled PT needs required acutely. No PT follow up recommended at this time.     Follow Up Recommendations No PT follow up    Equipment Recommendations  None recommended by PT    Recommendations for Other Services       Precautions / Restrictions Precautions Precautions: None Precaution Comments: lumbar drain Restrictions Weight Bearing Restrictions: No      Mobility  Bed Mobility Overal bed mobility: Modified Independent                  Transfers Overall transfer level: Modified independent                  Ambulation/Gait Ambulation/Gait assistance: Modified independent (Device/Increase time) Gait Distance (Feet): 300 Feet Assistive device: None Gait Pattern/deviations: WFL(Within Functional Limits)   Gait velocity interpretation: >4.37 ft/sec, indicative of normal walking speed    Stairs            Wheelchair Mobility    Modified Rankin (Stroke Patients Only)       Balance Overall balance assessment: No apparent  balance deficits (not formally assessed)                                           Pertinent Vitals/Pain Pain Assessment: 0-10 Pain Score: 1  Pain Location: nose Pain Descriptors / Indicators: Sore Pain Intervention(s): Monitored during session    Home Living Family/patient expects to be discharged to:: Private residence Living Arrangements: Alone Available Help at Discharge: Family;Available PRN/intermittently Type of Home: House Home Access: Stairs to enter Entrance Stairs-Rails: None Entrance Stairs-Number of Steps: 2 Home Layout: Two level;Able to live on main level with bedroom/bathroom (has fully accessible handicapped bedroom and bathroom) Home Equipment: Gail Edwards - 2 wheels;Adaptive equipment;Shower seat Additional Comments: son lives in [place] ( Gail Edwards goes by [name])    Prior Function Level of Independence: Independent         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     Higher education careers adviser        Extremity/Trunk Assessment   Upper Extremity Assessment Upper Extremity Assessment: Overall WFL for tasks assessed    Lower Extremity Assessment Lower Extremity Assessment: Overall WFL for tasks assessed    Cervical / Trunk Assessment Cervical / Trunk Assessment: Kyphotic  Communication   Communication: No difficulties  Cognition Arousal/Alertness: Awake/alert Behavior During Therapy: WFL for tasks assessed/performed Overall Cognitive Status: Within Functional Limits for tasks assessed  General Comments      Exercises     Assessment/Plan    PT Assessment Patent does not need any further PT services  PT Problem List         PT Treatment Interventions      PT Goals (Current goals can be found in the Care Plan section)  Acute Rehab PT Goals Patient Stated Goal: to get better and go home PT Goal Formulation: All assessment and education complete, DC therapy     Frequency     Barriers to discharge        Co-evaluation               AM-PAC PT "6 Clicks" Mobility  Outcome Measure Help needed turning from your back to your side while in a flat bed without using bedrails?: None Help needed moving from lying on your back to sitting on the side of a flat bed without using bedrails?: None Help needed moving to and from a bed to a chair (including a wheelchair)?: None Help needed standing up from a chair using your arms (e.g., wheelchair or bedside chair)?: None Help needed to walk in hospital room?: None Help needed climbing 3-5 steps with a railing? : None 6 Click Score: 24    End of Session Equipment Utilized During Treatment: Gait belt Activity Tolerance: Patient tolerated treatment well Patient left: in bed;with call bell/phone within reach Nurse Communication: Mobility status;Other (comment) (notified end of session to unclamp drain) PT Visit Diagnosis: Muscle weakness (generalized) (M62.81)    Time: 2536-6440 PT Time Calculation (min) (ACUTE ONLY): 26 min   Charges:   PT Evaluation $PT Eval Low Complexity: 1 Low PT Treatments $Therapeutic Activity: 8-22 mins        Aragon Scarantino A. Dan Humphreys PT, DPT Acute Rehabilitation Services Pager [phone] Office [phone]   Viviann Spare 10/23/2020, 10:01 AM

## 2020-10-23 NOTE — TOC Initial Note (Signed)
Transition of Care [facility]) - Initial/Assessment Note    Patient Details  Name: Gail Edwards MRN: [id number] Date of Birth: May 04, 1955  Transition [facility]) CM/SW Contact:    Lockie Pares, RN Phone Number: 10/23/2020, 3:32 PM  Clinical Narrative:                  65 year old admitted with CSF leak post meningitis.  She had a leak initially, it stopped then re-occurred. She elected to come in for lumbar drain. PT evaluation revealed  no need for follow up. It is day 2 currently should be DC potentially Friday or Saturday. . No needs identified at this time.   Expected Discharge Plan: Home/Self Care Barriers to Discharge: Continued Medical Work up   Patient Goals and CMS Choice        Expected Discharge Plan and Services Expected Discharge Plan: Home/Self Care       Living arrangements for the past 2 months: Single Family Home                                      Prior Living Arrangements/Services Living arrangements for the past 2 months: Single Family Home   Patient language and need for interpreter reviewed:: Yes        Need for Family Participation in Patient Care: Yes (Comment) Care giver support system in place?: Yes (comment)   Criminal Activity/Legal Involvement Pertinent to Current Situation/Hospitalization: No - Comment as needed  Activities of Daily Living Home Assistive Devices/Equipment: Contact lenses, Eyeglasses, Blood pressure cuff ADL Screening (condition at time of admission) Patient's cognitive ability adequate to safely complete daily activities?: Yes Is the patient deaf or have difficulty hearing?: No Does the patient have difficulty seeing, even when wearing glasses/contacts?: No Does the patient have difficulty concentrating, remembering, or making decisions?: No Patient able to express need for assistance with ADLs?: Yes Does the patient have difficulty dressing or bathing?: No Independently performs ADLs?: Yes (appropriate for  developmental age) Does the patient have difficulty walking or climbing stairs?: No Weakness of Legs: None Weakness of Arms/Hands: None  Permission Sought/Granted                  Emotional Assessment       Orientation: : Oriented to Self, Oriented to Place, Oriented to  Time, Oriented to Situation Alcohol / Substance Use: Not Applicable Psych Involvement: No (comment)  Admission diagnosis:  CSF leak [G96.00] Patient Active Problem List   Diagnosis Date Noted   Vaginal sore 06/25/2020   CSF leak 05/13/2020   Encounter for intubation    Encounter for orogastric (OG) tube placement    Meningitis 05/04/2020   Alopecia 01/04/2018   Bruising 01/04/2018   Hyperlipidemia 12/06/2012   Elevated blood sugar 05/29/2012   Insomnia 05/29/2012   HTN (hypertension) 11/25/2011   PAP SMER CERV W/LW GRADE SQUAMOUS INTRAEPITH LES 06/20/2007   PSORIASIS NEC 11/28/2006   HIV disease (HCC) 11/24/2005   PCP:  Lorenda Ishihara, MD Pharmacy:   [facility]  [facility]) - [place], [place] - [address] [place] [address] [place] [address] Phone: [phone] Fax: [phone]  [id number] [place], [place] - [address] AT [facility] [address] & [facility] [address] [place] [address] Phone: [phone] Fax: [phone]     Social Determinants of Health (  SDOH) Interventions    Readmission Risk Interventions No flowsheet data found.

## 2020-10-23 NOTE — Progress Notes (Signed)
Neurosurgery Service Progress Note  Subjective: No acute events overnight, no rhinorrhea, no HA  Objective: Vitals:   10/23/20 0700 10/23/20 0732 10/23/20 0800 10/23/20 0900  BP: (!) 128/95  (!) 129/92 125/83  Pulse: 92  82 87  Resp: 15  (!) 22 (!) 21  Temp:  98.1 F (36.7 C)    TempSrc:  Oral    SpO2: 95%  94% 95%  Weight:      Height:        Physical Exam: AOx3, PERRL, EOMI, FS, TM, Strength 5/5 x4, SILTx4 Drain in place w/ clear CSF  Assessment & Plan: 65 y.o. woman s/p LD placement and endonasal endoscopic CSF leak repair, recovering well.  -continue LD x3d at 10cc/hr, will clamp 9/2 x24h then d/c if no leak -ICU for LD -HOB > 15 degrees -SCDs/TEDs, SQH 9/1  Jadene Pierini  10/23/20 9:11 AM

## 2020-10-24 NOTE — Progress Notes (Signed)
Lumbar drain clamped per order at 1430.

## 2020-10-24 NOTE — Progress Notes (Signed)
Neurosurgery Service Progress Note  Subjective: No acute events overnight, no rhinorrhea, no HA  Objective: Vitals:   10/24/20 0800 10/24/20 1100 10/24/20 1200 10/24/20 1300  BP:  131/81    Pulse:  92 92 89  Resp:  (!) 21 18 (!) 26  Temp: 98.1 F (36.7 C)  97.6 F (36.4 C)   TempSrc: Oral  Oral   SpO2:  (!) 89% (!) 89% 95%  Weight:      Height:        Physical Exam: AOx3, PERRL, EOMI, FS, TM, Strength 5/5 x4, SILTx4 Drain in place w/ clear CSF  Assessment & Plan: 65 y.o. woman s/p LD placement and endonasal endoscopic CSF leak repair, recovering well.  -continue LD x3d at 10cc/hr, clamp today x24h, if no rhinorrhea then d/c lumbar drain tomorrow (9/3) and can go home after drain is removed -ICU for LD -HOB > 15 degrees -SCDs/TEDs  Jadene Pierini  10/24/20 2:02 PM

## 2020-10-25 MED ORDER — HYDROCODONE-ACETAMINOPHEN 5-325 MG PO TABS: 1.0000 | ORAL_TABLET | ORAL | 0 refills | Status: AC | PRN

## 2020-10-25 MED ORDER — DOCUSATE SODIUM 100 MG PO CAPS: 100.0000 mg | ORAL_CAPSULE | Freq: Two times a day (BID) | ORAL | 0 refills | Status: AC

## 2020-10-25 MED ORDER — CEPHALEXIN 500 MG PO CAPS: 500.0000 mg | ORAL_CAPSULE | Freq: Three times a day (TID) | ORAL | 0 refills | Status: AC

## 2020-10-25 NOTE — Progress Notes (Signed)
   ENT Progress Note: POD #4 s/p Procedure(s): Endoscopic Endonasal CSF leak repair PLACEMENT OF LUMBAR DRAIN TRANSPHENOIDAL APPROACH EXPOSURE ENDOSCOPIC TRANS NASAL APPROACH WITH FUSION   Subjective: Moderate nasal congestion, no discharge or bleeding.  Objective: Vital signs in last 24 hours: Temp:  [97.3 F (36.3 C)-97.8 F (36.6 C)] 97.4 F (36.3 C) (09/03 0800) Pulse Rate:  [70-101] 81 (09/03 0800) Resp:  [14-30] 14 (09/03 0800) BP: (111-144)/(72-88) 131/84 (09/03 0800) SpO2:  [89 %-98 %] 94 % (09/03 0800) Weight change:  Last BM Date: 10/20/20  Intake/Output from previous day: 09/02 0701 - 09/03 0700 In: 240 [P.O.:240] Out: 396 [Urine:350; Drains:46] Intake/Output this shift: Total I/O In: 240 [P.O.:240] Out: -   Labs: No results for input(s): WBC, HGB, HCT, PLT in the last 72 hours. No results for input(s): NA, K, CL, CO2, GLUCOSE, BUN, CALCIUM in the last 72 hours.  Invalid input(s): CREATININR  Studies/Results: No results found.   PHYSICAL EXAM: Minimal crusting, no bleeding or abnormal discharge.   Assessment/Plan: Patient stable after endoscopic repair of CSF leak with Dr. Maurice Small on 10/21/2020.  No evidence of leakage or infection.  Plan discharge per neurosurgical service.  Follow-up in my office in approximately 10 days for splint removal.  Patient will continue oral antibiotics as prescribed.  We reviewed her postoperative precautions.    Osborn Coho 10/25/2020, 9:24 AM

## 2020-10-25 NOTE — Discharge Summary (Signed)
Physician Discharge Summary  Patient ID: Gail Edwards MRN: [id number] DOB/AGE: January 07, 1956 65 y.o.  Admit date: 10/21/2020 Discharge date: 10/25/2020  Admission Diagnoses: Spontaneous intracranial CSF leak  Discharge Diagnoses: The same Active Problems:   CSF leak   Discharged Condition: good  Hospital Course: Dr. Johnsie Cancel and Dr. 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  The patient was monitored in the ICU postoperatively.  Her drain was clamped on 10/24/2020.  She had no evidence of CSF leak on 10/25/2020 and requested discharge to home.  Her lumbar drain was removed.  She was given verbal and written discharge instructions.  All her questions were answered.  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 rate 14, height 5\' 5"  (1.651 m), weight 113.4 kg, SpO2 94 %. The patient is alert and pleasant.  She looks well.  There is no sign of CSF rhinorrhea.  Her strength is normal.  Her speech is normal.  Disposition: Home  Discharge Instructions     Call MD for:  difficulty breathing, headache or visual disturbances   Complete by: As directed    Call MD for:  extreme fatigue   Complete by: As directed    Call MD for:  hives   Complete by: As directed    Call MD for:  persistant dizziness or light-headedness   Complete by: As directed    Call MD for:  persistant nausea and vomiting   Complete by: As directed    Call MD for:  redness, tenderness, or signs of infection (pain, swelling, redness, odor or green/yellow discharge around incision site)   Complete by: As directed    Call MD for:  severe uncontrolled pain   Complete by: As directed    Call MD for:  temperature  >100.4   Complete by: As directed    Diet - low sodium heart healthy   Complete by: As directed    Discharge instructions   Complete by: As directed    Call [phone] for a followup appointment. Take a stool softener while you are using pain medications.   Driving Restrictions   Complete by: As directed    Do not drive for 2 weeks.   Increase activity slowly   Complete by: As directed    Lifting restrictions   Complete by: As directed    Do not lift more than 5 pounds. No excessive bending or twisting.   May shower / Bathe   Complete by: As directed    Remove the dressing for 3 days after surgery.  You may shower, but leave the incision alone.   Remove dressing in 24 hours   Complete by: As directed       Allergies as of 10/25/2020       Reactions   Codeine Other (See Comments)   Headache        Medication List     STOP taking these medications    valACYclovir 500 MG tablet Commonly known as: Valtrex       TAKE these medications    acetaminophen 325 MG tablet Commonly known as: TYLENOL Take 1 tablet (325 mg total) by mouth every 4 (four) hours as needed for mild pain, fever or headache (or temp > 37.5 C (99.5  F)).   calcium carbonate 750 MG chewable tablet Commonly known as: TUMS EX Chew 1,500 mg by mouth daily.   cephALEXin 500 MG capsule Commonly known as: Keflex Take 1 capsule (500 mg total) by mouth 3 (three) times daily for 4 days.   clobetasol 0.05 % external solution Commonly known as: TEMOVATE Apply 1 application topically 2 (two) times daily as needed (irritation). What changed: Another medication with the same name was changed. Make sure you understand how and when to take each.   clobetasol ointment 0.05 % Commonly known as: TEMOVATE APPLY 1 APPLICATION  TOPICALLY 2  TIMES DAILY What changed:  how much to take how to take this when to take this reasons to take this additional instructions   docusate sodium 100 MG capsule Commonly  known as: COLACE Take 1 capsule (100 mg total) by mouth 2 (two) times daily.   HYDROcodone-acetaminophen 5-325 MG tablet Commonly known as: NORCO/VICODIN Take 1 tablet by mouth every 4 (four) hours as needed for moderate pain.   losartan 100 MG tablet Commonly known as: COZAAR Take 100 mg by mouth daily.   multivitamin with minerals Tabs tablet Take 1 tablet by mouth daily.   Odefsey 200-25-25 MG Tabs tablet Generic drug: emtricitabine-rilpivir-tenofovir AF Take 1 tablet by mouth daily with breakfast.   temazepam 15 MG capsule Commonly known as: RESTORIL TAKE 1 CAPSULE BY MOUTH  EVERY NIGHT AT BEDTIME AS  NEEDED FOR SLEEP What changed:  how much to take how to take this when to take this reasons to take this   Vitamin D 50 MCG (2000 UT) tablet Take 2,000 Units by mouth daily.        Follow-up Information     Osborn Coho, MD Follow up in 2 week(s).   Specialty: Otolaryngology Contact information: [address] [place] [address] [phone]                 Signed: Cristi Loron 10/25/2020, 9:20 AM

## 2020-11-26 ENCOUNTER — Telehealth: Payer: Self-pay

## 2020-11-26 NOTE — Telephone Encounter (Signed)
Patient called to request lab orders be mailed to her home. Confirmed address with patient. Will route to provider.   Sandie Ano, RN

## 2020-11-27 NOTE — Telephone Encounter (Signed)
Signed lab orders placed in outgoing mail to be sent to patient's address.   Sandie Ano, RN

## 2020-12-08 ENCOUNTER — Other Ambulatory Visit: Payer: Self-pay

## 2020-12-08 ENCOUNTER — Ambulatory Visit
Admission: RE | Admit: 2020-12-08 | Discharge: 2020-12-08 | Disposition: A | Discharge status: Home / Self Care | Payer: 59 | Source: Ambulatory Visit | Attending: Internal Medicine | Admitting: Internal Medicine

## 2020-12-08 ENCOUNTER — Encounter: Payer: Self-pay | Admitting: Infectious Diseases

## 2020-12-08 DIAGNOSIS — Z1231 Encounter for screening mammogram for malignant neoplasm of breast: Secondary | ICD-10-CM

## 2020-12-08 IMAGING — MG MM DIGITAL SCREENING BILAT W/ TOMO AND CAD
6 of 12 series · 6 of 36 positions shown · non-contrast
Comparison: Previous exam(s).

CLINICAL DATA: Screening.

EXAM:
DIGITAL SCREENING BILATERAL MAMMOGRAM WITH TOMOSYNTHESIS AND CAD
TECHNIQUE: Bilateral screening digital craniocaudal and mediolateral oblique
mammograms were obtained. Bilateral screening digital breast
tomosynthesis was performed. The images were evaluated with
computer-aided detection.

[L CC synth-2D (1 of 2)]
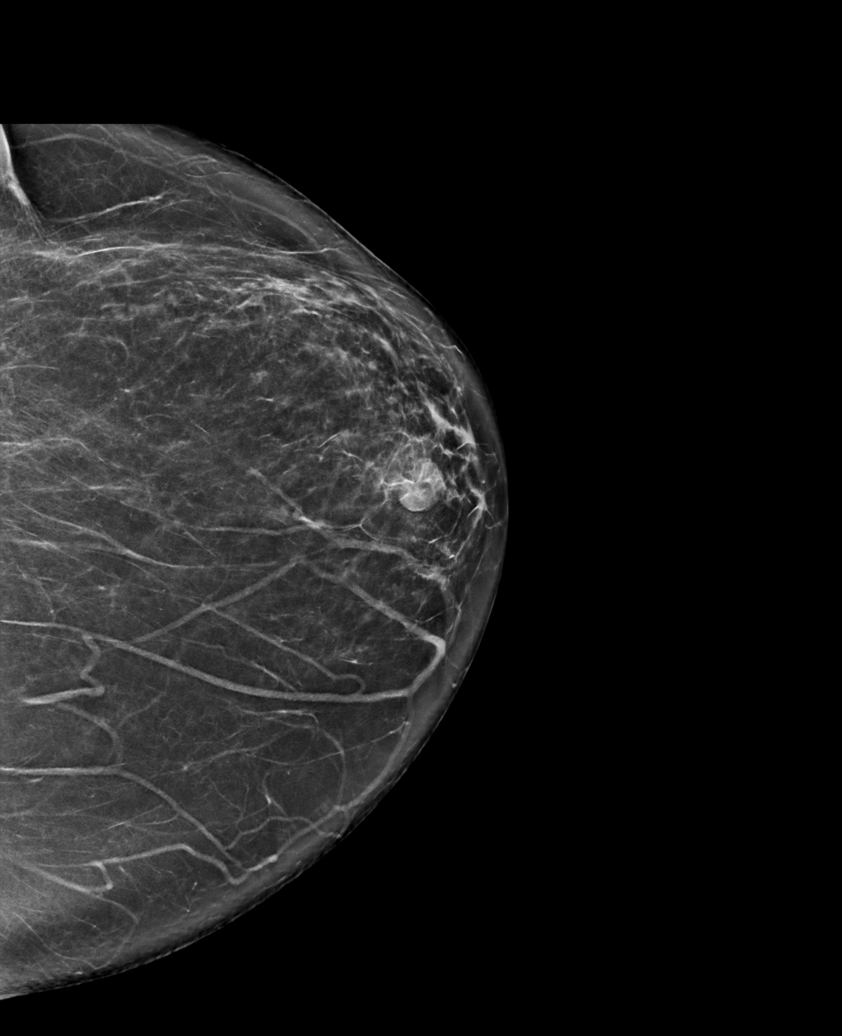

[L CC synth-2D (2 of 2)]
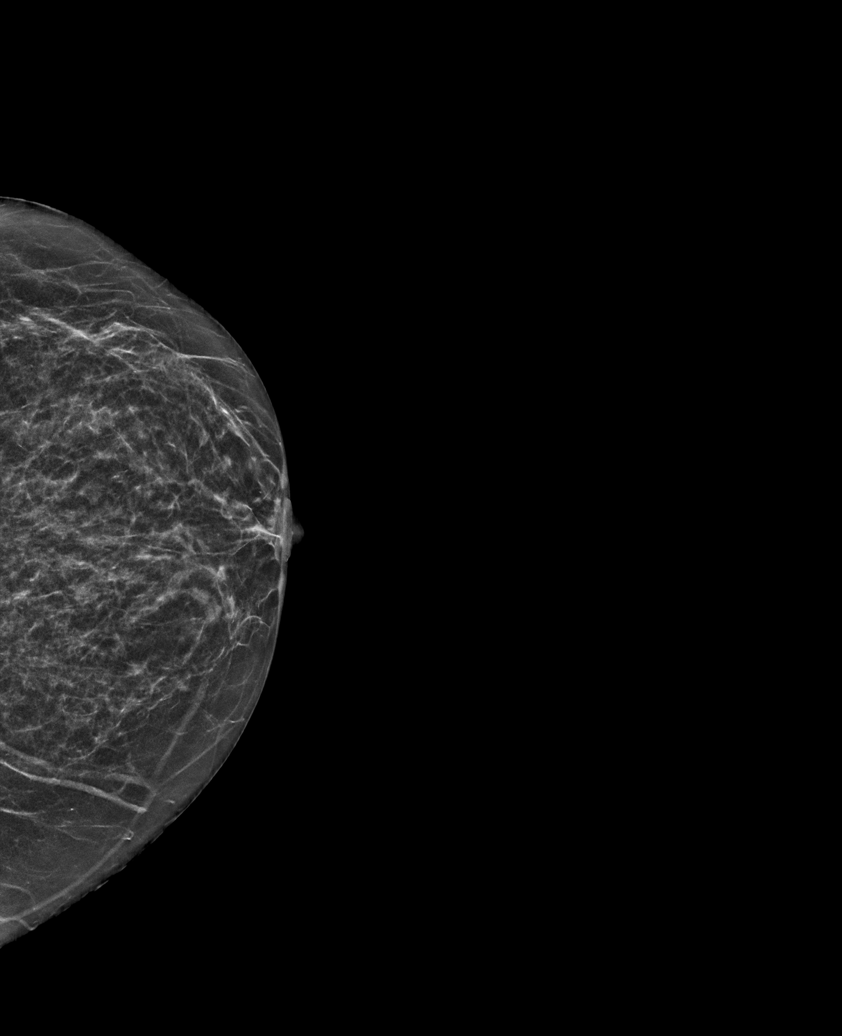

[R MLO synth-2D]
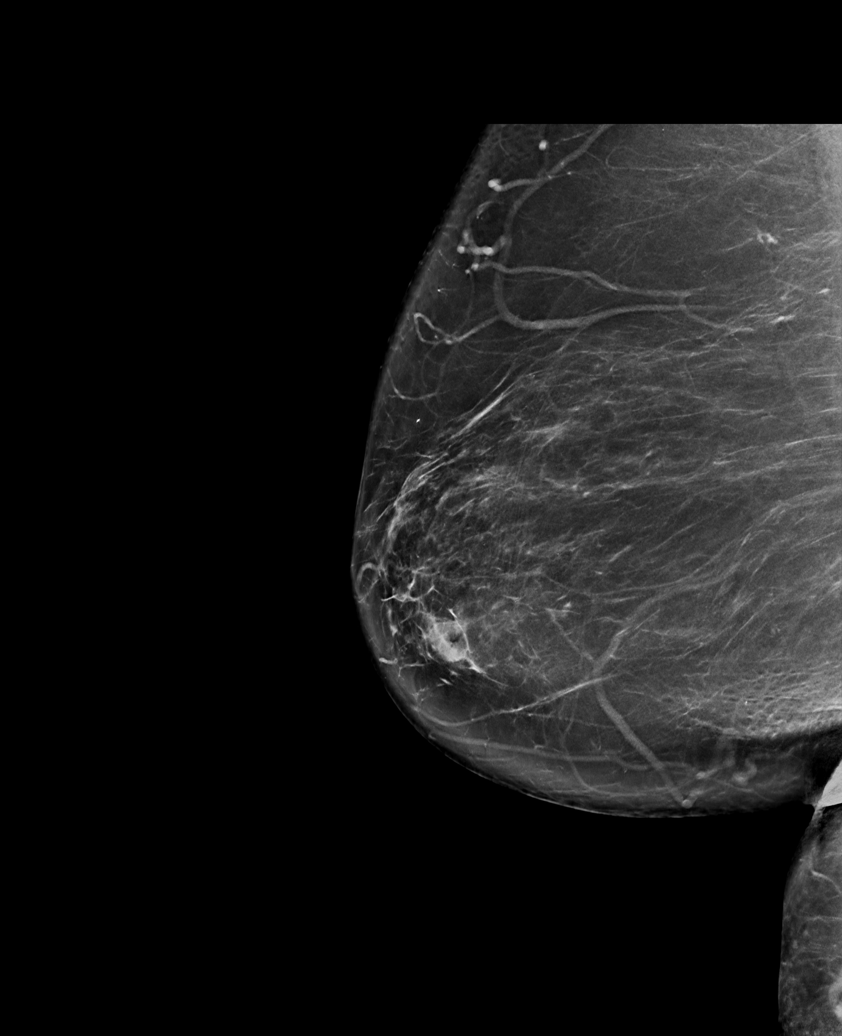

[R CC synth-2D (1 of 2)]
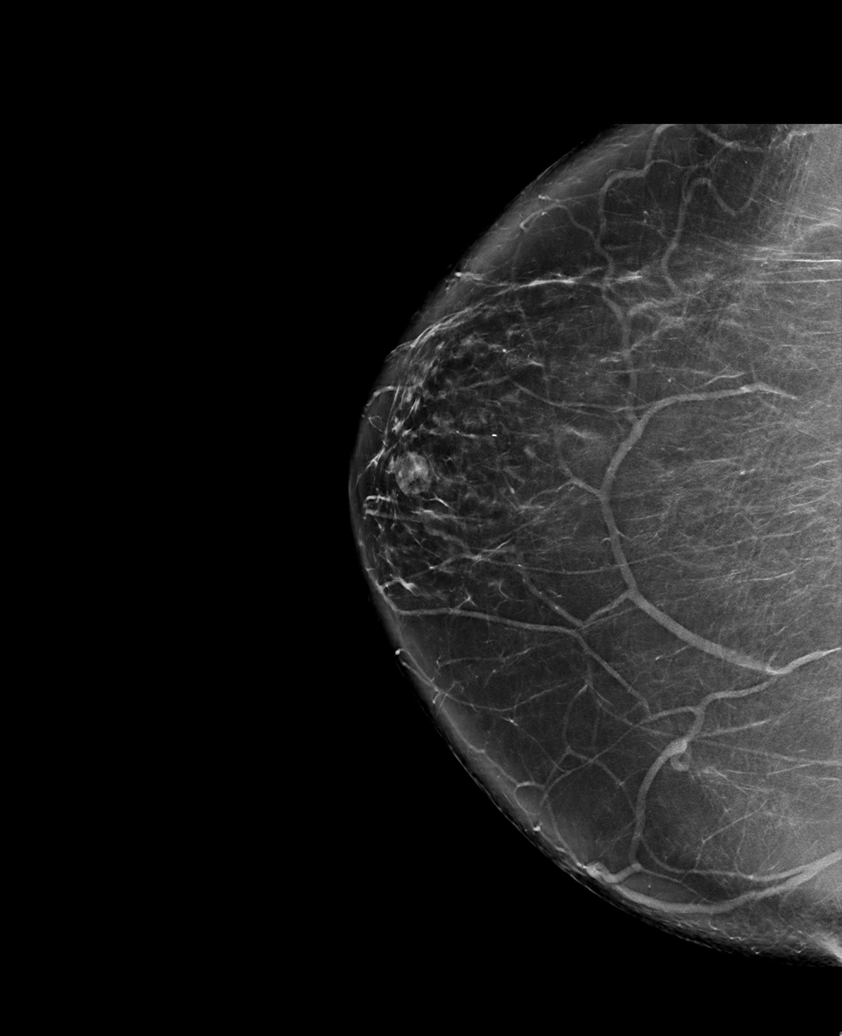

[L MLO synth-2D]
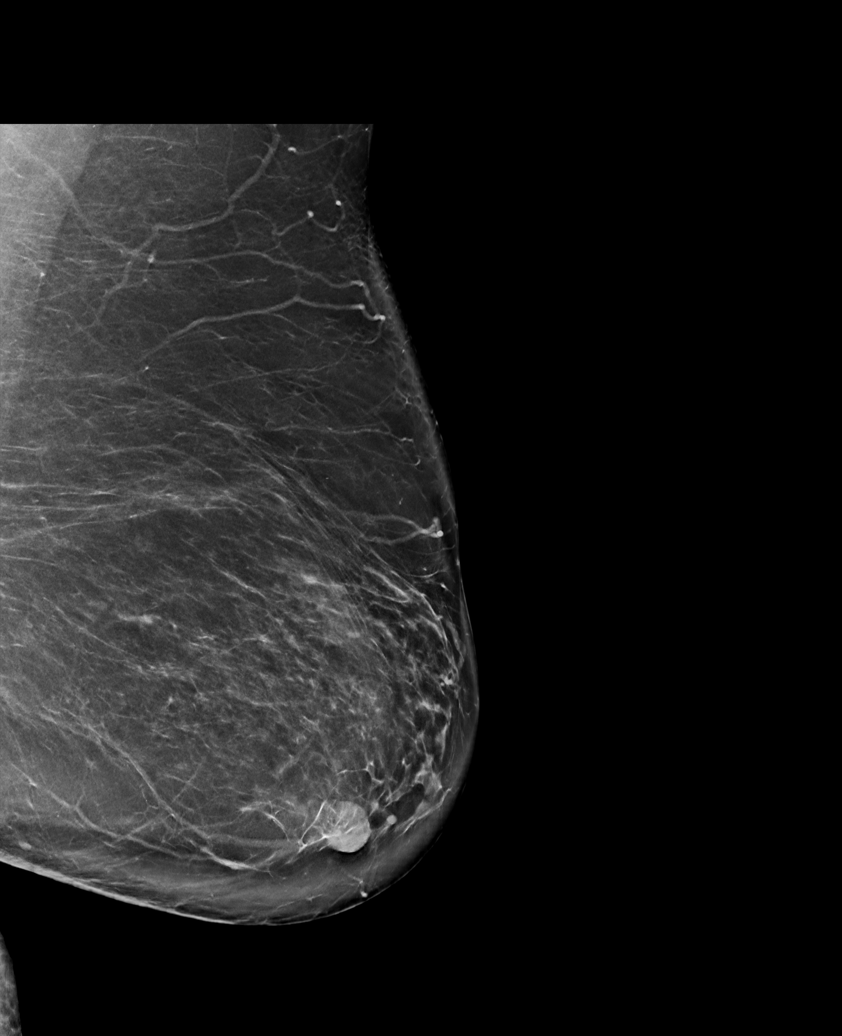

[R CC synth-2D (2 of 2)]
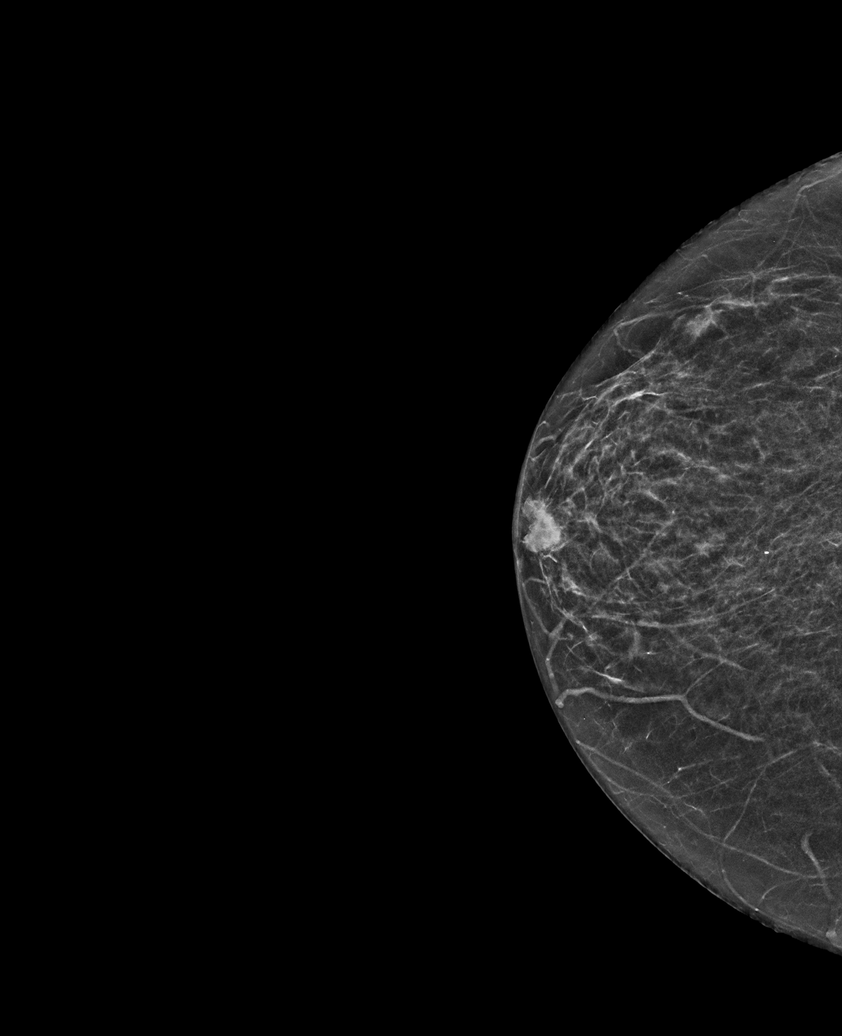

[6 of 36 positions shown; findings below may reference images not displayed]

ACR Breast Density Category b: There are scattered areas of
fibroglandular density.
FINDINGS: There are no findings suspicious for malignancy.
IMPRESSION: No mammographic evidence of malignancy. A result letter of this
screening mammogram will be mailed directly to the patient.

RECOMMENDATION:
Screening mammogram in one year. (Code:[BY])

BI-RADS CATEGORY  1: Negative.

## 2021-01-13 ENCOUNTER — Telehealth: Payer: 59 | Admitting: Infectious Diseases

## 2021-01-20 ENCOUNTER — Telehealth (INDEPENDENT_AMBULATORY_CARE_PROVIDER_SITE_OTHER): Payer: 59 | Admitting: Infectious Diseases

## 2021-01-20 ENCOUNTER — Encounter: Payer: Self-pay | Admitting: Infectious Diseases

## 2021-01-20 ENCOUNTER — Other Ambulatory Visit: Payer: Self-pay

## 2021-01-20 DIAGNOSIS — B20 Human immunodeficiency virus [HIV] disease: Secondary | ICD-10-CM | POA: Diagnosis not present

## 2021-01-20 DIAGNOSIS — G9600 Cerebrospinal fluid leak, unspecified: Secondary | ICD-10-CM

## 2021-01-20 DIAGNOSIS — G4709 Other insomnia: Secondary | ICD-10-CM

## 2021-01-20 MED ORDER — TEMAZEPAM 15 MG PO CAPS: ORAL_CAPSULE | ORAL | 4 refills | Status: DC

## 2021-01-20 MED ORDER — ODEFSEY 200-25-25 MG PO TABS: 1.0000 | ORAL_TABLET | Freq: Every day | ORAL | 5 refills | Status: DC

## 2021-01-20 NOTE — Assessment & Plan Note (Signed)
She is doing well She will get her next COVID booster from her local pharmacy Would contiue her current art She has had mammo Will defer to her PCP for her next pap.  I let her know that she will be getting a new provider at her next visit.  She has her labs done through her employer rtc 9 months.

## 2021-01-20 NOTE — Assessment & Plan Note (Signed)
We discussed her insomina rx. Will refill.

## 2021-01-20 NOTE — Assessment & Plan Note (Signed)
Appears to be resolved.  Greatly appreciate  ENT, neurosurgery.

## 2021-01-20 NOTE — Progress Notes (Signed)
   Subjective:    Patient ID: Gail Edwards, female  DOB: 09-Jan-1956, 65 y.o.        MRN: [id number]   HPI 65 yo F with HIV+ since May 1991.  Has IM PCP at [facility].   On odefsy.  She has had a busy summer after being in hospital in March with a CSF leak, meningitis.  She has had multiple ENT, neurosurgery evals since then.  She had endoscopic surgical repair 10-21-20 and since has been doing well. She has no further leakage. No f/c.   Her ex-husband has moved to [facility] and they are now divorced.   No problems with her ART.  Had mammo this year, normal.  She will get bivalent COVID vax at pharm.  She had labs done 11-2020 CD4 494 HIV RNA <20 Glc 140, A1C 6.5%. She was started on metformin. Repeat A1C planned for 03-2021.   HIV 1 RNA Quant (copies/mL)  Date Value  05/02/2020 <20  11/09/2011 <20  02/25/2011 <20   CD4 T Cell Abs  Date Value  05/14/2020 517 /uL  05/02/2020 121 /uL (L)  11/09/2011 760 cmm     Health Maintenance  Topic Date Due   PAP SMEAR-Modifier  12/05/2019   COVID-19 Vaccine (4 - Booster for Pfizer series) 02/29/2020   INFLUENZA VACCINE  09/22/2020   MAMMOGRAM  12/09/2022   Pneumococcal Vaccine 52-65 Years old (4 - PPSV23 if available, else PCV20) 06/25/2025   TETANUS/TDAP  10/09/2025   COLONOSCOPY (Pts 45-37yrs Insurance coverage will need to be confirmed)  02/23/2028   Hepatitis C Screening  Completed   HIV Screening  Completed   Zoster Vaccines- Shingrix  Completed   HPV VACCINES  Aged Out      Review of Systems  Constitutional:  Negative for chills, fever and weight loss.  HENT:  Negative for congestion.   Eyes:  Negative for blurred vision.  Neurological:  Negative for tingling and headaches.   Please see HPI. All other systems reviewed and negative.     Objective:  Physical Exam   1. Due to the national emergency this service was provided using telemedicine.  Video.  2. Consent from the patient for the telehealth visit and that you  identified patient (name, DOB)  3. Your locations, Pt and Provider home, [facility].  4. Chief complaint for visit ID f/u  5. Document anyone else on the call none  6. If the visit was a phone call, that you include the time you spent on the call: 13 minutes.         Assessment & Plan:

## 2021-01-20 NOTE — Addendum Note (Signed)
Addended by: Chrishelle Zito C on: 01/20/2021 03:15 PM   Modules accepted: Orders

## 2021-04-09 ENCOUNTER — Encounter: Payer: Self-pay | Admitting: Infectious Diseases

## 2021-08-12 ENCOUNTER — Encounter: Payer: Self-pay | Admitting: Infectious Diseases

## 2021-10-05 ENCOUNTER — Other Ambulatory Visit: Payer: Self-pay | Admitting: Infectious Diseases

## 2021-10-05 DIAGNOSIS — G4709 Other insomnia: Secondary | ICD-10-CM

## 2021-10-27 ENCOUNTER — Other Ambulatory Visit: Payer: Self-pay | Admitting: Internal Medicine

## 2021-10-27 DIAGNOSIS — Z1231 Encounter for screening mammogram for malignant neoplasm of breast: Secondary | ICD-10-CM

## 2021-12-15 ENCOUNTER — Encounter: Payer: Self-pay | Admitting: Infectious Diseases

## 2021-12-15 ENCOUNTER — Ambulatory Visit
Admission: RE | Admit: 2021-12-15 | Discharge: 2021-12-15 | Disposition: A | Discharge status: Home / Self Care | Payer: 59 | Source: Ambulatory Visit | Attending: Internal Medicine | Admitting: Internal Medicine

## 2021-12-15 ENCOUNTER — Ambulatory Visit: Payer: 59

## 2021-12-15 DIAGNOSIS — Z1231 Encounter for screening mammogram for malignant neoplasm of breast: Secondary | ICD-10-CM

## 2022-01-05 ENCOUNTER — Encounter: Payer: Self-pay | Admitting: Infectious Diseases

## 2022-01-05 ENCOUNTER — Other Ambulatory Visit: Payer: Self-pay

## 2022-01-05 ENCOUNTER — Ambulatory Visit: Payer: 59 | Admitting: Infectious Diseases

## 2022-01-05 VITALS — BP 125/60 | HR 92 | Temp 98.1°F

## 2022-01-05 DIAGNOSIS — B20 Human immunodeficiency virus [HIV] disease: Secondary | ICD-10-CM

## 2022-01-05 DIAGNOSIS — E7849 Other hyperlipidemia: Secondary | ICD-10-CM

## 2022-01-05 DIAGNOSIS — I10 Essential (primary) hypertension: Secondary | ICD-10-CM

## 2022-01-05 DIAGNOSIS — E118 Type 2 diabetes mellitus with unspecified complications: Secondary | ICD-10-CM

## 2022-01-05 DIAGNOSIS — Z7984 Long term (current) use of oral hypoglycemic drugs: Secondary | ICD-10-CM

## 2022-01-05 DIAGNOSIS — G4709 Other insomnia: Secondary | ICD-10-CM

## 2022-01-05 MED ORDER — METFORMIN HCL ER 500 MG PO TB24: 500.0000 mg | ORAL_TABLET | Freq: Every day | ORAL | 3 refills | Status: AC

## 2022-01-05 MED ORDER — TEMAZEPAM 15 MG PO CAPS: ORAL_CAPSULE | ORAL | 1 refills | Status: DC

## 2022-01-05 MED ORDER — SEMAGLUTIDE(0.25 OR 0.5MG/DOS) 2 MG/3ML ~~LOC~~ SOPN: 0.2500 mg | PEN_INJECTOR | SUBCUTANEOUS | 1 refills | Status: DC

## 2022-01-05 MED ORDER — ROSUVASTATIN CALCIUM 5 MG PO TABS: 5.0000 mg | ORAL_TABLET | Freq: Every day | ORAL | 3 refills | Status: DC

## 2022-01-05 MED ORDER — LOSARTAN POTASSIUM 100 MG PO TABS: 100.0000 mg | ORAL_TABLET | Freq: Every day | ORAL | 3 refills | Status: DC

## 2022-01-05 MED ORDER — ODEFSEY 200-25-25 MG PO TABS: 1.0000 | ORAL_TABLET | Freq: Every day | ORAL | 5 refills | Status: DC

## 2022-01-05 MED ORDER — ULTRA FLO INSULIN PEN NEEDLES 32G X 4 MM MISC: 1.0000 [IU] | 3 refills | Status: AC

## 2022-01-05 NOTE — Assessment & Plan Note (Addendum)
Will work on getting her on ozempic 0.25 mg sq weekly to start.  D/I donna Stop metformin when she starts ozempic Rtc in 1 week.

## 2022-01-05 NOTE — Progress Notes (Signed)
Subjective:    Patient ID: Gail Edwards, female  DOB: [birth year], 66 y.o.        MRN: [id number]   HPI 66 yo F with HIV+ since May 1991.  Has IM PCP at [facility].   On odefsy.   She has had a busy summer 2022 after being in hospital in March with a CSF leak, meningitis.  She has had multiple ENT, neurosurgery evals since then.  She had endoscopic surgical repair 10-21-20 and since has been doing well. She has no further leakage. No f/c.   HIV RNA < 40 CD4 799 (12-25-21) (Labs done at work)  She has had big life change in that her son and his family have moved into her house.  She is planning to retire 2025. She has worries about getting her medications when she retires.   Taking metformin.  Does not check FSG.  Last A1C 6.8%.  Mammo was (-) this year. Pap (-) last year.  Had ophtho in Jan 2023- no diabetic retinopathy  HIV 1 RNA Quant (copies/mL)  Date Value  05/02/2020 <20  11/09/2011 <20  02/25/2011 <20   CD4 T Cell Abs  Date Value  05/14/2020 517 /uL  05/02/2020 121 /uL (L)  11/09/2011 760 cmm     Health Maintenance  Topic Date Due   COVID-19 Vaccine (5 - Pfizer risk series) 04/22/2021   INFLUENZA VACCINE  09/22/2021   PAP SMEAR-Modifier  04/03/2022   MAMMOGRAM  12/16/2023   Pneumonia Vaccine 88+ Years old (3 - PPSV23 or PCV20) 06/25/2025   TETANUS/TDAP  10/09/2025   COLONOSCOPY (Pts 45-3yrs Insurance coverage will need to be confirmed)  02/23/2028   DEXA SCAN  Completed   Hepatitis C Screening  Completed   HIV Screening  Completed   Zoster Vaccines- Shingrix  Completed   HPV VACCINES  Aged Out   Got flu vax at pharmacy in September.  Has not gotten covid vax yet.  Has not gotten RSV yet.   Review of Systems  Constitutional:  Negative for chills, fever and weight loss.  Respiratory:  Negative for cough and shortness of breath.   Cardiovascular:  Negative for chest pain.  Gastrointestinal:  Negative for constipation and diarrhea.  Genitourinary:   Negative for dysuria.  Neurological:  Negative for headaches.    Please see HPI. All other systems reviewed and negative.     Objective:  Physical Exam Vitals reviewed.  Constitutional:      Appearance: Normal appearance. She is obese.  HENT:     Mouth/Throat:     Mouth: Mucous membranes are moist.     Pharynx: No oropharyngeal exudate.  Eyes:     Extraocular Movements: Extraocular movements intact.     Conjunctiva/sclera: Conjunctivae normal.  Cardiovascular:     Rate and Rhythm: Normal rate and regular rhythm.  Pulmonary:     Effort: Pulmonary effort is normal.     Breath sounds: Normal breath sounds.  Abdominal:     General: Bowel sounds are normal. There is no distension.     Palpations: Abdomen is soft.     Tenderness: There is no abdominal tenderness.  Musculoskeletal:     Cervical back: Normal range of motion and neck supple.     Right lower leg: No edema.     Left lower leg: No edema.  Neurological:     General: No focal deficit present.     Mental Status: She is alert.  Psychiatric:  Mood and Affect: Mood normal.            Assessment & Plan:

## 2022-01-05 NOTE — Assessment & Plan Note (Signed)
Her BP is well controlled Will continue her ARB Appreciate her PCP

## 2022-01-05 NOTE — Assessment & Plan Note (Signed)
Restoril refilled Her PMP is reviewed. No other prescribers.

## 2022-01-05 NOTE — Assessment & Plan Note (Signed)
She is doing well Will speak with pharm about her vax (COVID and RSV) Her odefsy is working well. No change for now Will see her back in 1 month to see if we can get her on ozempic

## 2022-01-05 NOTE — Assessment & Plan Note (Signed)
Continue statin  Here lfts and lipids were normal.

## 2022-01-05 NOTE — Addendum Note (Signed)
Addended by: Mckennon Zwart C on: 01/05/2022 01:58 PM   Modules accepted: Orders

## 2022-02-02 ENCOUNTER — Other Ambulatory Visit: Payer: Self-pay

## 2022-02-02 ENCOUNTER — Ambulatory Visit: Payer: 59 | Admitting: Infectious Diseases

## 2022-02-02 ENCOUNTER — Encounter: Payer: Self-pay | Admitting: Infectious Diseases

## 2022-02-02 ENCOUNTER — Encounter: Payer: 59 | Admitting: Infectious Diseases

## 2022-02-02 VITALS — BP 116/64 | HR 80 | Temp 97.5°F | Ht 66.0 in | Wt 258.1 lb

## 2022-02-02 DIAGNOSIS — B20 Human immunodeficiency virus [HIV] disease: Secondary | ICD-10-CM

## 2022-02-02 DIAGNOSIS — G9600 Cerebrospinal fluid leak, unspecified: Secondary | ICD-10-CM

## 2022-02-02 DIAGNOSIS — R87612 Low grade squamous intraepithelial lesion on cytologic smear of cervix (LGSIL): Secondary | ICD-10-CM

## 2022-02-02 DIAGNOSIS — Z7984 Long term (current) use of oral hypoglycemic drugs: Secondary | ICD-10-CM

## 2022-02-02 DIAGNOSIS — E118 Type 2 diabetes mellitus with unspecified complications: Secondary | ICD-10-CM | POA: Diagnosis not present

## 2022-02-02 MED ORDER — SEMAGLUTIDE(0.25 OR 0.5MG/DOS) 2 MG/3ML ~~LOC~~ SOPN: 0.5000 mg | PEN_INJECTOR | SUBCUTANEOUS | 1 refills | Status: DC

## 2022-02-02 NOTE — Assessment & Plan Note (Signed)
Doing well Appears to be resolved.  Will f/u prn.

## 2022-02-02 NOTE — Assessment & Plan Note (Signed)
She is doing well Labs reviewed with her.  Will contact her 1 month for ozempic dosing.

## 2022-02-02 NOTE — Progress Notes (Signed)
Subjective:    Patient ID: Gail Edwards, female  DOB: 05/12/1955, 66 y.o.        MRN: [id number]   HPI 66 yo F with HIV+ since May 1991.  Has gotten IM PCP at [facility].  Labs done at [facility].  CD4 799, HIV RNA und, Chol 149, HDL 52  (12-2021).    Has had health maintenance updates- Td 10/10/15, flu 11/21/15, PNVx 8/18,  Mammo 11-2021 (normal)  Pap 10/10/15 Colonoscopy pending.  Bone density 11/20/15.   Had normal TSH 2018   Odefsy is going well, no problems. Takes sometimes with food.  Got COVID and RSV vax last month.   She started on ozempic 1 month ago. Has had no problems with this.  Has not checked FSG since last visit.   Believes she had PAP this Jan.   She had repair of leak (intranasal) 09-2020.  No headaches. No salty taste or sensation of fluid in throat.   HIV 1 RNA Quant (copies/mL)  Date Value  05/02/2020 <20  11/09/2011 <20  02/25/2011 <20   CD4 T Cell Abs  Date Value  05/14/2020 517 /uL  05/02/2020 121 /uL (L)  11/09/2011 760 cmm     Health Maintenance  Topic Date Due  . FOOT EXAM  Never done  . OPHTHALMOLOGY EXAM  Never done  . Diabetic kidney evaluation - Urine ACR  Never done  . HEMOGLOBIN A1C  11/01/2020  . Diabetic kidney evaluation - eGFR measurement  10/21/2021  . COVID-19 Vaccine (5 - 2023-24 season) 10/23/2021  . PAP SMEAR-Modifier  04/03/2022  . MAMMOGRAM  12/16/2023  . Pneumonia Vaccine 65+ Years old (3 - PPSV23 or PCV20) 06/25/2025  . DTaP/Tdap/Td (3 - Td or Tdap) 10/09/2025  . COLONOSCOPY (Pts 45-49yrs Insurance coverage will need to be confirmed)  02/23/2028  . INFLUENZA VACCINE  Completed  . DEXA SCAN  Completed  . Hepatitis C Screening  Completed  . HIV Screening  Completed  . Zoster Vaccines- Shingrix  Completed  . HPV VACCINES  Aged Out      Review of Systems  Constitutional:  Negative for chills, fever and weight loss.  Gastrointestinal:  Negative for constipation, diarrhea and nausea.  Genitourinary:  Negative for  dysuria.    Please see HPI. All other systems reviewed and negative.     Objective:  Physical Exam Vitals reviewed.  Constitutional:      Appearance: Normal appearance. She is obese.  HENT:     Mouth/Throat:     Mouth: Mucous membranes are moist.     Pharynx: Oropharynx is clear. No oropharyngeal exudate.  Cardiovascular:     Rate and Rhythm: Normal rate and regular rhythm.  Pulmonary:     Effort: Pulmonary effort is normal.     Breath sounds: Normal breath sounds.  Abdominal:     General: Bowel sounds are normal. There is no distension.     Palpations: Abdomen is soft.  Musculoskeletal:        General: Normal range of motion.     Cervical back: Normal range of motion and neck supple.     Right lower leg: No edema.     Left lower leg: No edema.  Neurological:     General: No focal deficit present.     Mental Status: She is alert.  Psychiatric:        Mood and Affect: Mood normal.          Assessment & Plan:   

## 2022-02-02 NOTE — Assessment & Plan Note (Signed)
She will f/u with her gyn provider.

## 2022-02-02 NOTE — Assessment & Plan Note (Signed)
Will increase her ozempic dose.  She has been tolerating well.  She will contact us in 1 month to increase dose to next level.

## 2022-03-01 ENCOUNTER — Other Ambulatory Visit: Payer: Self-pay | Admitting: Infectious Diseases

## 2022-03-01 ENCOUNTER — Encounter: Payer: Self-pay | Admitting: Infectious Diseases

## 2022-03-01 DIAGNOSIS — E118 Type 2 diabetes mellitus with unspecified complications: Secondary | ICD-10-CM

## 2022-03-01 MED ORDER — SEMAGLUTIDE (1 MG/DOSE) 4 MG/3ML ~~LOC~~ SOPN: 1.0000 mg | PEN_INJECTOR | SUBCUTANEOUS | 4 refills | Status: DC

## 2022-03-10 LAB — HM DIABETES EYE EXAM

## 2022-03-29 ENCOUNTER — Other Ambulatory Visit: Payer: Self-pay | Admitting: Infectious Diseases

## 2022-03-29 ENCOUNTER — Encounter: Payer: Self-pay | Admitting: Infectious Diseases

## 2022-03-29 DIAGNOSIS — G4709 Other insomnia: Secondary | ICD-10-CM

## 2022-03-30 ENCOUNTER — Other Ambulatory Visit: Payer: Self-pay | Admitting: Infectious Diseases

## 2022-03-30 DIAGNOSIS — E118 Type 2 diabetes mellitus with unspecified complications: Secondary | ICD-10-CM

## 2022-03-30 MED ORDER — SEMAGLUTIDE (1 MG/DOSE) 4 MG/3ML ~~LOC~~ SOPN: 2.0000 mg | PEN_INJECTOR | SUBCUTANEOUS | 4 refills | Status: DC

## 2022-04-14 ENCOUNTER — Encounter: Payer: Self-pay | Admitting: Infectious Diseases

## 2022-04-21 ENCOUNTER — Telehealth: Payer: Self-pay

## 2022-04-21 ENCOUNTER — Encounter: Payer: Self-pay | Admitting: Infectious Diseases

## 2022-04-21 NOTE — Telephone Encounter (Signed)
RTC to Pharmacy spoke with Will.  Need new prescription sent in  for 2 mg pens.  Pharmacist said otherwise insurance will not cover.

## 2022-04-21 NOTE — Telephone Encounter (Signed)
Call to [facility] spoke with Pharmacist Jeani Hawking.  Question about the initial dosing of Ozempic.  Should patient titrate up to the requested dose.  No previous prescription have been filled for the patient.  Questions about the Strength and directions.  Can call [facility] for further questions or send in a new prescription. [phone]

## 2022-04-21 NOTE — Telephone Encounter (Signed)
Incoming fax from pharmacy Message to prescriber : Please clarify the directions for ozempic pen '1MG'$  dose which states '2MG'$  weeklt. This strength pen can only dose 1 MG. Thank you.

## 2022-04-22 MED ORDER — OZEMPIC (2 MG/DOSE) 8 MG/3ML ~~LOC~~ SOPN: 2.0000 mg | PEN_INJECTOR | SUBCUTANEOUS | 11 refills | Status: DC

## 2022-04-22 NOTE — Telephone Encounter (Signed)
New Rx sent.

## 2022-04-22 NOTE — Addendum Note (Signed)
Addended by: Gilles Chiquito B on: 04/22/2022 03:41 PM   Modules accepted: Orders

## 2022-04-27 ENCOUNTER — Telehealth: Payer: Self-pay

## 2022-04-27 NOTE — Telephone Encounter (Signed)
Incoming fax from pharmacy  Message to prescriber: Please clarify the directions for ozempic pen '1mg'$  dose which states 2 mg weekly. This strength pen can only dose 1 mg.Thank you.

## 2022-05-12 ENCOUNTER — Encounter: Payer: Self-pay | Admitting: Infectious Diseases

## 2022-05-12 ENCOUNTER — Ambulatory Visit: Payer: 59 | Admitting: Infectious Diseases

## 2022-05-12 VITALS — BP 132/81 | HR 81 | Temp 97.5°F | Ht 66.0 in | Wt 236.9 lb

## 2022-05-12 DIAGNOSIS — B20 Human immunodeficiency virus [HIV] disease: Secondary | ICD-10-CM | POA: Diagnosis not present

## 2022-05-12 DIAGNOSIS — R87612 Low grade squamous intraepithelial lesion on cytologic smear of cervix (LGSIL): Secondary | ICD-10-CM

## 2022-05-12 DIAGNOSIS — E118 Type 2 diabetes mellitus with unspecified complications: Secondary | ICD-10-CM

## 2022-05-12 DIAGNOSIS — G4709 Other insomnia: Secondary | ICD-10-CM | POA: Diagnosis not present

## 2022-05-12 DIAGNOSIS — A609 Anogenital herpesviral infection, unspecified: Secondary | ICD-10-CM | POA: Diagnosis not present

## 2022-05-12 DIAGNOSIS — E7849 Other hyperlipidemia: Secondary | ICD-10-CM

## 2022-05-12 DIAGNOSIS — Z7984 Long term (current) use of oral hypoglycemic drugs: Secondary | ICD-10-CM

## 2022-05-12 DIAGNOSIS — N898 Other specified noninflammatory disorders of vagina: Secondary | ICD-10-CM

## 2022-05-12 DIAGNOSIS — I10 Essential (primary) hypertension: Secondary | ICD-10-CM

## 2022-05-12 DIAGNOSIS — Z7985 Long-term (current) use of injectable non-insulin antidiabetic drugs: Secondary | ICD-10-CM

## 2022-05-12 DIAGNOSIS — Z8619 Personal history of other infectious and parasitic diseases: Secondary | ICD-10-CM

## 2022-05-12 MED ORDER — VALACYCLOVIR HCL 1 G PO TABS: 1000.0000 mg | ORAL_TABLET | Freq: Two times a day (BID) | ORAL | 2 refills | Status: AC

## 2022-05-12 MED ORDER — ODEFSEY 200-25-25 MG PO TABS: 1.0000 | ORAL_TABLET | Freq: Every day | ORAL | 5 refills | Status: DC

## 2022-05-12 MED ORDER — TEMAZEPAM 15 MG PO CAPS: ORAL_CAPSULE | ORAL | 3 refills | Status: DC

## 2022-05-12 NOTE — Assessment & Plan Note (Signed)
She is doing well Had ophtho this year Will renew her ozempic when requested Check A1C today

## 2022-05-12 NOTE — Assessment & Plan Note (Signed)
Her restoril is renewed I am her only prescriber I have reviewed the PDMP during this encounter.

## 2022-05-12 NOTE — Progress Notes (Signed)
Subjective:    Patient ID: Gail Edwards, female  DOB: 10/03/1955, 67 y.o.        MRN: [id number]   HPI 67 yo F with HIV+ since May 1991.  Has gotten IM PCP at [facility].  Labs done at [facility].  (04-29-22)CD4 648, HIV RNA und, Chol 108, HDL 40, LDL 54 .    Has had health maintenance updates- TC 10/10/15, flu 11/21/15, PNVx 8/18,  Mammo 11-2021 (normal)  Pap pending. Prev normal, her prev PCP stopped at 66.  Colonoscopy "a couple of years ago" "don't ever have to have another one"..  Bone density 11/20/15.    Had normal TSH 2018   Odefsy is going well, no problems. Takes sometimes with food.  Got COVID and RSV vax in fall. .   She started on ozempic Nov. Has only had nausea on 1 day. Making sure she has not been constipated. Has lost 22#.   Would like letter of medical necessity for massages.   Has vaginal sore - for last month, hurts when sitting on. No drainage. No dysuria.  Tried 7 days of monostat.  No new partners.  Had prev lesion and took 7 days of valtrex which resolved.    She had repair of leak (intranasal) 09-2020.  No headaches. No salty taste or sensation of fluid in throat.   HIV 1 RNA Quant (copies/mL)  Date Value  05/02/2020 <20  11/09/2011 <20  02/25/2011 <20   CD4 T Cell Abs  Date Value  05/14/2020 517 /uL  05/02/2020 121 /uL (L)  11/09/2011 760 cmm     Health Maintenance  Topic Date Due  . FOOT EXAM  Never done  . OPHTHALMOLOGY EXAM  Never done  . Diabetic kidney evaluation - Urine ACR  Never done  . HEMOGLOBIN A1C  11/01/2020  . Diabetic kidney evaluation - eGFR measurement  10/21/2021  . COVID-19 Vaccine (6 - 2023-24 season) 03/03/2022  . PAP SMEAR-Modifier  04/03/2022  . MAMMOGRAM  12/16/2023  . Pneumonia Vaccine 98+ Years old (3 of 3 - PPSV23 or PCV20) 06/25/2025  . DTaP/Tdap/Td (3 - Td or Tdap) 10/09/2025  . COLONOSCOPY (Pts 45-49yrs Insurance coverage will need to be confirmed)  02/23/2028  . INFLUENZA VACCINE  Completed  . DEXA SCAN   Completed  . Hepatitis C Screening  Completed  . HIV Screening  Completed  . Zoster Vaccines- Shingrix  Completed  . HPV VACCINES  Aged Out      Review of Systems  Constitutional:  Negative for chills and fever.  Gastrointestinal:  Negative for constipation and diarrhea.  Genitourinary:  Negative for dysuria.  Neurological:  Negative for tingling, sensory change and headaches.   Please see HPI. All other systems reviewed and negative.     Objective:  Physical Exam Vitals reviewed.  Constitutional:      Appearance: Normal appearance. She is obese.  HENT:     Mouth/Throat:     Mouth: Mucous membranes are moist.     Pharynx: No oropharyngeal exudate.  Eyes:     Extraocular Movements: Extraocular movements intact.     Pupils: Pupils are equal, round, and reactive to light.  Cardiovascular:     Rate and Rhythm: Regular rhythm.  Pulmonary:     Effort: Pulmonary effort is normal.     Breath sounds: Normal breath sounds.  Abdominal:     General: Bowel sounds are normal. There is no distension.     Palpations: Abdomen is soft.  Tenderness: There is no abdominal tenderness.  Musculoskeletal:     Right lower leg: No edema.     Left lower leg: No edema.  Neurological:     General: No focal deficit present.     Mental Status: She is alert.  Psychiatric:        Mood and Affect: Mood normal.          Assessment & Plan:

## 2022-05-12 NOTE — Assessment & Plan Note (Signed)
Mildly elevated She has PCP, will continue to watch.

## 2022-05-12 NOTE — Assessment & Plan Note (Signed)
Will renew her valtrex If not improved, exam.

## 2022-05-12 NOTE — Assessment & Plan Note (Signed)
Her last pap was normal.  Will stop.

## 2022-05-12 NOTE — Assessment & Plan Note (Signed)
Her lipids are excellent on crestor.  Her LFTs are nl No myositis, myopathy.

## 2022-05-12 NOTE — Assessment & Plan Note (Signed)
She is doing very well Labs discussed Her odefsy is refilled PCV 20 2028 No new partners Rtc in 9 months.  Letter for medical nessicity for massages

## 2022-07-22 ENCOUNTER — Encounter: Payer: Self-pay | Admitting: Infectious Diseases

## 2022-11-04 ENCOUNTER — Other Ambulatory Visit: Payer: Self-pay | Admitting: Infectious Diseases

## 2022-11-04 ENCOUNTER — Encounter: Payer: Self-pay | Admitting: Infectious Diseases

## 2022-11-04 DIAGNOSIS — Z0000 Encounter for general adult medical examination without abnormal findings: Secondary | ICD-10-CM

## 2022-12-21 ENCOUNTER — Ambulatory Visit
Admission: RE | Admit: 2022-12-21 | Discharge: 2022-12-21 | Disposition: A | Discharge status: Home / Self Care | Payer: 59 | Source: Ambulatory Visit | Attending: Infectious Diseases | Admitting: Infectious Diseases

## 2022-12-21 ENCOUNTER — Other Ambulatory Visit: Payer: Self-pay | Admitting: Infectious Diseases

## 2022-12-21 DIAGNOSIS — E118 Type 2 diabetes mellitus with unspecified complications: Secondary | ICD-10-CM

## 2022-12-21 DIAGNOSIS — Z0000 Encounter for general adult medical examination without abnormal findings: Secondary | ICD-10-CM

## 2022-12-28 ENCOUNTER — Ambulatory Visit: Payer: 59

## 2022-12-29 ENCOUNTER — Ambulatory Visit
Admission: RE | Admit: 2022-12-29 | Discharge: 2022-12-29 | Disposition: A | Discharge status: Home / Self Care | Payer: 59 | Source: Ambulatory Visit | Attending: Internal Medicine | Admitting: Internal Medicine

## 2022-12-29 VITALS — BP 157/84 | HR 81 | Temp 97.8°F | Resp 16

## 2022-12-29 DIAGNOSIS — M549 Dorsalgia, unspecified: Secondary | ICD-10-CM

## 2022-12-29 LAB — POCT URINALYSIS DIP (MANUAL ENTRY)
Bilirubin, UA: NEGATIVE
Blood, UA: NEGATIVE
Glucose, UA: NEGATIVE mg/dL
Ketones, POC UA: NEGATIVE mg/dL
Leukocytes, UA: NEGATIVE
Nitrite, UA: NEGATIVE
Protein Ur, POC: NEGATIVE mg/dL
Spec Grav, UA: <=1.005 — AB (ref 1.010–1.025)
Urobilinogen, UA: 0.2 U/dL
pH, UA: 6 (ref 5.0–8.0)

## 2022-12-29 MED ORDER — KETOROLAC TROMETHAMINE 30 MG/ML IJ SOLN
30.0000 mg | Freq: Once | INTRAMUSCULAR | Status: AC
  Administered 2022-12-29: 30 mg via INTRAMUSCULAR

## 2022-12-29 MED ORDER — CYCLOBENZAPRINE HCL 5 MG PO TABS: 5.0000 mg | ORAL_TABLET | Freq: Two times a day (BID) | ORAL | 0 refills | Status: AC | PRN

## 2022-12-29 NOTE — ED Provider Notes (Addendum)
[facility]    CSN: [id number] Arrival date & time: 12/29/22  0803      History   Chief Complaint Chief Complaint  Patient presents with   Back Pain    Pain lower back right flank. For over a week no fever concerned it may be a kidney infection. - Entered by patient    HPI Gail Edwards is a 67 y.o. female presents for back pain.  Patient reports 2 weeks of an intermittent right mid back pain that does not radiate.  Describes it as a sharp stabbing pain primarily occurring with movement.  Denies any numbness/tingling/weakness of her lower extremities, no bowel or bladder incontinence, no saddle paresthesia.  Denies any injury or known inciting event.  No history of back pain or back surgeries.  Does endorse some urinary frequency but denies hematuria, dysuria, urgency, nausea/vomiting, fevers.  She has been taking Tylenol and ibuprofen for symptoms, last dose of ibuprofen was yesterday.  No other concerns at this time.   Back Pain   Past Medical History:  Diagnosis Date   Abnormal Pap smear 2009   [id number]   Diabetes mellitus without complication (HCC)    Femur fracture, left (HCC) 2021   no surgery required   HIV infection (HCC)    Hypertension    Insomnia    Meningitis    Osteopenia    Pre-diabetes    Psoriasis    Vitamin D deficiency     Patient Active Problem List   Diagnosis Date Noted   Diabetes mellitus type 2, controlled, with complications (HCC) 01/05/2022   Vaginal sore 06/25/2020   Encounter for intubation    Encounter for orogastric (OG) tube placement    Meningitis 05/04/2020   Alopecia 01/04/2018   Bruising 01/04/2018   Hyperlipidemia 12/06/2012   Insomnia 05/29/2012   HTN (hypertension) 11/25/2011   PSORIASIS NEC 11/28/2006   HIV disease (HCC) 11/24/2005    Past Surgical History:  Procedure Laterality Date   CESAREAN SECTION  1987   CHOLECYSTECTOMY  1993   CRANIOTOMY N/A 10/21/2020   Procedure: Endoscopic Endonasal CSF leak  repair;  Surgeon: Jadene Pierini, MD;  Location: [facility] OR;  Service: Neurosurgery;  Laterality: N/A;   ENDOSCOPIC TRANS NASAL APPROACH WITH FUSION N/A 10/21/2020   Procedure: ENDOSCOPIC TRANS NASAL APPROACH WITH FUSION;  Surgeon: Osborn Coho, MD;  Location: [facility] OR;  Service: ENT;  Laterality: N/A;   FRACTURE SURGERY Right 12/17/2016   HERNIA REPAIR  1994, 2001   umbilical   ORIF HUMERUS FRACTURE Right 12/17/2016   Procedure: OPEN REDUCTION INTERNAL FIXATION (ORIF) RIGHT PROXIMAL HUMERUS FRACTURE;  Surgeon: Sheral Apley, MD;  Location: [facility];  Service: Orthopedics;  Laterality: Right;   PLACEMENT OF LUMBAR DRAIN N/A 10/21/2020   Procedure: PLACEMENT OF LUMBAR DRAIN;  Surgeon: Jadene Pierini, MD;  Location: [facility] OR;  Service: Neurosurgery;  Laterality: N/A;   TRANSPHENOIDAL APPROACH EXPOSURE N/A 10/21/2020   Procedure: TRANSPHENOIDAL APPROACH EXPOSURE;  Surgeon: Osborn Coho, MD;  Location: [facility] OR;  Service: ENT;  Laterality: N/A;    OB History     Gravida  2   Para  1   Term  1   Preterm      AB  1   Living  1      SAB  1   IAB      Ectopic      Multiple      Live Births  Home Medications    Prior to Admission medications   Medication Sig Start Date End Date Taking? Authorizing Provider  cyclobenzaprine (FLEXERIL) 5 MG tablet Take 1 tablet (5 mg total) by mouth 2 (two) times daily as needed for muscle spasms. 12/29/22  Yes Radford Pax, NP  Semaglutide, 2 MG/DOSE, (OZEMPIC, 2 MG/DOSE,) 8 MG/3ML SOPN Inject 2 mg into the skin once a week. 04/22/22   Inez Catalina, MD  calcium carbonate (TUMS EX) 750 MG chewable tablet Chew 1,500 mg by mouth daily.    [provider]  Cholecalciferol (VITAMIN D) 50 MCG (2000 UT) tablet Take 2,000 Units by mouth daily.    [provider]  clobetasol (TEMOVATE) 0.05 % external solution Apply 1 application topically 2 (two) times daily as needed (irritation).     [provider]  clobetasol ointment (TEMOVATE) 0.05 % APPLY 1 APPLICATION  TOPICALLY 2  TIMES DAILY Patient taking differently: Apply 1 application topically 2 (two) times daily as needed (irritation). 01/29/20   Ginnie Smart, MD  docusate sodium (COLACE) 100 MG capsule Take 1 capsule (100 mg total) by mouth 2 (two) times daily. 10/25/20   Tressie Stalker, MD  emtricitabine-rilpivir-tenofovir AF (ODEFSEY) 200-25-25 MG TABS tablet Take 1 tablet by mouth daily with breakfast. 05/12/22   Ginnie Smart, MD  HYDROcodone-acetaminophen (NORCO/VICODIN) 5-325 MG tablet Take 1 tablet by mouth every 4 (four) hours as needed for moderate pain. 10/25/20   Tressie Stalker, MD  losartan (COZAAR) 100 MG tablet Take 1 tablet (100 mg total) by mouth daily. 01/05/22   Ginnie Smart, MD  metFORMIN (GLUCOPHAGE-XR) 500 MG 24 hr tablet Take 1 tablet (500 mg total) by mouth daily. 01/05/22   Ginnie Smart, MD  Multiple Vitamin (MULTIVITAMIN WITH MINERALS) TABS tablet Take 1 tablet by mouth daily. 05/08/20   Pokhrel, Rebekah [place], MD  rosuvastatin (CRESTOR) 5 MG tablet TAKE 1 TABLET(5 MG) BY MOUTH DAILY 12/21/22   Ginnie Smart, MD  temazepam (RESTORIL) 15 MG capsule TAKE 1 CAPSULE BY MOUTH EVERY NIGHT AT BEDTIME AS NEEDED FOR SLEEP 05/12/22   Ginnie Smart, MD    Family History Family History  Problem Relation Age of Onset   Diabetes Mother    Cancer Father        brain   Dementia Father    Breast cancer Neg Hx     Social History Social History   Tobacco Use   Smoking status: Never   Smokeless tobacco: Never  Vaping Use   Vaping status: Never Used  Substance Use Topics   Alcohol use: Never   Drug use: Never     Allergies   Codeine   Review of Systems Review of Systems  Musculoskeletal:  Positive for back pain.     Physical Exam Triage Vital Signs ED Triage Vitals  Encounter Vitals Group     BP 12/29/22 0835 (!) 157/84     Systolic BP Percentile --       Diastolic BP Percentile --      Pulse Rate 12/29/22 0835 81     Resp 12/29/22 0835 16     Temp 12/29/22 0835 97.8 F (36.6 C)     Temp Source 12/29/22 0835 Oral     SpO2 12/29/22 0835 97 %     Weight --      Height --      Head Circumference --      Peak Flow --      Pain Score  12/29/22 0834 7     Pain Loc --      Pain Education --      Exclude from Growth Chart --    No data found.  Updated Vital Signs BP (!) 157/84 (BP Location: Right Wrist)   Pulse 81   Temp 97.8 F (36.6 C) (Oral)   Resp 16   SpO2 97%   Visual Acuity Right Eye Distance:   Left Eye Distance:   Bilateral Distance:    Right Eye Near:   Left Eye Near:    Bilateral Near:     Physical Exam Vitals and nursing note reviewed.  Constitutional:      General: She is not in acute distress.    Appearance: Normal appearance. She is obese. She is not ill-appearing.  HENT:     Head: Normocephalic and atraumatic.  Eyes:     Pupils: Pupils are equal, round, and reactive to light.  Cardiovascular:     Rate and Rhythm: Normal rate.  Pulmonary:     Effort: Pulmonary effort is normal.  Musculoskeletal:     Thoracic back: Tenderness present. No swelling, edema, deformity, signs of trauma, lacerations, spasms or bony tenderness. Normal range of motion. No scoliosis.     Lumbar back: Normal.       Back:     Comments: Strength 5 out of 5 bilateral lower extremities  Skin:    General: Skin is warm and dry.  Neurological:     General: No focal deficit present.     Mental Status: She is alert and oriented to person, place, and time.  Psychiatric:        Mood and Affect: Mood normal.        Behavior: Behavior normal.      UC Treatments / Results  Labs (all labs ordered are listed, but only abnormal results are displayed) Labs Reviewed  POCT URINALYSIS DIP (MANUAL ENTRY) - Abnormal; Notable for the following components:      Result Value   Color, UA colorless (*)    Spec Grav, UA <=1.005 (*)    All other  components within normal limits    EKG   Radiology No results found.  Procedures Procedures (including critical care time)  Medications Ordered in UC Medications  ketorolac (TORADOL) 30 MG/ML injection 30 mg (has no administration in time range)    Initial Impression / Assessment and Plan / UC Course  I have reviewed the triage vital signs and the nursing notes.  Pertinent labs & imaging results that were available during my care of the patient were reviewed by me and considered in my medical decision making (see chart for details).     Reviewed exam and symptoms with patient. Reviewed outside labs from 04/2022, unable to past into note as is scanned document.  No red flags.  UA unremarkable.  Given exam, symptoms and UA results suspect likely MSK cause of symptoms.  Toradol injection given in clinic and patient was monitored for 10 minutes after injection with no reaction noted and tolerated well.  She was instructed no NSAIDs for 24 hours and verbalized understanding.  Trial of Flexeril, side effect profile reviewed.  Advised heat and rest.  PCP follow-up in 2 to 3 days for recheck.  ER precautions reviewed. Final Clinical Impressions(s) / UC Diagnoses   Final diagnoses:  Mid back pain on right side     Discharge Instructions      You were given a Toradol injection in clinic today.  Do not take any over the counter NSAID's such as Advil, ibuprofen, Aleve, or naproxen for 24 hours. You may take tylenol if needed You may take Flexeril as needed.  Please note this medication will make you drowsy.  Do not drink alcohol or drive while on this medication.  Heat to the back as needed lots of rest.  Please follow-up with your PCP in 2 to 3 days for recheck.  Please go to the ER if you develop any worsening symptoms.  I hope you feel better soon!     ED Prescriptions     Medication Sig Dispense Auth. Provider   cyclobenzaprine (FLEXERIL) 5 MG tablet Take 1 tablet (5 mg total) by  mouth 2 (two) times daily as needed for muscle spasms. 10 tablet Radford Pax, NP      PDMP not reviewed this encounter.   Radford Pax, NP 12/29/22 [id number]    Radford Pax, NP 12/29/22 [phone]

## 2022-12-29 NOTE — ED Triage Notes (Signed)
Pt prsents to UC w/ c/o right lower back x2 weeks. Reports increased urinary frequency and fatigue. Denies direct injury or falling.

## 2022-12-29 NOTE — Discharge Instructions (Addendum)
You were given a Toradol injection in clinic today. Do not take any over the counter NSAID's such as Advil, ibuprofen, Aleve, or naproxen for 24 hours. You may take tylenol if needed You may take Flexeril as needed.  Please note this medication will make you drowsy.  Do not drink alcohol or drive while on this medication.  Heat to the back as needed lots of rest.  Please follow-up with your PCP in 2 to 3 days for recheck.  Please go to the ER if you develop any worsening symptoms.  I hope you feel better soon!

## 2023-01-17 LAB — LAB REPORT - SCANNED
A1c: 6
Chlamydia, Swab/Urine, PCR: NEGATIVE
EGFR: 53

## 2023-02-02 ENCOUNTER — Ambulatory Visit: Payer: 59 | Admitting: Infectious Diseases

## 2023-02-02 ENCOUNTER — Encounter: Payer: Self-pay | Admitting: Infectious Diseases

## 2023-02-02 VITALS — BP 154/97 | HR 89 | Temp 98.0°F | Ht 65.0 in | Wt 247.8 lb

## 2023-02-02 DIAGNOSIS — B20 Human immunodeficiency virus [HIV] disease: Secondary | ICD-10-CM

## 2023-02-02 DIAGNOSIS — E785 Hyperlipidemia, unspecified: Secondary | ICD-10-CM | POA: Diagnosis not present

## 2023-02-02 DIAGNOSIS — Z7985 Long-term (current) use of injectable non-insulin antidiabetic drugs: Secondary | ICD-10-CM

## 2023-02-02 DIAGNOSIS — E7849 Other hyperlipidemia: Secondary | ICD-10-CM

## 2023-02-02 DIAGNOSIS — E118 Type 2 diabetes mellitus with unspecified complications: Secondary | ICD-10-CM

## 2023-02-02 DIAGNOSIS — I10 Essential (primary) hypertension: Secondary | ICD-10-CM | POA: Diagnosis not present

## 2023-02-02 DIAGNOSIS — G4709 Other insomnia: Secondary | ICD-10-CM

## 2023-02-02 MED ORDER — OZEMPIC (2 MG/DOSE) 8 MG/3ML ~~LOC~~ SOPN: 2.0000 mg | PEN_INJECTOR | SUBCUTANEOUS | 11 refills | Status: DC

## 2023-02-02 MED ORDER — ROSUVASTATIN CALCIUM 5 MG PO TABS: 5.0000 mg | ORAL_TABLET | Freq: Every day | ORAL | 3 refills | Status: DC

## 2023-02-02 MED ORDER — LOSARTAN POTASSIUM 100 MG PO TABS: 100.0000 mg | ORAL_TABLET | Freq: Every day | ORAL | 3 refills | Status: DC

## 2023-02-02 MED ORDER — ODEFSEY 200-25-25 MG PO TABS: 1.0000 | ORAL_TABLET | Freq: Every day | ORAL | 5 refills | Status: DC

## 2023-02-02 MED ORDER — TEMAZEPAM 15 MG PO CAPS: ORAL_CAPSULE | ORAL | 3 refills | Status: DC

## 2023-02-02 NOTE — Assessment & Plan Note (Signed)
Very well controlled on statin.  LFTs normal on her labs.

## 2023-02-02 NOTE — Progress Notes (Signed)
Subjective:    Patient ID: Gail Edwards, female  DOB: 09-04-1955, 68 y.o.        MRN: [id number]   HPI 67 yo F with HIV+ since May 1991.  Has gotten IM PCP at [facility].  Labs done at [facility].  (01-11-23) CD4 728, HIV RNA und, Chol 126, HDL 46, LDL 64 .    Has had health maintenance updates- TC 10/10/15, flu this year, PNVx 8/18,  Mammo 11-2021 (normal)  Eye exam pending in Jan, done Jan 2024.  Her prev PCP stopped at 66.  Colonoscopy "a couple of years ago" "don't ever have to have another one"..  Bone density 11/20/15.    Had normal TSH 2018.    Luisa Hart is going well, no problems. Takes with food. Has 1 year of odefsey in storage (expires 2025/26).  Got COVID, flu and RSV vax in fall.   She started on ozempic Nov 2023. Has lost 22#. Regained 11#.  A1C 6.0% (10-04-22)  Has new grandson. Living with her with parents and her.    HIV 1 RNA Quant (copies/mL)  Date Value  05/02/2020 <20  11/09/2011 <20  02/25/2011 <20   CD4 T Cell Abs  Date Value  05/14/2020 517 /uL  05/02/2020 121 /uL (L)  11/09/2011 760 cmm     Health Maintenance  Topic Date Due  . OPHTHALMOLOGY EXAM  Never done  . Diabetic kidney evaluation - Urine ACR  Never done  . Fecal DNA (Cologuard)  Never done  . Cervical Cancer Screening (Pap smear)  12/05/2019  . HEMOGLOBIN A1C  11/01/2020  . INFLUENZA VACCINE  09/23/2022  . COVID-19 Vaccine (7 - 2023-24 season) 01/18/2023  . Diabetic kidney evaluation - eGFR measurement  04/29/2023  . COLON CANCER SCREENING ANNUAL FOBT  07/15/2023  . FOOT EXAM  02/02/2024  . MAMMOGRAM  12/20/2024  . Pneumonia Vaccine 45+ Years old (4 of 4 - PPSV23 or PCV20) 06/25/2025  . DTaP/Tdap/Td (3 - Td or Tdap) 10/09/2025  . DEXA SCAN  Completed  . Hepatitis C Screening  Completed  . Zoster Vaccines- Shingrix  Completed  . HPV VACCINES  Aged Out  . Colonoscopy  Discontinued      Review of Systems  Constitutional:  Negative for weight loss.  Eyes:  Negative for blurred  vision.  Respiratory:  Negative for cough and shortness of breath.   Cardiovascular:  Negative for leg swelling.  Gastrointestinal:  Negative for constipation and diarrhea.  Genitourinary:  Negative for dysuria.  Neurological:  Negative for sensory change.   Please see HPI. All other systems reviewed and negative.     Objective:  Physical Exam Vitals reviewed.  Constitutional:      Appearance: Normal appearance. She is obese.  HENT:     Mouth/Throat:     Mouth: Mucous membranes are moist.     Pharynx: No oropharyngeal exudate.  Eyes:     Extraocular Movements: Extraocular movements intact.     Pupils: Pupils are equal, round, and reactive to light.  Cardiovascular:     Rate and Rhythm: Normal rate and regular rhythm.  Pulmonary:     Effort: Pulmonary effort is normal.     Breath sounds: Normal breath sounds.  Abdominal:     General: Bowel sounds are normal. There is no distension.     Palpations: Abdomen is soft.     Tenderness: There is no abdominal tenderness.  Musculoskeletal:     Cervical back: Normal range of motion and neck supple.  Right lower leg: No edema.     Left lower leg: No edema.  Feet:     Right foot:     Protective Sensation: 2 sites tested.  2 sites sensed.     Skin integrity: Skin integrity normal.     Left foot:     Protective Sensation: 2 sites tested.  2 sites sensed.     Skin integrity: Skin integrity normal.  Neurological:     General: No focal deficit present.     Mental Status: She is alert.  Psychiatric:        Mood and Affect: Mood normal.          Assessment & Plan:

## 2023-02-02 NOTE — Assessment & Plan Note (Signed)
She is doing very well. Her A1C is excellent.  Will continue on ozempic.

## 2023-02-02 NOTE — Assessment & Plan Note (Addendum)
Will refill restoril prn.  PDMP is reviewed.

## 2023-02-02 NOTE — Assessment & Plan Note (Signed)
Mildly elevated today.  Will watch.  Will get into IMTS if difficult to control.

## 2023-02-02 NOTE — Assessment & Plan Note (Signed)
She is doing well Will see what med changes may be needed as her insurance changes.  Will take on her primary care Vax are up to date.  Will see her back in 9 months.

## 2023-02-03 ENCOUNTER — Other Ambulatory Visit: Payer: Self-pay | Admitting: Infectious Diseases

## 2023-02-03 DIAGNOSIS — E118 Type 2 diabetes mellitus with unspecified complications: Secondary | ICD-10-CM

## 2023-02-14 ENCOUNTER — Encounter: Payer: Self-pay | Admitting: Infectious Diseases

## 2023-02-25 ENCOUNTER — Telehealth: Payer: Self-pay

## 2023-02-25 ENCOUNTER — Encounter: Payer: Self-pay | Admitting: Infectious Diseases

## 2023-02-25 NOTE — Telephone Encounter (Signed)
 Prior Authorization for patient (Ozempic (2 MG/DOSE) 8MG /3ML pen-injectors) came through on cover my meds was submitted with last office notes awaiting approval or denial.  [id number]

## 2023-02-28 NOTE — Telephone Encounter (Signed)
 Decision:Approved  Sherrilyn Heater (Key: [id number]) Ozempic  (2 MG/DOSE) 8MG JONELLE pen-injectors Form [employer] Electronic Prior Authorization Form [phone] NCPDP) Created Message from Plan Request Reference Number: [id number]. OZEMPIC  INJ 8MG JONELLE is approved through 02/25/2024. Your patient may now fill this prescription and it will be covered.. Authorization Expiration Date: February 25, 2024.

## 2023-03-04 DIAGNOSIS — E119 Type 2 diabetes mellitus without complications: Secondary | ICD-10-CM | POA: Diagnosis not present

## 2023-03-04 DIAGNOSIS — I10 Essential (primary) hypertension: Secondary | ICD-10-CM | POA: Diagnosis not present

## 2023-03-08 NOTE — Telephone Encounter (Signed)
 Resubmitted Prior Authorization to patients insurance [employer]   Decision:Approved Jacelyn Cuen (Key: [id number]) Ozempic  (2 MG/DOSE) 8MG JONELLE pen-injectors Form [employer] [place] Medicare Electronic Request Form Created Message from Plan Approved. . Authorization Expiration Date: March 07, 2024.

## 2023-03-17 DIAGNOSIS — K080 Exfoliation of teeth due to systemic causes: Secondary | ICD-10-CM | POA: Diagnosis not present

## 2023-05-31 DIAGNOSIS — M9904 Segmental and somatic dysfunction of sacral region: Secondary | ICD-10-CM | POA: Diagnosis not present

## 2023-05-31 DIAGNOSIS — M9903 Segmental and somatic dysfunction of lumbar region: Secondary | ICD-10-CM | POA: Diagnosis not present

## 2023-05-31 DIAGNOSIS — M9905 Segmental and somatic dysfunction of pelvic region: Secondary | ICD-10-CM | POA: Diagnosis not present

## 2023-05-31 DIAGNOSIS — M5136 Other intervertebral disc degeneration, lumbar region with discogenic back pain only: Secondary | ICD-10-CM | POA: Diagnosis not present

## 2023-06-06 DIAGNOSIS — M9905 Segmental and somatic dysfunction of pelvic region: Secondary | ICD-10-CM | POA: Diagnosis not present

## 2023-06-06 DIAGNOSIS — M9904 Segmental and somatic dysfunction of sacral region: Secondary | ICD-10-CM | POA: Diagnosis not present

## 2023-06-06 DIAGNOSIS — M5136 Other intervertebral disc degeneration, lumbar region with discogenic back pain only: Secondary | ICD-10-CM | POA: Diagnosis not present

## 2023-06-06 DIAGNOSIS — M9903 Segmental and somatic dysfunction of lumbar region: Secondary | ICD-10-CM | POA: Diagnosis not present

## 2023-06-09 DIAGNOSIS — M5136 Other intervertebral disc degeneration, lumbar region with discogenic back pain only: Secondary | ICD-10-CM | POA: Diagnosis not present

## 2023-06-09 DIAGNOSIS — M9903 Segmental and somatic dysfunction of lumbar region: Secondary | ICD-10-CM | POA: Diagnosis not present

## 2023-06-09 DIAGNOSIS — M9904 Segmental and somatic dysfunction of sacral region: Secondary | ICD-10-CM | POA: Diagnosis not present

## 2023-06-09 DIAGNOSIS — M9905 Segmental and somatic dysfunction of pelvic region: Secondary | ICD-10-CM | POA: Diagnosis not present

## 2023-07-13 DIAGNOSIS — M9904 Segmental and somatic dysfunction of sacral region: Secondary | ICD-10-CM | POA: Diagnosis not present

## 2023-07-13 DIAGNOSIS — M9903 Segmental and somatic dysfunction of lumbar region: Secondary | ICD-10-CM | POA: Diagnosis not present

## 2023-07-13 DIAGNOSIS — M5136 Other intervertebral disc degeneration, lumbar region with discogenic back pain only: Secondary | ICD-10-CM | POA: Diagnosis not present

## 2023-07-13 DIAGNOSIS — M9905 Segmental and somatic dysfunction of pelvic region: Secondary | ICD-10-CM | POA: Diagnosis not present

## 2023-08-01 DIAGNOSIS — K080 Exfoliation of teeth due to systemic causes: Secondary | ICD-10-CM | POA: Diagnosis not present

## 2023-08-02 DIAGNOSIS — K080 Exfoliation of teeth due to systemic causes: Secondary | ICD-10-CM | POA: Diagnosis not present

## 2023-08-05 ENCOUNTER — Encounter: Payer: Self-pay | Admitting: Infectious Diseases

## 2023-08-23 DIAGNOSIS — K080 Exfoliation of teeth due to systemic causes: Secondary | ICD-10-CM | POA: Diagnosis not present

## 2023-08-30 DIAGNOSIS — K080 Exfoliation of teeth due to systemic causes: Secondary | ICD-10-CM | POA: Diagnosis not present

## 2023-08-31 ENCOUNTER — Telehealth: Payer: Self-pay

## 2023-08-31 DIAGNOSIS — M9903 Segmental and somatic dysfunction of lumbar region: Secondary | ICD-10-CM | POA: Diagnosis not present

## 2023-08-31 DIAGNOSIS — M5136 Other intervertebral disc degeneration, lumbar region with discogenic back pain only: Secondary | ICD-10-CM | POA: Diagnosis not present

## 2023-08-31 DIAGNOSIS — M9905 Segmental and somatic dysfunction of pelvic region: Secondary | ICD-10-CM | POA: Diagnosis not present

## 2023-08-31 DIAGNOSIS — M9904 Segmental and somatic dysfunction of sacral region: Secondary | ICD-10-CM | POA: Diagnosis not present

## 2023-08-31 NOTE — Telephone Encounter (Signed)
 Patient called back to schedule a appointment. Patient is scheduled to see Dr.Hatcher on 10/8 @1 :15.

## 2023-08-31 NOTE — Telephone Encounter (Signed)
 Copied from CRM [id number]. Topic: Appointments - Scheduling Inquiry for Clinic >> Aug 29, 2023  9:37 AM Laurier BROCKS wrote: Reason for CRM: Patient would like a call back at [phone] . To assist in getting scheduled for an appointment with Dr.Hatcher on 11/30/2023 at 1pm if available. Patient also states she blocks unknown callers and if you are unable to reach her leave a VM.

## 2023-09-07 DIAGNOSIS — M5136 Other intervertebral disc degeneration, lumbar region with discogenic back pain only: Secondary | ICD-10-CM | POA: Diagnosis not present

## 2023-09-07 DIAGNOSIS — M9903 Segmental and somatic dysfunction of lumbar region: Secondary | ICD-10-CM | POA: Diagnosis not present

## 2023-09-07 DIAGNOSIS — M9905 Segmental and somatic dysfunction of pelvic region: Secondary | ICD-10-CM | POA: Diagnosis not present

## 2023-09-07 DIAGNOSIS — M9904 Segmental and somatic dysfunction of sacral region: Secondary | ICD-10-CM | POA: Diagnosis not present

## 2023-10-04 ENCOUNTER — Encounter: Payer: Self-pay | Admitting: Infectious Diseases

## 2023-10-10 ENCOUNTER — Encounter: Payer: Self-pay | Admitting: Infectious Diseases

## 2023-10-10 ENCOUNTER — Telehealth: Payer: Self-pay | Admitting: *Deleted

## 2023-10-10 ENCOUNTER — Other Ambulatory Visit: Payer: Self-pay | Admitting: Infectious Diseases

## 2023-10-10 ENCOUNTER — Other Ambulatory Visit: Payer: Self-pay | Admitting: *Deleted

## 2023-10-10 DIAGNOSIS — Z79899 Other long term (current) drug therapy: Secondary | ICD-10-CM

## 2023-10-10 DIAGNOSIS — Z113 Encounter for screening for infections with a predominantly sexual mode of transmission: Secondary | ICD-10-CM

## 2023-10-10 DIAGNOSIS — B20 Human immunodeficiency virus [HIV] disease: Secondary | ICD-10-CM

## 2023-10-10 DIAGNOSIS — E118 Type 2 diabetes mellitus with unspecified complications: Secondary | ICD-10-CM

## 2023-10-10 NOTE — Telephone Encounter (Signed)
 Patient returning call, transferred to clinic

## 2023-10-10 NOTE — Addendum Note (Signed)
 Addended by: Tina Temme C on: 10/10/2023 02:25 PM   Modules accepted: Orders

## 2023-10-10 NOTE — Telephone Encounter (Signed)
 Call to patient to see if new orders needed to be sent for labs.  Message was left that the Clinics had called about sending additional lab orders.

## 2023-10-11 DIAGNOSIS — M9904 Segmental and somatic dysfunction of sacral region: Secondary | ICD-10-CM | POA: Diagnosis not present

## 2023-10-11 DIAGNOSIS — M9903 Segmental and somatic dysfunction of lumbar region: Secondary | ICD-10-CM | POA: Diagnosis not present

## 2023-10-11 DIAGNOSIS — M9905 Segmental and somatic dysfunction of pelvic region: Secondary | ICD-10-CM | POA: Diagnosis not present

## 2023-10-12 NOTE — Telephone Encounter (Signed)
 New orders were place  Pt confirmed email and asked that orders be emailed

## 2023-10-28 ENCOUNTER — Other Ambulatory Visit: Payer: Self-pay | Admitting: Infectious Diseases

## 2023-10-28 DIAGNOSIS — Z1231 Encounter for screening mammogram for malignant neoplasm of breast: Secondary | ICD-10-CM

## 2023-11-10 DIAGNOSIS — Z79899 Other long term (current) drug therapy: Secondary | ICD-10-CM | POA: Diagnosis not present

## 2023-11-10 DIAGNOSIS — E118 Type 2 diabetes mellitus with unspecified complications: Secondary | ICD-10-CM | POA: Diagnosis not present

## 2023-11-11 LAB — COMPREHENSIVE METABOLIC PANEL WITH GFR
ALT: 20 IU/L (ref 0–32)
AST: 20 IU/L (ref 0–40)
Albumin: 4.6 g/dL (ref 3.9–4.9)
Alkaline Phosphatase: 102 IU/L (ref 49–135)
BUN/Creatinine Ratio: 15 (ref 12–28)
BUN: 16 mg/dL (ref 8–27)
Bilirubin Total: 0.9 mg/dL (ref 0.0–1.2)
CO2: 21 mmol/L (ref 20–29)
Calcium: 9.8 mg/dL (ref 8.7–10.3)
Chloride: 101 mmol/L (ref 96–106)
Creatinine, Ser: 1.05 mg/dL — ABNORMAL HIGH (ref 0.57–1.00)
Globulin, Total: 2.7 g/dL (ref 1.5–4.5)
Glucose: 119 mg/dL — ABNORMAL HIGH (ref 70–99)
Potassium: 4.7 mmol/L (ref 3.5–5.2)
Sodium: 139 mmol/L (ref 134–144)
Total Protein: 7.3 g/dL (ref 6.0–8.5)
eGFR: 58 mL/min/1.73 — ABNORMAL LOW (ref >59)

## 2023-11-11 LAB — T-HELPER CELLS (CD4) COUNT (NOT AT ARMC)
% CD 4 Pos. Lymph.: 37.4 % (ref 30.8–58.5)
Absolute CD 4 Helper: 823 /uL (ref 359–1519)
Basophils Absolute: 0.1 x10E3/uL (ref 0.0–0.2)
Basos: 1 %
EOS (ABSOLUTE): 0.2 x10E3/uL (ref 0.0–0.4)
Eos: 3 %
Hematocrit: 43.9 % (ref 34.0–46.6)
Hemoglobin: 13.9 g/dL (ref 11.1–15.9)
Immature Grans (Abs): 0 x10E3/uL (ref 0.0–0.1)
Immature Granulocytes: 0 %
Lymphocytes Absolute: 2.2 x10E3/uL (ref 0.7–3.1)
Lymphs: 31 %
MCH: 29.4 pg (ref 26.6–33.0)
MCHC: 31.7 g/dL (ref 31.5–35.7)
MCV: 93 fL (ref 79–97)
Monocytes Absolute: 0.6 x10E3/uL (ref 0.1–0.9)
Monocytes: 8 %
Neutrophils Absolute: 4 x10E3/uL (ref 1.4–7.0)
Neutrophils: 57 %
Platelets: 330 x10E3/uL (ref 150–450)
RBC: 4.72 x10E6/uL (ref 3.77–5.28)
RDW: 12.6 % (ref 11.7–15.4)
WBC: 7.1 x10E3/uL (ref 3.4–10.8)

## 2023-11-11 LAB — MICROALBUMIN / CREATININE URINE RATIO
Creatinine, Urine: 71.1 mg/dL
Microalb/Creat Ratio: 5 mg/g{creat} (ref 0–29)
Microalbumin, Urine: 3.8 ug/mL

## 2023-11-11 LAB — LIPID PANEL
Chol/HDL Ratio: 3 ratio (ref 0.0–4.4)
Cholesterol, Total: 140 mg/dL (ref 100–199)
HDL: 46 mg/dL (ref >39)
LDL Chol Calc (NIH): 80 mg/dL (ref 0–99)
Triglycerides: 68 mg/dL (ref 0–149)
VLDL Cholesterol Cal: 14 mg/dL (ref 5–40)

## 2023-11-11 LAB — HIV-1 RNA QUANT-NO REFLEX-BLD: HIV-1 RNA Viral Load: <20 {copies}/mL

## 2023-11-11 LAB — RPR: RPR Ser Ql: NONREACTIVE

## 2023-11-14 ENCOUNTER — Other Ambulatory Visit: Payer: Self-pay | Admitting: *Deleted

## 2023-11-14 DIAGNOSIS — E118 Type 2 diabetes mellitus with unspecified complications: Secondary | ICD-10-CM

## 2023-11-14 LAB — GC/CHLAMYDIA PROBE AMP
Chlamydia trachomatis, NAA: NEGATIVE
Neisseria Gonorrhoeae by PCR: NEGATIVE

## 2023-11-14 MED ORDER — OZEMPIC (2 MG/DOSE) 8 MG/3ML ~~LOC~~ SOPN
2.0000 mg | PEN_INJECTOR | SUBCUTANEOUS | 11 refills | Status: DC
Start: 2023-11-14 — End: 2023-12-20

## 2023-11-30 ENCOUNTER — Encounter: Admitting: Infectious Diseases

## 2023-12-07 ENCOUNTER — Other Ambulatory Visit: Payer: Self-pay

## 2023-12-07 DIAGNOSIS — E118 Type 2 diabetes mellitus with unspecified complications: Secondary | ICD-10-CM

## 2023-12-07 MED ORDER — ROSUVASTATIN CALCIUM 5 MG PO TABS: 5.0000 mg | ORAL_TABLET | Freq: Every day | ORAL | 3 refills | Status: DC

## 2023-12-07 NOTE — Telephone Encounter (Signed)
 Medication sent to pharmacy

## 2023-12-13 ENCOUNTER — Encounter: Admitting: Infectious Diseases

## 2023-12-14 DIAGNOSIS — M9904 Segmental and somatic dysfunction of sacral region: Secondary | ICD-10-CM | POA: Diagnosis not present

## 2023-12-14 DIAGNOSIS — M9905 Segmental and somatic dysfunction of pelvic region: Secondary | ICD-10-CM | POA: Diagnosis not present

## 2023-12-14 DIAGNOSIS — M5136 Other intervertebral disc degeneration, lumbar region with discogenic back pain only: Secondary | ICD-10-CM | POA: Diagnosis not present

## 2023-12-14 DIAGNOSIS — M9903 Segmental and somatic dysfunction of lumbar region: Secondary | ICD-10-CM | POA: Diagnosis not present

## 2023-12-20 ENCOUNTER — Encounter: Payer: Self-pay | Admitting: Infectious Diseases

## 2023-12-20 ENCOUNTER — Ambulatory Visit: Admitting: Infectious Diseases

## 2023-12-20 ENCOUNTER — Other Ambulatory Visit: Payer: Self-pay

## 2023-12-20 VITALS — BP 130/62 | HR 101 | Temp 98.5°F | Ht 65.0 in | Wt 251.2 lb

## 2023-12-20 DIAGNOSIS — Z79899 Other long term (current) drug therapy: Secondary | ICD-10-CM

## 2023-12-20 DIAGNOSIS — T424X1A Poisoning by benzodiazepines, accidental (unintentional), initial encounter: Secondary | ICD-10-CM

## 2023-12-20 DIAGNOSIS — Z0000 Encounter for general adult medical examination without abnormal findings: Secondary | ICD-10-CM

## 2023-12-20 DIAGNOSIS — E785 Hyperlipidemia, unspecified: Secondary | ICD-10-CM

## 2023-12-20 DIAGNOSIS — Z113 Encounter for screening for infections with a predominantly sexual mode of transmission: Secondary | ICD-10-CM

## 2023-12-20 DIAGNOSIS — Z833 Family history of diabetes mellitus: Secondary | ICD-10-CM

## 2023-12-20 DIAGNOSIS — E7849 Other hyperlipidemia: Secondary | ICD-10-CM

## 2023-12-20 DIAGNOSIS — F419 Anxiety disorder, unspecified: Secondary | ICD-10-CM | POA: Insufficient documentation

## 2023-12-20 DIAGNOSIS — E118 Type 2 diabetes mellitus with unspecified complications: Secondary | ICD-10-CM | POA: Diagnosis not present

## 2023-12-20 DIAGNOSIS — I10 Essential (primary) hypertension: Secondary | ICD-10-CM | POA: Diagnosis not present

## 2023-12-20 DIAGNOSIS — G4709 Other insomnia: Secondary | ICD-10-CM

## 2023-12-20 DIAGNOSIS — B20 Human immunodeficiency virus [HIV] disease: Secondary | ICD-10-CM

## 2023-12-20 LAB — POCT GLYCOSYLATED HEMOGLOBIN (HGB A1C): HbA1c, POC (controlled diabetic range): 6.1 % (ref 0.0–7.0)

## 2023-12-20 LAB — GLUCOSE, CAPILLARY: Glucose-Capillary: 112 mg/dL — ABNORMAL HIGH (ref 70–99)

## 2023-12-20 MED ORDER — LOSARTAN POTASSIUM 100 MG PO TABS
100.0000 mg | ORAL_TABLET | Freq: Every day | ORAL | 3 refills | Status: AC
  Filled 2024-02-28 – 2024-03-01 (×2): qty 90, 90d supply, fill #0

## 2023-12-20 MED ORDER — OZEMPIC (2 MG/DOSE) 8 MG/3ML ~~LOC~~ SOPN: 2.0000 mg | PEN_INJECTOR | SUBCUTANEOUS | 11 refills | Status: AC

## 2023-12-20 MED ORDER — ALPRAZOLAM 0.5 MG PO TABS: 0.5000 mg | ORAL_TABLET | Freq: Three times a day (TID) | ORAL | 0 refills | Status: AC | PRN

## 2023-12-20 MED ORDER — TEMAZEPAM 15 MG PO CAPS: ORAL_CAPSULE | ORAL | 3 refills | Status: DC

## 2023-12-20 MED ORDER — ODEFSEY 200-25-25 MG PO TABS: 1.0000 | ORAL_TABLET | Freq: Every day | ORAL | 5 refills | Status: AC

## 2023-12-20 MED ORDER — ROSUVASTATIN CALCIUM 5 MG PO TABS: 5.0000 mg | ORAL_TABLET | Freq: Every day | ORAL | 3 refills | Status: AC

## 2023-12-20 NOTE — Assessment & Plan Note (Signed)
 Continues on statin Lfts normal.     Latest Ref Rng & Units 11/10/2023    7:10 AM 05/02/2020   12:31 AM 04/30/2020    9:06 PM  Hepatic Function  Total Protein 6.0 - 8.5 g/dL 7.3  6.2  6.7   Albumin 3.9 - 4.9 g/dL 4.6  3.1  3.9   AST 0 - 40 IU/L 20  14  20    ALT 0 - 32 IU/L 20  15  17    Alk Phosphatase 49 - 135 IU/L 102  70  89   Total Bilirubin 0.0 - 1.2 mg/dL 0.9  0.6  1.1

## 2023-12-20 NOTE — Assessment & Plan Note (Signed)
 Well controlled on her current rx

## 2023-12-20 NOTE — Assessment & Plan Note (Signed)
 Gave her rx for xanax Discussed addiction potential Will limit

## 2023-12-20 NOTE — Assessment & Plan Note (Signed)
 A1C pending.  FSG good Continue ozempeic.

## 2023-12-20 NOTE — Assessment & Plan Note (Signed)
 She has gotten mammo this year, has repeat pending for next year Vax are uptodate Asks for xanax for anxiety. Discussed addiction potential No change in rx for now... Rtc in 12 months, labs prior (here). Exercising.

## 2023-12-20 NOTE — Addendum Note (Signed)
 Addended by: Russell Quinney C on: 12/20/2023 03:27 PM   Modules accepted: Level of Service

## 2023-12-20 NOTE — Progress Notes (Signed)
 Subjective:    Patient ID: Gail Edwards, female  DOB: [birth year], 68 y.o.        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  Has gotten IM PCP at [facility].  Labs done at [facility].  (01-11-23) CD4 728, HIV RNA und, Chol 126, HDL 46, LDL 64 .    Has had health maintenance updates- TC 10/10/15, flu this year, PNVx 8/18,  Mammo 11-2021 (normal)  Eye exam pending in Jan, done Jan 2024.  Her prev PCP stopped at 66.  Colonoscopy a couple of years ago don't ever have to have another one..  Bone density 11/20/15.    Had normal TSH 2018.    [place] is going well, no problems. Takes with food. Has 1 year of odefsey  in storage (expires 2025/26).  Got COVID, flu this fall, and RSV last fall. PCV 20/21 due at 68 yo.    She started on ozempic  Nov 2023. Has lost 22#. Regained 25#.  A1C 6.0% (10-04-22) Glc 119 today  Has worked 40 yrs at [facility], is retiring Dec 26 8am! Not stressed financially, but concerned about her change in lifestyle day-day.    Lives with son and his family (upstairs/downstairs).   HIV 1 RNA Quant (copies/mL)  Date Value  05/02/2020 <20  11/09/2011 <20  02/25/2011 <20   HIV-1 RNA Viral Load (copies/mL)  Date Value  11/10/2023 <20   CD4 T Cell Abs  Date Value  05/14/2020 517 /uL  05/02/2020 121 /uL (L)  11/09/2011 760 cmm     Health Maintenance  Topic Date Due  . Medicare Annual Wellness (AWV)  Never done  . Fecal DNA (Cologuard)  Never done  . Cervical Cancer Screening (Pap smear)  12/05/2019  . COLON CANCER SCREENING ANNUAL FOBT  07/15/2023  . FOOT EXAM  02/02/2024  . OPHTHALMOLOGY EXAM  03/03/2024  . COVID-19 Vaccine (8 - Pfizer risk 2025-26 season) 06/04/2024  . HEMOGLOBIN A1C  06/19/2024  . Diabetic kidney evaluation - eGFR measurement  11/09/2024  . Diabetic kidney evaluation - Urine ACR  11/09/2024  . Mammogram  12/20/2024  . Pneumococcal Vaccine: 50+ Years (4 of 4 - PCV20 or PCV21) 06/25/2025  . DTaP/Tdap/Td (3 - Td or Tdap)  10/09/2025  . Influenza Vaccine  Completed  . DEXA SCAN  Completed  . Hepatitis C Screening  Completed  . Zoster Vaccines- Shingrix   Completed  . Meningococcal B Vaccine  Aged Out  . Hepatitis B Vaccines 19-59 Average Risk  Discontinued  . Colonoscopy  Discontinued      Review of Systems  Constitutional:  Negative for chills, fever and weight loss.  Respiratory:  Negative for cough and shortness of breath.   Gastrointestinal:  Negative for constipation and diarrhea.  Genitourinary:  Negative for dysuria.  All other systems reviewed and are negative.   Please see HPI. All other systems reviewed and negative.     Objective:  Physical Exam Vitals reviewed.  Constitutional:      Appearance: Normal appearance. She is obese.  HENT:     Mouth/Throat:     Mouth: Mucous membranes are moist.     Pharynx: No oropharyngeal exudate.  Eyes:     Extraocular Movements: Extraocular movements intact.     Pupils: Pupils are equal, round, and reactive to light.  Cardiovascular:     Rate and Rhythm: Normal rate and regular rhythm.  Pulmonary:     Effort: Pulmonary effort is normal.  Breath sounds: Normal breath sounds.  Abdominal:     General: Bowel sounds are normal. There is no distension.     Palpations: Abdomen is soft.     Tenderness: There is no abdominal tenderness.  Musculoskeletal:        General: Normal range of motion.     Cervical back: Normal range of motion and neck supple.     Right lower leg: No edema.     Left lower leg: No edema.  Neurological:     General: No focal deficit present.     Mental Status: She is alert.           Assessment & Plan:

## 2023-12-22 ENCOUNTER — Ambulatory Visit

## 2023-12-29 ENCOUNTER — Ambulatory Visit
Admission: RE | Admit: 2023-12-29 | Discharge: 2023-12-29 | Disposition: A | Discharge status: Home / Self Care | Source: Ambulatory Visit | Attending: Infectious Diseases | Admitting: Infectious Diseases

## 2023-12-29 DIAGNOSIS — Z1231 Encounter for screening mammogram for malignant neoplasm of breast: Secondary | ICD-10-CM | POA: Diagnosis not present

## 2023-12-30 ENCOUNTER — Encounter: Payer: Self-pay | Admitting: Infectious Diseases

## 2024-01-13 NOTE — Progress Notes (Signed)
 Gail Edwards                                          MRN: [id number]   01/13/2024   The [facility]BCI Quality Team Specialist reviewed this patient medical record for the purposes of chart review for care gap closure. The following were reviewed: abstraction for care gap closure-glycemic status assessment.    VBCI Quality Team

## 2024-01-28 ENCOUNTER — Other Ambulatory Visit (HOSPITAL_BASED_OUTPATIENT_CLINIC_OR_DEPARTMENT_OTHER): Payer: Self-pay

## 2024-01-30 ENCOUNTER — Other Ambulatory Visit (HOSPITAL_BASED_OUTPATIENT_CLINIC_OR_DEPARTMENT_OTHER): Payer: Self-pay

## 2024-01-30 MED ORDER — OZEMPIC (2 MG/DOSE) 8 MG/3ML ~~LOC~~ SOPN
2.0000 mg | PEN_INJECTOR | SUBCUTANEOUS | 11 refills | Status: AC
  Filled 2024-02-09: qty 3, 28d supply, fill #0
  Filled 2024-03-01: qty 3, 28d supply, fill #1

## 2024-02-09 ENCOUNTER — Other Ambulatory Visit (HOSPITAL_BASED_OUTPATIENT_CLINIC_OR_DEPARTMENT_OTHER): Payer: Self-pay

## 2024-02-13 ENCOUNTER — Other Ambulatory Visit (HOSPITAL_COMMUNITY): Payer: Self-pay

## 2024-02-21 ENCOUNTER — Encounter: Payer: Self-pay | Admitting: Infectious Diseases

## 2024-02-21 ENCOUNTER — Telehealth: Payer: Self-pay

## 2024-02-21 NOTE — Telephone Encounter (Signed)
 Prior Authorization for patient (Ozempic  (2 MG/DOSE) 8MG /3ML pen-injectors) came through on cover my meds was submitted with last office notes and labs awaiting approval or denial.  [id number]

## 2024-02-22 NOTE — Telephone Encounter (Signed)
 Gail Edwards (Key: [id number]) Ozempic  (2 MG/DOSE) 8MG JONELLE pen-injectors Form [employer] Electronic Prior Authorization Form 2017 NCPDP Created 17 hours ago Sent to Plan 17 hours ago Plan Response 17 hours ago Submit Clinical Questions 17 hours ago Determination Favorable 14 hours ago Message from Plan 30-DEC-25:30-DEC-26 Ozempic  (2 MG/DOSE) 8MG /3ML [place] SOPN Quantity:2;   Lvm for patient regarding the approval.

## 2024-02-28 ENCOUNTER — Other Ambulatory Visit (HOSPITAL_BASED_OUTPATIENT_CLINIC_OR_DEPARTMENT_OTHER): Payer: Self-pay

## 2024-02-28 ENCOUNTER — Other Ambulatory Visit: Payer: Self-pay | Admitting: Pharmacy Technician

## 2024-02-28 ENCOUNTER — Other Ambulatory Visit: Payer: Self-pay

## 2024-02-28 MED ORDER — EMTRICITAB-RILPIVIR-TENOFOV AF 200-25-25 MG PO TABS
1.0000 | ORAL_TABLET | Freq: Every day | ORAL | 5 refills | Status: AC
  Filled 2024-02-28: qty 30, 30d supply, fill #0

## 2024-02-28 MED ORDER — ROSUVASTATIN CALCIUM 5 MG PO TABS
5.0000 mg | ORAL_TABLET | Freq: Every day | ORAL | 3 refills | Status: AC
  Filled 2024-02-28 – 2024-03-01 (×2): qty 90, 90d supply, fill #0

## 2024-02-28 MED ORDER — OZEMPIC (2 MG/DOSE) 8 MG/3ML ~~LOC~~ SOPN
2.0000 mg | PEN_INJECTOR | SUBCUTANEOUS | 11 refills | Status: AC
  Filled 2024-02-28: qty 3, 28d supply, fill #0

## 2024-02-28 NOTE — Progress Notes (Signed)
 Benefits investigation started

## 2024-02-28 NOTE — Progress Notes (Signed)
 Pharmacy Patient Advocate Encounter  Insurance verification completed.   The patient is insured through [facility] ADVANTAGE/RX ADVANCE   Ran test claim for Odefsey . Currently a quantity of 30 is a 30 day supply and the co-pay is $1,284. Patient is eligible for medicare payment plan  This test claim was processed through [facility]- copay amounts may vary at other pharmacies due to pharmacy/plan contracts, or as the patient moves through the different stages of their insurance plan.

## 2024-02-29 ENCOUNTER — Other Ambulatory Visit (HOSPITAL_BASED_OUTPATIENT_CLINIC_OR_DEPARTMENT_OTHER): Payer: Self-pay

## 2024-03-01 ENCOUNTER — Other Ambulatory Visit: Payer: Self-pay

## 2024-03-01 ENCOUNTER — Other Ambulatory Visit (HOSPITAL_BASED_OUTPATIENT_CLINIC_OR_DEPARTMENT_OTHER): Payer: Self-pay

## 2024-03-01 ENCOUNTER — Encounter: Payer: Self-pay | Admitting: Infectious Diseases

## 2024-03-01 DIAGNOSIS — G4709 Other insomnia: Secondary | ICD-10-CM

## 2024-03-01 MED ORDER — TEMAZEPAM 15 MG PO CAPS: ORAL_CAPSULE | ORAL | 3 refills | Status: DC

## 2024-03-01 NOTE — Telephone Encounter (Addendum)
 I was unable to send rx electronically to the pharmacy; sending to PCP. Thanks

## 2024-03-02 ENCOUNTER — Other Ambulatory Visit (HOSPITAL_BASED_OUTPATIENT_CLINIC_OR_DEPARTMENT_OTHER): Payer: Self-pay

## 2024-03-05 ENCOUNTER — Other Ambulatory Visit: Payer: Self-pay

## 2024-03-05 ENCOUNTER — Other Ambulatory Visit (HOSPITAL_BASED_OUTPATIENT_CLINIC_OR_DEPARTMENT_OTHER): Payer: Self-pay

## 2024-03-05 MED ORDER — TEMAZEPAM 15 MG PO CAPS
15.0000 mg | ORAL_CAPSULE | Freq: Every evening | ORAL | 3 refills | Status: AC | PRN
  Filled 2024-03-05 – 2024-03-20 (×3): qty 90, 90d supply, fill #0

## 2024-03-05 NOTE — Progress Notes (Signed)
 Patient has a a few months of medication on hand and does not need at this time. Dis-enrolling for now. She will call in if she decides to fill.

## 2024-03-20 ENCOUNTER — Other Ambulatory Visit: Payer: Self-pay

## 2024-03-20 ENCOUNTER — Other Ambulatory Visit (HOSPITAL_BASED_OUTPATIENT_CLINIC_OR_DEPARTMENT_OTHER): Payer: Self-pay

## 2024-03-21 ENCOUNTER — Other Ambulatory Visit (HOSPITAL_BASED_OUTPATIENT_CLINIC_OR_DEPARTMENT_OTHER): Payer: Self-pay
